# Patient Record
Sex: Male | Born: 1945
Health system: Southern US, Community
[De-identification: ages and names within clinical notes are randomized; demographics above are authoritative.]

## PROBLEM LIST (undated history)

## (undated) DIAGNOSIS — I10 Essential (primary) hypertension: Secondary | ICD-10-CM

## (undated) DIAGNOSIS — E782 Mixed hyperlipidemia: Secondary | ICD-10-CM

## (undated) DIAGNOSIS — M1990 Unspecified osteoarthritis, unspecified site: Secondary | ICD-10-CM

## (undated) DIAGNOSIS — G5603 Carpal tunnel syndrome, bilateral upper limbs: Secondary | ICD-10-CM

## (undated) DIAGNOSIS — J310 Chronic rhinitis: Secondary | ICD-10-CM

## (undated) DIAGNOSIS — Z9889 Other specified postprocedural states: Secondary | ICD-10-CM

## (undated) DIAGNOSIS — Z9989 Dependence on other enabling machines and devices: Secondary | ICD-10-CM

## (undated) DIAGNOSIS — J45909 Unspecified asthma, uncomplicated: Secondary | ICD-10-CM

## (undated) DIAGNOSIS — K5790 Diverticulosis of intestine, part unspecified, without perforation or abscess without bleeding: Secondary | ICD-10-CM

## (undated) DIAGNOSIS — L0390 Cellulitis, unspecified: Secondary | ICD-10-CM

## (undated) DIAGNOSIS — T783XXA Angioneurotic edema, initial encounter: Secondary | ICD-10-CM

## (undated) DIAGNOSIS — L409 Psoriasis, unspecified: Secondary | ICD-10-CM

## (undated) DIAGNOSIS — N183 Chronic kidney disease, stage 3 unspecified: Secondary | ICD-10-CM

## (undated) DIAGNOSIS — R972 Elevated prostate specific antigen [PSA]: Secondary | ICD-10-CM

## (undated) DIAGNOSIS — Z955 Presence of coronary angioplasty implant and graft: Secondary | ICD-10-CM

## (undated) DIAGNOSIS — C4490 Unspecified malignant neoplasm of skin, unspecified: Secondary | ICD-10-CM

## (undated) DIAGNOSIS — R3912 Poor urinary stream: Secondary | ICD-10-CM

## (undated) DIAGNOSIS — E785 Hyperlipidemia, unspecified: Secondary | ICD-10-CM

## (undated) DIAGNOSIS — E119 Type 2 diabetes mellitus without complications: Secondary | ICD-10-CM

## (undated) DIAGNOSIS — R600 Localized edema: Secondary | ICD-10-CM

## (undated) DIAGNOSIS — I878 Other specified disorders of veins: Secondary | ICD-10-CM

## (undated) DIAGNOSIS — G4733 Obstructive sleep apnea (adult) (pediatric): Secondary | ICD-10-CM

## (undated) DIAGNOSIS — L509 Urticaria, unspecified: Secondary | ICD-10-CM

## (undated) DIAGNOSIS — C4370 Malignant melanoma of unspecified lower limb, including hip: Secondary | ICD-10-CM

## (undated) DIAGNOSIS — C439 Malignant melanoma of skin, unspecified: Secondary | ICD-10-CM

## (undated) DIAGNOSIS — J3089 Other allergic rhinitis: Secondary | ICD-10-CM

## (undated) DIAGNOSIS — K5730 Diverticulosis of large intestine without perforation or abscess without bleeding: Secondary | ICD-10-CM

## (undated) DIAGNOSIS — E669 Obesity, unspecified: Secondary | ICD-10-CM

## (undated) DIAGNOSIS — I2510 Atherosclerotic heart disease of native coronary artery without angina pectoris: Secondary | ICD-10-CM

## (undated) DIAGNOSIS — G4730 Sleep apnea, unspecified: Secondary | ICD-10-CM

## (undated) DIAGNOSIS — N401 Enlarged prostate with lower urinary tract symptoms: Secondary | ICD-10-CM

## (undated) DIAGNOSIS — K52839 Microscopic colitis, unspecified: Secondary | ICD-10-CM

## (undated) DIAGNOSIS — K439 Ventral hernia without obstruction or gangrene: Secondary | ICD-10-CM

## (undated) DIAGNOSIS — E039 Hypothyroidism, unspecified: Secondary | ICD-10-CM

## (undated) DIAGNOSIS — R339 Retention of urine, unspecified: Secondary | ICD-10-CM

## (undated) DIAGNOSIS — J302 Other seasonal allergic rhinitis: Secondary | ICD-10-CM

## (undated) HISTORY — DX: Cellulitis, unspecified: L03.90

## (undated) HISTORY — DX: Sleep apnea, unspecified: G47.30

## (undated) HISTORY — DX: Atherosclerotic heart disease of native coronary artery without angina pectoris: I25.10

## (undated) HISTORY — DX: Other seasonal allergic rhinitis: J30.2

## (undated) HISTORY — PX: SINOSCOPY: SHX187

## (undated) HISTORY — PX: SHOULDER SURGERY: SHX246

## (undated) HISTORY — DX: Obstructive sleep apnea (adult) (pediatric): G47.33

## (undated) HISTORY — PX: NASAL SEPTUM SURGERY: SHX37

## (undated) HISTORY — PX: ADENOIDECTOMY: SUR15

## (undated) HISTORY — DX: Angioneurotic edema, initial encounter: T78.3XXA

## (undated) HISTORY — PX: TONSILLECTOMY AND ADENOIDECTOMY: SUR1326

## (undated) HISTORY — PX: KNEE SURGERY: SHX244

## (undated) HISTORY — DX: Hyperlipidemia, unspecified: E78.5

## (undated) HISTORY — DX: Other specified postprocedural states: Z98.890

## (undated) HISTORY — DX: Obesity, unspecified: E66.9

## (undated) HISTORY — PX: PROSTATE BIOPSY: SHX241

## (undated) HISTORY — PX: CORONARY ANGIOPLASTY: SHX604

## (undated) HISTORY — PX: PENILE PROSTHESIS IMPLANT: SHX240

## (undated) HISTORY — DX: Malignant melanoma of skin, unspecified: C43.9

## (undated) HISTORY — DX: Diverticulosis of intestine, part unspecified, without perforation or abscess without bleeding: K57.90

## (undated) HISTORY — DX: Essential (primary) hypertension: I10

## (undated) HISTORY — PX: APPENDECTOMY: SHX54

## (undated) HISTORY — PX: TYMPANOSTOMY TUBE PLACEMENT: SHX32

## (undated) HISTORY — DX: Urticaria, unspecified: L50.9

## (undated) HISTORY — DX: Unspecified asthma, uncomplicated: J45.909

## (undated) HISTORY — DX: Malignant melanoma of unspecified lower limb, including hip: C43.70

## (undated) HISTORY — DX: Psoriasis, unspecified: L40.9

## (undated) HISTORY — DX: Dependence on other enabling machines and devices: Z99.89

## (undated) HISTORY — DX: Microscopic colitis, unspecified: K52.839

## (undated) HISTORY — PX: TONSILLECTOMY: SUR1361

## (undated) HISTORY — PX: ABDOMINAL SURGERY: SHX537

## (undated) HISTORY — PX: KNEE ARTHROSCOPY: SUR90

---

## 1994-05-05 HISTORY — PX: PENILE PROSTHESIS IMPLANT: SHX240

## 1999-05-06 DIAGNOSIS — I252 Old myocardial infarction: Secondary | ICD-10-CM

## 1999-05-06 DIAGNOSIS — I2510 Atherosclerotic heart disease of native coronary artery without angina pectoris: Secondary | ICD-10-CM

## 1999-05-06 HISTORY — DX: Old myocardial infarction: I25.2

## 1999-05-06 HISTORY — DX: Atherosclerotic heart disease of native coronary artery without angina pectoris: I25.10

## 2000-03-05 ENCOUNTER — Ambulatory Visit (HOSPITAL_COMMUNITY): Admission: RE | Admit: 2000-03-05 | Discharge: 2000-03-06 | Payer: Self-pay | Admitting: Cardiovascular Disease

## 2000-03-05 HISTORY — PX: CORONARY ANGIOPLASTY WITH STENT PLACEMENT: SHX49

## 2000-09-08 ENCOUNTER — Ambulatory Visit (HOSPITAL_COMMUNITY): Admission: RE | Admit: 2000-09-08 | Discharge: 2000-09-08 | Payer: Self-pay | Admitting: Family Medicine

## 2000-09-08 ENCOUNTER — Encounter: Payer: Self-pay | Admitting: Family Medicine

## 2001-10-04 ENCOUNTER — Encounter: Payer: Self-pay | Admitting: Family Medicine

## 2001-10-04 ENCOUNTER — Ambulatory Visit (HOSPITAL_COMMUNITY): Admission: RE | Admit: 2001-10-04 | Discharge: 2001-10-04 | Payer: Self-pay | Admitting: Family Medicine

## 2001-10-16 ENCOUNTER — Emergency Department (HOSPITAL_COMMUNITY): Admission: EM | Admit: 2001-10-16 | Discharge: 2001-10-16 | Payer: Self-pay | Admitting: Emergency Medicine

## 2002-05-05 HISTORY — PX: SHOULDER SURGERY: SHX246

## 2002-05-08 ENCOUNTER — Emergency Department (HOSPITAL_COMMUNITY): Admission: EM | Admit: 2002-05-08 | Discharge: 2002-05-08 | Payer: Self-pay | Admitting: Emergency Medicine

## 2002-07-05 ENCOUNTER — Ambulatory Visit (HOSPITAL_COMMUNITY): Admission: RE | Admit: 2002-07-05 | Discharge: 2002-07-05 | Payer: Self-pay | Admitting: Family Medicine

## 2002-07-05 ENCOUNTER — Encounter: Payer: Self-pay | Admitting: Family Medicine

## 2002-08-12 ENCOUNTER — Ambulatory Visit (HOSPITAL_COMMUNITY): Admission: RE | Admit: 2002-08-12 | Discharge: 2002-08-12 | Payer: Self-pay | Admitting: Family Medicine

## 2002-08-12 ENCOUNTER — Encounter: Payer: Self-pay | Admitting: Family Medicine

## 2002-08-20 ENCOUNTER — Emergency Department (HOSPITAL_COMMUNITY): Admission: EM | Admit: 2002-08-20 | Discharge: 2002-08-20 | Payer: Self-pay | Admitting: Internal Medicine

## 2002-08-21 ENCOUNTER — Emergency Department (HOSPITAL_COMMUNITY): Admission: EM | Admit: 2002-08-21 | Discharge: 2002-08-21 | Payer: Self-pay | Admitting: Internal Medicine

## 2002-08-22 ENCOUNTER — Emergency Department (HOSPITAL_COMMUNITY): Admission: EM | Admit: 2002-08-22 | Discharge: 2002-08-22 | Payer: Self-pay | Admitting: Emergency Medicine

## 2002-09-02 ENCOUNTER — Encounter: Payer: Self-pay | Admitting: Family Medicine

## 2002-09-02 ENCOUNTER — Ambulatory Visit (HOSPITAL_COMMUNITY): Admission: RE | Admit: 2002-09-02 | Discharge: 2002-09-02 | Payer: Self-pay | Admitting: Family Medicine

## 2002-09-04 ENCOUNTER — Emergency Department (HOSPITAL_COMMUNITY): Admission: EM | Admit: 2002-09-04 | Discharge: 2002-09-04 | Payer: Self-pay | Admitting: Emergency Medicine

## 2002-09-06 ENCOUNTER — Inpatient Hospital Stay (HOSPITAL_COMMUNITY): Admission: RE | Admit: 2002-09-06 | Discharge: 2002-09-12 | Payer: Self-pay | Admitting: General Surgery

## 2002-09-13 ENCOUNTER — Ambulatory Visit (HOSPITAL_COMMUNITY): Admission: RE | Admit: 2002-09-13 | Discharge: 2002-09-13 | Payer: Self-pay | Admitting: Internal Medicine

## 2002-09-13 ENCOUNTER — Encounter (HOSPITAL_COMMUNITY): Admission: RE | Admit: 2002-09-13 | Discharge: 2002-10-13 | Payer: Self-pay | Admitting: General Surgery

## 2002-10-14 ENCOUNTER — Encounter (HOSPITAL_COMMUNITY): Admission: RE | Admit: 2002-10-14 | Discharge: 2002-11-13 | Payer: Self-pay | Admitting: General Surgery

## 2002-10-18 ENCOUNTER — Encounter: Payer: Self-pay | Admitting: General Surgery

## 2002-10-18 ENCOUNTER — Ambulatory Visit (HOSPITAL_COMMUNITY): Admission: RE | Admit: 2002-10-18 | Discharge: 2002-10-18 | Payer: Self-pay | Admitting: General Surgery

## 2002-11-24 ENCOUNTER — Encounter: Payer: Self-pay | Admitting: Family Medicine

## 2002-11-24 ENCOUNTER — Ambulatory Visit (HOSPITAL_COMMUNITY): Admission: RE | Admit: 2002-11-24 | Discharge: 2002-11-24 | Payer: Self-pay | Admitting: Family Medicine

## 2002-12-13 ENCOUNTER — Ambulatory Visit (HOSPITAL_COMMUNITY): Admission: RE | Admit: 2002-12-13 | Discharge: 2002-12-13 | Payer: Self-pay | Admitting: Orthopedic Surgery

## 2002-12-13 ENCOUNTER — Encounter: Payer: Self-pay | Admitting: Orthopedic Surgery

## 2003-02-14 ENCOUNTER — Ambulatory Visit (HOSPITAL_COMMUNITY): Admission: RE | Admit: 2003-02-14 | Discharge: 2003-02-14 | Payer: Self-pay | Admitting: Orthopedic Surgery

## 2003-02-20 ENCOUNTER — Encounter (HOSPITAL_COMMUNITY): Admission: RE | Admit: 2003-02-20 | Discharge: 2003-03-22 | Payer: Self-pay | Admitting: Orthopedic Surgery

## 2003-04-05 HISTORY — PX: CORONARY ANGIOPLASTY WITH STENT PLACEMENT: SHX49

## 2003-04-20 ENCOUNTER — Encounter: Payer: Self-pay | Admitting: Emergency Medicine

## 2003-04-20 ENCOUNTER — Inpatient Hospital Stay (HOSPITAL_COMMUNITY): Admission: AD | Admit: 2003-04-20 | Discharge: 2003-04-25 | Payer: Self-pay | Admitting: Cardiology

## 2003-07-03 ENCOUNTER — Ambulatory Visit (HOSPITAL_COMMUNITY): Admission: RE | Admit: 2003-07-03 | Discharge: 2003-07-03 | Payer: Self-pay | Admitting: Internal Medicine

## 2003-07-03 DIAGNOSIS — Z9889 Other specified postprocedural states: Secondary | ICD-10-CM

## 2003-07-03 HISTORY — DX: Other specified postprocedural states: Z98.890

## 2004-08-01 ENCOUNTER — Ambulatory Visit: Payer: Self-pay | Admitting: *Deleted

## 2004-08-15 ENCOUNTER — Ambulatory Visit: Payer: Self-pay | Admitting: *Deleted

## 2004-08-25 ENCOUNTER — Emergency Department (HOSPITAL_COMMUNITY): Admission: EM | Admit: 2004-08-25 | Discharge: 2004-08-25 | Payer: Self-pay | Admitting: Emergency Medicine

## 2004-08-29 ENCOUNTER — Ambulatory Visit: Payer: Self-pay | Admitting: Cardiology

## 2004-08-29 ENCOUNTER — Encounter (HOSPITAL_COMMUNITY): Admission: RE | Admit: 2004-08-29 | Discharge: 2004-08-30 | Payer: Self-pay | Admitting: *Deleted

## 2004-09-04 ENCOUNTER — Ambulatory Visit: Payer: Self-pay | Admitting: *Deleted

## 2004-09-13 ENCOUNTER — Observation Stay (HOSPITAL_COMMUNITY): Admission: RE | Admit: 2004-09-13 | Discharge: 2004-09-13 | Payer: Self-pay | Admitting: Specialist

## 2005-05-05 HISTORY — PX: CATARACT EXTRACTION W/ INTRAOCULAR LENS IMPLANT: SHX1309

## 2005-10-11 ENCOUNTER — Emergency Department (HOSPITAL_COMMUNITY): Admission: EM | Admit: 2005-10-11 | Discharge: 2005-10-11 | Payer: Self-pay | Admitting: Emergency Medicine

## 2006-06-10 ENCOUNTER — Ambulatory Visit (HOSPITAL_COMMUNITY): Admission: RE | Admit: 2006-06-10 | Discharge: 2006-06-10 | Payer: Self-pay | Admitting: Internal Medicine

## 2006-09-18 ENCOUNTER — Observation Stay (HOSPITAL_COMMUNITY): Admission: RE | Admit: 2006-09-18 | Discharge: 2006-09-19 | Payer: Self-pay | Admitting: Otolaryngology

## 2006-09-18 ENCOUNTER — Encounter (INDEPENDENT_AMBULATORY_CARE_PROVIDER_SITE_OTHER): Payer: Self-pay | Admitting: Otolaryngology

## 2006-09-18 HISTORY — PX: NASAL/SINUS ENDOSCOPY: SHX288

## 2006-09-20 ENCOUNTER — Emergency Department (HOSPITAL_COMMUNITY): Admission: EM | Admit: 2006-09-20 | Discharge: 2006-09-20 | Payer: Self-pay | Admitting: Emergency Medicine

## 2007-05-06 HISTORY — PX: LAPAROSCOPIC GASTRIC BANDING: SHX1100

## 2007-06-11 ENCOUNTER — Ambulatory Visit (HOSPITAL_COMMUNITY): Admission: RE | Admit: 2007-06-11 | Discharge: 2007-06-12 | Payer: Self-pay | Admitting: Cardiology

## 2007-06-11 HISTORY — PX: CORONARY ANGIOPLASTY WITH STENT PLACEMENT: SHX49

## 2008-08-09 ENCOUNTER — Ambulatory Visit (HOSPITAL_COMMUNITY): Admission: RE | Admit: 2008-08-09 | Discharge: 2008-08-09 | Payer: Self-pay | Admitting: Family Medicine

## 2008-08-13 ENCOUNTER — Emergency Department (HOSPITAL_COMMUNITY): Admission: EM | Admit: 2008-08-13 | Discharge: 2008-08-14 | Payer: Self-pay | Admitting: Emergency Medicine

## 2008-09-12 ENCOUNTER — Encounter (INDEPENDENT_AMBULATORY_CARE_PROVIDER_SITE_OTHER): Payer: Self-pay | Admitting: *Deleted

## 2009-08-01 ENCOUNTER — Ambulatory Visit (HOSPITAL_COMMUNITY): Admission: RE | Admit: 2009-08-01 | Discharge: 2009-08-01 | Payer: Self-pay | Admitting: Internal Medicine

## 2009-09-02 DIAGNOSIS — L0390 Cellulitis, unspecified: Secondary | ICD-10-CM

## 2009-09-02 HISTORY — DX: Cellulitis, unspecified: L03.90

## 2009-09-24 ENCOUNTER — Telehealth (INDEPENDENT_AMBULATORY_CARE_PROVIDER_SITE_OTHER): Payer: Self-pay

## 2009-10-02 ENCOUNTER — Encounter (INDEPENDENT_AMBULATORY_CARE_PROVIDER_SITE_OTHER): Payer: Self-pay | Admitting: Internal Medicine

## 2009-10-03 ENCOUNTER — Encounter (INDEPENDENT_AMBULATORY_CARE_PROVIDER_SITE_OTHER): Payer: Self-pay | Admitting: Internal Medicine

## 2009-10-03 ENCOUNTER — Ambulatory Visit: Payer: Self-pay | Admitting: Vascular Surgery

## 2010-02-02 DIAGNOSIS — Z85820 Personal history of malignant melanoma of skin: Secondary | ICD-10-CM

## 2010-02-02 DIAGNOSIS — C439 Malignant melanoma of skin, unspecified: Secondary | ICD-10-CM

## 2010-02-02 HISTORY — DX: Malignant melanoma of skin, unspecified: C43.9

## 2010-02-02 HISTORY — DX: Personal history of malignant melanoma of skin: Z85.820

## 2010-02-18 ENCOUNTER — Ambulatory Visit (HOSPITAL_COMMUNITY): Admission: RE | Admit: 2010-02-18 | Discharge: 2010-02-18 | Payer: Self-pay | Admitting: General Surgery

## 2010-02-18 HISTORY — PX: MELANOMA EXCISION: SHX5266

## 2010-04-10 ENCOUNTER — Ambulatory Visit: Payer: Self-pay | Admitting: Hematology & Oncology

## 2010-04-11 ENCOUNTER — Inpatient Hospital Stay (HOSPITAL_COMMUNITY): Admission: EM | Admit: 2010-04-11 | Discharge: 2009-10-05 | Payer: Self-pay | Admitting: Emergency Medicine

## 2010-05-15 ENCOUNTER — Ambulatory Visit: Payer: Self-pay | Admitting: Hematology & Oncology

## 2010-05-17 LAB — COMPREHENSIVE METABOLIC PANEL
ALT: 19 U/L (ref 0–53)
AST: 19 U/L (ref 0–37)
Albumin: 5 g/dL (ref 3.5–5.2)
Alkaline Phosphatase: 46 U/L (ref 39–117)
BUN: 18 mg/dL (ref 6–23)
CO2: 25 mEq/L (ref 19–32)
Calcium: 9.9 mg/dL (ref 8.4–10.5)
Chloride: 101 mEq/L (ref 96–112)
Creatinine, Ser: 1.24 mg/dL (ref 0.40–1.50)
Glucose, Bld: 145 mg/dL — ABNORMAL HIGH (ref 70–99)
Potassium: 3.9 mEq/L (ref 3.5–5.3)
Sodium: 142 mEq/L (ref 135–145)
Total Bilirubin: 0.5 mg/dL (ref 0.3–1.2)
Total Protein: 7.1 g/dL (ref 6.0–8.3)

## 2010-05-17 LAB — LACTATE DEHYDROGENASE: LDH: 160 U/L (ref 94–250)

## 2010-06-06 NOTE — Progress Notes (Signed)
Summary: refills/needs appt.  Phone Note Outgoing Call   Call placed by: Larita Fife Via LPN,  Sep 24, 2009 2:04 PM Summary of Call: We received refill request from [facility] for Niaspan 1000mg  (take 2 tablets po qhs). Pt. has not been seen here since 2006. LMOM for pt. to call office.  Follow-up for Phone Call        [facility]. Follow-up by: Larita Fife Via LPN,  Sep 25, 2009 8:54 AM    Additional Follow-up for Phone Call Additional follow up Details #2::    Pharmacist at [facility] Homero Fellers, advised that we cant refill pt's meds until he has an OV. Pharmacy will advise pt. when he comes to pick RX up. Follow-up by: Larita Fife Via LPN,  Sep 26, 2009 9:17 AM

## 2010-06-13 ENCOUNTER — Ambulatory Visit (HOSPITAL_COMMUNITY)
Admission: RE | Admit: 2010-06-13 | Discharge: 2010-06-13 | Disposition: A | Discharge status: Home / Self Care | Payer: BC Managed Care – PPO | Source: Ambulatory Visit | Attending: Cardiology | Admitting: Cardiology

## 2010-06-13 ENCOUNTER — Other Ambulatory Visit (HOSPITAL_COMMUNITY): Payer: Self-pay | Admitting: Cardiology

## 2010-06-13 DIAGNOSIS — M79609 Pain in unspecified limb: Secondary | ICD-10-CM | POA: Insufficient documentation

## 2010-06-13 DIAGNOSIS — L539 Erythematous condition, unspecified: Secondary | ICD-10-CM

## 2010-06-13 DIAGNOSIS — R609 Edema, unspecified: Secondary | ICD-10-CM

## 2010-06-13 DIAGNOSIS — M7989 Other specified soft tissue disorders: Secondary | ICD-10-CM | POA: Insufficient documentation

## 2010-06-13 DIAGNOSIS — R52 Pain, unspecified: Secondary | ICD-10-CM

## 2010-07-17 LAB — BASIC METABOLIC PANEL
BUN: 16 mg/dL (ref 6–23)
CO2: 29 mEq/L (ref 19–32)
Chloride: 104 mEq/L (ref 96–112)
GFR calc Af Amer: >60 mL/min (ref >60)
Potassium: 3.8 mEq/L (ref 3.5–5.1)
Sodium: 142 mEq/L (ref 135–145)

## 2010-07-17 LAB — GLUCOSE, CAPILLARY
Glucose-Capillary: 165 mg/dL — ABNORMAL HIGH (ref 70–99)
Glucose-Capillary: 169 mg/dL — ABNORMAL HIGH (ref 70–99)

## 2010-07-17 LAB — CBC
HCT: 37.8 % — ABNORMAL LOW (ref 39.0–52.0)
MCV: 94.5 fL (ref 78.0–100.0)
Platelets: 186 10*3/uL (ref 150–400)

## 2010-07-17 LAB — SURGICAL PCR SCREEN: MRSA, PCR: NEGATIVE

## 2010-07-22 ENCOUNTER — Emergency Department (HOSPITAL_COMMUNITY)
Admission: EM | Admit: 2010-07-22 | Discharge: 2010-07-23 | Disposition: A | Discharge status: Home / Self Care | Payer: BC Managed Care – PPO | Attending: Emergency Medicine | Admitting: Emergency Medicine

## 2010-07-22 ENCOUNTER — Emergency Department (HOSPITAL_COMMUNITY): Payer: BC Managed Care – PPO

## 2010-07-22 DIAGNOSIS — Z79899 Other long term (current) drug therapy: Secondary | ICD-10-CM | POA: Insufficient documentation

## 2010-07-22 DIAGNOSIS — E119 Type 2 diabetes mellitus without complications: Secondary | ICD-10-CM | POA: Insufficient documentation

## 2010-07-22 DIAGNOSIS — J45909 Unspecified asthma, uncomplicated: Secondary | ICD-10-CM | POA: Insufficient documentation

## 2010-07-22 DIAGNOSIS — I10 Essential (primary) hypertension: Secondary | ICD-10-CM | POA: Insufficient documentation

## 2010-07-22 DIAGNOSIS — I2510 Atherosclerotic heart disease of native coronary artery without angina pectoris: Secondary | ICD-10-CM | POA: Insufficient documentation

## 2010-07-22 DIAGNOSIS — R112 Nausea with vomiting, unspecified: Secondary | ICD-10-CM | POA: Insufficient documentation

## 2010-07-22 DIAGNOSIS — R109 Unspecified abdominal pain: Secondary | ICD-10-CM | POA: Insufficient documentation

## 2010-07-22 LAB — CBC
HCT: 32.1 % — ABNORMAL LOW (ref 39.0–52.0)
HCT: 37.2 % — ABNORMAL LOW (ref 39.0–52.0)
HCT: 37 % — ABNORMAL LOW (ref 39.0–52.0)
HCT: 39 % (ref 39.0–52.0)
HCT: 39.3 % (ref 39.0–52.0)
Hemoglobin: 11 g/dL — ABNORMAL LOW (ref 13.0–17.0)
Hemoglobin: 12.6 g/dL — ABNORMAL LOW (ref 13.0–17.0)
Hemoglobin: 13.6 g/dL (ref 13.0–17.0)
Hemoglobin: 14.8 g/dL (ref 13.0–17.0)
Hemoglobin: 15.7 g/dL (ref 13.0–17.0)
MCH: 33 pg (ref 26.0–34.0)
MCHC: 34.2 g/dL (ref 30.0–36.0)
MCHC: 34.4 g/dL (ref 30.0–36.0)
MCHC: 34.6 g/dL (ref 30.0–36.0)
MCV: 97.6 fL (ref 78.0–100.0)
MCV: 97.6 fL (ref 78.0–100.0)
MCV: 98.4 fL (ref 78.0–100.0)
MCV: 97.8 fL (ref 78.0–100.0)
MCV: 95.2 fL (ref 78.0–100.0)
Platelets: 178 10*3/uL (ref 150–400)
Platelets: 190 10*3/uL (ref 150–400)
Platelets: 143 10*3/uL — ABNORMAL LOW (ref 150–400)
Platelets: 157 10*3/uL (ref 150–400)
Platelets: 161 10*3/uL (ref 150–400)
RBC: 3.29 MIL/uL — ABNORMAL LOW (ref 4.22–5.81)
RBC: 3.79 MIL/uL — ABNORMAL LOW (ref 4.22–5.81)
RBC: 4.76 MIL/uL (ref 4.22–5.81)
RDW: 14.1 % (ref 11.5–15.5)
RDW: 13.3 % (ref 11.5–15.5)
RDW: 13.6 % (ref 11.5–15.5)
RDW: 14 % (ref 11.5–15.5)
WBC: 7.9 10*3/uL (ref 4.0–10.5)
WBC: 9.1 10*3/uL (ref 4.0–10.5)
WBC: 8.3 10*3/uL (ref 4.0–10.5)
WBC: 6.7 10*3/uL (ref 4.0–10.5)

## 2010-07-22 LAB — MRSA PCR SCREENING: MRSA by PCR: POSITIVE — AB

## 2010-07-22 LAB — GLUCOSE, CAPILLARY
Glucose-Capillary: 200 mg/dL — ABNORMAL HIGH (ref 70–99)
Glucose-Capillary: 209 mg/dL — ABNORMAL HIGH (ref 70–99)
Glucose-Capillary: 210 mg/dL — ABNORMAL HIGH (ref 70–99)
Glucose-Capillary: 217 mg/dL — ABNORMAL HIGH (ref 70–99)
Glucose-Capillary: 250 mg/dL — ABNORMAL HIGH (ref 70–99)
Glucose-Capillary: 251 mg/dL — ABNORMAL HIGH (ref 70–99)
Glucose-Capillary: 311 mg/dL — ABNORMAL HIGH (ref 70–99)
Glucose-Capillary: 342 mg/dL — ABNORMAL HIGH (ref 70–99)
Glucose-Capillary: 201 mg/dL — ABNORMAL HIGH (ref 70–99)
Glucose-Capillary: 206 mg/dL — ABNORMAL HIGH (ref 70–99)
Glucose-Capillary: 208 mg/dL — ABNORMAL HIGH (ref 70–99)
Glucose-Capillary: 244 mg/dL — ABNORMAL HIGH (ref 70–99)
Glucose-Capillary: 362 mg/dL — ABNORMAL HIGH (ref 70–99)

## 2010-07-22 LAB — COMPREHENSIVE METABOLIC PANEL
ALT: 27 U/L (ref 0–53)
AST: 22 U/L (ref 0–37)
AST: 27 U/L (ref 0–37)
AST: 22 U/L (ref 0–37)
Albumin: 2.6 g/dL — ABNORMAL LOW (ref 3.5–5.2)
Albumin: 4 g/dL (ref 3.5–5.2)
Alkaline Phosphatase: 50 U/L (ref 39–117)
Alkaline Phosphatase: 46 U/L (ref 39–117)
BUN: 25 mg/dL — ABNORMAL HIGH (ref 6–23)
CO2: 31 mEq/L (ref 19–32)
CO2: 23 mEq/L (ref 19–32)
Calcium: 8.8 mg/dL (ref 8.4–10.5)
Calcium: 9.5 mg/dL (ref 8.4–10.5)
Chloride: 100 mEq/L (ref 96–112)
Chloride: 101 mEq/L (ref 96–112)
Creatinine, Ser: 1.07 mg/dL (ref 0.4–1.5)
Creatinine, Ser: 1.12 mg/dL (ref 0.4–1.5)
Creatinine, Ser: 1.39 mg/dL (ref 0.4–1.5)
GFR calc Af Amer: >60 mL/min (ref >60)
GFR calc Af Amer: >60 mL/min (ref >60)
GFR calc Af Amer: >60 mL/min (ref >60)
GFR calc non Af Amer: >60 mL/min (ref >60)
GFR calc non Af Amer: 52 mL/min — ABNORMAL LOW (ref >60)
Glucose, Bld: 228 mg/dL — ABNORMAL HIGH (ref 70–99)
Potassium: 3.7 mEq/L (ref 3.5–5.1)
Potassium: 4 mEq/L (ref 3.5–5.1)
Potassium: 4.2 mEq/L (ref 3.5–5.1)
Sodium: 137 mEq/L (ref 135–145)
Total Bilirubin: 0.9 mg/dL (ref 0.3–1.2)
Total Bilirubin: 1 mg/dL (ref 0.3–1.2)
Total Protein: 6.7 g/dL (ref 6.0–8.3)

## 2010-07-22 LAB — URINALYSIS, ROUTINE W REFLEX MICROSCOPIC
Bilirubin Urine: NEGATIVE
Bilirubin Urine: NEGATIVE
Glucose, UA: 500 mg/dL — AB
Glucose, UA: 500 mg/dL — AB
Glucose, UA: >1000 mg/dL — AB
Hgb urine dipstick: NEGATIVE
Ketones, ur: NEGATIVE mg/dL
Nitrite: NEGATIVE
Protein, ur: NEGATIVE mg/dL
Protein, ur: NEGATIVE mg/dL
pH: 5 (ref 5.0–8.0)
pH: 5 (ref 5.0–8.0)
pH: 5.5 (ref 5.0–8.0)

## 2010-07-22 LAB — BASIC METABOLIC PANEL
BUN: 12 mg/dL (ref 6–23)
BUN: 12 mg/dL (ref 6–23)
CO2: 29 mEq/L (ref 19–32)
Calcium: 8.7 mg/dL (ref 8.4–10.5)
Chloride: 98 mEq/L (ref 96–112)
Chloride: 98 mEq/L (ref 96–112)
GFR calc Af Amer: >60 mL/min (ref >60)
GFR calc non Af Amer: 57 mL/min — ABNORMAL LOW (ref >60)
GFR calc non Af Amer: >60 mL/min (ref >60)
Glucose, Bld: 289 mg/dL — ABNORMAL HIGH (ref 70–99)
Glucose, Bld: 219 mg/dL — ABNORMAL HIGH (ref 70–99)
Potassium: 3.6 mEq/L (ref 3.5–5.1)
Potassium: 3.5 mEq/L (ref 3.5–5.1)
Potassium: 3.5 mEq/L (ref 3.5–5.1)
Sodium: 136 mEq/L (ref 135–145)
Sodium: 136 mEq/L (ref 135–145)

## 2010-07-22 LAB — DIFFERENTIAL
Basophils Absolute: 0 10*3/uL (ref 0.0–0.1)
Basophils Absolute: 0 10*3/uL (ref 0.0–0.1)
Basophils Absolute: 0 10*3/uL (ref 0.0–0.1)
Basophils Relative: 0 % (ref 0–1)
Basophils Relative: 0 % (ref 0–1)
Eosinophils Absolute: 0.1 10*3/uL (ref 0.0–0.7)
Eosinophils Absolute: 0 10*3/uL (ref 0.0–0.7)
Eosinophils Relative: 2 % (ref 0–5)
Eosinophils Relative: 0 % (ref 0–5)
Eosinophils Relative: 1 % (ref 0–5)
Lymphocytes Relative: 13 % (ref 12–46)
Lymphocytes Relative: 15 % (ref 12–46)
Lymphocytes Relative: 10 % — ABNORMAL LOW (ref 12–46)
Lymphocytes Relative: 11 % — ABNORMAL LOW (ref 12–46)
Lymphocytes Relative: 3 % — ABNORMAL LOW (ref 12–46)
Lymphs Abs: 1 10*3/uL (ref 0.7–4.0)
Lymphs Abs: 1 10*3/uL (ref 0.7–4.0)
Lymphs Abs: 0.5 10*3/uL — ABNORMAL LOW (ref 0.7–4.0)
Lymphs Abs: 0.9 10*3/uL (ref 0.7–4.0)
Lymphs Abs: 0.2 10*3/uL — ABNORMAL LOW (ref 0.7–4.0)
Monocytes Absolute: 0.8 10*3/uL (ref 0.1–1.0)
Monocytes Absolute: 0.5 10*3/uL (ref 0.1–1.0)
Monocytes Absolute: 0.6 10*3/uL (ref 0.1–1.0)
Monocytes Relative: 6 % (ref 3–12)
Monocytes Relative: 5 % (ref 3–12)
Neutro Abs: 6.6 10*3/uL (ref 1.7–7.7)
Neutro Abs: 6.1 10*3/uL (ref 1.7–7.7)
Neutrophils Relative %: 75 % (ref 43–77)
Neutrophils Relative %: 79 % — ABNORMAL HIGH (ref 43–77)
Neutrophils Relative %: 91 % — ABNORMAL HIGH (ref 43–77)

## 2010-07-22 LAB — POCT CARDIAC MARKERS
CKMB, poc: <1 ng/mL — ABNORMAL LOW (ref 1.0–8.0)
CKMB, poc: 1.5 ng/mL (ref 1.0–8.0)
CKMB, poc: <1 ng/mL — ABNORMAL LOW (ref 1.0–8.0)
Myoglobin, poc: 242 ng/mL (ref 12–200)
Troponin i, poc: <0.05 ng/mL (ref 0.00–0.09)

## 2010-07-22 LAB — EHRLICHIA ANTIBODY PANEL
E chaffeensis (HGE) Ab, IgG: <1:64 {titer}
E chaffeensis (HGE) Ab, IgM: <1:20 {titer}

## 2010-07-22 LAB — URINE MICROSCOPIC-ADD ON

## 2010-07-22 LAB — CULTURE, BLOOD (ROUTINE X 2): Culture: NO GROWTH

## 2010-07-22 LAB — CARDIAC PANEL(CRET KIN+CKTOT+MB+TROPI)
CK, MB: 0.9 ng/mL (ref 0.3–4.0)
CK, MB: 1 ng/mL (ref 0.3–4.0)
Relative Index: INVALID (ref 0.0–2.5)
Total CK: 74 U/L (ref 7–232)
Troponin I: <0.01 ng/mL (ref 0.00–0.06)
Troponin I: <0.01 ng/mL (ref 0.00–0.06)

## 2010-07-22 LAB — MAGNESIUM: Magnesium: 2 mg/dL (ref 1.5–2.5)

## 2010-07-22 LAB — URINE CULTURE
Colony Count: 6000
Colony Count: NO GROWTH
Culture: NO GROWTH

## 2010-07-22 LAB — B. BURGDORFI ANTIBODIES: B burgdorferi Ab IgG+IgM: 0.223 {ISR}

## 2010-07-22 LAB — TSH: TSH: 0.524 u[IU]/mL (ref 0.350–4.500)

## 2010-07-22 LAB — LACTIC ACID, PLASMA: Lactic Acid, Venous: 3.1 mmol/L — ABNORMAL HIGH (ref 0.5–2.2)

## 2010-07-23 ENCOUNTER — Emergency Department (HOSPITAL_COMMUNITY): Payer: BC Managed Care – PPO

## 2010-08-14 LAB — RAPID STREP SCREEN (MED CTR MEBANE ONLY): Streptococcus, Group A Screen (Direct): NEGATIVE

## 2010-09-06 ENCOUNTER — Encounter (HOSPITAL_BASED_OUTPATIENT_CLINIC_OR_DEPARTMENT_OTHER): Payer: BC Managed Care – PPO | Admitting: Hematology & Oncology

## 2010-09-06 DIAGNOSIS — C4370 Malignant melanoma of unspecified lower limb, including hip: Secondary | ICD-10-CM

## 2010-09-17 NOTE — Cardiovascular Report (Signed)
NAMEARMOND, CUTHRELL              ACCOUNT NO.:  [id number]   MEDICAL RECORD NO.:  [id number]          PATIENT TYPE:  OIB   LOCATION:  2899                         FACILITY:  [facility]   PHYSICIAN:  Cristy Hilts. Jacinto Halim, MD       DATE OF BIRTH:  07-Jan-1946   DATE OF PROCEDURE:  06/11/2007  DATE OF DISCHARGE:                            CARDIAC CATHETERIZATION   PROCEDURE PERFORMED:  1. PTCA and stenting of the distal dominant circumflex coronary artery      with a 4.0 x 18 mm driver.  2. PTCA and balloon angioplasty of in-stent restenotic 3.5 x 12 mm      Taxus stent that was placed on December of 2004 in the mid      circumflex coronary artery.   INDICATIONS:  Mr. Kevin [name] is a 61-year gentleman with known  coronary artery disease who has had an anterior wall myocardial  infarction in 2001 status post stenting with a 4.0 x 15 mm non DES stent  in the proximal LAD.  Because of increasing chest pain and angina  pectoris and abnormal stress test revealing increasing inferolateral  ischemia, he underwent diagnostic cardiac catheterization at the  [facility] earlier this morning and was found to have a  high-grade stenosis with a distal dominant circumflex coronary artery.  He was transferred to [facility] for elective PTCA and probable  stenting of the distal circumflex coronary artery.   ANGIOGRAPHIC DATA:  Please see the complete dictation from [facility] done by me earlier today.  Briefly, the right coronary  artery is a dominant vessel.  However, it is very small and gives very  small PDA and PLA branches.  There is a proximal to mid segment 80%  stenosis which is new compared to prior cardiac catheterization done  about 6-7 months ago.  However, this vessel is small.  Hence, medical  therapy is indicated.   The left main is short and bifurcates immediately.   Circumflex coronary artery - the circumflex is a large vessel which  gives origin to the  large obtuse marginal one.  There is a stent in the  mid segment of the mid body of the circumflex coronary artery which  shows an in-stent 50-60% stenosis.  Followed by this distally prior to  the bifurcation of PDA and PLA branch, there is a focal 95% stenosis.   LAD is a large vessel.  The previously placed stent in the proximal LAD  is widely patent with mild luminal irregularity constituting 10-20%  stenosis.  The stent gel diagonal is about 70-80% stenosed, unchanged  from prior cardiac catheterization.   INTERVENTION DATA:  1. Successful PTCA and stenting of the distal circumflex coronary      artery with implantation of a 4.0 x 18 mm driver (non drug-eluting)      stent.  The stenosis was reduced from 95% to 0%.  2. Successful PTCA and balloon angioplasty of the in-stent restenotic      mid circumflex coronary artery stent which was a 3.5 x 12-mm TAXUS.  This was dilated with a 4.0 x 18 mm stent driver stent balloon at      10 atmospheres of pressure.  There was a significant wait noted      with balloon inflation.  However, I was able to get adequate      expansion at 10 atmospheres of pressure.  Overall, the stenosis was      reduced from 50-60% to 0%.  TIMI III flow was noted.   RECOMMENDATIONS:  The patient will be continued on aspirin and Plavix  for at least a period of 4-6 weeks or until his surgery for a lap band  is to be done.  Then he can come off of his of Plavix.  Although he has  had a drug-eluting stent placed in December of 2004, and this is  probably well endotheliazed, and presently the patient has not been on  Plavix for a while.  I do not suspect he will have any significant  subacute stent thrombosis issues, and the drug is probably fully eluded  and is endothelialized.  Hence, will continue Plavix at least for a  period of 6 weeks.  If necessary, will continue the Plavix until his  surgery, and then we can wean him off of Plavix for a period of 2  weeks  prior to surgery.   TECHNICAL PROCEDURE:  Using sterile precautionand a 7-French right  femoral arterial access and 7-French FL-4.5 guide was advanced to the  ascending aorta after using Angiomax for anticoagulation.  Then a 190 cm  x 0.014 of an inch ATW marker guidewire was advanced into the circumflex  coronary artery.  A 4.0 x 90 mm Maverick balloon was utilized and  multiple balloon inflations were performed within the high-grade  stenosis at anywhere from 3 to 8 atmospheres pressure for 20-30 seconds  each.  There was still residual 50% stenosis following balloon  angioplasty; hence, I stented this with a 4.0 x 18 mm driver at 10  atmospheres of pressure for 50 seconds.  Post stent implantation  angiography revealed excellent results.  The same stent balloon was  utilized to perform balloon angioplasty at the in-stent restenotic  segment of the mid segment of the circumflex coronary artery.  Post  angioplasty angiography revealed excellent results.  The guidewire was  withdrawn guide, angiography repeated, and guide catheter disengaged and  pulled out of the body.  The patient tolerated the procedure.  No  immediate complications were noted.      Cristy Hilts. Jacinto Halim, MD  Electronically Signed     JRG/MEDQ  D:  06/11/2007  T:  06/14/2007  Job:  [id number]   cc:   Dani Gobble, MD  Madelin Rear. Sherwood Gambler, MD

## 2010-09-17 NOTE — Op Note (Signed)
[name], [name]                 ACCOUNT NO.:  [id number]   MEDICAL RECORD NO.:  [id number]          PATIENT TYPE:  OBV   LOCATION:  2550                         FACILITY:  [facility]   PHYSICIAN:  Kevin [name], M.D.DATE OF BIRTH:  03/18/1946   DATE OF PROCEDURE:  09/18/2006  DATE OF DISCHARGE:                               OPERATIVE REPORT   PREOPERATIVE DIAGNOSES:  1. Nasal septal deviation after prior nasal septoplasty.  2. Inferior turbinate reduction.  3. Chronic left sinusitis with chronic periorbital headache.  4. History of obstructive sleep apnea.   POSTOPERATIVE DIAGNOSES:  1. Nasal septal deviation after prior nasal septoplasty.  2. Inferior turbinate reduction.  3. Chronic left sinusitis with chronic periorbital headache.  4. History of obstructive sleep apnea.   INDICATIONS FOR PROCEDURE:  1. Nasal septal deviation after prior nasal septoplasty.  2. Inferior turbinate reduction.  3. Chronic left sinusitis with chronic periorbital headache.  4. History of obstructive sleep apnea.   SURGICAL PROCEDURES:  1. Left endoscopic sinus surgery with computer-assisted navigation      (Stealth guidance) consisting of left total ethmoidectomy, nasal      frontal recess exploration and maxillary antrostomy with removal of      diseased tissue.  2. Revision nasal septoplasty.  3. Bilateral inferior turbinate reduction.   SURGEON:  Kevin [name], M.D.   ANESTHESIA:  General endotracheal.   COMPLICATIONS:  None.   BLOOD LOSS:  Approximately 150 mL.   SPECIMENS:  Sent to path for gross microscopic evaluation.   The patient was transferred from the operating room to the recovery in  stable condition.   COMPLICATIONS:  None.   BRIEF HISTORY:  Kevin [name] is a 65 year old white male who was referred  for evaluation of chronic left periorbital headache and sinusitis.  The  patient had been treated with numerous prior antibiotics and CT scanning  showed an  opacified ethmoid air cell with obstruction of the nasal  frontal recess.  There was no significant improvement in symptoms or  findings after aggressive antibiotic and steroid therapy.  Repeat CT  scanning showed a similar finding with an opacified mid ethmoid air  cell, no evidence of bone erosion or other infection.  The patient had  minimal polypoid disease in the ethmoid sinus with partial obstruction  of the ostiomeatal complex and nasal frontal recess.  Given the  patient's history and examination, we discussed various treatment  options.  We monitored the patient's symptoms over a 2-3 month period of  time and he had no resolution, continued have severe chronic headaches  and given his history, I recommended that we undertake left limited  endoscopic sinus surgery consisting of total ethmoidectomy, maxillary  antrostomy and nasal frontal recess exploration with removal of the  opacified air cell.  The patient also had a deviated nasal septum with  septal spurring after prior nasal septoplasty.  The patient has a  history of obstructive sleep apnea, has difficulty wearing CPAP because  of nasal airway obstruction.  He has inferior turbinate hypertrophy and  given the above history and findings, I  recommended endoscopic sinus  surgery, revision nasal septoplasty and inferior turbinate reduction.  The risks, benefits and possible complications of these procedures were  discussed in detail with the patient who understood and concurred with  our plan which was scheduled for Sep 18, 2006.  Prior to surgery, a  Stealth formatted CT scan was obtained for computer-assisted navigation  and this was used throughout the surgical procedure.   PROCEDURE:  The patient brought to the operating room at [facility] OR on Sep 18, 2006, placed in supine position on the  operating table.  General endotracheal anesthesia was established  without difficulty.  When the patient was  adequately anesthetized, his  nose was injected with a total of 8 mL of 1% lidocaine 1:100,000  solution epinephrine which was injected in submucosal fashion along the  nasal septum, inferior turbinates and left lateral nasal wall and middle  turbinate.  The patient's nose was then packed with Afrin soaked  cottonoid pledgets which were left in place for approximately 10 minutes  to allow for vasoconstriction and hemostasis.  The patient was  positioned on the operating table and prepped and draped in a sterile  fashion.  The Stealth Navigation System headgear was applied and  anatomic surgical landmarks were identified and confirmed.   The procedure was begun with a revision nasal septoplasty.  A right  anterior hemitransfixion incision was created, a mucoperichondrial flap  was elevated from anterior to posterior on the patient's right-hand  side.  There was a significant amount of scarring from his previous  surgery.  The patient had a bony septal spur in the mid and superior  aspect of the nasal septum which was gently mobilized.  On the left-hand  side the mucoperiosteal flap was elevated and the deviated bone and  cartilage were exposed.  These were resected bringing the remainder of  the septum to the midline and preserving the overlying mucosa.  At the  conclusion of the procedure, the mucoperichondrial flaps were  reapproximated with a 4-0 gut suture on a Keith needle in horizontal  mattress fashion and bilateral Doyle nasal septal splints were placed  after the application of Bactroban ointment and sutured into position  with a 3-0 Ethilon suture.   Left endoscopic sinus surgery was then undertaken.  Using a 0 degree  telescope, the middle turbinate was gently medialized.  The uncinate  process reflected anteriorly and resected with a through cutting  backbiting forceps.  The entire uncinate was removed and the ethmoid bulla was explored.  The anterior ethmoid air cells were  resected using  a straight microdebrider using the Stealth navigation tool, the lateral  nasal wall was examined, residual uncinate was resected and the natural  ostium of maxillary sinus was enlarged in an anterior and inferior  direction creating a widely patent maxillary ostium.  There was polypoid  mucosa in this region which was resected using a curved microdebrider  and removal of the diseased maxillary sinus tissue, the remainder of the  sinus appeared patent and normal.  Attention was then turned to the mid  ethmoid area and the area of opacification was identified using the  Stealth navigation tool.  This was abutting the left medial orbit and  obstructing the nasal frontal recess.  Using straight microdebrider,  this was resected.  The sinus appeared to be filled with polypoid mucosa  and thick mucus, no evidence of active infection.  Dissection was then  carried out through the floor  of the ethmoid sinus resecting bony  septations and diseased mucosa and creating a widely patent ethmoid  cavity.  The roof of the ethmoid was then dissected from posterior to  anterior using a 45 degree telescope.  Bony septations were resected and  the nasal frontal recess was entered.  This was widely patent at the  conclusion of the procedure and there was no evidence of obstruction,  mass or polyp.  Several areas of bleeding were identified and these were  cauterized with monopolar suction cautery set at 15 watts.  The  patient's nose was then irrigated and suctioned.  There was no active  bleeding and surgical debris was resected.  At the conclusion of the  procedure, a Kennedy sinus pack was placed in the left common ethmoid  cavity and hydrated with sterile saline.   Attention was then turned inferior turbinates were reduction was  undertaken with bipolar cautery set at 12 watts.  Two submucosal passes  were made in each inferior turbinate inferior.  The turbinates had been  adequately  cauterized, they were  outfractured and anterior incision was  created in each turbinate and turbinate bone was resected preserving the  overlying mucosa and creating an adequate reduction.   The patient's nasal cavity, nasopharynx, oral cavity and oropharynx were  irrigated and suctioned.  Orogastric tube was passed.  The stomach  contents were aspirated.  The patient was then awakened from his  anesthetic, he was extubated and transferred from the operating room to  the recovery room in stable condition.  No complications.  Blood loss  approximately 150 mL.           ______________________________  Kevin [name], M.D.     DLS/MEDQ  D:  29/56/2130  T:  09/18/2006  Job:  [id number]

## 2010-09-17 NOTE — Discharge Summary (Signed)
Kevin [name], ROYCE              ACCOUNT NO.:  [id number]   MEDICAL RECORD NO.:  [id number]          PATIENT TYPE:  OIB   LOCATION:  6523                         FACILITY:  [facility]   PHYSICIAN:  Vonna Kotyk R. Jacinto Halim, MD       DATE OF BIRTH:  05-Nov-1945   DATE OF ADMISSION:  06/11/2007  DATE OF DISCHARGE:  06/12/2007                               DISCHARGE SUMMARY   DISCHARGE DIAGNOSES:  1. Coronary artery disease, with previous intervention status post      catheterization yesterday with angioplasty to the mid to distal      circumflex.  2. Diabetes mellitus uncontrolled.  3. Obstructive sleep apnea on CPAP machine.  4. Hyperlipidemia.  5. Morbid obesity.   This is a 65 year old gentleman with previous history of coronary artery  disease and post Taxus stent to the mid circumflex and also stenting of  the LAD who presented to our office with complaints of chest pain.  Dani Gobble, M.D. felt that the patient needs to undergo coronary  angiography especially given his abnormal stress test.  So the patient  was admitted to [facility] on June 11, 2007, and had  catheterization done by Cristy Hilts. Jacinto Halim, M.D.  Catheterization revealed a  lesion in the mid to distal circumflex.  The full dictation of this  procedure is unavailable to me at the present time of dictation.  From  the diagram, I can see that Dr. Jacinto Halim performed angioplasty to the  stented segment with reduction of the lesion from 95% to 0%.   The next morning, he assessed the patient who did not have any signs of  complications post catheterization.  His hemoglobin was 15.7, hematocrit  40.0. His BUN 18, creatinine 1.1.   The patient is being discharged home on the following medications.   DISCHARGE MEDICATIONS:  1. Aspirin 325 mg daily.  2. Plavix 75 mg daily.  3. Prilosec 20 mg b.i.d.  4. Actos 45 mg daily.  5. Toprol XL 100 mg daily.  6. Niaspan 1000 mg daily.  7. Lipitor 10 mg daily.  8. Metformin 1000 mg  daily except on Monday.   The patient is not allowed to drive for three days post catheterization  as well as not to engage in heavy physical activity or heavy lifting and  he will be seen in follow-up by Dr. Domingo Sep in our office in two weeks  and the office will contact the patient to schedule that appointment.      Raymon Mutton, P.A.      Cristy Hilts. Jacinto Halim, MD  Electronically Signed    MK/MEDQ  D:  06/12/2007  T:  06/13/2007  Job:  [id number]   cc:   Dani Gobble, MD

## 2010-09-20 NOTE — Group Therapy Note (Signed)
   NAMEULISES, WOLFINGER                             ACCOUNT NO.:  [id number]   MEDICAL RECORD NO.:  [id number]                    PATIENT TYPE:   LOCATION:                                       FACILITY:   PHYSICIAN:  Dirk Dress. Katrinka Blazing, M.D.                DATE OF BIRTH:   DATE OF PROCEDURE:  09/04/2002  DATE OF DISCHARGE:                                   PROGRESS NOTE   ER NOTE:  This is a 65 year old male who had an IM injection of steroid-type  agent about 2 weeks ago by Dr. Renard Matter. He states that he has had increased  pain throughout the past 2 weeks. His left deltoid has swollen and has  become reddened.  He has been seen in the emergency room multiple times.  Because of continued complaints of pain, an MRI of the left upper arm was  done and this showed a large abscess cavity of the deltoid muscle extending  down to the fascia but not involving the bone.  The patient was seen at the  request of the emergency room physician.   DESCRIPTION OF PROCEDURE:  The area was prepped and draped with Betadine.  Local infiltration with 0.5% Marcaine with 1% Xylocaine was used for local  infiltration.  A size 15 scalpel was used to make an incision into the deep  subcutaneous tissue.  A small amount of pus was removed, but we had to  dissect about 5 cm down in order to get into the abscess cavity.  Once the  abscess cavity was entered a large volume of pus which probably measured  greater than 200 cc came out of the cavity.  The cavity was drained and then  flushed with sterile saline.  Once the purulence became clear the cavity was  flushed with Betadine.   After that a 1/4-inch Penrose drain was placed in the depth of the cavity.  It was sutured with 3-0 Prolene.  The patient tolerated this fairly well.  A  compression dressing was placed and he was advised to return to the office  tomorrow after 10 o'clock.  He is going to be placed on Levaquin 500 mg once  a day.  He refused to take pain  medication.   IMPRESSION:  Major abscess left deltoid secondary to IM injection.   RECOMMENDATIONS:  As above.                                               Dirk Dress. Katrinka Blazing, M.D.    LCS/MEDQ  D:  09/04/2002  T:  09/05/2002  Job:  [id number]   cc:   Angus G. Renard Matter, M.D.  [address]  [place]  [place] [address]  Fax: [phone]

## 2010-09-20 NOTE — H&P (Signed)
Kevin [name], Kevin [name]                           ACCOUNT NO.:  [id number]   MEDICAL RECORD NO.:  [id number]                   PATIENT TYPE:  INP   LOCATION:  3737                                 FACILITY:  [facility]   PHYSICIAN:  Kevin [name], M.D.                DATE OF BIRTH:  08-Sep-1945   DATE OF ADMISSION:  04/20/2003  DATE OF DISCHARGE:                                HISTORY & PHYSICAL   CHIEF COMPLAINT:  Recurrent chest discomfort, arm pain, and dyspnea with  exertion.   HISTORY OF PRESENT ILLNESS:  Kevin [name] is a 65 year old single white male  who has known coronary artery disease.  He had a stent placed in November of  2001 with failed percutaneous coronary intervention of the ostial diagonal.   He has had very little substantive followup.  He has normal left ventricular  systolic function in the past.   He transferred from [facility] Emergency Room after presenting with  midsternal chest pain, sharp in nature, going to his back and left shoulder  on Tuesday.  This was partially or completely relieved with nitroglycerin.  He had intermittent symptoms yesterday and again reminiscent of his pre-  stent discomfort.   EKG is essentially normal except for nonspecific T wave ST changes in lead  III.  Enzymes thus far have been negative x 1.   ALLERGIES:  She has multiple allergies including IVP DYE, ALTACE, AVAPRO,  KEFLEX, PENICILLIN, SULFA, CORTISONE INJECTION which caused a rash,  TETRACYCLINE, CLEOCIN, MINOCIN, and CODEINE.   MEDICATIONS PRIOR TO ADMISSION:  1. Toprol, unknown dose, 1 daily.  2. Aspirin 325 mg a day.  3. HCTZ 25 mg a day.  4. Lipitor 20 mg a day.  5. Niaspan 500 two of these q.h.s.  6. Benadryl p.r.n.  7. Albuterol nebulizers p.r.n.  8. CPAP.   HISTORY OF PRESENT ILLNESS:  1. Sleep apnea.  2. History of appendectomy.  3. Penile implant in 1998.  4. Morbid obesity.  5. Left knee surgery as recently as October 2004 which was  uncomplicated.   SOCIAL HISTORY:  Lives alone in [place].  He quit smoking 13 years ago,  occasionally drinks alcohol.  He is a Printmaker.   FAMILY HISTORY:  His father died of MI at age 38.  A brother has had two  myocardial infarctions and is age 78 presently.   REVIEW OF SYSTEMS:  Otherwise unremarkable other than as in HPI.   PHYSICAL EXAMINATION:  VITAL SIGNS: Blood pressure 146/80, pulse 71 and  regular.  Temperature 97.4, respiratory rate 16, O2 saturation 97% on 3  liters.  GENERAL:  He is in no acute distress.  He is very gregarious, very vocal.  Alert and oriented x 3.  NEUROLOGIC:  Exam is intact.  NECK:  Good carotid upstrokes without bruits.  No obvious JVD.  The neck is  full, no thyromegaly.  CARDIAC:  Exam reveals an S4 with normal S1, S2.  LUNGS:  Clear.  ABDOMEN:  Protuberant, good bowel sounds.  EXTREMITIES:  Reveal 2/4 femoral pulses.  Soft right femoral bruit noted,  2/4 dorsalis pedis.  No edema.   LABORATORY DATA:  Chest x-ray from [facility] showed no acute  cardiopulmonary disease.   EKG shows normal sinus rhythm with no acute changes.   Hemoglobin 14.8, platelets 214.  Potassium 3.6, BUN 15, creatinine 1.1.  Sugar 194.  Cardiac enzymes negative.  LFTs negative.   IMPRESSION:  1. Recurrent angina.  He has known coronary artery disease status post     stenting of the left anterior descending  in November 2001 with failed     percutaneous coronary intervention of the ostial diagonal.  He has     residual 80% ostial diagonal, 70% circumflex lesion.  He has normal left     ventricular function.  2. Multiple allergies including a CORTISONE INJECTION.  We are pulling his     chart to see what he was premedicated for prior to his previous     intervention.  3. Hyperlipidemia.  4. Hypertension.  5. Question of new onset diabetes.  6. Family history of coronary artery disease.  7. Morbid obesity.  8. Sleep apnea.   PLAN:  1. Check serial enzymes.   2. Check hemoglobin A1C and fasting lipids.  3. Pull old chart and premedicate as previously with his percutaneous     coronary intervention.  4. Note right femoral bruit.  This should not preclude cannulation from the     right groin.  5. Check fasting blood sugar.  6. Continue heparin, aspirin, Toprol, and nitroglycerin paste.   Catheterization has been discussed with the patient, and he agrees to  proceed tomorrow.  Indications, risks, and potential benefits were reviewed.                                                Kevin [name], M.D.    TCW/MEDQ  D:  04/20/2003  T:  04/20/2003  Job:  [id number]   cc:   Kevin [name], M.D.  [address]  [place]  [place] [address]  Fax: [phone]   Kevin [name], M.D.   Kevin [name], M.D.  Fax: [phone]

## 2010-10-01 ENCOUNTER — Encounter: Payer: Self-pay | Admitting: Urgent Care

## 2010-10-01 ENCOUNTER — Ambulatory Visit (INDEPENDENT_AMBULATORY_CARE_PROVIDER_SITE_OTHER): Payer: BC Managed Care – PPO | Admitting: Urgent Care

## 2010-10-01 VITALS — BP 144/83 | HR 119 | Temp 97.5°F | Ht 69.0 in | Wt 311.0 lb

## 2010-10-01 DIAGNOSIS — R197 Diarrhea, unspecified: Secondary | ICD-10-CM

## 2010-10-01 DIAGNOSIS — A09 Infectious gastroenteritis and colitis, unspecified: Secondary | ICD-10-CM | POA: Insufficient documentation

## 2010-10-01 DIAGNOSIS — K922 Gastrointestinal hemorrhage, unspecified: Secondary | ICD-10-CM

## 2010-10-01 LAB — CBC WITH DIFFERENTIAL/PLATELET
Hemoglobin: 13.7 g/dL (ref 13.0–17.0)
Lymphs Abs: 2 10*3/uL (ref 0.7–4.0)
Monocytes Relative: 9 % (ref 3–12)
Neutro Abs: 3.3 10*3/uL (ref 1.7–7.7)
Neutrophils Relative %: 55 % (ref 43–77)
RBC: 4.24 MIL/uL (ref 4.22–5.81)

## 2010-10-01 LAB — URINALYSIS, ROUTINE W REFLEX MICROSCOPIC
Ketones, ur: NEGATIVE mg/dL
Leukocytes, UA: NEGATIVE
Nitrite: NEGATIVE
Specific Gravity, Urine: 1.02 (ref 1.005–1.030)
pH: 5.5 (ref 5.0–8.0)

## 2010-10-01 LAB — URINALYSIS, MICROSCOPIC ONLY

## 2010-10-01 LAB — COMPREHENSIVE METABOLIC PANEL
Albumin: 4.5 g/dL (ref 3.5–5.2)
Alkaline Phosphatase: 55 U/L (ref 39–117)
CO2: 32 mEq/L (ref 19–32)
Glucose, Bld: 203 mg/dL — ABNORMAL HIGH (ref 70–99)
Potassium: 4.1 mEq/L (ref 3.5–5.3)
Sodium: 141 mEq/L (ref 135–145)
Total Protein: 7.4 g/dL (ref 6.0–8.3)

## 2010-10-01 NOTE — Assessment & Plan Note (Signed)
Heme positive stool & possible melena with nausea.  Will need colonoscopy & EGD to r/o PUD.

## 2010-10-01 NOTE — Progress Notes (Signed)
Referring Provider: Cassell Smiles., MD Primary Care Physician:  Cassell Smiles., MD Primary Gastroenterologist:  Dr. Jena Gauss  Chief Complaint  Patient presents with  . Rectal Bleeding    HPI:  Kevin [name] is a 65 y.o. male here as a referral from Dr. Sherwood Gambler for positive FOBT.  Pt notes his problems all started 2 months ago when he had tachycardia & was dehydrated w/ nausea and was transferred to [facility] to check his lap band.  He describes swallowing studies by Dr Lily Peer who then removed fluid from lap band & nausea seemed to get a little bit better.  Diarrhea started 2 weeks after his hospitalization, and lasted for 5-6 weeks.  No diarrhea since stopped plavix 5 days ago.  Stools BID now, was 6-7 stools per day.  Noticed darker stools like melena.  Went to [place] & blood was found in his stool.  Denies heartburn & indigestion.  Denies dysphagia or odynophagia.  C/o anorexia secondary to diarrhea.  C/o mid-abd pain 1 week ago that lasted 2 hrs.  Still w/ nausea some mornings when he wakes up.  Drank 2 bottles of water & 2 cups of coffee today.  Has been working in hot [place] this AM.  Mildly tachycardic.  Past Medical History  Diagnosis Date  . Melanoma 10/11    left foot  . Cellulitis 5/11    left leg  . CAD (coronary artery disease)   . HTN (hypertension)   . Diabetes mellitus   . Obesity   . Seasonal allergies   . S/P colonoscopy 07/03/03     Dr [name]->hyperplastic rectal polyp  . Diverticulosis     Past Surgical History  Procedure Date  . Appendectomy   . Shoulder surgery     left  . Knee surgery     left  . Penile prosthesis implant   . Coronary angioplasty 2002/2005/2008    Current Outpatient Prescriptions  Medication Sig Dispense Refill  . aspirin 325 MG tablet Take 325 mg by mouth daily.        [name] atorvastatin (LIPITOR) 20 MG tablet Take 20 mg by mouth daily.        . furosemide (LASIX) 40 MG tablet Take 40 mg by mouth 2 (two) times daily.        . metFORMIN  (GLUCOPHAGE) 1000 MG tablet Take 1,000 mg by mouth 2 (two) times daily with a meal.        . metoprolol (TOPROL-XL) 100 MG 24 hr tablet Take 100 mg by mouth daily.        . niacin (NIASPAN) 500 MG CR tablet Take 500 mg by mouth at bedtime.        . sitaGLIPtan (JANUVIA) 100 MG tablet Take 100 mg by mouth daily.        . valsartan-hydrochlorothiazide (DIOVAN-HCT) 160-12.5 MG per tablet Take 1 tablet by mouth daily.        . clopidogrel (PLAVIX) 75 MG tablet Take 75 mg by mouth daily.         Allergies as of 10/01/2010 - Review Complete 10/01/2010  Allergen Reaction Noted  . Altace  10/01/2010  . Avapro (irbesartan)  10/01/2010  . Cleocin  10/01/2010  . Codeine  10/01/2010  . Cortizone-5 (hydrocortisone base)  10/01/2010  . Indocin  10/01/2010  . Keflex  10/01/2010  . Minocin  10/01/2010  . Morphine and related  10/01/2010  . Penicillins  10/01/2010  . Prednisone  10/01/2010  . Septra (bactrim)  10/01/2010  .  Sulfa drugs cross reactors  10/01/2010  . Tetracyclines & related  10/01/2010   Family History  Problem Relation Age of Onset  . Liver cancer Brother 46  . Celiac disease Other 68    brother's daughter  . Coronary artery disease Mother   . Diabetes Father     History   Social History  . Marital Status: Divorced    Spouse Name: N/A    Number of Children: 1  . Years of Education: N/A   Occupational History  . surveyor     [place]   Social History Main Topics  . Smoking status: Former Smoker -- 3.0 packs/day for 60 years    Types: Cigarettes    Quit date: 10/04/1990  . Smokeless tobacco: Not on file  . Alcohol Use: Yes     social drinking a couple times per day  . Drug Use: No  . Sexually Active: Not Currently   Review of Systems: Gen: Denies any fever, chills, sweats.  C/o anorexia, fatigue & weakness. CV: Denies chest pain, angina, palpitations, syncope, orthopnea, PND, peripheral edema, and claudication. Resp: Denies dyspnea at rest, dyspnea with  exercise, cough, sputum, wheezing, coughing up blood, and pleurisy. GI: Denies vomiting blood, jaundice, and fecal incontinence.   Denies dysphagia or odynophagia. GU : Denies urinary burning, blood in urine, urinary frequency, urinary hesitancy, nocturnal urination, and urinary incontinence. MS: Denies joint pain, limitation of movement, and swelling, stiffness, low back pain, extremity pain. Denies muscle weakness, cramps, atrophy.  Derm: Denies rash, itching, dry skin, hives, moles, warts, or unhealing ulcers.  Psych: Denies depression, anxiety, memory loss, suicidal ideation, hallucinations, paranoia, and confusion. Heme: Denies bruising, bleeding, and enlarged lymph nodes.  Physical Exam: BP 144/83  Pulse 119  Temp(Src) 97.5 F (36.4 C) (Temporal)  Ht 5\' 9"  (1.753 m)  Wt 311 lb (141.069 kg)  BMI 45.93 kg/m2 General:   Alert,  Well-developed, obese, pleasant and cooperative in NAD Head:  Normocephalic and atraumatic. Eyes:  Sclera clear, no icterus.   Conjunctiva pink. Ears:  Normal auditory acuity. Nose:  No deformity, discharge,  or lesions. Mouth:  No deformity or lesions, dentition normal. Neck:  Supple; no masses or thyromegaly. Lungs:  Clear throughout to auscultation.   No wheezes, crackles, or rhonchi. No acute distress. Heart:  Apical pulse 112, mild tachycardia.   no murmurs, clicks, rubs,  or gallops. Abdomen:  Soft, nontender and nondistended. No masses, hepatosplenomegaly or hernias noted. Normal bowel sounds, without guarding, and without rebound.   Rectal:  Deferred until time of colonoscopy.   Msk:  Symmetrical without gross deformities. Normal posture. Pulses:  Normal pulses noted.  Extremities:  Without clubbing.  Trace bilat ankle edema. Neurologic:  Alert and  oriented x4;  grossly normal neurologically. Skin:  Intact without significant lesions or rashes. Cervical Nodes:  No significant cervical adenopathy. Psych:  Alert and cooperative. Normal mood and  affect.

## 2010-10-01 NOTE — Patient Instructions (Addendum)
Go straight to lab, I will call you w/ results Go to ER if you feel heart racing, dizziness, chest pain, trouble breathing, severe bleeding or pain Drink plenty low carb gatorade & water NOW Take half of your metformin & januvia the day before your procedures, hold day of procedures & bring to hospital

## 2010-10-01 NOTE — Assessment & Plan Note (Addendum)
BRACK SHADDOCK is a 65 y.o. caucasian male w/ 6 week history of diarrhea, nausea & hemoccult positive stool.  Will need further evaluation to r/o acute colitis, c diff, colorectal ca, inflammatory bowel disease, or less likely upper GI source.  Colonoscopy & EGD w/ Dr Jena Gauss as soon as possible.  I have discussed risks & benefits which include, but are not limited to, bleeding, infection, perforation & drug reaction.  The patient agrees with this plan & written consent will be obtained.    Encouraged pt to go directly to ER for fluids given tachycardia and he declined.  STAT labs & stools ordered. Go straight to lab, I will call you w/ results Go to ER if you feel heart racing, dizziness, chest pain, trouble breathing, severe bleeding or pain Drink plenty low carb gatorade & water NOW & all night Take half of your metformin & januvia the day before your procedures, hold day of procedures & bring to hospital

## 2010-10-02 NOTE — Progress Notes (Signed)
Cc to PCP 

## 2010-10-02 NOTE — Progress Notes (Signed)
Labs from 09/24/10->hct 39.4, hgb 13.2, plts 218, wbc 5.6, rbc 4.10.

## 2010-10-03 ENCOUNTER — Telehealth: Payer: Self-pay | Admitting: Urgent Care

## 2010-10-03 NOTE — Progress Notes (Signed)
Faxed labs an ov to [facility]

## 2010-10-03 NOTE — Progress Notes (Signed)
Faxed labs and ov to [facility]

## 2010-10-03 NOTE — Telephone Encounter (Signed)
Left message for return call Need PR from [facility]? How's he feeling?

## 2010-10-03 NOTE — Telephone Encounter (Signed)
Pt called back- he stated he is feeling better. [place] told him to continue with the gatorade and water. He is back on his plavix. Informed pt should he start having any problems or dizziness he should go to ER. Pt agreed.

## 2010-10-03 NOTE — Telephone Encounter (Signed)
Pt returned call- left message, tried to call pt back and he left his work number and was out in the field and they will have him call us back.

## 2010-10-05 LAB — FECAL LACTOFERRIN, QUANT: Lactoferrin: POSITIVE

## 2010-10-07 LAB — GIARDIA ANTIGEN: Giardia Screen (EIA): NEGATIVE

## 2010-10-08 ENCOUNTER — Observation Stay (HOSPITAL_COMMUNITY)
Admission: EM | Admit: 2010-10-08 | Discharge: 2010-10-10 | Disposition: A | Discharge status: Home / Self Care | Payer: BC Managed Care – PPO | Source: Ambulatory Visit | Attending: Internal Medicine | Admitting: Internal Medicine

## 2010-10-08 ENCOUNTER — Encounter: Payer: Self-pay | Admitting: Internal Medicine

## 2010-10-08 ENCOUNTER — Telehealth: Payer: Self-pay | Admitting: Urgent Care

## 2010-10-08 ENCOUNTER — Emergency Department (HOSPITAL_COMMUNITY): Payer: BC Managed Care – PPO

## 2010-10-08 ENCOUNTER — Encounter (HOSPITAL_COMMUNITY): Payer: Self-pay | Admitting: Radiology

## 2010-10-08 DIAGNOSIS — R197 Diarrhea, unspecified: Secondary | ICD-10-CM | POA: Insufficient documentation

## 2010-10-08 DIAGNOSIS — E871 Hypo-osmolality and hyponatremia: Principal | ICD-10-CM | POA: Insufficient documentation

## 2010-10-08 DIAGNOSIS — D126 Benign neoplasm of colon, unspecified: Secondary | ICD-10-CM | POA: Insufficient documentation

## 2010-10-08 DIAGNOSIS — E119 Type 2 diabetes mellitus without complications: Secondary | ICD-10-CM | POA: Insufficient documentation

## 2010-10-08 DIAGNOSIS — I10 Essential (primary) hypertension: Secondary | ICD-10-CM | POA: Insufficient documentation

## 2010-10-08 DIAGNOSIS — E876 Hypokalemia: Secondary | ICD-10-CM | POA: Insufficient documentation

## 2010-10-08 DIAGNOSIS — K269 Duodenal ulcer, unspecified as acute or chronic, without hemorrhage or perforation: Secondary | ICD-10-CM | POA: Insufficient documentation

## 2010-10-08 LAB — CBC
MCHC: 34.9 g/dL (ref 30.0–36.0)
MCV: 90.5 fL (ref 78.0–100.0)
Platelets: 190 10*3/uL (ref 150–400)
RDW: 13.3 % (ref 11.5–15.5)
WBC: 5.9 10*3/uL (ref 4.0–10.5)

## 2010-10-08 LAB — COMPREHENSIVE METABOLIC PANEL
ALT: 26 U/L (ref 0–53)
Albumin: 4.3 g/dL (ref 3.5–5.2)
Alkaline Phosphatase: 48 U/L (ref 39–117)
Chloride: 90 mEq/L — ABNORMAL LOW (ref 96–112)
Glucose, Bld: 159 mg/dL — ABNORMAL HIGH (ref 70–99)
Potassium: 2.9 mEq/L — ABNORMAL LOW (ref 3.5–5.1)
Sodium: 128 mEq/L — ABNORMAL LOW (ref 135–145)
Total Bilirubin: 1 mg/dL (ref 0.3–1.2)
Total Protein: 7 g/dL (ref 6.0–8.3)

## 2010-10-08 LAB — DIFFERENTIAL
Basophils Absolute: 0 10*3/uL (ref 0.0–0.1)
Eosinophils Absolute: 0.1 10*3/uL (ref 0.0–0.7)
Eosinophils Relative: 1 % (ref 0–5)

## 2010-10-08 LAB — SAMPLE TO BLOOD BANK

## 2010-10-08 MED ORDER — ONDANSETRON HCL 4 MG PO TABS: 4.0000 mg | ORAL_TABLET | Freq: Three times a day (TID) | ORAL | Status: AC | PRN

## 2010-10-08 NOTE — Telephone Encounter (Signed)
LMOM for return call.  We can call in something for him for nausea if he needs it. Also, he needs to check his blood sugars & I agree w/ Julie's recommendations. If he returns call, I would like him to be done tomorrow if possible.

## 2010-10-08 NOTE — Telephone Encounter (Signed)
Message copied by Joselyn Arrow on Tue Oct 08, 2010  3:38 PM ------      Message from: Ervin Knack A      Created: Tue Oct 08, 2010  3:12 PM       Call something in to [facility] for nausea

## 2010-10-08 NOTE — Telephone Encounter (Signed)
Why was pt's procedures moved?  He needs ASAP. Thanks

## 2010-10-08 NOTE — Telephone Encounter (Signed)
LMOM.  We can call in

## 2010-10-08 NOTE — Telephone Encounter (Signed)
Pt called yesterday with several questions about prep, answered all questions he had. He called back this am stated he was having some nausea, he stated he took all his meds on an empty stomach and with just water and has only had water this morning and nothing else. Advised pt to eat some jello, or a popsicle and get some juice or gatorade or gingerale in him. He stated he would. Pt called back a few minutes ago and spoke with Crystal and stated he was too sick to do it and changed appt to next week.

## 2010-10-08 NOTE — Telephone Encounter (Signed)
Spoke w/ pt.  C/o nausea all morning.  Took BS 100 today.  Going to see Dr Sherwood Gambler now on the way out the door.  Pt advised to have Dr Sherwood Gambler call me if we need to discuss his procedures for tomorrow.  Please leave his slot open for tomorrow.

## 2010-10-09 ENCOUNTER — Encounter: Payer: BC Managed Care – PPO | Admitting: Internal Medicine

## 2010-10-09 ENCOUNTER — Ambulatory Visit (HOSPITAL_COMMUNITY)
Admission: RE | Admit: 2010-10-09 | Payer: BC Managed Care – PPO | Source: Ambulatory Visit | Admitting: Internal Medicine

## 2010-10-09 ENCOUNTER — Other Ambulatory Visit: Payer: Self-pay | Admitting: Internal Medicine

## 2010-10-09 DIAGNOSIS — D126 Benign neoplasm of colon, unspecified: Secondary | ICD-10-CM

## 2010-10-09 DIAGNOSIS — K269 Duodenal ulcer, unspecified as acute or chronic, without hemorrhage or perforation: Secondary | ICD-10-CM

## 2010-10-09 DIAGNOSIS — K5730 Diverticulosis of large intestine without perforation or abscess without bleeding: Secondary | ICD-10-CM

## 2010-10-09 DIAGNOSIS — R195 Other fecal abnormalities: Secondary | ICD-10-CM

## 2010-10-09 DIAGNOSIS — R197 Diarrhea, unspecified: Secondary | ICD-10-CM

## 2010-10-09 DIAGNOSIS — Z9889 Other specified postprocedural states: Secondary | ICD-10-CM

## 2010-10-09 DIAGNOSIS — K52839 Microscopic colitis, unspecified: Secondary | ICD-10-CM

## 2010-10-09 HISTORY — PX: COLONOSCOPY: SHX174

## 2010-10-09 HISTORY — PX: ESOPHAGOGASTRODUODENOSCOPY: SHX1529

## 2010-10-09 HISTORY — DX: Other specified postprocedural states: Z98.890

## 2010-10-09 HISTORY — DX: Microscopic colitis, unspecified: K52.839

## 2010-10-09 LAB — LIPID PANEL
Cholesterol: 110 mg/dL (ref 0–200)
Total CHOL/HDL Ratio: 3.7 RATIO
VLDL: 51 mg/dL — ABNORMAL HIGH (ref 0–40)

## 2010-10-09 LAB — BASIC METABOLIC PANEL
BUN: 13 mg/dL (ref 6–23)
GFR calc Af Amer: >60 mL/min (ref >60)
GFR calc non Af Amer: >60 mL/min (ref >60)
Potassium: 3.7 mEq/L (ref 3.5–5.1)
Sodium: 131 mEq/L — ABNORMAL LOW (ref 135–145)

## 2010-10-09 LAB — CBC
HCT: 38.4 % — ABNORMAL LOW (ref 39.0–52.0)
Hemoglobin: 13.4 g/dL (ref 13.0–17.0)
MCV: 92.1 fL (ref 78.0–100.0)
RBC: 4.17 MIL/uL — ABNORMAL LOW (ref 4.22–5.81)
WBC: 4.6 10*3/uL (ref 4.0–10.5)

## 2010-10-09 LAB — D-DIMER, QUANTITATIVE: D-Dimer, Quant: 0.22 ug/mL-FEU (ref 0.00–0.48)

## 2010-10-09 LAB — GLUCOSE, CAPILLARY
Glucose-Capillary: 156 mg/dL — ABNORMAL HIGH (ref 70–99)
Glucose-Capillary: 163 mg/dL — ABNORMAL HIGH (ref 70–99)

## 2010-10-09 LAB — MAGNESIUM: Magnesium: 1.7 mg/dL (ref 1.5–2.5)

## 2010-10-09 LAB — DIFFERENTIAL
Lymphocytes Relative: 33 % (ref 12–46)
Lymphs Abs: 1.5 10*3/uL (ref 0.7–4.0)
Monocytes Relative: 8 % (ref 3–12)
Neutrophils Relative %: 56 % (ref 43–77)

## 2010-10-09 LAB — CARDIAC PANEL(CRET KIN+CKTOT+MB+TROPI)
CK, MB: 3.7 ng/mL (ref 0.3–4.0)
Relative Index: 1.4 (ref 0.0–2.5)

## 2010-10-09 NOTE — H&P (Signed)
Kevin [name], Kevin [name]              ACCOUNT NO.:  [id number]  MEDICAL RECORD NO.:  [id number]  LOCATION:  A336                          FACILITY:  [facility]  PHYSICIAN:  Vania Rea, M.D. DATE OF BIRTH:  13-Nov-1945  DATE OF ADMISSION:  10/08/2010 DATE OF DISCHARGE:  LH                             HISTORY & PHYSICAL   PRIMARY CARE PHYSICIAN:  Madelin Rear. Sherwood Gambler, MD  GASTROENTEROLOGIST:  Jonathon Bellows, MD, Caleen Essex  CARDIOLOGIST:  Nanetta Batty, MD  CHIEF COMPLAINT:  Blood in stool, and not feeling well.  HISTORY OF PRESENT ILLNESS:  This is a 65 year old morbidly obese Caucasian gentleman with a history of persistent diarrhea for about 6 weeks, episodic blood in stool, who has been evaluated as an outpatient and is scheduled to have endoscopy tomorrow. The patient does report a colonoscopy 8 years ago which revealed benign polyps and diverticular disease and also reports a history of coronary artery disease status post bare-metal stent in 2001 and drug-eluting stent in 2005. He is also status post lap banding many years ago, causing some weight loss but had the fluid from the lap band removed at [facility] after the onset of his recent GI problems.  The patient reports that he was at home tonight and had sudden onset of not feeling well, shortness of breath, nausea, and palpitations, and came to the emergency room to be evaluated.  In the emergency room, no abnormalities were found.  The patient's heart was found to be normal and his descriptions of his feelings are extremely vague, unable to pin him down very clearly on anything.  He seems emotionally disconnected from his symptoms yet,  he insists that he is too sick to go back home and the Hospitalist Service was called to assist with management.   He denies any fever, cough, or cold.  He denies any frank chest pains.  He denies any dizziness or syncope.  PAST MEDICAL HISTORY:  Diabetes, hypertension, hyperlipidemia,  morbid obesity, coronary artery disease status post stenting as noted above, status post remote lap banding with removal of the fluid from the lap band, osteoarthritis status post knee surgery, history of obstructive sleep apnea, chronic venous stasis, status post excision of a malignant melanoma of left foot 1 year ago.  MEDICATIONS: 1. Lipitor 40 mg daily. 2. Niaspan extended release 1000 mg twice daily. 3. Diovan unknown dose. 4. Toprol-XL 25 mg daily. 5. Plavix 1 tablet daily. 6. Aspirin 81 mg daily. 7. Lasix 40 mg 3 times daily. 8. Potassium unknown dose. 9. Metformin 1000 mg twice daily.  ALLERGIES:  CLEOCIN, MINOCIN, PENICILLIN, SULFA, CODEINE, MORPHINE, ALTACE, AVAPRO, PREDNISONE, CORTISONE, INDOCIN, and SHELLFISH, as well as IV CONTRAST MEDIA, PEANUTS, BANANAS.  SOCIAL HISTORY:  Denies tobacco, alcohol, or illicit drug use.  He is still working at [employer].  FAMILY HISTORY:  Significant for COPD, coronary artery disease, osteoarthritis, diabetes.  REVIEW OF SYSTEMS:  Other than noted above, unremarkable.  PHYSICAL EXAMINATION:  GENERAL:  Morbidly obese Caucasian gentleman, reclining in the stretcher, in no acute distress. VITAL SIGNS:  Temperature is 97.7, his pulse is 70, respirations 22, blood pressure 124/72.  He is saturating at 98% on room air.  HEENT:  Pupils are round and equal.  Mucous membranes pink.  Anicteric. NECK:  He has a very thick neck, but no thyromegaly, lymphadenopathy felt.  No carotid bruits. CHEST:  Clear to auscultation bilaterally. CARDIOVASCULAR SYSTEM:  Regular rhythm.  He has 2/6 systolic murmur. ABDOMEN:  Obese and soft.  There are no masses. EXTREMITIES:  Without edema.  He has venous stasis changes of both legs, but no ulceration.  Dorsalis pedis pulses are 1+ bilaterally. CENTRAL NERVOUS SYSTEM:  Cranial nerves II-XII were grossly intact.  He has no focal lateralizing signs.  LABS:  His white count is normal at 5.9, hemoglobin 13,  platelets 190. He has a normal differential.  Sodium is low at 128, potassium low at 2.9, chloride 90, CO2 26, glucose 159, BUN 16, creatinine 1.0.  His calcium is 9.8.  Liver functions are completely normal.  His PT, PTT, and INR are completely normal.  Acute abdominal series shows no evidence of active pulmonary disease.  Gas-filled small and large bowel likely representing ileus, poor visualization of the gastric lap band, but appears to be properly oriented.  ASSESSMENT: 1. Hypokalemia and dehydration. 2.Anxiety. 3. Hypertension. 4. Coronary artery disease. 5. Chronic diarrhea. 6. Diabetes.  PLAN:  The overall impression is this gentleman is seeking admissions for reasons which are not quite clear. His history is vague and were somehow unable to pin down exactly what is his concern.  We will bring him in and on observation and continue prep for his colonoscopy in the morning.  We will check an EKG and check a set of cardiac enzymes, however, he gives no clear cardiac symptoms to suggest acute cardiac problem and seems to be more concerned about GI bleeding even though he has not had any GI bleeding today.  Other plans as per orders.     Vania Rea, M.D.     LC/MEDQ  D:  10/09/2010  T:  10/09/2010  Job:  [id number]  cc:   Madelin Rear. Sherwood Gambler, MD Fax: [phone]  Nanetta Batty, M.D. Fax: [phone]  R. Roetta Sessions, MD FACP [facility] [address] [place] [address]  Electronically Signed by Vania Rea M.D. on 10/09/2010 06:52:21 AM

## 2010-10-09 NOTE — Telephone Encounter (Signed)
Document opened in error

## 2010-10-10 LAB — BASIC METABOLIC PANEL
BUN: 12 mg/dL (ref 6–23)
Calcium: 10.1 mg/dL (ref 8.4–10.5)
Creatinine, Ser: 1.06 mg/dL (ref 0.4–1.5)
GFR calc non Af Amer: >60 mL/min (ref >60)
Glucose, Bld: 141 mg/dL — ABNORMAL HIGH (ref 70–99)

## 2010-10-10 LAB — DIFFERENTIAL
Basophils Relative: 1 % (ref 0–1)
Eosinophils Absolute: 0.1 10*3/uL (ref 0.0–0.7)
Monocytes Absolute: 0.5 10*3/uL (ref 0.1–1.0)
Monocytes Relative: 9 % (ref 3–12)
Neutrophils Relative %: 66 % (ref 43–77)

## 2010-10-10 LAB — CBC
MCH: 31.9 pg (ref 26.0–34.0)
MCHC: 33.5 g/dL (ref 30.0–36.0)
Platelets: 159 10*3/uL (ref 150–400)
RBC: 3.82 MIL/uL — ABNORMAL LOW (ref 4.22–5.81)

## 2010-10-10 LAB — GLUCOSE, CAPILLARY: Glucose-Capillary: 149 mg/dL — ABNORMAL HIGH (ref 70–99)

## 2010-10-11 NOTE — Discharge Summary (Signed)
Kevin [name], Kevin [name]              ACCOUNT NO.:  [id number]  MEDICAL RECORD NO.:  [id number]  LOCATION:  A336                          FACILITY:  [facility]  PHYSICIAN:  Isidor Holts, M.D.  DATE OF BIRTH:  1945/10/11  DATE OF ADMISSION:  10/08/2010 DATE OF DISCHARGE:  06/07/2012LH                              DISCHARGE SUMMARY   PRIMARY CARE PHYSICIAN:  Madelin Rear. Sherwood Gambler, MD  PRIMARY GASTROENTEROLOGIST:  Jonathon Bellows, MD Caleen Essex  PRIMARY CARDIOLOGIST:  Nanetta Batty, MD  DISCHARGE DIAGNOSES: 1. Chronic diarrhea. 2. Cecal polyps, confirmed by colonoscopy October 09, 2010,     extracted/ablated. 3. Extensive duodenal erosions, confirmed by upper gastrointestinal     endoscopy on October 09, 2010. Biopsied. 4. Hypertension. 5. Type 2 diabetes mellitus. 6. Dyslipidemia. 7. History of coronary artery disease, status post stents. 8. History of morbid obesity, status post remote lap banding. 9. Obstructive sleep apnea syndrome. 10.Chronic venous stasis. 11.Osteoarthritis. 12.Mild dehydration/hypokalemia.  DISCHARGE MEDICATIONS: 1. Protonix 40 mg p.o. daily. 2. Acai Berry supplement OTC 1 capsule p.o. daily. 3. Cetirizine 10 mg OTC 1 tablet p.o. p.r.n. daily for allergies. 4. Cinnamon OTC 1 capsule p.o. daily. 5. Coenzyme Q10 OTC 1 capsule p.o. daily. 6. Diovan HCT (160/12.5) 1 p.o. daily. 7. Diphenhydramine 25 mg p.o. p.r.n. daily for allergies. 8. Flaxseed oral OTC 1 capsule p.o. daily. 9. Furosemide 40 mg p.o. b.i.d. 10.Lipitor 20 mg p.o. nightly. 11.Metformin 1000 mg p.o. b.i.d. 12.Metoprolol tartrate 100 mg p.o. daily. 13.Multivitamins therapeutic 1 tablet p.o. daily. 14.Niaspan 1000 mg 2 capsules p.o. nightly. 15.Plavix 75 mg p.o. daily. 16.Potassium chloride 20 mEq p.o. t.i.d. 17.Resveratrol 500 mg p.o. daily. 18.Vitamin C 1000 mg p.o. daily.   Note: Aspirin 81 mg p.o. daily, has been discontinued until reviewed by     Dr. Jena Gauss.  PROCEDURES: 1. Abdominal/chest  x-ray on October 08, 2010.  This showed no evidence of     active pulmonary disease.  There was gas-filled small and large     bowel likely representing ileus.  Poor visualization of gastric Lap-     Band, but it appears to be probably oriented. 2. Upper and lower GI endoscopy performed on October 09, 2010 by Dr.     Roetta Sessions, gastroenterologist.  This showed normal esophagus.     There was a lap band present, otherwise normal stomach.  Duodenal     erosions extensive D2 without frank ulcer, with inflammatory     changes tapering of down and third portion of the duodenum.     Biopsies of second portion of duodenum with him for histologic     study.  Colonoscopy demonstrated normal rectum, long redundant     colon, pancolonic diverticula, and cecal polyps were revealed 6 mm     at the basilar septum which was removed with hot snare as well as a     diminutive polyp at the appendiceal orifice which was ablated with     tip of a hot snare cauter units.  The procedure was uncomplicated.  CONSULTATIONS:  Dr. Roetta Sessions, gastroenterologist.  ADMISSION HISTORY:  See H and P notes of October 08, 2010, dictated by Dr. Vania Rea.  However,  in brief, this is a 65 year old male, with known history of morbid obesity, status post gastric lap banding, type 2 diabetes mellitus, hypertension, dyslipidemia, history of coronary artery disease, status post multiple stents, i.e. in 2001 and 2005, chronic venous stasis, obstructive sleep apnea syndrome, and osteoarthritis as well as a 6-week history of diarrhea, currently undergoing GI workup under the auspices of Dr. Roetta Sessions, presenting with feeling nonspecifically unwell, as well as passage of blood per rectum in the stool.  He had been scheduled for an upper and lower endoscopy for October 10, 2010.  He was therefore admitted for further evaluation, investigation, and management.  CLINICAL COURSE: 1. Dehydration/hypokalemia.  The patient presented  as described above,     BUN was found to be 16, creatinine 1.0, potassium 2.9, and sodium     128.  All this, against background of diuretic therapy and diarrhea.     The patient was managed with intravenous infusion of normal saline     and as of October 10, 2010, sodium had normalized at 139.  Potassium     supplements were also utilized and as of October 10, 2010 potassium was     4.0, BUN was 12, creatinine 1.06.  2. Chronic diarrhea.  There was no exacerbation during the course of     this hospitalization.  He will continue to follow up as prior, with     Dr. Roetta Sessions, gastroenterologist.  3. Duodenal erosions.  The patient underwent upper GI endoscopy on     October 09, 2010. For findings, refer to procedure listed above.     However, this showed extensive duodenal erosions in the second     portion.  The patient has been commenced on proton pump inhibitor     accordingly and biopsy also taken for histologic examination.  4. Colonic polyp.  The patient had 2 cecal polyps removed on     colonoscopy, which otherwise revealed pandiverticulosis and     redundancy.  5. Hypertension.  The patient remained normotensive throughout the     course of hospitalization, on preadmission antihypertensive     medications.  6. Type 2 diabetes mellitus.  This was readily controlled on     preadmission oral hypoglycemic medications, appropriate diet, and     sliding-scale insulin coverage.  7. History of coronary artery disease.  There were no problems     referable to this.  The patient is on Plavix, as well as aspirin.     Following my discussion with Dr. Jonette Eva on October 09, 2010, who     was covering for Dr. Roetta Sessions, she has recommended that we     discontinue aspirin, continue the Plavix until subsequently     reviewed by Dr. Roetta Sessions on an outpatient basis, at which point     a decision will be undertaken, as to when to reinstate aspirin. 8. Obstructive sleep apnea syndrome.   This did not prove problematic.  DISPOSITION:  The patient was on October 10, 2010, asymptomatic.  There were no new issues.  He was considered clinically stable for discharge and discharged accordingly.  ACTIVITY:  As tolerated.  DIET:  Heart-healthy/carbohydrate modified.  FOLLOWUP INSTRUCTIONS:  The patient will follow up with Dr. Roetta Sessions, per prior scheduled appointment.  He will also follow up with his primary cardiologist Dr. Nanetta Batty, per prior scheduled appointment and with his primary MD Dr. Elfredia Nevins, per prior scheduled appointment.  All this has been communicated to  the patient and he is verbalized understanding.     Isidor Holts, M.D.     CO/MEDQ  D:  10/10/2010  T:  10/11/2010  Job:  [id number]  cc:   Madelin Rear. Sherwood Gambler, MD Fax: [phone]  R. Roetta Sessions, MD FACP [facility] [address] [place] [address]  Nanetta Batty, M.D. Fax: [phone]  Electronically Signed by Isidor Holts M.D. on 10/11/2010 02:53:07 PM

## 2010-10-16 ENCOUNTER — Encounter: Payer: BC Managed Care – PPO | Admitting: Internal Medicine

## 2010-10-24 ENCOUNTER — Ambulatory Visit (INDEPENDENT_AMBULATORY_CARE_PROVIDER_SITE_OTHER): Payer: BC Managed Care – PPO | Admitting: Urgent Care

## 2010-10-24 ENCOUNTER — Encounter: Payer: Self-pay | Admitting: Urgent Care

## 2010-10-24 DIAGNOSIS — R197 Diarrhea, unspecified: Secondary | ICD-10-CM

## 2010-10-24 DIAGNOSIS — K922 Gastrointestinal hemorrhage, unspecified: Secondary | ICD-10-CM

## 2010-10-24 DIAGNOSIS — K5289 Other specified noninfective gastroenteritis and colitis: Secondary | ICD-10-CM

## 2010-10-24 DIAGNOSIS — K52839 Microscopic colitis, unspecified: Secondary | ICD-10-CM | POA: Insufficient documentation

## 2010-10-24 NOTE — Progress Notes (Signed)
Cc to Dr. Fusco 

## 2010-10-24 NOTE — Assessment & Plan Note (Signed)
Resolved

## 2010-10-24 NOTE — Progress Notes (Signed)
Referring Provider: Cassell Smiles., MD Primary Care Physician:  Cassell Smiles., MD Primary Gastroenterologist:  Dr. Jena Gauss  Chief Complaint  Patient presents with  . Follow-up    doing better    HPI:  Kevin [name] is a 65 y.o. male here for follow up for microscopic colitis just dx on colonoscopy.  On entocort 9mg  daily x 1 wk & doing well.  BM once daily.  Rare intermittent cramps.  Diabetes under better control w/ Januvia, but wants to discuss this med w/ Dr Sherwood Gambler.  Checking blood sugars around 200 @ home.  Is working w/ Dr Sherwood Gambler to get tighter control.    Past Medical History  Diagnosis Date  . Melanoma 10/11    left foot  . Cellulitis 5/11    left leg  . CAD (coronary artery disease)   . HTN (hypertension)   . Diabetes mellitus   . Obesity   . Seasonal allergies   . S/P colonoscopy 07/03/03     Dr [name]->hyperplastic rectal polyp  . Diverticulosis   . Sleep apnea   . Microscopic colitis 10/09/10    Colonoscopy dr Jena Gauss  . Diverticulosis   . S/P endoscopy 10/09/10    lap band intact, duodenal erosions    Past Surgical History  Procedure Date  . Appendectomy   . Shoulder surgery     left  . Knee surgery     left  . Penile prosthesis implant   . Coronary angioplasty 2002/2005/2008    Current Outpatient Prescriptions  Medication Sig Dispense Refill  . atorvastatin (LIPITOR) 20 MG tablet Take 20 mg by mouth daily.        . clopidogrel (PLAVIX) 75 MG tablet Take 75 mg by mouth daily.        . metFORMIN (GLUCOPHAGE) 1000 MG tablet Take 1,000 mg by mouth 2 (two) times daily with a meal.        . metoprolol (TOPROL-XL) 100 MG 24 hr tablet Take 100 mg by mouth daily.        . niacin (NIASPAN) 500 MG CR tablet Take 500 mg by mouth at bedtime.        . ondansetron (ZOFRAN) 4 MG tablet Take 1 tablet (4 mg total) by mouth every 8 (eight) hours as needed for nausea.  30 tablet  0  . valsartan-hydrochlorothiazide (DIOVAN-HCT) 160-12.5 MG per tablet Take 1 tablet by mouth  daily.        [name] DISCONTD: aspirin 325 MG tablet Take 325 mg by mouth daily.        [name] DISCONTD: furosemide (LASIX) 40 MG tablet Take 40 mg by mouth 2 (two) times daily.        [name] DISCONTD: sitaGLIPtan (JANUVIA) 100 MG tablet Take 100 mg by mouth daily.          Allergies as of 10/24/2010 - Review Complete 10/24/2010  Allergen Reaction Noted  . Altace  10/01/2010  . Avapro (irbesartan)  10/01/2010  . Cleocin  10/01/2010  . Codeine  10/01/2010  . Cortizone-5 (hydrocortisone base)  10/01/2010  . Indocin  10/01/2010  . Keflex  10/01/2010  . Minocin  10/01/2010  . Morphine and related  10/01/2010  . Penicillins  10/01/2010  . Prednisone  10/01/2010  . Septra (bactrim)  10/01/2010  . Sulfa drugs cross reactors  10/01/2010  . Tetracyclines & related  10/01/2010    Review of Systems: Gen: Denies any fever, chills, sweats, anorexia, fatigue, weakness, malaise, weight loss, and sleep disorder CV:  Denies chest pain, angina, palpitations, syncope, orthopnea, PND, peripheral edema, and claudication. Resp: Denies dyspnea at rest, dyspnea with exercise, cough, sputum, wheezing, coughing up blood, and pleurisy. GI: Denies vomiting blood, jaundice, and fecal incontinence.   Denies dysphagia or odynophagia. Derm: Denies rash, itching, dry skin, hives, moles, warts, or unhealing ulcers.  Psych: Denies depression, anxiety, memory loss, suicidal ideation, hallucinations, paranoia, and confusion. Heme: Denies bruising, bleeding, and enlarged lymph nodes.  Physical Exam: BP 136/77  Pulse 77  Temp(Src) 96.9 F (36.1 C) (Temporal)  Ht 5' 9.5" (1.765 m)  Wt 311 lb (141.069 kg)  BMI 45.27 kg/m2 General:   Alert,  Well-developed, well-nourished, pleasant and cooperative in NAD Head:  Normocephalic and atraumatic. Eyes:  Sclera clear, no icterus.   Conjunctiva pink. Mouth:  No deformity or lesions, dentition normal. Neck:  Supple; no masses or thyromegaly. Heart:  Regular rate and rhythm; no murmurs,  clicks, rubs,  or gallops. Abdomen:  Obese +easily reucible umbilical hernia, non-tender.  Palpable lap bad port in right upper abd.  Soft, nontender and nondistended. No masses, hepatosplenomegaly. Normal bowel sounds, without guarding, and without rebound.   Msk:  Symmetrical without gross deformities. Normal posture. Pulses:  Normal pulses noted. Extremities:  Without clubbing or edema. Neurologic:  Alert and  oriented x4;  grossly normal neurologically. Skin:  Intact without significant lesions or rashes. Cervical Nodes:  No significant cervical adenopathy. Psych:  Alert and cooperative. Normal mood and affect.

## 2010-10-24 NOTE — Assessment & Plan Note (Signed)
Resolved w/ treatment for microscopic colitis.

## 2010-10-24 NOTE — Patient Instructions (Addendum)
Continue entocort 9mg  daily Call in 1 month w/ a progress report & we may decrease to 6mg  daily if you are doing well Office visit 2 months w/ Dr Jena Gauss Follow up w. Dr Sherwood Gambler regarding your diabetes [facility] handout microscopic colitis to read

## 2010-10-24 NOTE — Assessment & Plan Note (Addendum)
In remission on entocort 9mg  daily.  Will continue.   Continue entocort 9mg  daily Call in 1 month w/ a progress report & we may decrease to 6mg  daily if you are doing well Office visit 2 months w/ Dr Jena Gauss Follow up w. Dr Sherwood Gambler regarding your diabetes [facility] handout microscopic colitis to read

## 2010-11-18 NOTE — Op Note (Addendum)
NAMEMAYA, SCHOLER              ACCOUNT NO.:  [id number]  MEDICAL RECORD NO.:  [id number]  LOCATION:  DAY                           FACILITY:  [facility]  PHYSICIAN:  R. Roetta Sessions, MD FACP FACGDATE OF BIRTH:  Jan 17, 1946  DATE OF PROCEDURE:  10/09/2010 DATE OF DISCHARGE:                              OPERATIVE REPORT   PROCEDURE:  Esophagogastroduodenoscopy with small bowel biopsy followed by colonoscopy with hot snare polypectomy, polyp ablation, biopsy.  INDICATIONS FOR PROCEDURE:  A 65 year old gentleman with protracted nonbloody diarrhea, but Hemoccult positive stools.  Set up for an EGD and colonoscopy today.  He came to the emergency room last night because of difficulties with the prep.  He was admitted.  He is really doing well today.  Hemoglobin remains in 13 range.  He has not had any melena or hematochezia.  No nausea or vomiting.  EGD and colonoscopy are now being done per plan.  Risks, benefits, limitations, alternatives, and imponderables have been discussed, questions answered.  Please see the documentation medical record.  PROCEDURE NOTE:  O2 saturation, blood pressure, pulse, and respirations were monitored throughout the entirety of the procedure.  CONSCIOUS SEDATION:  Versed 8 mg IV and Demerol 125 mg IV in divided doses.  Cetacaine spray for topical pharyngeal anesthesia.  INSTRUMENT:  Pentax video chip system.  FINDINGS:  EGD examination of the tubular esophagus revealed no mucosal abnormalities.  EG junction easily traversed.  Stomach:  Gastric cavity was emptied and insufflated well with air.  Thorough examination of the gastric mucosa including retroflexed proximal stomach esophagogastric junction demonstrated circumferential submucosal impression in the cardia consistent with presence of previously placed lap band, otherwise the gastric mucosa appeared normal.  Pylorus was patent, easily traversed.  Examination of the second portion demonstrated  extensive D2 erosions without frank ulcer being seen.  These inflammatory changes appeared to taper off down in the third portion of duodenum. Therapeutic/diagnostic maneuvers performed.  Biopsies of second portion and duodenum were taken for histologic study.  The patient tolerated the procedure well and was prepared for colonoscopy.  Digital rectal exam revealed no abnormalities.  Endoscopic findings, prep was suboptimal but doable.  Colon:  Colonic mucosa was surveyed from the rectosigmoid junction through the left transverse right colon to the appendiceal orifice, ileocecal valve/cecum.  These structures were well seen and photographed for the record.  From this level, scope was slowly and cautiously withdrawn.  All previously mentioned mucosal surfaces were again seen.  The patient had a long redundant colon requiring external abdominal pressure to reach the cecum.  The patient had scattered pancolonic diverticula.  The patient had a 6-mm polyp in the base of cecum which hot snare removed with diminutive polyp at the appendiceal orifice, which was ablated with the tip of hot snare cautery unit.  The patient had scattered pancolonic diverticula.  The remainder of the colonic mucosa appeared normal.  Biopsies of the ascending sigmoid segments were taken to rule out microscopic colitis.  Scope was pulled down in the rectum where thorough examination of the rectal mucosa including retroflex view anal verge demonstrated no abnormalities.  The patient tolerated this procedure well.  Cecal withdrawal time 14 minutes.  IMPRESSION: 1. Normal esophagogastroduodenoscopy.  Normal esophagus. 2. Lap band present, otherwise normal stomach, duodenal erosions as     described above status post biopsy.  Colonoscopy findings, normal     rectum, long redundant colon, pancolonic diverticula.  Cecal polyps     treated as described above.  Segmental biopsies were taken. 3. I doubt acute gastrointestinal  bleed, the patient is not acutely     ill from GI standpoint at this time.  RECOMMENDATIONS: 1. Advance her carbohydrate modified diet. 2. Followup on pending studies. 3. We will continuing GI workup was an outpatient. 4. He is okay to be discharged anytime from GI standpoint.     Jonathon Bellows, MD FACP [facility]     RMR/MEDQ  D:  10/09/2010  T:  10/09/2010  Job:  [id number]  cc:   Madelin Rear. Sherwood Gambler, MD Fax: [phone]  [facility]  Electronically Signed by Lorrin Goodell M.D. on 11/18/2010 08:31:44 AM

## 2011-01-24 LAB — CBC
HCT: 40
Hemoglobin: 13.7
RBC: 4.23
WBC: 10.6 — ABNORMAL HIGH

## 2011-01-24 LAB — BASIC METABOLIC PANEL
Calcium: 9.3
GFR calc Af Amer: >60
GFR calc non Af Amer: >60
Potassium: 3.5
Sodium: 136

## 2011-01-24 LAB — HEMOGLOBIN A1C: Mean Plasma Glucose: 243

## 2011-04-08 ENCOUNTER — Telehealth: Payer: Self-pay | Admitting: *Deleted

## 2011-04-08 NOTE — Telephone Encounter (Signed)
Pt moved 12-5 to 1-28 °

## 2011-04-09 ENCOUNTER — Ambulatory Visit: Payer: BC Managed Care – PPO | Admitting: Family

## 2011-06-02 ENCOUNTER — Encounter: Payer: Self-pay | Admitting: Hematology & Oncology

## 2011-06-02 ENCOUNTER — Ambulatory Visit (HOSPITAL_BASED_OUTPATIENT_CLINIC_OR_DEPARTMENT_OTHER): Payer: BC Managed Care – PPO | Admitting: Hematology & Oncology

## 2011-06-02 VITALS — BP 144/79 | HR 78 | Temp 98.2°F | Ht 69.5 in | Wt 312.8 lb

## 2011-06-02 DIAGNOSIS — E119 Type 2 diabetes mellitus without complications: Secondary | ICD-10-CM

## 2011-06-02 DIAGNOSIS — C4030 Malignant neoplasm of short bones of unspecified lower limb: Secondary | ICD-10-CM

## 2011-06-02 DIAGNOSIS — I10 Essential (primary) hypertension: Secondary | ICD-10-CM

## 2011-06-02 DIAGNOSIS — C439 Malignant melanoma of skin, unspecified: Secondary | ICD-10-CM

## 2011-06-02 DIAGNOSIS — E785 Hyperlipidemia, unspecified: Secondary | ICD-10-CM

## 2011-06-02 DIAGNOSIS — C4370 Malignant melanoma of unspecified lower limb, including hip: Secondary | ICD-10-CM | POA: Insufficient documentation

## 2011-06-02 HISTORY — DX: Malignant melanoma of unspecified lower limb, including hip: C43.70

## 2011-06-02 NOTE — Progress Notes (Signed)
This office note has been dictated.

## 2011-06-03 ENCOUNTER — Telehealth: Payer: Self-pay | Admitting: Hematology & Oncology

## 2011-06-03 NOTE — Telephone Encounter (Signed)
Mailed June 2013 schedule °

## 2011-06-03 NOTE — Progress Notes (Signed)
CC:   [name]. [name], M.D. Catalina Pizza, M.D.  DIAGNOSIS:  Stage I (T1a N0 M0) melanoma of the left foot.  CURRENT THERAPY:  Observation.  INTERIM HISTORY:  Mr. Caraway comes in for his 6-month office visit.  He is doing well.  He has multiple other medical problems.  He has diabetes.  He has hypertension and hyperlipidemia.  He did have LabBand surgery with some weight loss but it seems like he is having some issues with this now.  He was diagnosed back in October 2011.  He had a stage IA melanoma.  He did not require a sentinel node biopsy.  The melanoma itself was 0.5 mm in depth.  It was only Clark level II invasion.  He is feeling okay.  He is really having no complaints since we last saw him back in May.  PHYSICAL EXAMINATION:  This is a somewhat obese white gentleman in no obvious distress.  Vital signs:  Temperature of 98.2.  Pulse 78, respiratory rate 18, blood pressure 144/79.  Weight is 312.  Head and neck:  Normocephalic, atraumatic skull.  There are no ocular or oral lesions.  There are no palpable cervical or supraclavicular lymph nodes. Lungs:  Clear bilaterally.  Cardiac:  Regular rate and rhythm with normal S1, S2.  There are no murmurs, rubs or bruits.  Abdomen:  Soft with good bowel sounds.  No palpable abdominal mass.  There is no palpable hepatosplenomegaly.  He does have probable implantable port from his LapBand surgery.  There is no inguinal adenopathy bilaterally. Back:  No tenderness over the spine, ribs, or hips.  Extremities:  Some trace nonpitting edema of his left lower leg.  He said he recently had some cellulitis in that left lower leg.  He has a well-healed surgical wide local excision scar on the dorsum of the left foot.  Neurologic: No focal neurological deficits.  LABORATORY DATA:  Not done this visit.  IMPRESSION:  Mr. Zou is a really nice 66 year old gentleman with a stage IA (T1a N0 M0) melanoma of the left foot.  He is now a little  less than a year and a half from the diagnosis.  I would suspect that his recurrence is going to be less than 10%.  He has other health issues that, in my mind, will dictate his prognosis much more so than melanoma will.  Will go ahead and plan to get him back in another 5-6 months.  I do not see a need to do any lab work on him or x-ray on him in the interim.    ______________________________ Josph Macho, M.D. PRE/MEDQ  D:  06/02/2011  T:  06/03/2011  Job:  [id number]

## 2011-06-16 ENCOUNTER — Other Ambulatory Visit (HOSPITAL_COMMUNITY): Payer: Self-pay | Admitting: Family Medicine

## 2011-06-16 ENCOUNTER — Ambulatory Visit (HOSPITAL_COMMUNITY)
Admission: RE | Admit: 2011-06-16 | Discharge: 2011-06-16 | Disposition: A | Discharge status: Home / Self Care | Payer: BC Managed Care – PPO | Source: Ambulatory Visit | Attending: Family Medicine | Admitting: Family Medicine

## 2011-06-16 DIAGNOSIS — R079 Chest pain, unspecified: Secondary | ICD-10-CM

## 2011-06-16 DIAGNOSIS — I2510 Atherosclerotic heart disease of native coronary artery without angina pectoris: Secondary | ICD-10-CM

## 2011-06-24 HISTORY — PX: NM MYOCAR PERF WALL MOTION: HXRAD629

## 2011-10-22 ENCOUNTER — Other Ambulatory Visit (HOSPITAL_BASED_OUTPATIENT_CLINIC_OR_DEPARTMENT_OTHER): Payer: BC Managed Care – PPO | Admitting: Lab

## 2011-10-22 ENCOUNTER — Telehealth: Payer: Self-pay | Admitting: Hematology & Oncology

## 2011-10-22 ENCOUNTER — Ambulatory Visit (HOSPITAL_BASED_OUTPATIENT_CLINIC_OR_DEPARTMENT_OTHER): Payer: BC Managed Care – PPO | Admitting: Hematology & Oncology

## 2011-10-22 VITALS — BP 137/77 | HR 89 | Temp 97.7°F | Ht 70.0 in | Wt 312.0 lb

## 2011-10-22 DIAGNOSIS — C4370 Malignant melanoma of unspecified lower limb, including hip: Secondary | ICD-10-CM

## 2011-10-22 DIAGNOSIS — C439 Malignant melanoma of skin, unspecified: Secondary | ICD-10-CM

## 2011-10-22 LAB — CBC WITH DIFFERENTIAL (CANCER CENTER ONLY)
BASO%: 0.5 % (ref 0.0–2.0)
Eosinophils Absolute: 0.1 10*3/uL (ref 0.0–0.5)
HCT: 42.4 % (ref 38.7–49.9)
LYMPH#: 1.7 10*3/uL (ref 0.9–3.3)
LYMPH%: 30.5 % (ref 14.0–48.0)
MCV: 96 fL (ref 82–98)
MONO#: 0.6 10*3/uL (ref 0.1–0.9)
NEUT%: 56.9 % (ref 40.0–80.0)
RBC: 4.41 10*6/uL (ref 4.20–5.70)
WBC: 5.6 10*3/uL (ref 4.0–10.0)

## 2011-10-22 LAB — LACTATE DEHYDROGENASE: LDH: 190 U/L (ref 94–250)

## 2011-10-22 LAB — COMPREHENSIVE METABOLIC PANEL
ALT: 25 U/L (ref 0–53)
CO2: 29 mEq/L (ref 19–32)
Calcium: 9.6 mg/dL (ref 8.4–10.5)
Chloride: 102 mEq/L (ref 96–112)
Glucose, Bld: 192 mg/dL — ABNORMAL HIGH (ref 70–99)
Sodium: 142 mEq/L (ref 135–145)
Total Bilirubin: 0.4 mg/dL (ref 0.3–1.2)
Total Protein: 6.9 g/dL (ref 6.0–8.3)

## 2011-10-22 NOTE — Telephone Encounter (Signed)
Mailed December schedule °

## 2011-10-22 NOTE — Progress Notes (Signed)
This office note has been dictated.

## 2011-10-23 NOTE — Progress Notes (Signed)
CC:   Catalina Pizza, M.D. [name] [name], M.D.  DIAGNOSIS:  Stage I (T1a N0 M0) melanoma of the left foot (dorsum of the foot).  CURRENT THERAPY:  Observation.  INTERIM HISTORY:  Kevin [name] comes in for followup.  He is doing well. He has had no problems since we last saw him.  He has not lost much weight.  He did have lap band surgery.  He still working.  He is going to retire next March.  He has had no problems with leg swelling.  He has had no leg pain.  He has not noticed any unusual nodules under the skin.  He has had no fevers, sweats or chills.  He has had no cough.  He has had no headache.  PHYSICAL EXAMINATION:  This is a well-developed, well-nourished white gentleman in no obvious distress.  Vital signs:  97.9, pulse 89, respiratory rate 24, blood pressure 137/77.  Weight is 309.  Head and neck:  Normocephalic, atraumatic skull.  There are no ocular or oral lesions.  There are no palpable cervical or supraclavicular lymph nodes. Lungs:  Clear bilaterally.  Cardiac:  Regular rate and rhythm with a normal S1 and S2.  There are no murmurs, rubs or bruits.  Abdomen:  Soft with good bowel sounds.  There is no palpable abdominal mass.  There is no fluid wave.  There is no palpable hepatosplenomegaly.  Back:  No tenderness over the spine, ribs, or hips.  Extremities:  Wide local excision scar on the dorsum of his left foot.  He has minimal swelling of the left foot.  He has good pulses in his distal extremities.  Skin: No rashes, ecchymoses or petechia.  Neurological:  Shows no focal neurological deficits.  LABORATORY STUDIES:  White cell count of 5.6, hemoglobin 15.1, hematocrit 42.4, platelet count is 198.  IMPRESSION:  Kevin [name] is an 66 year old gentleman with a stage I melanoma of the left foot.  He underwent surgery back in October 2011. He did not require a sentinel node biopsy.  I do not see any evidence of recurrent disease.  I do not see that we need to do any  scans on him.  We will go ahead and plan to get him back to see [place] in 6 more months for followup.   ______________________________ Josph Macho, M.D. PRE/MEDQ  D:  10/22/2011  T:  10/23/2011  Job:  [id number]

## 2012-02-18 ENCOUNTER — Other Ambulatory Visit (HOSPITAL_COMMUNITY): Payer: Self-pay | Admitting: Internal Medicine

## 2012-02-18 DIAGNOSIS — R51 Headache: Secondary | ICD-10-CM

## 2012-02-20 ENCOUNTER — Ambulatory Visit (HOSPITAL_COMMUNITY): Admission: RE | Admit: 2012-02-20 | Payer: BC Managed Care – PPO | Source: Ambulatory Visit

## 2012-04-22 ENCOUNTER — Other Ambulatory Visit (HOSPITAL_BASED_OUTPATIENT_CLINIC_OR_DEPARTMENT_OTHER): Payer: BC Managed Care – PPO | Admitting: Lab

## 2012-04-22 ENCOUNTER — Ambulatory Visit (HOSPITAL_BASED_OUTPATIENT_CLINIC_OR_DEPARTMENT_OTHER): Payer: BC Managed Care – PPO | Admitting: Hematology & Oncology

## 2012-04-22 VITALS — BP 128/75 | HR 96 | Temp 97.0°F | Resp 20 | Ht 70.0 in | Wt 307.0 lb

## 2012-04-22 DIAGNOSIS — C4370 Malignant melanoma of unspecified lower limb, including hip: Secondary | ICD-10-CM

## 2012-04-22 LAB — COMPREHENSIVE METABOLIC PANEL
ALT: 24 U/L (ref 0–53)
AST: 21 U/L (ref 0–37)
Albumin: 4.6 g/dL (ref 3.5–5.2)
CO2: 27 mEq/L (ref 19–32)
Calcium: 9.8 mg/dL (ref 8.4–10.5)
Chloride: 101 mEq/L (ref 96–112)
Creatinine, Ser: 1.2 mg/dL (ref 0.50–1.35)
Potassium: 4.2 mEq/L (ref 3.5–5.3)
Sodium: 141 mEq/L (ref 135–145)
Total Protein: 7.1 g/dL (ref 6.0–8.3)

## 2012-04-22 LAB — CBC WITH DIFFERENTIAL (CANCER CENTER ONLY)
BASO%: 0.3 % (ref 0.0–2.0)
EOS%: 2.4 % (ref 0.0–7.0)
HCT: 43.7 % (ref 38.7–49.9)
LYMPH#: 1.6 10*3/uL (ref 0.9–3.3)
MCHC: 34.6 g/dL (ref 32.0–35.9)
MONO#: 0.6 10*3/uL (ref 0.1–0.9)
NEUT#: 3.6 10*3/uL (ref 1.5–6.5)
Platelets: 171 10*3/uL (ref 145–400)
RDW: 13.4 % (ref 11.1–15.7)
WBC: 5.9 10*3/uL (ref 4.0–10.0)

## 2012-04-22 LAB — LACTATE DEHYDROGENASE: LDH: 192 U/L (ref 94–250)

## 2012-04-22 NOTE — Progress Notes (Signed)
This office note has been dictated.

## 2012-04-23 NOTE — Progress Notes (Signed)
CC:   Kevin [name]. Kevin Gambler, MD [name] [name], M.D. [name], M.D.  DIAGNOSIS:  Stage IA (T1a N0 M0) melanoma of the left foot.  CURRENT THERAPY:  Observation.  INTERIM HISTORY:  Kevin [name] comes in for followup.  We see him every 6 months.  Since then, he has been doing well.  He is going to retire in March.  He is looking forward to this.  He still has the port under the anterior abdominal wall, which he says helps with his eating.  He has had no nodules in the left leg.  He has not noted any swelling in the left leg.  There has been no fever.  He has had no bleeding.  He has had no headache.  PHYSICAL EXAMINATION:  General:  This is a somewhat obese white gentleman in no obvious distress.  Vital signs:  97.9, pulse 96, respiratory rate 20, blood pressure 128/75.  Weight is 307.  Head and neck:  Normocephalic, atraumatic skull.  There are no ocular or oral lesions.  There are no palpable cervical or supraclavicular lymph nodes. Lungs:  Clear bilaterally.  Cardiac:  Regular rate and rhythm with a normal S1 and S2.  There are no murmurs, rubs, or bruits.  Abdomen: Soft with good bowel sounds.  There is no palpable abdominal mass. There is no fluid wave.  He does have the Port-A-Cath under the anterior abdominal wall just to the right of the midline in the upper abdomen. There is no palpable hepatosplenomegaly.  Extremities:  No clubbing, cyanosis, or edema.  Neurological:  No focal neurological deficits.  LABORATORY STUDIES:  White cell count 5.9, hemoglobin 15.1, hematocrit 43.7, platelet count 171.  IMPRESSION:  Kevin [name] is a 66 year old gentleman with stage IA melanoma of the dorsum of the left foot.  This was resected.  There is no indication for any type of adjuvant therapy.  He underwent surgery back in I think October 2011.  We will continue to get him back every 6 months.  I do not see any need for any scans or other lab work in between  visits.  ADDENDUM:  Kevin [name] is going to have knee surgery next year.  I do not see any problems from my point of view with him having his knee surgery.    ______________________________ Kevin [name], M.D. PRE/MEDQ  D:  04/22/2012  T:  04/23/2012  Job:  [id number]

## 2012-09-13 ENCOUNTER — Ambulatory Visit (HOSPITAL_COMMUNITY)
Admission: RE | Admit: 2012-09-13 | Discharge: 2012-09-13 | Disposition: A | Discharge status: Home / Self Care | Payer: Medicare Other | Source: Ambulatory Visit | Attending: Family Medicine | Admitting: Family Medicine

## 2012-09-13 ENCOUNTER — Other Ambulatory Visit (HOSPITAL_COMMUNITY): Payer: Self-pay | Admitting: Family Medicine

## 2012-09-13 DIAGNOSIS — I10 Essential (primary) hypertension: Secondary | ICD-10-CM | POA: Insufficient documentation

## 2012-09-13 DIAGNOSIS — E785 Hyperlipidemia, unspecified: Secondary | ICD-10-CM

## 2012-09-13 DIAGNOSIS — Z0000 Encounter for general adult medical examination without abnormal findings: Secondary | ICD-10-CM

## 2012-09-23 ENCOUNTER — Other Ambulatory Visit: Payer: Self-pay | Admitting: *Deleted

## 2012-09-23 MED ORDER — NIACIN ER (ANTIHYPERLIPIDEMIC) 500 MG PO TBCR: 1000.0000 mg | EXTENDED_RELEASE_TABLET | Freq: Every day | ORAL | Status: DC

## 2012-10-21 ENCOUNTER — Telehealth: Payer: Self-pay | Admitting: Hematology & Oncology

## 2012-10-21 ENCOUNTER — Ambulatory Visit: Payer: BC Managed Care – PPO | Admitting: Hematology & Oncology

## 2012-10-21 ENCOUNTER — Other Ambulatory Visit: Payer: BC Managed Care – PPO | Admitting: Lab

## 2012-10-21 NOTE — Telephone Encounter (Signed)
Patient called and cx 10/21/12 and resch for 11/19/12

## 2012-11-19 ENCOUNTER — Other Ambulatory Visit (HOSPITAL_BASED_OUTPATIENT_CLINIC_OR_DEPARTMENT_OTHER): Payer: Medicare Other | Admitting: Lab

## 2012-11-19 ENCOUNTER — Ambulatory Visit (HOSPITAL_BASED_OUTPATIENT_CLINIC_OR_DEPARTMENT_OTHER): Payer: Medicare Other | Admitting: Hematology & Oncology

## 2012-11-19 VITALS — BP 118/53 | HR 79 | Temp 98.1°F | Resp 18 | Ht 70.0 in | Wt 291.0 lb

## 2012-11-19 DIAGNOSIS — C4370 Malignant melanoma of unspecified lower limb, including hip: Secondary | ICD-10-CM

## 2012-11-19 DIAGNOSIS — C4372 Malignant melanoma of left lower limb, including hip: Secondary | ICD-10-CM

## 2012-11-19 LAB — CBC WITH DIFFERENTIAL (CANCER CENTER ONLY)
BASO#: 0 10*3/uL (ref 0.0–0.2)
BASO%: 0.7 % (ref 0.0–2.0)
EOS%: 2.7 % (ref 0.0–7.0)
HGB: 15.1 g/dL (ref 13.0–17.1)
MCH: 34.2 pg — ABNORMAL HIGH (ref 28.0–33.4)
MCHC: 34.6 g/dL (ref 32.0–35.9)
MONO%: 9.7 % (ref 0.0–13.0)
NEUT#: 3.3 10*3/uL (ref 1.5–6.5)
RDW: 12.9 % (ref 11.1–15.7)

## 2012-11-19 LAB — COMPREHENSIVE METABOLIC PANEL
ALT: 17 U/L (ref 0–53)
AST: 14 U/L (ref 0–37)
Alkaline Phosphatase: 35 U/L — ABNORMAL LOW (ref 39–117)
BUN: 19 mg/dL (ref 6–23)
Creatinine, Ser: 1.41 mg/dL — ABNORMAL HIGH (ref 0.50–1.35)
Potassium: 4.3 mEq/L (ref 3.5–5.3)

## 2012-11-19 NOTE — Progress Notes (Signed)
This office note has been dictated.

## 2012-11-20 NOTE — Progress Notes (Signed)
CC:   Kevin [name]. Kevin Gambler, MD Kevin [name] [name], M.D.  DIAGNOSIS:  Stage IA (T1a N0 M0) melanoma of the left foot.  CURRENT THERAPY:  Observation.  INTERIM HISTORY:  [name] comes in for followup.  He has been doing quite well.  He has lost 16 pounds since we last saw him.  He is trying to exercise a little bit more.  He has had no problems with left foot or leg swelling.  He has had no problems going to the bathroom.  He has had no cough or shortness breath.  He is retired now.  He is enjoying his retirement.  He has not noted any bleeding or bruising.  There has been no headache.  PHYSICAL EXAM:  General:  This is an obese white gentleman in no obvious distress.  Vital Signs:  Temperature of 98.1, pulse 79, respiratory rate 18, blood pressure 118/53.  Weight is 291.  Head and Neck:  Show a normocephalic, atraumatic skull.  There are no ocular or oral lesions. There are no palpable cervical, supraclavicular lymph nodes.  Lungs: Clear bilaterally.  Cardiac:  Regular rate and rhythm with a normal S1 and S2.  There are no murmurs, rubs, or bruits.  Abdomen:  Obese.  His abdomen is soft.  He has good bowel sounds.  There is no palpable abdominal mass.  There is no palpable hepatosplenomegaly.  Extremities: Show a well-healed wide local excision on the left foot.  This is on the dorsum of the left foot.  He has some mild nonpitting edema of the left leg.  He has good range motion of his joints.  Skin:  Shows no rashes, ecchymosis, or petechia.  LABORATORY STUDIES:  White cell count is 5.9.  Hemoglobin 15.1, hematocrit 43.7, platelet count 166.  IMPRESSION:  [name] is a 67 year old gentleman with a resected stage I melanoma of the left foot.  He underwent resection back in October 2011.  I really think that we can go yearly now.  I told [name] that if he has any problems in between visits, we can always get him in and do appropriate tests on  him.   ______________________________ Josph Macho, M.D. PRE/MEDQ  D:  11/19/2012  T:  11/20/2012  Job:  [id number]

## 2012-12-29 ENCOUNTER — Other Ambulatory Visit: Payer: Self-pay | Admitting: *Deleted

## 2012-12-29 MED ORDER — CLOPIDOGREL BISULFATE 75 MG PO TABS: 75.0000 mg | ORAL_TABLET | Freq: Every day | ORAL | Status: DC

## 2012-12-29 NOTE — Telephone Encounter (Signed)
Rx was sent to pharmacy electronically. 

## 2013-03-30 ENCOUNTER — Other Ambulatory Visit (HOSPITAL_COMMUNITY): Payer: Self-pay | Admitting: Family Medicine

## 2013-03-30 DIAGNOSIS — N183 Chronic kidney disease, stage 3 unspecified: Secondary | ICD-10-CM

## 2013-04-04 ENCOUNTER — Ambulatory Visit (HOSPITAL_COMMUNITY)
Admission: RE | Admit: 2013-04-04 | Discharge: 2013-04-04 | Disposition: A | Discharge status: Home / Self Care | Payer: Medicare Other | Source: Ambulatory Visit | Attending: Family Medicine | Admitting: Family Medicine

## 2013-04-04 DIAGNOSIS — I129 Hypertensive chronic kidney disease with stage 1 through stage 4 chronic kidney disease, or unspecified chronic kidney disease: Secondary | ICD-10-CM | POA: Insufficient documentation

## 2013-04-04 DIAGNOSIS — N183 Chronic kidney disease, stage 3 unspecified: Secondary | ICD-10-CM | POA: Insufficient documentation

## 2013-04-04 DIAGNOSIS — E119 Type 2 diabetes mellitus without complications: Secondary | ICD-10-CM | POA: Insufficient documentation

## 2013-04-07 ENCOUNTER — Encounter: Payer: Self-pay | Admitting: Cardiovascular Disease

## 2013-04-07 ENCOUNTER — Ambulatory Visit (INDEPENDENT_AMBULATORY_CARE_PROVIDER_SITE_OTHER): Payer: Medicare Other | Admitting: Cardiovascular Disease

## 2013-04-07 VITALS — BP 120/76 | HR 77 | Ht 69.5 in | Wt 290.9 lb

## 2013-04-07 DIAGNOSIS — I10 Essential (primary) hypertension: Secondary | ICD-10-CM | POA: Insufficient documentation

## 2013-04-07 DIAGNOSIS — E782 Mixed hyperlipidemia: Secondary | ICD-10-CM | POA: Insufficient documentation

## 2013-04-07 DIAGNOSIS — I2510 Atherosclerotic heart disease of native coronary artery without angina pectoris: Secondary | ICD-10-CM

## 2013-04-07 DIAGNOSIS — E785 Hyperlipidemia, unspecified: Secondary | ICD-10-CM

## 2013-04-07 DIAGNOSIS — E119 Type 2 diabetes mellitus without complications: Secondary | ICD-10-CM | POA: Insufficient documentation

## 2013-04-07 NOTE — Progress Notes (Signed)
04/07/2013 Kevin [name]   10/27/1945  [id number]  Primary Physician Cassell Smiles., MD Primary Cardiologist: Runell Gess MD [name]   HPI:  The patient is a very pleasant 67 year old severely overweight divorced Caucasian male, father of 1 child, who I last saw in the office 6 months ago. 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 He has a history of CAD, status post remote LAD and circumflex stenting in 2001 and 2004 respectively by Drs. Charlies Constable and Geralynn Rile. He stopped smoking and drinking at that time as well. His other problems include hypertension, hyperlipidemia, and non-insulin-requiring diabetes. He also has obstructive sleep apnea, on CPAP. He has had lap-banding in the past with ultimate reaccumulation of his weight. He has had endovenous ablation by Dr. Lynnea Ferrier of his left greater saphenous vein, which really did not afford significant clinical improvement. He had a functional study performed December 22, 2008, which was nonischemic, and another one June 24, 2011, because of chest pain, which was low risk. He has been asymptomatic. His last lab work performed in December revealed a total cholesterol of 188, LDL of 86, and HDL of 47. Since I saw him 6 months ago he has been asymptomatic    Current Outpatient Prescriptions  Medication Sig Dispense Refill  . Ascorbic Acid (VITAMIN C) 500 MG CAPS Take by mouth every morning.      . clopidogrel (PLAVIX) 75 MG tablet Take 1 tablet (75 mg total) by mouth daily.  30 tablet  9  . FENOFIBRATE PO Take by mouth daily.      [name] linagliptin (TRADJENTA) 5 MG TABS tablet Take 5 mg by mouth daily.      . metFORMIN (GLUCOPHAGE) 1000 MG tablet Take 500 mg by mouth 2 (two) times daily with a meal.       . metoprolol (TOPROL-XL) 100 MG 24 hr tablet Take 100 mg by mouth daily.        . pravastatin (PRAVACHOL) 40 MG tablet Take 40 mg by mouth daily.      . valsartan-hydrochlorothiazide  (DIOVAN-HCT) 160-12.5 MG per tablet Take 1 tablet by mouth daily.         No current facility-administered medications for this visit.    Allergies  Allergen Reactions  . Avapro [Irbesartan]   . Cephalexin   . Clindamycin Hcl   . Codeine   . Cortizone-5 [Hydrocortisone Base]   . Indomethacin   . Minocycline Hcl   . Morphine And Related   . Penicillins   . Prednisone   . Ramipril   . Septra [Bactrim]   . Sulfa Drugs Cross Reactors   . Tetracyclines & Related     History   Social History  . Marital Status: Divorced    Spouse Name: N/A    Number of Children: 1  . Years of Education: N/A   Occupational History  . surveyor        Social History Main Topics  . Smoking status: Former Smoker -- 3.00 packs/day for 60 years    Types: Cigarettes    Quit date: 10/04/1990  . Smokeless tobacco: Never Used  . Alcohol Use: Yes     Comment: 3-4 times/year  . Drug Use: No  . Sexual Activity: Not Currently   Other Topics Concern  . Not on file   Social History Narrative  . No narrative on file     Review of Systems: General: negative for chills,  fever, night sweats or weight changes.  Cardiovascular: negative for chest pain, dyspnea on exertion, edema, orthopnea, palpitations, paroxysmal nocturnal dyspnea or shortness of breath Dermatological: negative for rash Respiratory: negative for cough or wheezing Urologic: negative for hematuria Abdominal: negative for nausea, vomiting, diarrhea, bright red blood per rectum, melena, or hematemesis Neurologic: negative for visual changes, syncope, or dizziness All other systems reviewed and are otherwise negative except as noted above.    Blood pressure 120/76, pulse 77, height 5' 9.5" (1.765 m), weight 290 lb 14.4 oz (131.951 kg).  General appearance: alert and no distress Neck: no adenopathy, no carotid bruit, no JVD, supple, symmetrical, trachea midline and thyroid not enlarged, symmetric, no  tenderness/mass/nodules Lungs: clear to auscultation bilaterally Heart: regular rate and rhythm, S1, S2 normal, no murmur, click, rub or gallop Extremities: extremities normal, atraumatic, no cyanosis or edema  EKG normal sinus rhythm at 77 without ST or T wave changes  ASSESSMENT AND PLAN:   Coronary artery disease Status post LAD and circumflex stenting by Dr. Juanda Chance and Dr. Joycelyn Rua in 2001 in 2004 respectively. His last catheterization was performed by Dr. Jacinto Halim 06/11/07 revealing patent stents in the LAD and circumflex which is a dominant vessel. He denies chest pain or shortness of breath.  Essential hypertension Well-controlled on current medications  Hyperlipidemia On statin therapy followed by his PCP      Runell Gess MD [facility] 04/07/2013 12:39 PM

## 2013-04-07 NOTE — Patient Instructions (Signed)
Your physician recommends that you schedule a follow-up appointment in: 12 with Dr Allyson Sabal  Your physician recommends that you schedule a follow-up appointment in: 6 months with PA

## 2013-04-07 NOTE — Assessment & Plan Note (Signed)
Status post LAD and circumflex stenting by Dr. Juanda Chance and Dr. Joycelyn Rua in 2001 in 2004 respectively. His last catheterization was performed by Dr. Jacinto Halim 06/11/07 revealing patent stents in the LAD and circumflex which is a dominant vessel. He denies chest pain or shortness of breath.

## 2013-04-07 NOTE — Assessment & Plan Note (Signed)
Well-controlled on current medications 

## 2013-04-07 NOTE — Assessment & Plan Note (Signed)
On statin therapy followed by his PCP 

## 2013-04-08 ENCOUNTER — Encounter: Payer: Self-pay | Admitting: Cardiovascular Disease

## 2013-05-18 ENCOUNTER — Non-Acute Institutional Stay (SKILLED_NURSING_FACILITY): Payer: Medicare Other | Admitting: Family

## 2013-05-18 DIAGNOSIS — I10 Essential (primary) hypertension: Secondary | ICD-10-CM

## 2013-05-18 DIAGNOSIS — K219 Gastro-esophageal reflux disease without esophagitis: Secondary | ICD-10-CM

## 2013-05-18 DIAGNOSIS — E785 Hyperlipidemia, unspecified: Secondary | ICD-10-CM

## 2013-05-18 DIAGNOSIS — M19019 Primary osteoarthritis, unspecified shoulder: Secondary | ICD-10-CM

## 2013-07-03 DIAGNOSIS — I5032 Chronic diastolic (congestive) heart failure: Secondary | ICD-10-CM

## 2013-07-03 HISTORY — DX: Chronic diastolic (congestive) heart failure: I50.32

## 2013-07-14 ENCOUNTER — Ambulatory Visit (INDEPENDENT_AMBULATORY_CARE_PROVIDER_SITE_OTHER): Payer: Medicare Other | Admitting: Cardiovascular Disease

## 2013-07-14 ENCOUNTER — Encounter: Payer: Self-pay | Admitting: Cardiology

## 2013-07-14 ENCOUNTER — Other Ambulatory Visit: Payer: Self-pay | Admitting: *Deleted

## 2013-07-14 VITALS — BP 120/68 | HR 81 | Ht 69.0 in | Wt 284.2 lb

## 2013-07-14 DIAGNOSIS — R5381 Other malaise: Secondary | ICD-10-CM

## 2013-07-14 DIAGNOSIS — Z01810 Encounter for preprocedural cardiovascular examination: Secondary | ICD-10-CM

## 2013-07-14 DIAGNOSIS — I10 Essential (primary) hypertension: Secondary | ICD-10-CM

## 2013-07-14 DIAGNOSIS — E785 Hyperlipidemia, unspecified: Secondary | ICD-10-CM

## 2013-07-14 DIAGNOSIS — Z01818 Encounter for other preprocedural examination: Secondary | ICD-10-CM

## 2013-07-14 DIAGNOSIS — Z79899 Other long term (current) drug therapy: Secondary | ICD-10-CM

## 2013-07-14 DIAGNOSIS — D689 Coagulation defect, unspecified: Secondary | ICD-10-CM

## 2013-07-14 DIAGNOSIS — E119 Type 2 diabetes mellitus without complications: Secondary | ICD-10-CM

## 2013-07-14 DIAGNOSIS — I2510 Atherosclerotic heart disease of native coronary artery without angina pectoris: Secondary | ICD-10-CM

## 2013-07-14 DIAGNOSIS — R5383 Other fatigue: Secondary | ICD-10-CM

## 2013-07-14 DIAGNOSIS — D750 Familial erythrocytosis: Secondary | ICD-10-CM

## 2013-07-14 DIAGNOSIS — I200 Unstable angina: Secondary | ICD-10-CM | POA: Insufficient documentation

## 2013-07-14 DIAGNOSIS — R609 Edema, unspecified: Secondary | ICD-10-CM

## 2013-07-14 DIAGNOSIS — R600 Localized edema: Secondary | ICD-10-CM

## 2013-07-14 LAB — CBC WITH DIFFERENTIAL/PLATELET
BASOS ABS: 0 10*3/uL (ref 0.0–0.1)
Basophils Relative: 1 % (ref 0–1)
EOS PCT: 2 % (ref 0–5)
Eosinophils Absolute: 0.1 10*3/uL (ref 0.0–0.7)
HCT: 43.5 % (ref 39.0–52.0)
Hemoglobin: 15.3 g/dL (ref 13.0–17.0)
Lymphocytes Relative: 26 % (ref 12–46)
Lymphs Abs: 1.2 10*3/uL (ref 0.7–4.0)
MCH: 32.3 pg (ref 26.0–34.0)
MCHC: 35.2 g/dL (ref 30.0–36.0)
MCV: 92 fL (ref 78.0–100.0)
Monocytes Absolute: 0.4 10*3/uL (ref 0.1–1.0)
Monocytes Relative: 9 % (ref 3–12)
NEUTROS ABS: 3 10*3/uL (ref 1.7–7.7)
Neutrophils Relative %: 62 % (ref 43–77)
PLATELETS: 210 10*3/uL (ref 150–400)
RBC: 4.73 MIL/uL (ref 4.22–5.81)
RDW: 13.3 % (ref 11.5–15.5)
WBC: 4.8 10*3/uL (ref 4.0–10.5)

## 2013-07-14 LAB — COMPLETE METABOLIC PANEL WITH GFR
ALK PHOS: 34 U/L — AB (ref 39–117)
ALT: 20 U/L (ref 0–53)
AST: 18 U/L (ref 0–37)
Albumin: 4.6 g/dL (ref 3.5–5.2)
BUN: 23 mg/dL (ref 6–23)
CALCIUM: 9.9 mg/dL (ref 8.4–10.5)
CO2: 27 mEq/L (ref 19–32)
CREATININE: 1.31 mg/dL (ref 0.50–1.35)
Chloride: 103 mEq/L (ref 96–112)
GFR, Est African American: 65 mL/min
GFR, Est Non African American: 56 mL/min — ABNORMAL LOW
Glucose, Bld: 113 mg/dL — ABNORMAL HIGH (ref 70–99)
Potassium: 4.8 mEq/L (ref 3.5–5.3)
Sodium: 143 mEq/L (ref 135–145)
Total Bilirubin: 0.5 mg/dL (ref 0.2–1.2)
Total Protein: 7 g/dL (ref 6.0–8.3)

## 2013-07-14 LAB — URINALYSIS, MICROSCOPIC ONLY
BACTERIA UA: NONE SEEN
CASTS: NONE SEEN
CRYSTALS: NONE SEEN
SQUAMOUS EPITHELIAL / LPF: NONE SEEN

## 2013-07-14 LAB — APTT: aPTT: 27 seconds (ref 24–37)

## 2013-07-14 LAB — PROTIME-INR
INR: 0.92 (ref <1.50)
PROTHROMBIN TIME: 12.3 s (ref 11.6–15.2)

## 2013-07-14 MED ORDER — NITROGLYCERIN 0.4 MG SL SUBL: 0.4000 mg | SUBLINGUAL_TABLET | SUBLINGUAL | Status: DC | PRN

## 2013-07-14 MED ORDER — ISOSORBIDE MONONITRATE ER 30 MG PO TB24: 15.0000 mg | ORAL_TABLET | Freq: Every day | ORAL | Status: DC

## 2013-07-14 NOTE — Progress Notes (Signed)
07/14/2013   PCP: Glo Herring., MD   Chief Complaint  Patient presents with  . Shortness of Breath    dyspnea past two weeks, back pain, swelling in legs and feet    Primary Cardiologist: Dr. 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 He has a history of CAD, status post remote LAD and circumflex stenting in 2001 and 2004 respectively by Drs. [name] and [name]. He stopped smoking and drinking at that time as well. His other problems include hypertension, hyperlipidemia, and non-insulin-requiring diabetes. He also has obstructive sleep apnea, on CPAP. He has had lap-banding in the past with ultimate reaccumulation of his weight. He has had endovenous ablation by Dr. Elisabeth Cara of his left greater saphenous vein, which really did not afford significant clinical improvement. He had a functional study performed December 22, 2008, which was nonischemic, and another one June 24, 2011, because of chest pain, which was low risk. His last lab work performed in Nov. 2014 revealed a total cholesterol of 188, LDL of 73, and HDL of 35.   He presents as a walk in today due to pain last pm.  Two weeks ago at rest had episode on mid back pain and SOB that did not last long.  Yesterday carrying a basket of clothes up stairs he developed severe SOB and mid back pain between scapula that was like his pain prior to stents.  He had no associated symptoms of nausea or diaphoresis.  He cooled off and rested X 15 min, but did not have NTG to take. He though he was going to die.  He did not call 911 because it did resolve.  If it had returned he would have called 911.  He still not his usual but no more pain or SOB.  Also with lower ext edema- he had stopped lasix due to renal abnormality.    Allergies  Allergen Reactions  . Avapro [Irbesartan]   . Cephalexin   .  Clindamycin Hcl   . Codeine   . Cortizone-5 [Hydrocortisone Base]   . Indomethacin   . Minocycline Hcl   . Morphine And Related   . Penicillins   . Prednisone   . Ramipril   . Septra [Bactrim]   . Sulfa Drugs Cross Reactors   . Tetracyclines & Related     Current Outpatient Prescriptions  Medication Sig Dispense Refill  . Ascorbic Acid (VITAMIN C) 500 MG CAPS Take by mouth every morning.      [name] aspirin 81 MG tablet Take 81 mg by mouth daily.      . Canagliflozin (INVOKANA) 100 MG TABS Take 100 mg by mouth daily.      . clopidogrel (PLAVIX) 75 MG tablet Take 1 tablet (75 mg total) by mouth daily.  30 tablet  9  . fluticasone (VERAMYST) 27.5 MCG/SPRAY nasal spray Place 2 sprays into the nose daily.      [name] linagliptin (TRADJENTA) 5 MG TABS tablet Take 5 mg by mouth daily.      . metoprolol (TOPROL-XL) 100 MG 24 hr tablet Take 100 mg by mouth daily.        . niacin (NIASPAN) 1000 MG CR tablet Take 1,000 mg by mouth at bedtime.      . pravastatin (PRAVACHOL) 40 MG tablet Take 40 mg by mouth daily.      [name]  valsartan-hydrochlorothiazide (DIOVAN-HCT) 160-12.5 MG per tablet Take 1 tablet by mouth daily.        . isosorbide mononitrate (IMDUR) 30 MG 24 hr tablet Take 0.5 tablets (15 mg total) by mouth daily.  16 tablet  1  . nitroGLYCERIN (NITROSTAT) 0.4 MG SL tablet Place 1 tablet (0.4 mg total) under the tongue every 5 (five) minutes as needed for chest pain.  30 tablet  4   No current facility-administered medications for this visit.    Past Medical History  Diagnosis Date  . Melanoma 10/11    left foot  . Cellulitis 5/11    left leg  . CAD (coronary artery disease)   . HTN (hypertension)   . Diabetes mellitus   . Obesity   . Seasonal allergies   . S/P colonoscopy 07/03/03     Dr [name]->hyperplastic rectal polyp  . Diverticulosis   . Sleep apnea   . Microscopic colitis 10/09/10    Colonoscopy dr [name]  . Diverticulosis   . S/P endoscopy 10/09/10    lap band intact, duodenal  erosions  . Melanoma of foot 06/02/2011    Past Surgical History  Procedure Laterality Date  . Appendectomy    . Shoulder surgery      left  . Knee surgery      left  . Penile prosthesis implant    . Coronary angioplasty  2002/2005/2008    [id number] colds or fevers, no weight changes Skin:no rashes or ulcers HEENT:no blurred vision, no congestion CV:see HPI PUL:see HPI GI:no diarrhea constipation or melena, no indigestion GU:no hematuria, no dysuria MS:no joint pain, no claudication Neuro:no syncope, no lightheadedness Endo:+ diabetes, no thyroid disease  PHYSICAL EXAM BP 120/68  Pulse 81  Ht 5\' 9"  (1.753 m)  Wt 284 lb 3.2 oz (128.912 kg)  BMI 41.95 kg/m2 General:Pleasant affect, NAD, mildly anxious Skin:Warm and dry, brisk capillary refill HEENT:normocephalic, sclera clear, mucus membranes moist Neck:supple, no JVD, no bruits  Heart:S1S2 RRR without murmur, gallup, rub or click, no lymphadenopathy Lungs:clear without rales, rhonchi, or wheezes [id number], soft, non tender, + BS, do not palpate liver spleen or masses Ext:1-2+ lower ext edema, 2+ pedal pulses, 2+ radial pulses Neuro:alert and oriented, MAE, follows commands, + facial symmetry  EKG:  SR no acute changes from previous.  ASSESSMENT AND PLAN Accelerating angina Two episodes of angina, 1st two weeks ago at rest.  Yesterday another episode carrying basket of clothes up the stairs and developed pain between scapula and severe SOB, took 15 min to resolve he thought he was going to die.  He would have come to hospital if it had not resolved or if it had returned.  Has not felt well but no further pain or SOB since.  Plan for cardiac cath tomorrow. He did not have any NTG.  I refilled his NTG and placed on Imdur to prevent angina prior to the cath. This pain is just like his pain prior to stents.  Last cath 2009 with patent stents, last nuc was 2013 with no ischemia.  Dr. Sallyanne Kuster saw and evaluated the pt as  well.  Coronary artery disease, hx LAD and LCX stenting in 2001 & 2004, last cath 2009 with patent stents. See above note  Diabetes Followed by PCP  Essential hypertension stable  Hyperlipidemia Treated, Nov 2014 tchol 156, HDL 35; TG 241; LDL 73  Edema, lower extremity, bil Pt stopped lasix several months ago because his kidney function was abnormal, I see a cr. Of 1.46,  I will not restart lasix until after cath.    I have seen and examined the patient along with Cecilie Kicks, NP.  I have reviewed the chart, notes and new data.  I agree with PA/NP's note.  Symptoms identical to previous presentations with acute coronary sd. Will benefit from early angio and PCi as needed. This procedure has been fully reviewed with the patient and written informed consent has been obtained.   Sanda Klein, MD, [place] [phone] ,

## 2013-07-14 NOTE — Assessment & Plan Note (Signed)
See above note

## 2013-07-14 NOTE — Assessment & Plan Note (Signed)
Two episodes of angina, 1st two weeks ago at rest.  Yesterday another episode carrying basket of clothes up the stairs and developed pain between scapula and severe SOB, took 15 min to resolve he thought he was going to die.  He would have come to hospital if it had not resolved or if it had returned.  Has not felt well but no further pain or SOB since.  Plan for cardiac cath tomorrow. He did not have any NTG.  I refilled his NTG and placed on Imdur to prevent angina prior to the cath. This pain is just like his pain prior to stents.  Last cath 2009 with patent stents, last nuc was 2013 with no ischemia.  Dr. Sallyanne Kuster saw and evaluated the pt as well.

## 2013-07-14 NOTE — Assessment & Plan Note (Signed)
Treated, Nov 2014 tchol 156, HDL 35; TG 241; LDL 73

## 2013-07-14 NOTE — Assessment & Plan Note (Signed)
Followed by PCP

## 2013-07-14 NOTE — Patient Instructions (Signed)
We will schedule a cardiac cath for the AM.  If your pain returns and does not respond to NTG then go to ER.  Heart Healthy diabetic diet.  Take 1 NTG, under your tongue, while sitting.  If no relief of pain may repeat NTG, one tab every 5 minutes up to 3 tablets total over 15 minutes.  If no relief CALL 911.  If you have dizziness/lightheadness  while taking NTG, stop taking and call 911.        I added Imdur to your meds to prevent angina.  No heavy exertion until cardiac cath done.

## 2013-07-14 NOTE — Assessment & Plan Note (Signed)
stable °

## 2013-07-14 NOTE — Assessment & Plan Note (Signed)
Pt stopped lasix several months ago because his kidney function was abnormal, I see a cr. Of 1.46, I will not restart lasix until after cath.

## 2013-07-15 ENCOUNTER — Ambulatory Visit (HOSPITAL_COMMUNITY)
Admission: RE | Admit: 2013-07-15 | Discharge: 2013-07-15 | Disposition: A | Discharge status: Home / Self Care | Payer: Medicare Other | Source: Ambulatory Visit | Attending: Interventional Cardiology | Admitting: Interventional Cardiology

## 2013-07-15 ENCOUNTER — Encounter (HOSPITAL_COMMUNITY)
Admission: RE | Disposition: A | Discharge status: Home / Self Care | Payer: Self-pay | Source: Ambulatory Visit | Attending: Interventional Cardiology

## 2013-07-15 DIAGNOSIS — I5032 Chronic diastolic (congestive) heart failure: Secondary | ICD-10-CM | POA: Insufficient documentation

## 2013-07-15 DIAGNOSIS — E119 Type 2 diabetes mellitus without complications: Secondary | ICD-10-CM

## 2013-07-15 DIAGNOSIS — I2510 Atherosclerotic heart disease of native coronary artery without angina pectoris: Secondary | ICD-10-CM

## 2013-07-15 DIAGNOSIS — Z01810 Encounter for preprocedural cardiovascular examination: Secondary | ICD-10-CM

## 2013-07-15 DIAGNOSIS — I2582 Chronic total occlusion of coronary artery: Secondary | ICD-10-CM | POA: Insufficient documentation

## 2013-07-15 DIAGNOSIS — I200 Unstable angina: Secondary | ICD-10-CM

## 2013-07-15 DIAGNOSIS — Z9861 Coronary angioplasty status: Secondary | ICD-10-CM | POA: Insufficient documentation

## 2013-07-15 HISTORY — PX: LEFT HEART CATHETERIZATION WITH CORONARY ANGIOGRAM: SHX5451

## 2013-07-15 LAB — GLUCOSE, CAPILLARY
Glucose-Capillary: 118 mg/dL — ABNORMAL HIGH (ref 70–99)
Glucose-Capillary: 119 mg/dL — ABNORMAL HIGH (ref 70–99)

## 2013-07-15 SURGERY — LEFT HEART CATHETERIZATION WITH CORONARY ANGIOGRAM
Anesthesia: LOCAL

## 2013-07-15 MED ORDER — METHYLPREDNISOLONE SODIUM SUCC 125 MG IJ SOLR
80.0000 mg | Freq: Once | INTRAMUSCULAR | Status: AC
  Administered 2013-07-15: 80 mg via INTRAVENOUS

## 2013-07-15 MED ORDER — SODIUM CHLORIDE 0.9 % IJ SOLN: 3.0000 mL | INTRAMUSCULAR | Status: DC | PRN

## 2013-07-15 MED ORDER — HEPARIN (PORCINE) IN NACL 2-0.9 UNIT/ML-% IJ SOLN
INTRAMUSCULAR | Status: AC
  Filled 2013-07-15: qty 1000

## 2013-07-15 MED ORDER — FAMOTIDINE IN NACL 20-0.9 MG/50ML-% IV SOLN
20.0000 mg | Freq: Once | INTRAVENOUS | Status: AC
  Administered 2013-07-15: 20 mg via INTRAVENOUS
  Filled 2013-07-15: qty 50

## 2013-07-15 MED ORDER — HEPARIN SODIUM (PORCINE) 1000 UNIT/ML IJ SOLN
INTRAMUSCULAR | Status: AC
  Filled 2013-07-15: qty 1

## 2013-07-15 MED ORDER — SODIUM CHLORIDE 0.9 % IV SOLN
INTRAVENOUS | Status: DC
  Administered 2013-07-15: 06:00:00 via INTRAVENOUS

## 2013-07-15 MED ORDER — FENTANYL CITRATE 0.05 MG/ML IJ SOLN
INTRAMUSCULAR | Status: AC
  Filled 2013-07-15: qty 2

## 2013-07-15 MED ORDER — NITROGLYCERIN 0.2 MG/ML ON CALL CATH LAB
INTRAVENOUS | Status: AC
  Filled 2013-07-15: qty 1

## 2013-07-15 MED ORDER — MIDAZOLAM HCL 2 MG/2ML IJ SOLN
INTRAMUSCULAR | Status: AC
  Filled 2013-07-15: qty 2

## 2013-07-15 MED ORDER — VERAPAMIL HCL 2.5 MG/ML IV SOLN
INTRAVENOUS | Status: AC
  Filled 2013-07-15: qty 2

## 2013-07-15 MED ORDER — SODIUM CHLORIDE 0.9 % IV SOLN: INTRAVENOUS | Status: DC

## 2013-07-15 MED ORDER — LIDOCAINE HCL (PF) 1 % IJ SOLN
INTRAMUSCULAR | Status: AC
  Filled 2013-07-15: qty 30

## 2013-07-15 MED ORDER — ASPIRIN 81 MG PO CHEW
81.0000 mg | CHEWABLE_TABLET | ORAL | Status: AC
  Administered 2013-07-15: 81 mg via ORAL
  Filled 2013-07-15: qty 1

## 2013-07-15 MED ORDER — METHYLPREDNISOLONE SODIUM SUCC 125 MG IJ SOLR
INTRAMUSCULAR | Status: AC
  Administered 2013-07-15: 80 mg via INTRAVENOUS
  Filled 2013-07-15: qty 2

## 2013-07-15 NOTE — CV Procedure (Signed)
     Left Heart Catheterization with Coronary Angiography  Report  Kevin [name]  68 y.o.  male 1945-11-12  Procedure Date: 07/15/2013  Referring Physician: Dr. Gerarda Fraction Primary Cardiologist:: Quay Burow  INDICATIONS: Exertional dyspnea with prior history of coronary stenting of LAD and circumflex  PROCEDURE: 1. Left heart catheterization; 2. Coronary angiography; 3. Left ventriculography  CONSENT:  The risks, benefits, and details of the procedure were explained in detail to the patient. Risks including death, stroke, heart attack, kidney injury, allergy, limb ischemia, bleeding and radiation injury were discussed.  The patient verbalized understanding and wanted to proceed.  Informed written consent was obtained.  PROCEDURE TECHNIQUE:  After Xylocaine anesthesia a 5 French slender sheath was placed in the right radial artery with a single anterior needle wall stick.  Coronary angiography was done using 5 [place] JR 4 and JL 3.5 diagnostic catheters.  Left ventriculography was done using the JR 4 catheter and hand injection.   MEDICATIONS: 2 mg of IV Versed; fentanyl 50 mcg  CONTRAST:  Total of 90 cc.  COMPLICATIONS:  none   HEMODYNAMICS:  Aortic pressure 112/63 mmHg; LV pressure 111/10 mmHg; LVEDP 20  mmHg  ANGIOGRAPHIC DATA:   The left main coronary artery is widely patent.  The left anterior descending artery is widely patent including the proximal stent. There is persistent greater than 90% stenosis in the diagonal that is jailed by the previously placed stent..  The left circumflex artery is widely patent including the proximal and distal stents. The first obtuse marginal contains diffuse proximal to mid disease in the 50-70% range.no high-grade focal obstructions noted in the circumflex territory.  The right coronary artery is nondominant. Since the last study the vessel is now totally occluded at the site of previous segmental disease. Right to right collaterals are  noted.[name]   LEFT VENTRICULOGRAM:  Left ventricular angiogram was done in the 30 RAO projection and revealed normal systolic function with an LVEF greater than 55%. Elevated end-diastolic pressure.   IMPRESSIONS:  1. Total occlusion of the nondominant right coronary, a new finding compared with the most recent coronary angiogram from 2011.  2. Widely patent LAD and circumflex stents. Stable 90+ percent stenosis of the jailed diagonal arising from the stented LAD territory. The first obtuse marginal contains diffuse 50-70% proximal to mid disease.   3. Diastolic heart failure with LVEDP of 20 mm mercury. Perhaps the patient's symptoms of exertional dyspnea are related to poorly controlled chronic diastolic heart failure.   RECOMMENDATION:  Continue current medical regimen. Consider advancing therapy for diastolic heart failure. Consider pulmonary evaluation.[name]

## 2013-07-15 NOTE — H&P (Signed)
Patient's history and physical was reviewed. The patient was interviewed and examined. The procedure and risks of stroke, death, myocardial infarction, kidney injury, limb ischemia, bleeding, among others were discussed in detail except above the patient. The previous 3 angiograms were reviewed prior to starting the case. All questions being answered the patient has consented to proceed with catheterization.

## 2013-07-15 NOTE — H&P (Signed)
Cath Lab Visit (complete for each Cath Lab visit)  Clinical Evaluation Leading to the Procedure:   ACS: no  Non-ACS:    Anginal Classification: CCS II  Anti-ischemic medical therapy: Minimal Therapy (1 class of medications)  Non-Invasive Test Results: No non-invasive testing performed  Prior CABG: No previous CABG

## 2013-07-15 NOTE — Discharge Instructions (Signed)

## 2013-07-26 ENCOUNTER — Encounter: Payer: Self-pay | Admitting: Family

## 2013-07-26 NOTE — Progress Notes (Signed)
Patient ID: Kevin [name], male   DOB: 1946-01-01, 68 y.o.   MRN: [id number]  Date: 05/18/13 Facility: [facility]  Code Status:  Full Code  Chief Complaint  Patient presents with  . Medical Managment of Chronic Issues    Routine Visit    HPI: Pt is being followed for the medical management of chronic illnesses. Pt and health care team denies issues/concerns at present.       Allergies  Allergen Reactions  . Altace [Ramipril] Shortness Of Breath  . Avapro [Irbesartan]   . Cephalexin   . Clindamycin Hcl   . Codeine   . Fenofibrate Other (See Comments)    "My skin started peeling off"  . Glycotrol [Aminobrain] Other (See Comments)    unknown  . Indomethacin   . Minocycline Hcl   . Morphine And Related   . Other Other (See Comments)    Contrast dye  . Penicillins   . Prednisone   . Septra [Bactrim]   . Shellfish Allergy Other (See Comments)    Unknown   . Sulfa Drugs Cross Reactors   . Tetracyclines & Related      Medication List       This list is accurate as of: 05/18/13 11:59 PM.  Always use your most recent med list.               clopidogrel 75 MG tablet  Commonly known as:  PLAVIX  Take 1 tablet (75 mg total) by mouth daily.     FENOFIBRATE PO  Take by mouth daily.     linagliptin 5 MG Tabs tablet  Commonly known as:  TRADJENTA  Take 5 mg by mouth daily.     metFORMIN 1000 MG tablet  Commonly known as:  GLUCOPHAGE  Take 500 mg by mouth 2 (two) times daily with a meal.     metoprolol succinate 100 MG 24 hr tablet  Commonly known as:  TOPROL-XL  Take 100 mg by mouth daily.     PRAVACHOL 40 MG tablet  Generic drug:  pravastatin  Take 40 mg by mouth daily.     valsartan-hydrochlorothiazide 160-12.5 MG per tablet  Commonly known as:  DIOVAN-HCT  Take 1 tablet by mouth daily.     Vitamin C 500 MG Caps  Take by mouth every morning.         DATA REVIEWED  Laboratory Studies: Reviewed     Past Medical History  Diagnosis Date    . Melanoma 10/11    left foot  . Cellulitis 5/11    left leg  . CAD (coronary artery disease)   . HTN (hypertension)   . Diabetes mellitus   . Obesity   . Seasonal allergies   . S/P colonoscopy 07/03/03     Dr [name]->hyperplastic rectal polyp  . Diverticulosis   . Sleep apnea   . Microscopic colitis 10/09/10    Colonoscopy dr [name]  . Diverticulosis   . S/P endoscopy 10/09/10    lap band intact, duodenal erosions  . Melanoma of foot 06/02/2011   Past Surgical History  Procedure Laterality Date  . Appendectomy    . Shoulder surgery      left  . Knee surgery      left  . Penile prosthesis implant    . Coronary angioplasty  2002/2005/2008     Review of Systems  Constitutional: Negative.   HENT: Negative.   Eyes: Negative.   Respiratory: Negative.   Cardiovascular: Negative.  Gastrointestinal: Positive for abdominal pain.  Musculoskeletal: Positive for joint pain.  Skin: Negative.   Neurological: Negative.   Psychiatric/Behavioral: Negative.      Physical Exam Filed Vitals:   05/18/13 1517  BP: 124/76  Pulse: 68  Temp: 97.6 F (36.4 C)  Resp: 18   Physical Exam  Constitutional: He is oriented to person, place, and time.  HENT:  Head: Normocephalic.  Cardiovascular: Normal rate and regular rhythm.   Pulmonary/Chest: Effort normal and breath sounds normal.  Abdominal: Soft. Bowel sounds are normal.  Musculoskeletal:  Decreased ROM to LUE/tenderness noted  Neurological: He is alert and oriented to person, place, and time.  Psychiatric: He has a normal mood and affect. His behavior is normal. Judgment and thought content normal.    ASSESSMENT/PLAN  Obtain CBC, CMP, Vitamin D, Vitamin B12, Folate on next draw KUB abdomen- constipation/ abdominal pain Xray L upper extremity-tenderness/ pain upon palpation Dulcolax supp x 1 after KUB Senna tablet po qd for unspecified constipation HTN-will continue current treatment plan-well controlled GERD-well  controlled Hyperlipedemia-will continue current tx plan Arthritic Pain-will continue Tylenol and eval with Xray   Follow up:prn

## 2013-07-29 ENCOUNTER — Ambulatory Visit: Payer: Medicare Other | Admitting: Cardiovascular Disease

## 2013-08-17 ENCOUNTER — Telehealth: Payer: Self-pay | Admitting: Cardiovascular Disease

## 2013-08-23 ENCOUNTER — Ambulatory Visit: Payer: Medicare Other | Admitting: Cardiovascular Disease

## 2013-08-25 NOTE — Telephone Encounter (Signed)
Closed encounter °

## 2013-08-29 ENCOUNTER — Other Ambulatory Visit (HOSPITAL_COMMUNITY): Payer: Self-pay | Admitting: Family Medicine

## 2013-08-29 ENCOUNTER — Ambulatory Visit (HOSPITAL_COMMUNITY)
Admission: RE | Admit: 2013-08-29 | Discharge: 2013-08-29 | Disposition: A | Discharge status: Home / Self Care | Payer: Medicare Other | Source: Ambulatory Visit | Attending: Family Medicine | Admitting: Family Medicine

## 2013-08-29 DIAGNOSIS — Z0000 Encounter for general adult medical examination without abnormal findings: Secondary | ICD-10-CM

## 2013-08-29 DIAGNOSIS — I2510 Atherosclerotic heart disease of native coronary artery without angina pectoris: Secondary | ICD-10-CM

## 2013-08-30 ENCOUNTER — Other Ambulatory Visit (HOSPITAL_COMMUNITY): Payer: Self-pay | Admitting: Family Medicine

## 2013-08-30 DIAGNOSIS — Z0000 Encounter for general adult medical examination without abnormal findings: Secondary | ICD-10-CM

## 2013-09-01 ENCOUNTER — Ambulatory Visit (HOSPITAL_COMMUNITY)
Admission: RE | Admit: 2013-09-01 | Discharge: 2013-09-01 | Disposition: A | Discharge status: Home / Self Care | Payer: Medicare Other | Source: Ambulatory Visit | Attending: Family Medicine | Admitting: Family Medicine

## 2013-09-01 DIAGNOSIS — Z0000 Encounter for general adult medical examination without abnormal findings: Secondary | ICD-10-CM

## 2013-09-01 DIAGNOSIS — Z0389 Encounter for observation for other suspected diseases and conditions ruled out: Secondary | ICD-10-CM | POA: Insufficient documentation

## 2013-09-02 ENCOUNTER — Encounter: Payer: Self-pay | Admitting: Cardiovascular Disease

## 2013-09-02 ENCOUNTER — Ambulatory Visit (INDEPENDENT_AMBULATORY_CARE_PROVIDER_SITE_OTHER): Payer: Medicare Other | Admitting: Cardiovascular Disease

## 2013-09-02 VITALS — BP 130/72 | HR 64 | Ht 69.0 in | Wt 286.2 lb

## 2013-09-02 DIAGNOSIS — R600 Localized edema: Secondary | ICD-10-CM

## 2013-09-02 DIAGNOSIS — R609 Edema, unspecified: Secondary | ICD-10-CM

## 2013-09-02 DIAGNOSIS — E785 Hyperlipidemia, unspecified: Secondary | ICD-10-CM

## 2013-09-02 DIAGNOSIS — I10 Essential (primary) hypertension: Secondary | ICD-10-CM

## 2013-09-02 DIAGNOSIS — I2510 Atherosclerotic heart disease of native coronary artery without angina pectoris: Secondary | ICD-10-CM

## 2013-09-02 NOTE — Progress Notes (Signed)
09/02/2013 Kevin [name]   [birth year]  [id number]  Primary Physician Glo Herring., MD Primary Cardiologist: Lorretta Harp MD [name]   HPI:  The patient is a very pleasant 68 year old severely overweight divorced Caucasian male, father of 1 child, who I last saw in the office 6 months ago. 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 He has a history of CAD, status post remote LAD and circumflex stenting in 2001 and 2004 respectively by Drs. [name] and [name]. He stopped smoking and drinking at that time as well. His other problems include hypertension, hyperlipidemia, and non-insulin-requiring diabetes. He also has obstructive sleep apnea, on CPAP. He has had lap-banding in the past with ultimate reaccumulation of his weight. He has had endovenous ablation by Dr. Elisabeth Cara of his left greater saphenous vein, which really did not afford significant clinical improvement. He had a functional study performed December 22, 2008, which was nonischemic, and another one June 24, 2011, because of chest pain, which was low risk. He has been asymptomatic.Dr. [name] recently checked his lipid profile last week as an outpatient. He was admitted to [facility] last month which has been shortness of breath. He underwent cardiac catheterization by Dr. Jeralene Peters Smith's revealing patent LAD and circumflex stents and a left dominant system with an occluded nondominant RCA. Medical therapy was recommended. He experimented with stopping his newly added diabetes medicines one of which apparently was contributing to his symptoms. He feels clinically improved after stopping this medication.    Current Outpatient Prescriptions  Medication Sig Dispense Refill  . Ascorbic Acid (VITAMIN C) 1000 MG tablet Take 1,000 mg by mouth daily.      [name] aspirin 81 MG tablet Take 81 mg by mouth daily.      . B Complex Vitamins (VITAMIN B COMPLEX PO) Take 1 tablet by  mouth daily.      . Canagliflozin (INVOKANA) 100 MG TABS Take 100 mg by mouth daily.      . clopidogrel (PLAVIX) 75 MG tablet Take 1 tablet (75 mg total) by mouth daily.  30 tablet  9  . fluticasone (FLONASE) 50 MCG/ACT nasal spray Place 2 sprays into both nostrils daily.      . metoprolol (TOPROL-XL) 100 MG 24 hr tablet Take 100 mg by mouth daily.        . niacin (NIASPAN) 1000 MG CR tablet Take 1,000 mg by mouth at bedtime.      . nitroGLYCERIN (NITROSTAT) 0.4 MG SL tablet Place 1 tablet (0.4 mg total) under the tongue every 5 (five) minutes as needed for chest pain.  30 tablet  4  . pravastatin (PRAVACHOL) 40 MG tablet Take 40 mg by mouth daily.      . valsartan-hydrochlorothiazide (DIOVAN-HCT) 160-12.5 MG per tablet Take 1 tablet by mouth daily.         No current facility-administered medications for this visit.    Allergies  Allergen Reactions  . Altace [Ramipril] Shortness Of Breath  . Avapro [Irbesartan]   . Cephalexin   . Clindamycin Hcl   . Codeine   . Fenofibrate Other (See Comments)    "My skin started peeling off"  . Glycotrol [Aminobrain] Other (See Comments)    unknown  . Indomethacin   . Minocycline Hcl   . Morphine And Related   . Other Other (See Comments)    Contrast dye  . Penicillins   . Prednisone   . Septra [  Bactrim]   . Shellfish Allergy Other (See Comments)    Unknown   . Sulfa Drugs Cross Reactors   . Tetracyclines & Related   . Tradjenta [Linagliptin]     Shortness of breath    History   Social History  . Marital Status: Divorced    Spouse Name: N/A    Number of Children: 1  . Years of Education: N/A   Occupational History  . surveyor     [employer]   Social History Main Topics  . Smoking status: Former Smoker -- 3.00 packs/day for 60 years    Types: Cigarettes    Quit date: 10/04/1990  . Smokeless tobacco: Never Used  . Alcohol Use: Yes     Comment: 3-4 times/year  . Drug Use: No  . Sexual Activity: Not Currently   Other Topics  Concern  . Not on file   Social History Narrative  . No narrative on file     Review of Systems: General: negative for chills, fever, night sweats or weight changes.  Cardiovascular: negative for chest pain, dyspnea on exertion, edema, orthopnea, palpitations, paroxysmal nocturnal dyspnea or shortness of breath Dermatological: negative for rash Respiratory: negative for cough or wheezing Urologic: negative for hematuria Abdominal: negative for nausea, vomiting, diarrhea, bright red blood per rectum, melena, or hematemesis Neurologic: negative for visual changes, syncope, or dizziness All other systems reviewed and are otherwise negative except as noted above.    Blood pressure 130/72, pulse 64, height 5\' 9"  (1.753 m), weight 286 lb 3.2 oz (129.819 kg).  General appearance: alert and no distress Neck: no adenopathy, no carotid bruit, no JVD, supple, symmetrical, trachea midline and thyroid not enlarged, symmetric, no tenderness/mass/nodules Lungs: clear to auscultation bilaterally Heart: regular rate and rhythm, S1, S2 normal, no murmur, click, rub or gallop Extremities: extremities normal, atraumatic, no cyanosis or edema  EKG not performed today  ASSESSMENT AND PLAN:   Coronary artery disease, hx LAD and LCX stenting in 2001 & 2004, last cath 2009 with patent stents. History of remote stenting of his LAD and circumflex coronary arteries in 2001 at 2004 respectively by Drs. [name] and [name]. He was studied in 2009 by Dr. Clayton Lefort revealing patent stents. The heel left dominant system with some moderate disease in a nondominant RCA. He was recently admitted with chest pain and shortness of breath and underwent cardiac catheterization performed radially by Dr. Pernell Dupre revealing patent stents with an occluded nondominant right coronary artery. After stopping his diabetes medication that was newly started his symptoms have resolved.  Essential hypertension Controlled on  her medications  Hyperlipidemia On statin therapy followed by his PCP  Edema, lower extremity, bil Chronic probably multifactorial related to obesity, diastolic dysfunction, and venous insufficiency. He does have venous stasis changes.      Lorretta Harp MD FACP,FACC,FAHA, [facility] 09/02/2013 8:39 AM

## 2013-09-02 NOTE — Assessment & Plan Note (Signed)
Chronic probably multifactorial related to obesity, diastolic dysfunction, and venous insufficiency. He does have venous stasis changes.

## 2013-09-02 NOTE — Patient Instructions (Signed)
We request that you follow-up in: 6 months with Kevin [name] and in 1 year with Dr Berry  You will receive a reminder letter in the mail two months in advance. If you don't receive a letter, please call our office to schedule the follow-up appointment.   

## 2013-09-02 NOTE — Assessment & Plan Note (Signed)
History of remote stenting of his LAD and circumflex coronary arteries in 2001 at 2004 respectively by Drs. [name] and [name]. He was studied in 2009 by Dr. Clayton Lefort revealing patent stents. The heel left dominant system with some moderate disease in a nondominant RCA. He was recently admitted with chest pain and shortness of breath and underwent cardiac catheterization performed radially by Dr. Pernell Dupre revealing patent stents with an occluded nondominant right coronary artery. After stopping his diabetes medication that was newly started his symptoms have resolved.

## 2013-09-02 NOTE — Assessment & Plan Note (Signed)
Controlled on her medications 

## 2013-09-02 NOTE — Assessment & Plan Note (Signed)
On statin therapy followed by his PCP 

## 2013-09-04 ENCOUNTER — Encounter (HOSPITAL_COMMUNITY): Payer: Self-pay | Admitting: Emergency Medicine

## 2013-09-04 ENCOUNTER — Emergency Department (HOSPITAL_COMMUNITY)
Admission: EM | Admit: 2013-09-04 | Discharge: 2013-09-04 | Disposition: A | Discharge status: Home / Self Care | Payer: Medicare Other | Attending: Emergency Medicine | Admitting: Emergency Medicine

## 2013-09-04 DIAGNOSIS — Z7982 Long term (current) use of aspirin: Secondary | ICD-10-CM | POA: Insufficient documentation

## 2013-09-04 DIAGNOSIS — R110 Nausea: Secondary | ICD-10-CM | POA: Insufficient documentation

## 2013-09-04 DIAGNOSIS — Z9889 Other specified postprocedural states: Secondary | ICD-10-CM | POA: Insufficient documentation

## 2013-09-04 DIAGNOSIS — Z85820 Personal history of malignant melanoma of skin: Secondary | ICD-10-CM | POA: Insufficient documentation

## 2013-09-04 DIAGNOSIS — E785 Hyperlipidemia, unspecified: Secondary | ICD-10-CM | POA: Insufficient documentation

## 2013-09-04 DIAGNOSIS — Z7902 Long term (current) use of antithrombotics/antiplatelets: Secondary | ICD-10-CM | POA: Insufficient documentation

## 2013-09-04 DIAGNOSIS — Z87891 Personal history of nicotine dependence: Secondary | ICD-10-CM | POA: Insufficient documentation

## 2013-09-04 DIAGNOSIS — M7918 Myalgia, other site: Secondary | ICD-10-CM

## 2013-09-04 DIAGNOSIS — Z880 Allergy status to penicillin: Secondary | ICD-10-CM | POA: Insufficient documentation

## 2013-09-04 DIAGNOSIS — Z794 Long term (current) use of insulin: Secondary | ICD-10-CM | POA: Insufficient documentation

## 2013-09-04 DIAGNOSIS — IMO0002 Reserved for concepts with insufficient information to code with codable children: Secondary | ICD-10-CM | POA: Insufficient documentation

## 2013-09-04 DIAGNOSIS — Z8719 Personal history of other diseases of the digestive system: Secondary | ICD-10-CM | POA: Insufficient documentation

## 2013-09-04 DIAGNOSIS — M25519 Pain in unspecified shoulder: Secondary | ICD-10-CM | POA: Insufficient documentation

## 2013-09-04 DIAGNOSIS — I2510 Atherosclerotic heart disease of native coronary artery without angina pectoris: Secondary | ICD-10-CM | POA: Insufficient documentation

## 2013-09-04 DIAGNOSIS — E119 Type 2 diabetes mellitus without complications: Secondary | ICD-10-CM | POA: Insufficient documentation

## 2013-09-04 DIAGNOSIS — Z9861 Coronary angioplasty status: Secondary | ICD-10-CM | POA: Insufficient documentation

## 2013-09-04 DIAGNOSIS — I10 Essential (primary) hypertension: Secondary | ICD-10-CM | POA: Insufficient documentation

## 2013-09-04 DIAGNOSIS — E669 Obesity, unspecified: Secondary | ICD-10-CM | POA: Insufficient documentation

## 2013-09-04 NOTE — ED Notes (Signed)
Pt had new PNA vaccine in R arm on Thurs. Presents today with pain and soreness in Rt shoulder.

## 2013-09-04 NOTE — Discharge Instructions (Signed)
The ultrasound that Dr. Stevie Kern did tonight show no abscess. The muscle may be very sore as a result of the recent vaccine. Apply warm wet compresses to the area and take tylenol as needed for pain. Follow up with your doctor or return here as needed for worsening symptoms.

## 2013-09-04 NOTE — ED Provider Notes (Signed)
CSN: [id number]     Arrival date & time 09/04/13  2019 History   First MD Initiated Contact with Patient 09/04/13 2124     Chief Complaint  Patient presents with  . Shoulder Pain     (Consider location/radiation/quality/duration/timing/severity/associated sxs/prior Treatment) Patient is a 68 y.o. male presenting with shoulder pain. The history is provided by the patient.  Shoulder Pain This is a new problem. The current episode started yesterday. Associated symptoms include nausea. Pertinent negatives include no abdominal pain, anorexia, chest pain, chills, coughing, fever, headaches, joint swelling, myalgias, rash or vomiting. Exacerbated by: movement of arm. He has tried nothing for the symptoms.   BENJAMINE STROUT is a 68 y.o. male who presents to the ED with right shoulder pain that stated yesterday. He states that the got the pneumonia vaccine 3 days ago. Was fine until yesterday when the pain started. He is a diabetic and has had an abscess before in the opposite shoulder and this feels similar. He has taken nothing for pain. PMH of Melanoma left foot, cellulitis left foot, CAD, HTN and diabetes. He denies fever or chills, mild nausea but no vomiting.   Past Medical History  Diagnosis Date  . Melanoma 10/11    left foot  . Cellulitis 5/11    left leg  . CAD (coronary artery disease)   . HTN (hypertension)   . Diabetes mellitus   . Obesity   . Seasonal allergies   . S/P colonoscopy 07/03/03     Dr [name]->hyperplastic rectal polyp  . Diverticulosis   . Sleep apnea   . Microscopic colitis 10/09/10    Colonoscopy dr [name]  . Diverticulosis   . S/P endoscopy 10/09/10    lap band intact, duodenal erosions  . Melanoma of foot 06/02/2011  . Hyperlipidemia    Past Surgical History  Procedure Laterality Date  . Appendectomy    . Shoulder surgery      left  . Knee surgery      left  . Penile prosthesis implant    . Coronary angioplasty  2002/2005/2008   Family History  Problem  Relation Age of Onset  . Liver cancer Brother 60  . Celiac disease Other 10    brother's daughter  . Coronary artery disease Mother   . Diabetes Father    History  Substance Use Topics  . Smoking status: Former Smoker -- 3.00 packs/day for 60 years    Types: Cigarettes    Quit date: 10/04/1990  . Smokeless tobacco: Never Used  . Alcohol Use: Yes     Comment: 3-4 times/year    Review of Systems  Constitutional: Negative for fever and chills.  HENT: Negative.   Eyes: Negative for visual disturbance.  Respiratory: Negative for cough.   Cardiovascular: Negative for chest pain.  Gastrointestinal: Positive for nausea. Negative for vomiting, abdominal pain and anorexia.  Musculoskeletal: Negative for back pain, joint swelling and myalgias.       Right shoulder pain.  Skin: Negative for rash.  Neurological: Negative for syncope and headaches.      Allergies  Altace; Avapro; Cephalexin; Clindamycin hcl; Codeine; Fenofibrate; Glycotrol; Indomethacin; Minocycline hcl; Morphine and related; Other; Penicillins; Prednisone; Septra; Shellfish allergy; Sulfa drugs cross reactors; Tetracyclines & related; and Tradjenta  Home Medications   Prior to Admission medications   Medication Sig Start Date End Date Taking? Authorizing Provider  Ascorbic Acid (VITAMIN C) 1000 MG tablet Take 1,000 mg by mouth daily.    Historical Provider, MD  aspirin 81 MG tablet Take 81 mg by mouth daily.    Historical Provider, MD  B Complex Vitamins (VITAMIN B COMPLEX PO) Take 1 tablet by mouth daily.    Historical Provider, MD  Canagliflozin (INVOKANA) 100 MG TABS Take 100 mg by mouth daily.    Historical Provider, MD  clopidogrel (PLAVIX) 75 MG tablet Take 1 tablet (75 mg total) by mouth daily. 12/29/12   Lorretta Harp, MD  fluticasone (FLONASE) 50 MCG/ACT nasal spray Place 2 sprays into both nostrils daily.    Historical Provider, MD  metoprolol (TOPROL-XL) 100 MG 24 hr tablet Take 100 mg by mouth daily.       Historical Provider, MD  niacin (NIASPAN) 1000 MG CR tablet Take 1,000 mg by mouth at bedtime.    Historical Provider, MD  nitroGLYCERIN (NITROSTAT) 0.4 MG SL tablet Place 1 tablet (0.4 mg total) under the tongue every 5 (five) minutes as needed for chest pain. 07/14/13   Cecilie Kicks, NP  pravastatin (PRAVACHOL) 40 MG tablet Take 40 mg by mouth daily.    Historical Provider, MD  valsartan-hydrochlorothiazide (DIOVAN-HCT) 160-12.5 MG per tablet Take 1 tablet by mouth daily.      Historical Provider, MD   BP 132/75  Pulse 79  Temp(Src) 97.9 F (36.6 C) (Oral)  Resp 20  Ht 5\' 9"  (1.753 m)  Wt 279 lb (126.554 kg)  BMI 41.18 kg/m2  SpO2 98% Physical Exam  Nursing note and vitals reviewed. Constitutional: He is oriented to person, place, and time. He appears well-developed and well-nourished.  HENT:  Head: Atraumatic.  Eyes: EOM are normal.  Neck: Neck supple.  Cardiovascular: Normal rate.   Pulmonary/Chest: Effort normal.  Abdominal: Soft. There is no tenderness.  Musculoskeletal: Normal range of motion.       Right shoulder: He exhibits tenderness. He exhibits normal range of motion, no swelling, no deformity, no laceration, no spasm, normal pulse and normal strength.  There is tenderness to the posterior aspect of the right deltoid at the site of the injection. There is no increased warmth, no erythema, no mass palpated. Radial pulses equal bilateral, grips equal.   Neurological: He is alert and oriented to person, place, and time. No cranial nerve deficit.  Skin: Skin is warm and dry.  Psychiatric: He has a normal mood and affect. His behavior is normal.    ED Course: Dr. Stevie Kern in to examine the patient and ultrasound the area of pain.   Procedures  MDM  68 y.o. male with pain of the right deltoid s/p immunization 3 days ago. Stable for discharge without signs of infection or deltoid disruption. No neurovascular deficits. Discussed with the patient clinical and ultrasound results  and all questioned fully answered. He will return for any problems.     [place], [place] 09/05/13 [phone]

## 2013-09-04 NOTE — ED Provider Notes (Signed)
Medical screening examination/treatment/procedure(s) were conducted as a shared visit with non-physician practitioner(s) and myself.  I personally evaluated the patient during the encounter.  Right upper arm deltoid region soft without swelling erythema or induration; it is mildly tender; limited bedside ultrasound no obvious subcutaneous or muscular fluid collection noted to suggest hematoma or abscess   Babette Relic, MD 09/07/13 2214

## 2013-09-04 NOTE — ED Notes (Signed)
Patient reports received pneumonia vaccine on Thursday. Complaining of pain to right shoulder. States, "I've had an abscess before and this feels the same." No obvious abscess noted.

## 2013-10-11 ENCOUNTER — Other Ambulatory Visit (HOSPITAL_COMMUNITY): Payer: Self-pay | Admitting: Family Medicine

## 2013-10-11 DIAGNOSIS — I10 Essential (primary) hypertension: Secondary | ICD-10-CM

## 2013-10-11 DIAGNOSIS — E1165 Type 2 diabetes mellitus with hyperglycemia: Principal | ICD-10-CM

## 2013-10-11 DIAGNOSIS — IMO0001 Reserved for inherently not codable concepts without codable children: Secondary | ICD-10-CM

## 2013-10-11 DIAGNOSIS — E785 Hyperlipidemia, unspecified: Secondary | ICD-10-CM

## 2013-10-14 ENCOUNTER — Ambulatory Visit (HOSPITAL_COMMUNITY)
Admission: RE | Admit: 2013-10-14 | Discharge: 2013-10-14 | Disposition: A | Discharge status: Home / Self Care | Payer: Medicare Other | Source: Ambulatory Visit | Attending: Family Medicine | Admitting: Family Medicine

## 2013-10-14 DIAGNOSIS — Z9861 Coronary angioplasty status: Secondary | ICD-10-CM | POA: Insufficient documentation

## 2013-10-14 DIAGNOSIS — E119 Type 2 diabetes mellitus without complications: Secondary | ICD-10-CM | POA: Insufficient documentation

## 2013-10-14 DIAGNOSIS — E1165 Type 2 diabetes mellitus with hyperglycemia: Secondary | ICD-10-CM

## 2013-10-14 DIAGNOSIS — I10 Essential (primary) hypertension: Secondary | ICD-10-CM | POA: Insufficient documentation

## 2013-10-14 DIAGNOSIS — IMO0001 Reserved for inherently not codable concepts without codable children: Secondary | ICD-10-CM

## 2013-10-14 DIAGNOSIS — I651 Occlusion and stenosis of basilar artery: Secondary | ICD-10-CM | POA: Insufficient documentation

## 2013-10-14 DIAGNOSIS — Z87891 Personal history of nicotine dependence: Secondary | ICD-10-CM | POA: Insufficient documentation

## 2013-10-14 DIAGNOSIS — I2510 Atherosclerotic heart disease of native coronary artery without angina pectoris: Secondary | ICD-10-CM | POA: Insufficient documentation

## 2013-10-14 DIAGNOSIS — E785 Hyperlipidemia, unspecified: Secondary | ICD-10-CM

## 2013-11-01 ENCOUNTER — Other Ambulatory Visit: Payer: Self-pay

## 2013-11-01 MED ORDER — CLOPIDOGREL BISULFATE 75 MG PO TABS: 75.0000 mg | ORAL_TABLET | Freq: Every day | ORAL | Status: DC

## 2013-11-01 NOTE — Telephone Encounter (Signed)
Rx was sent to pharmacy electronically. 

## 2013-11-15 ENCOUNTER — Telehealth: Payer: Self-pay | Admitting: Cardiovascular Disease

## 2013-11-15 NOTE — Telephone Encounter (Signed)
Forms for [employer] return from [place].  Given to [facility] for Dr. [name] to sign. CR

## 2013-11-18 ENCOUNTER — Ambulatory Visit (HOSPITAL_BASED_OUTPATIENT_CLINIC_OR_DEPARTMENT_OTHER): Payer: Medicare Other | Admitting: Hematology & Oncology

## 2013-11-18 ENCOUNTER — Encounter: Payer: Self-pay | Admitting: Hematology & Oncology

## 2013-11-18 ENCOUNTER — Other Ambulatory Visit (HOSPITAL_BASED_OUTPATIENT_CLINIC_OR_DEPARTMENT_OTHER): Payer: Medicare Other | Admitting: Lab

## 2013-11-18 VITALS — BP 133/63 | HR 65 | Temp 97.6°F | Resp 18 | Ht 69.0 in | Wt 285.0 lb

## 2013-11-18 DIAGNOSIS — C4372 Malignant melanoma of left lower limb, including hip: Secondary | ICD-10-CM

## 2013-11-18 DIAGNOSIS — Z85820 Personal history of malignant melanoma of skin: Secondary | ICD-10-CM

## 2013-11-18 LAB — CBC WITH DIFFERENTIAL (CANCER CENTER ONLY)
BASO#: 0 10*3/uL (ref 0.0–0.2)
BASO%: 0.9 % (ref 0.0–2.0)
EOS ABS: 0.1 10*3/uL (ref 0.0–0.5)
EOS%: 2.3 % (ref 0.0–7.0)
HCT: 42.9 % (ref 38.7–49.9)
HGB: 15 g/dL (ref 13.0–17.1)
LYMPH#: 1.2 10*3/uL (ref 0.9–3.3)
LYMPH%: 27.9 % (ref 14.0–48.0)
MCH: 33.3 pg (ref 28.0–33.4)
MCHC: 35 g/dL (ref 32.0–35.9)
MCV: 95 fL (ref 82–98)
MONO#: 0.5 10*3/uL (ref 0.1–0.9)
MONO%: 10.4 % (ref 0.0–13.0)
NEUT#: 2.6 10*3/uL (ref 1.5–6.5)
NEUT%: 58.5 % (ref 40.0–80.0)
PLATELETS: 164 10*3/uL (ref 145–400)
RBC: 4.51 10*6/uL (ref 4.20–5.70)
RDW: 13.4 % (ref 11.1–15.7)
WBC: 4.4 10*3/uL (ref 4.0–10.0)

## 2013-11-18 LAB — COMPREHENSIVE METABOLIC PANEL
ALT: 16 U/L (ref 0–53)
AST: 13 U/L (ref 0–37)
Albumin: 4.1 g/dL (ref 3.5–5.2)
Alkaline Phosphatase: 33 U/L — ABNORMAL LOW (ref 39–117)
BILIRUBIN TOTAL: 0.6 mg/dL (ref 0.2–1.2)
BUN: 19 mg/dL (ref 6–23)
CO2: 29 meq/L (ref 19–32)
CREATININE: 1.41 mg/dL — AB (ref 0.50–1.35)
Calcium: 9.8 mg/dL (ref 8.4–10.5)
Chloride: 102 mEq/L (ref 96–112)
GLUCOSE: 136 mg/dL — AB (ref 70–99)
Potassium: 4.1 mEq/L (ref 3.5–5.3)
SODIUM: 141 meq/L (ref 135–145)
Total Protein: 6.5 g/dL (ref 6.0–8.3)

## 2013-11-18 LAB — LACTATE DEHYDROGENASE: LDH: 150 U/L (ref 94–250)

## 2013-11-19 NOTE — Progress Notes (Signed)
Hematology and Oncology Follow Up Visit  Kevin [name] [id number] 02-Sep-1945 68 y.o. 11/19/2013   Principle Diagnosis:  Stage IA (T1a N0 M0) melanoma of the left foot.  Current Therapy:    Observation     Interim History:  Kevin [name] is back for followup. We see him once a year. He's been doing okay. We last saw him, he has undergone some radiological studies. He had a renal ultrasound done in December. This was unremarkable. He had an ultrasound of his abdomen in April of this year. This did not show any abdominal aortic aneurysm. He then had carotid Dopplers. These are done in June of this year. He had minimal plaque. No hemodynamically significant stenosis was noted.  He's retired. He's been having. He and his family will be going to [place] in a couple weeks. Prevacid no cough with swelling of his left leg. He's had no abdominal pain. He's had no change in bowel or bladder habits.  Medications: Current outpatient prescriptions:Ascorbic Acid (VITAMIN C) 1000 MG tablet, Take 1,000 mg by mouth daily., Disp: , Rfl: ;  aspirin 81 MG tablet, Take 81 mg by mouth daily., Disp: , Rfl: ;  B Complex Vitamins (VITAMIN B COMPLEX PO), Take 1 tablet by mouth daily., Disp: , Rfl: ;  clopidogrel (PLAVIX) 75 MG tablet, Take 1 tablet (75 mg total) by mouth daily., Disp: 30 tablet, Rfl: 10 fluticasone (FLONASE) 50 MCG/ACT nasal spray, Place 2 sprays into both nostrils daily., Disp: , Rfl: ;  linagliptin (TRADJENTA) 5 MG TABS tablet, Take 5 mg by mouth daily., Disp: , Rfl: ;  metoprolol (TOPROL-XL) 100 MG 24 hr tablet, Take 100 mg by mouth daily.  , Disp: , Rfl: ;  niacin (NIASPAN) 1000 MG CR tablet, Take 1,000 mg by mouth at bedtime., Disp: , Rfl:  nitroGLYCERIN (NITROSTAT) 0.4 MG SL tablet, Place 1 tablet (0.4 mg total) under the tongue every 5 (five) minutes as needed for chest pain., Disp: 30 tablet, Rfl: 4;  pravastatin (PRAVACHOL) 40 MG tablet, Take 40 mg by mouth daily., Disp: , Rfl: ;  sitaGLIPtin  (JANUVIA) 100 MG tablet, Take 100 mg by mouth daily., Disp: , Rfl: ;  valsartan-hydrochlorothiazide (DIOVAN-HCT) 160-12.5 MG per tablet, Take 1 tablet by mouth daily.  , Disp: , Rfl:   Allergies:  Allergies  Allergen Reactions  . Altace [Ramipril] Shortness Of Breath  . Avapro [Irbesartan]   . Cephalexin   . Clindamycin Hcl   . Codeine   . Fenofibrate Other (See Comments)    "My skin started peeling off"  . Glycotrol [Aminobrain] Other (See Comments)    unknown  . Indomethacin   . Minocycline Hcl   . Morphine And Related   . Other Other (See Comments)    Contrast dye  . Penicillins   . Prednisone   . Septra [Bactrim]   . Shellfish Allergy Other (See Comments)    Unknown   . Sulfa Drugs Cross Reactors   . Tetracyclines & Related   . Tradjenta [Linagliptin]     Shortness of breath    Past Medical History, Surgical history, Social history, and Family History were reviewed and updated.  Review of Systems: As above  Physical Exam:  height is 5\' 9"  (1.753 m) and weight is 285 lb (129.275 kg). His oral temperature is 97.6 F (36.4 C). His blood pressure is 133/63 and his pulse is 65. His respiration is 18.   Somewhat obese white. Head and neck exam shows no ocular  or oral lesions. There are no palpable cervical or supraclavicular lymph nodes. Lungs are clear bilaterally. Cardiac exam regular rate rhythm with no murmurs rubs or bruits. Abdomen is obese but soft. Has good bowel sounds. There is no fluid wave. There is no palpable liver or spleen. He has no adenopathy in the inguinal region. Extremities shows well-healed wide local excision scar on his left foot. Some slight swelling is noted about the lower left leg and ankle. He has good range of motion of his joints. Skin exam shows no suspicious lesions. Neurological exam is nonfocal.  Lab Results  Component Value Date   WBC 4.4 11/18/2013   HGB 15.0 11/18/2013   HCT 42.9 11/18/2013   MCV 95 11/18/2013   PLT 164 11/18/2013      Chemistry      Component Value Date/Time   NA 141 11/18/2013 0940   K 4.1 11/18/2013 0940   CL 102 11/18/2013 0940   CO2 29 11/18/2013 0940   BUN 19 11/18/2013 0940   CREATININE 1.41* 11/18/2013 0940   CREATININE 1.31 07/14/2013 1128      Component Value Date/Time   CALCIUM 9.8 11/18/2013 0940   ALKPHOS 33* 11/18/2013 0940   AST 13 11/18/2013 0940   ALT 16 11/18/2013 0940   BILITOT 0.6 11/18/2013 0940         Impression and Plan: Kevin [name] is a 68 year old gentleman with a stage Ia melanoma of the left foot. He underwent resection back in October of 2011. He's doing well. I don't see any evidence of recurrence.  There is no need for any x-ray studies.  We will get him back in one year. Volanda Napoleon, MD 7/18/20157:53 AM

## 2013-11-21 ENCOUNTER — Telehealth: Payer: Self-pay | Admitting: Hematology & Oncology

## 2013-11-21 NOTE — Telephone Encounter (Signed)
Mailed 11-2014 schedule

## 2013-11-21 NOTE — Telephone Encounter (Signed)
lmom for patient to call back.  I am unsure of who has placed this patient on disability.

## 2013-11-25 NOTE — Telephone Encounter (Signed)
Lmom, letter mailed asking patient to contact me.

## 2013-11-28 ENCOUNTER — Telehealth: Payer: Self-pay | Admitting: Cardiovascular Disease

## 2013-11-28 NOTE — Telephone Encounter (Signed)
Returning your call from last week. This call was from [place].

## 2013-11-29 NOTE — Telephone Encounter (Signed)
Per Mariann Laster this note should be handled by triage.

## 2013-11-29 NOTE — Telephone Encounter (Signed)
This note should go to the triage pool

## 2013-11-29 NOTE — Telephone Encounter (Signed)
Spoke with pt, the forms are for a different insurance company for when he was out having his cath 4-13 and 4-14. Explained will look for the forms but maybe next week when Curt Bears gets back before forms are completed. Patient voiced understanding

## 2013-12-02 ENCOUNTER — Ambulatory Visit (HOSPITAL_COMMUNITY)
Admission: RE | Admit: 2013-12-02 | Discharge: 2013-12-02 | Disposition: A | Discharge status: Home / Self Care | Payer: Medicare Other | Source: Ambulatory Visit | Attending: Family Medicine | Admitting: Family Medicine

## 2013-12-02 ENCOUNTER — Other Ambulatory Visit (HOSPITAL_COMMUNITY): Payer: Self-pay | Admitting: Family Medicine

## 2013-12-02 DIAGNOSIS — M79609 Pain in unspecified limb: Secondary | ICD-10-CM | POA: Insufficient documentation

## 2013-12-02 DIAGNOSIS — M79662 Pain in left lower leg: Secondary | ICD-10-CM

## 2013-12-02 DIAGNOSIS — M7989 Other specified soft tissue disorders: Secondary | ICD-10-CM

## 2013-12-02 NOTE — Telephone Encounter (Signed)
Spoke with pt, insurance form completed and original mailed to the patient

## 2013-12-05 ENCOUNTER — Other Ambulatory Visit: Payer: Self-pay | Admitting: *Deleted

## 2013-12-05 MED ORDER — NIACIN ER (ANTIHYPERLIPIDEMIC) 1000 MG PO TBCR: 1000.0000 mg | EXTENDED_RELEASE_TABLET | Freq: Every day | ORAL | Status: DC

## 2013-12-05 NOTE — Telephone Encounter (Signed)
Patient spoke with Fredia Beets and forms were filled out.  Completed forms were mailed to patient by Hilda Blades and I gave a copy to [place] in [place]

## 2013-12-12 ENCOUNTER — Telehealth: Payer: Self-pay | Admitting: Cardiovascular Disease

## 2013-12-12 NOTE — Telephone Encounter (Signed)
Closed encounter °

## 2013-12-19 ENCOUNTER — Ambulatory Visit (HOSPITAL_BASED_OUTPATIENT_CLINIC_OR_DEPARTMENT_OTHER): Payer: Medicare Other | Attending: Family Medicine

## 2013-12-19 NOTE — Sleep Study (Unsigned)
12/19/2013 (2000): Patient arrived at [facility] with concerns about having a NPSG study during the night. Patient was concerned because he has been on cpap for some years now and felt it would be in his best interest to speak with his doctor before going through with the study without wearing his cpap device. Tech explained to patient that even with a split night study there is still a possibility of not meeting criteria. Therefore, patient requested to reschedule after he has had an opportunity to speak with his physician.  Carolin Coy, RPSGT,RRT, RST

## 2014-01-16 ENCOUNTER — Other Ambulatory Visit (HOSPITAL_COMMUNITY): Payer: Self-pay | Admitting: Family Medicine

## 2014-01-16 ENCOUNTER — Ambulatory Visit (HOSPITAL_COMMUNITY)
Admission: RE | Admit: 2014-01-16 | Discharge: 2014-01-16 | Disposition: A | Discharge status: Home / Self Care | Payer: Medicare Other | Source: Ambulatory Visit | Attending: Family Medicine | Admitting: Family Medicine

## 2014-01-16 DIAGNOSIS — R079 Chest pain, unspecified: Secondary | ICD-10-CM

## 2014-01-18 ENCOUNTER — Encounter: Payer: Self-pay | Admitting: *Deleted

## 2014-01-20 ENCOUNTER — Encounter: Payer: Self-pay | Admitting: Cardiology

## 2014-01-20 ENCOUNTER — Ambulatory Visit (INDEPENDENT_AMBULATORY_CARE_PROVIDER_SITE_OTHER): Payer: Medicare Other | Admitting: Cardiology

## 2014-01-20 VITALS — BP 120/60 | HR 74 | Ht 69.0 in | Wt 294.2 lb

## 2014-01-20 DIAGNOSIS — I10 Essential (primary) hypertension: Secondary | ICD-10-CM

## 2014-01-20 DIAGNOSIS — I2510 Atherosclerotic heart disease of native coronary artery without angina pectoris: Secondary | ICD-10-CM

## 2014-01-20 DIAGNOSIS — Z9989 Dependence on other enabling machines and devices: Secondary | ICD-10-CM

## 2014-01-20 DIAGNOSIS — E785 Hyperlipidemia, unspecified: Secondary | ICD-10-CM

## 2014-01-20 DIAGNOSIS — G4733 Obstructive sleep apnea (adult) (pediatric): Secondary | ICD-10-CM

## 2014-01-20 HISTORY — DX: Obstructive sleep apnea (adult) (pediatric): G47.33

## 2014-01-20 NOTE — Patient Instructions (Signed)
Continue Same Medications    Your physician wants you to follow-up in: 6 months with Dr.Berry. You will receive a reminder letter in the mail two months in advance. If you don't receive a letter, please call our office to schedule the follow-up appointment.

## 2014-01-20 NOTE — Assessment & Plan Note (Signed)
Lipid Panel     Component Value Date/Time   CHOL 110 10/09/2010 0512   TRIG 255* 10/09/2010 0512   HDL 30* 10/09/2010 0512   CHOLHDL 3.7 10/09/2010 0512   VLDL 51* 10/09/2010 0512   LDLCALC  Value: 29        Total Cholesterol/HDL:CHD Risk Coronary Heart Disease Risk Table                     Men   Women  1/2 Average Risk   3.4   3.3  Average Risk       5.0   4.4  2 X Average Risk   9.6   7.1  3 X Average Risk  23.4   11.0        Use the calculated Patient Ratio above and the CHD Risk Table to determine the patient's CHD Risk.        ATP III CLASSIFICATION (LDL):  <100     mg/dL   Optimal  100-129  mg/dL   Near or Above                    Optimal  130-159  mg/dL   Borderline  160-189  mg/dL   High  >190     mg/dL   Very High 10/09/2010 0512    On Pravachol.

## 2014-01-20 NOTE — Assessment & Plan Note (Signed)
controlled 

## 2014-01-20 NOTE — Assessment & Plan Note (Signed)
Continues to use.

## 2014-01-20 NOTE — Assessment & Plan Note (Signed)
March 2015 was last cath for SOB,  cardiac catheterization by Dr. Pernell Dupre revealed patent LAD and circumflex stents and a left dominant system with an occluded nondominant RCA. Medical therapy was recommended.  No pain today and normal EKG

## 2014-01-20 NOTE — Progress Notes (Signed)
01/20/2014   PCP: Glo Herring., MD   Chief Complaint  Patient presents with  . Follow-up    pt denies chest pain and sob. pt states that he does have swelling in his feet.    Primary Cardiologist:Dr. Adora Fridge   HPI:  68 year old severely overweight divorced Caucasian male, father of 1 child, who I last saw in the office 6 months ago. 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 He has a history of CAD, status post remote LAD and circumflex stenting in 2001 and 2004 respectively by Drs. [name] and [name]. He stopped smoking and drinking at that time as well. His other problems include hypertension, hyperlipidemia, and non-insulin-requiring diabetes. He also has obstructive sleep apnea, on CPAP. He has had lap-banding in the past with ultimate reaccumulation of his weight. He has had endovenous ablation by Dr. Elisabeth Cara of his left greater saphenous vein, which really did not afford significant clinical improvement. He had a functional study performed December 22, 2008, which was nonischemic, and another one June 24, 2011, because of chest pain, which was low risk. He has been asymptomatic.Dr. [name] recently checked his lipid profile in June.  He was admitted to [facility] in March 2015 for shortness of breath. He underwent cardiac catheterization by Dr. Pernell Dupre revealing patent LAD and circumflex stents and a left dominant system with an occluded nondominant RCA. Medical therapy was recommended. He experimented with stopping his newly added diabetes medicines one of which apparently was contributing to his symptoms. He feels clinically improved after stopping this medication.  No complaints today no chest pain, no SOB.  His melanoma  has been resolved for 5 years, followed by Dr. Marin Olp.  He is exercising some, difficult because he has bad knees. He is watching his diet and his diabetes is well controlled when he watches his  diet.   Allergies  Allergen Reactions  . Altace [Ramipril] Shortness Of Breath  . Avapro [Irbesartan]   . Cephalexin   . Clindamycin Hcl   . Codeine   . Fenofibrate Other (See Comments)    "My skin started peeling off"  . Glycotrol [Aminobrain] Other (See Comments)    unknown  . Indomethacin   . Minocycline Hcl   . Morphine And Related   . Other Other (See Comments)    Contrast dye  . Penicillins   . Prednisone   . Septra [Bactrim]   . Shellfish Allergy Other (See Comments)    Unknown   . Sulfa Drugs Cross Reactors   . Tetracyclines & Related   . Tradjenta [Linagliptin]     Shortness of breath    Current Outpatient Prescriptions  Medication Sig Dispense Refill  . Ascorbic Acid (VITAMIN C) 1000 MG tablet Take 1,000 mg by mouth daily.      [name] aspirin 81 MG tablet Take 81 mg by mouth daily.      . B Complex Vitamins (VITAMIN B COMPLEX PO) Take 1 tablet by mouth daily.      . clopidogrel (PLAVIX) 75 MG tablet Take 1 tablet (75 mg total) by mouth daily.  30 tablet  10  . fluticasone (FLONASE) 50 MCG/ACT nasal spray Place 2 sprays into both nostrils daily.      [name] linagliptin (TRADJENTA) 5 MG TABS tablet Take 5 mg by mouth daily.      . metoprolol (TOPROL-XL) 100 MG 24 hr tablet Take 100 mg  by mouth daily.        . niacin (NIASPAN) 1000 MG CR tablet Take 1 tablet (1,000 mg total) by mouth at bedtime.  30 tablet  12  . nitroGLYCERIN (NITROSTAT) 0.4 MG SL tablet Place 1 tablet (0.4 mg total) under the tongue every 5 (five) minutes as needed for chest pain.  30 tablet  4  . pravastatin (PRAVACHOL) 40 MG tablet Take 40 mg by mouth daily.      . sitaGLIPtin (JANUVIA) 100 MG tablet Take 100 mg by mouth daily.      . valsartan-hydrochlorothiazide (DIOVAN-HCT) 160-12.5 MG per tablet Take 1 tablet by mouth daily.         No current facility-administered medications for this visit.    Past Medical History  Diagnosis Date  . Melanoma 10/11    left foot  . Cellulitis 5/11    left  leg  . CAD (coronary artery disease)   . HTN (hypertension)   . Diabetes mellitus   . Obesity   . Seasonal allergies   . S/P colonoscopy 07/03/03     Dr [name]->hyperplastic rectal polyp  . Diverticulosis   . Sleep apnea   . Microscopic colitis 10/09/10    Colonoscopy dr [name]  . Diverticulosis   . S/P endoscopy 10/09/10    lap band intact, duodenal erosions  . Melanoma of foot 06/02/2011  . Hyperlipidemia   . OSA on CPAP 01/20/2014    Past Surgical History  Procedure Laterality Date  . Appendectomy    . Shoulder surgery      left  . Knee surgery      left  . Penile prosthesis implant    . Coronary angioplasty  2002/2005/2008  . Coronary angioplasty with stent placement  06/11/2007    PTCA & stenting distal CX, PTCA & balloon angioplasty in-stent restenotic mid CX coronary artery stent  . Nm myocar perf wall motion  06/24/2011    small mild fixed anteroseptal & anteroapical defects.    RCV:ELFYBOF:BP colds or fevers, increase of weight Skin:no rashes or ulcers HEENT:no blurred vision, no congestion CV:see HPI PUL:see HPI GI:no diarrhea constipation or melena, no indigestion GU:no hematuria, no dysuria MS:+ knee pain, no claudication Neuro:no syncope, no lightheadedness Endo:+ diabetes- controlled, no thyroid disease Wears his CPap every night, could not do without.   Wt Readings from Last 3 Encounters:  01/20/14 294 lb 3.2 oz (133.448 kg)  11/18/13 285 lb (129.275 kg)  09/04/13 279 lb (126.554 kg)    PHYSICAL EXAM BP 120/60  Pulse 74  Ht 5\' 9"  (1.753 m)  Wt 294 lb 3.2 oz (133.448 kg)  BMI 43.43 kg/m2 General:Pleasant affect, NAD Skin:Warm and dry, brisk capillary refill HEENT:normocephalic, sclera clear, mucus membranes moist Neck:supple, no JVD, no bruits  Heart:S1S2 RRR without murmur, gallup, rub or click Lungs:clear without rales, rhonchi, or wheezes ZWC:HENI, non tender, + BS, do not palpate liver spleen or masses Ext:no lower ext edema, 2+ pedal pulses, 2+  radial pulses Neuro:alert and oriented, MAE, follows commands, + facial symmetry  EKG:SR  HR 71, normal EKG, no changes from previous  ASSESSMENT AND PLAN Coronary artery disease, hx LAD and LCX stenting in 2001 & 2004, last cath 2009 with patent stents. March 2015 was last cath for SOB,  cardiac catheterization by Dr. Pernell Dupre revealed patent LAD and circumflex stents and a left dominant system with an occluded nondominant RCA. Medical therapy was recommended.  No pain today and normal EKG  Essential hypertension controlled  Hyperlipidemia Lipid Panel     Component Value Date/Time   CHOL 110 10/09/2010 0512   TRIG 255* 10/09/2010 0512   HDL 30* 10/09/2010 0512   CHOLHDL 3.7 10/09/2010 0512   VLDL 51* 10/09/2010 0512   LDLCALC  Value: 29        Total Cholesterol/HDL:CHD Risk Coronary Heart Disease Risk Table                     Men   Women  1/2 Average Risk   3.4   3.3  Average Risk       5.0   4.4  2 X Average Risk   9.6   7.1  3 X Average Risk  23.4   11.0        Use the calculated Patient Ratio above and the CHD Risk Table to determine the patient's CHD Risk.        ATP III CLASSIFICATION (LDL):  <100     mg/dL   Optimal  100-129  mg/dL   Near or Above                    Optimal  130-159  mg/dL   Borderline  160-189  mg/dL   High  >190     mg/dL   Very High 10/09/2010 0512    On Pravachol.  OSA on CPAP Continues to use    Follow up with Dr. Adora Fridge in 6 months, call if problems prior to that time.

## 2014-04-13 ENCOUNTER — Encounter (HOSPITAL_COMMUNITY): Payer: Self-pay | Admitting: Interventional Cardiology

## 2014-06-12 ENCOUNTER — Telehealth: Payer: Self-pay | Admitting: *Deleted

## 2014-06-12 NOTE — Telephone Encounter (Signed)
Pt presented walk-in w/ preoperative clearance paperwork from [facility] for pending Right TKA.  Recommendation is Plavix d/c'ed 1 week prior to surgery (w/ Lovenox bridge if needed)  Pt noted he would like to discuss at upcoming appt.  Paperwork in Dr. Kennon Holter inbox.

## 2014-07-21 ENCOUNTER — Encounter: Payer: Self-pay | Admitting: Cardiovascular Disease

## 2014-07-21 ENCOUNTER — Ambulatory Visit (INDEPENDENT_AMBULATORY_CARE_PROVIDER_SITE_OTHER): Payer: Medicare Other | Admitting: Cardiovascular Disease

## 2014-07-21 VITALS — BP 128/60 | HR 87 | Ht 69.5 in | Wt 282.6 lb

## 2014-07-21 DIAGNOSIS — I2510 Atherosclerotic heart disease of native coronary artery without angina pectoris: Secondary | ICD-10-CM

## 2014-07-21 DIAGNOSIS — E785 Hyperlipidemia, unspecified: Secondary | ICD-10-CM

## 2014-07-21 DIAGNOSIS — G4733 Obstructive sleep apnea (adult) (pediatric): Secondary | ICD-10-CM

## 2014-07-21 DIAGNOSIS — Z9989 Dependence on other enabling machines and devices: Principal | ICD-10-CM

## 2014-07-21 DIAGNOSIS — I2583 Coronary atherosclerosis due to lipid rich plaque: Secondary | ICD-10-CM

## 2014-07-21 DIAGNOSIS — I10 Essential (primary) hypertension: Secondary | ICD-10-CM

## 2014-07-21 NOTE — Assessment & Plan Note (Signed)
History of hyperlipidemia on pravastatin 40 mg a day followed by his PCP 

## 2014-07-21 NOTE — Assessment & Plan Note (Signed)
History of coronary artery disease status post remote stenting of his LAD and circumflex complex coronary arteries in 2001 at 2004 respectively by Drs. [name] and [name]. He had a heart Physician performing Dr. Jeralene Peters Smith's March of last year revealing patent LAD and circumflex stents with an occluded nondominant RCA. Medical therapy was recommended. He has had no recurrent symptoms. He is scheduled to have a total knee replacement by Dr. Hillery Aldo which he'll be cleared for at low risk given his recent heart cath and a systematic status. He will be able to stop his Plavix prior to the procedure and restart when clinically acceptable with his surgeon.

## 2014-07-21 NOTE — Patient Instructions (Signed)
Your physician wants you to follow-up in: 1 year with Dr [name]. You will receive a reminder letter in the mail two months in advance. If you don't receive a letter, please call our office to schedule the follow-up appointment.  We have referred you to Dr Claiborne Billings for an appointment in the sleep clinic.    Dr [name] has cleared you for your knee surgery and I have faxed the form to [facility].

## 2014-07-21 NOTE — Assessment & Plan Note (Signed)
He is currently wearing C Pap with appropriate settings but says that he wakes up on occasion and was wondering whether he has "central sleep apnea". I'm going to refer him to Dr. Ellouise Newer for further evaluation

## 2014-07-21 NOTE — Assessment & Plan Note (Signed)
History of hypertension blood pressure measured today at 128/60. He is on metoprolol , valsartan and hydrochlorothiazide. Continue current meds at current dosing

## 2014-07-21 NOTE — Progress Notes (Signed)
07/21/2014 Kevin [name]   11/08/1945  [id number]  Primary Physician Glo Herring., MD Primary Cardiologist: Lorretta Harp MD Kevin [name]   HPI:   The patient is a very pleasant 69 year old severely overweight divorced Caucasian male, father of 1 child, who I last saw in the office 10 months ago. 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 He has a history of CAD, status post remote LAD and circumflex stenting in 2001 and 2004 respectively by Drs. [name] and [name]. He stopped smoking and drinking at that time as well. His other problems include hypertension, hyperlipidemia, and non-insulin-requiring diabetes. He also has obstructive sleep apnea, on CPAP. He has had lap-banding in the past with ultimate reaccumulation of his weight. He has had endovenous ablation by Dr. Elisabeth Cara of his left greater saphenous vein, which really did not afford significant clinical improvement. He had a functional study performed December 22, 2008, which was nonischemic, and another one June 24, 2011, because of chest pain, which was low risk. He has been asymptomatic.Dr. [name] recently checked his lipid profile last week as an outpatient. He was admitted to [facility] last month which has been shortness of breath. He underwent cardiac catheterization by Dr. Jeralene Peters Smith's revealing patent LAD and circumflex stents and a left dominant system with an occluded nondominant RCA. Medical therapy was recommended. He experimented with stopping his newly added diabetes medicines one of which apparently was contributing to his symptoms. He feels clinically improved after stopping this medication. Since seeing him back in May last year he has remained clinically asymptomatic. Apparently he is scheduled to have elective total knee replacement by Dr. Theda Sers for which we will clear him at low risk given his recent heart catheterization.   Current  Outpatient Prescriptions  Medication Sig Dispense Refill  . Ascorbic Acid (VITAMIN C) 1000 MG tablet Take 1,000 mg by mouth daily.    [name] aspirin 81 MG tablet Take 81 mg by mouth daily.    . B Complex Vitamins (VITAMIN B COMPLEX PO) Take 1 tablet by mouth daily.    . clopidogrel (PLAVIX) 75 MG tablet Take 1 tablet (75 mg total) by mouth daily. 30 tablet 10  . fluticasone (FLONASE) 50 MCG/ACT nasal spray Place 2 sprays into both nostrils daily.    . furosemide (LASIX) 40 MG tablet Take 40 mg by mouth daily.    . metFORMIN (GLUCOPHAGE) 1000 MG tablet Take 500 mg by mouth 2 (two) times daily with a meal.    . metoprolol (TOPROL-XL) 100 MG 24 hr tablet Take 100 mg by mouth daily.      . niacin (NIASPAN) 1000 MG CR tablet Take 1 tablet (1,000 mg total) by mouth at bedtime. 30 tablet 12  . nitroGLYCERIN (NITROSTAT) 0.4 MG SL tablet Place 1 tablet (0.4 mg total) under the tongue every 5 (five) minutes as needed for chest pain. 30 tablet 4  . pravastatin (PRAVACHOL) 40 MG tablet Take 40 mg by mouth daily.    . valsartan-hydrochlorothiazide (DIOVAN-HCT) 160-12.5 MG per tablet Take 1 tablet by mouth daily.       No current facility-administered medications for this visit.    Allergies  Allergen Reactions  . Altace [Ramipril] Shortness Of Breath  . Avapro [Irbesartan]   . Cephalexin   . Clindamycin Hcl   . Codeine   . Fenofibrate Other (See Comments)    "My skin started peeling off"  .  Glycotrol [Aminobrain] Other (See Comments)    unknown  . Indomethacin   . Minocycline Hcl   . Morphine And Related   . Other Other (See Comments)    Contrast dye  . Penicillins   . Prednisone   . Septra [Bactrim]   . Shellfish Allergy Other (See Comments)    Unknown   . Sulfa Drugs Cross Reactors   . Tetracyclines & Related   . Tradjenta [Linagliptin]     Shortness of breath    History   Social History  . Marital Status: Divorced    Spouse Name: N/A  . Number of Children: 1  . Years of  Education: N/A   Occupational History  . surveyor     Kevin [name]   Social History Main Topics  . Smoking status: Former Smoker -- 3.00 packs/day for 30 years    Types: Cigarettes    Start date: 11/18/1964    Quit date: 10/04/1994  . Smokeless tobacco: Never Used     Comment: quit smoking 19 years ago  . Alcohol Use: Yes     Comment: 3-4 times/year  . Drug Use: No  . Sexual Activity: Not Currently   Other Topics Concern  . Not on file   Social History Narrative     Review of Systems: General: negative for chills, fever, night sweats or weight changes.  Cardiovascular: negative for chest pain, dyspnea on exertion, edema, orthopnea, palpitations, paroxysmal nocturnal dyspnea or shortness of breath Dermatological: negative for rash Respiratory: negative for cough or wheezing Urologic: negative for hematuria Abdominal: negative for nausea, vomiting, diarrhea, bright red blood per rectum, melena, or hematemesis Neurologic: negative for visual changes, syncope, or dizziness All other systems reviewed and are otherwise negative except as noted above.    Blood pressure 128/60, pulse 87, height 5' 9.5" (1.765 m), weight 282 lb 9.6 oz (128.187 kg).  General appearance: alert and no distress Neck: no adenopathy, no carotid bruit, no JVD, supple, symmetrical, trachea midline and thyroid not enlarged, symmetric, no tenderness/mass/nodules Lungs: clear to auscultation bilaterally Heart: regular rate and rhythm, S1, S2 normal, no murmur, click, rub or gallop Extremities: extremities normal, atraumatic, no cyanosis or edema  EKG normal sinus rhythm 87 without ST or T-wave changes. I personally reviewed this EKG  ASSESSMENT AND PLAN:   OSA on CPAP He is currently wearing C Pap with appropriate settings but says that he wakes up on occasion and was wondering whether he has "central sleep apnea". I'm going to refer him to Dr. Ellouise Newer for further evaluation   Hyperlipidemia History of  hyperlipidemia on pravastatin 40 mg a day followed by his PCP   Essential hypertension History of hypertension blood pressure measured today at 128/60. He is on metoprolol , valsartan and hydrochlorothiazide. Continue current meds at current dosing   Coronary artery disease, hx LAD and LCX stenting in 2001 & 2004, last cath 2009 with patent stents. History of coronary artery disease status post remote stenting of his LAD and circumflex complex coronary arteries in 2001 at 2004 respectively by Drs. [name] and [name]. He had a heart Physician performing Dr. Jeralene Peters Smith's March of last year revealing patent LAD and circumflex stents with an occluded nondominant RCA. Medical therapy was recommended. He has had no recurrent symptoms. He is scheduled to have a total knee replacement by Dr. Hillery Aldo which he'll be cleared for at low risk given his recent heart cath and a systematic status. He will be able to  stop his Plavix prior to the procedure and restart when clinically acceptable with his surgeon.       Lorretta Harp MD FACP,FACC,FAHA, [facility] 07/21/2014 9:49 AM

## 2014-07-29 ENCOUNTER — Other Ambulatory Visit: Payer: Self-pay | Admitting: Cardiology

## 2014-07-31 NOTE — Telephone Encounter (Signed)
Rx(s) sent to pharmacy electronically.  

## 2014-08-17 ENCOUNTER — Ambulatory Visit: Payer: Self-pay | Admitting: Cardiovascular Disease

## 2014-09-27 ENCOUNTER — Other Ambulatory Visit: Payer: Self-pay | Admitting: Cardiovascular Disease

## 2014-09-27 NOTE — Telephone Encounter (Signed)
Rx has been sent to the pharmacy electronically. ° °

## 2014-11-17 ENCOUNTER — Other Ambulatory Visit (HOSPITAL_BASED_OUTPATIENT_CLINIC_OR_DEPARTMENT_OTHER): Payer: Medicare Other

## 2014-11-17 ENCOUNTER — Ambulatory Visit (HOSPITAL_BASED_OUTPATIENT_CLINIC_OR_DEPARTMENT_OTHER): Payer: Self-pay | Admitting: Hematology & Oncology

## 2014-11-17 ENCOUNTER — Encounter: Payer: Self-pay | Admitting: Hematology & Oncology

## 2014-11-17 VITALS — BP 145/74 | HR 77 | Temp 98.2°F | Resp 18 | Wt 270.0 lb

## 2014-11-17 DIAGNOSIS — C4372 Malignant melanoma of left lower limb, including hip: Secondary | ICD-10-CM

## 2014-11-17 LAB — CBC WITH DIFFERENTIAL (CANCER CENTER ONLY)
BASO#: 0 10*3/uL (ref 0.0–0.2)
BASO%: 0.6 % (ref 0.0–2.0)
EOS ABS: 0.2 10*3/uL (ref 0.0–0.5)
EOS%: 3.6 % (ref 0.0–7.0)
HEMATOCRIT: 39.5 % (ref 38.7–49.9)
HGB: 14 g/dL (ref 13.0–17.1)
LYMPH#: 1.6 10*3/uL (ref 0.9–3.3)
LYMPH%: 31.3 % (ref 14.0–48.0)
MCH: 34.1 pg — ABNORMAL HIGH (ref 28.0–33.4)
MCHC: 35.4 g/dL (ref 32.0–35.9)
MCV: 96 fL (ref 82–98)
MONO#: 0.5 10*3/uL (ref 0.1–0.9)
MONO%: 10 % (ref 0.0–13.0)
NEUT#: 2.7 10*3/uL (ref 1.5–6.5)
NEUT%: 54.5 % (ref 40.0–80.0)
Platelets: 180 10*3/uL (ref 145–400)
RBC: 4.1 10*6/uL — ABNORMAL LOW (ref 4.20–5.70)
RDW: 12.5 % (ref 11.1–15.7)
WBC: 5 10*3/uL (ref 4.0–10.0)

## 2014-11-17 LAB — COMPREHENSIVE METABOLIC PANEL
ALT: 15 U/L (ref 0–53)
AST: 16 U/L (ref 0–37)
Albumin: 4.4 g/dL (ref 3.5–5.2)
Alkaline Phosphatase: 31 U/L — ABNORMAL LOW (ref 39–117)
BUN: 39 mg/dL — ABNORMAL HIGH (ref 6–23)
CO2: 29 mEq/L (ref 19–32)
CREATININE: 1.8 mg/dL — AB (ref 0.50–1.35)
Calcium: 10.2 mg/dL (ref 8.4–10.5)
Chloride: 105 mEq/L (ref 96–112)
GLUCOSE: 149 mg/dL — AB (ref 70–99)
POTASSIUM: 4.4 meq/L (ref 3.5–5.3)
SODIUM: 143 meq/L (ref 135–145)
Total Bilirubin: 0.6 mg/dL (ref 0.2–1.2)
Total Protein: 6.7 g/dL (ref 6.0–8.3)

## 2014-11-17 LAB — LACTATE DEHYDROGENASE: LDH: 151 U/L (ref 94–250)

## 2014-11-17 NOTE — Progress Notes (Signed)
Hematology and Oncology Follow Up Visit  Kevin [name] [id number] 1946-01-31 69 y.o. 11/17/2014   Principle Diagnosis:  Stage IA (T1a N0 M0) melanoma of the left foot.  Current Therapy:    Observation     Interim History:  Kevin [name] is back for followup. We see him once a year. He's been doing okay. He was up in [place] for the birth of a niece's daughter. He enjoyed this. Otherwise, he has no other plans for progressive summer.  His left foot and leg is swollen on occasion.  He's had no pain.  He does have a Port-A-Cath in his abdomen. This is used for weight control. Apparently, it is not been accessed for a while.  He's had no problems with bowels or bladder.  He's had no cough. He's had no infections. He's had no fever. He's had no rashes.  Overall, his performance status is ECOG 0.   Medications:  Current outpatient prescriptions:  .  Ascorbic Acid (VITAMIN C) 1000 MG tablet, Take 1,000 mg by mouth daily., Disp: , Rfl:  .  aspirin 81 MG tablet, Take 81 mg by mouth daily., Disp: , Rfl:  .  B Complex Vitamins (VITAMIN B COMPLEX PO), Take 1 tablet by mouth daily., Disp: , Rfl:  .  clopidogrel (PLAVIX) 75 MG tablet, TAKE ONE TABLET BY MOUTH ONCE DAILY., Disp: 30 tablet, Rfl: 10 .  fluticasone (FLONASE) 50 MCG/ACT nasal spray, Place 2 sprays into both nostrils daily., Disp: , Rfl:  .  furosemide (LASIX) 40 MG tablet, Take 40 mg by mouth daily., Disp: , Rfl:  .  metFORMIN (GLUCOPHAGE) 1000 MG tablet, Take 500 mg by mouth 2 (two) times daily with a meal., Disp: , Rfl:  .  metoprolol (TOPROL-XL) 100 MG 24 hr tablet, Take 100 mg by mouth daily.  , Disp: , Rfl:  .  niacin (NIASPAN) 1000 MG CR tablet, Take 1 tablet (1,000 mg total) by mouth at bedtime., Disp: 30 tablet, Rfl: 12 .  NITROSTAT 0.4 MG SL tablet, DISSOLVE 1 TABLET UNDER TONGUE EVERY 5 MINUTES UP TO 15 MIN FOR CHESTPAIN. IF NO RELIEF CALL 911., Disp: 25 tablet, Rfl: 3 .  pravastatin (PRAVACHOL) 40 MG tablet, Take 40  mg by mouth daily., Disp: , Rfl:  .  valsartan-hydrochlorothiazide (DIOVAN-HCT) 160-12.5 MG per tablet, Take 1 tablet by mouth daily.  , Disp: , Rfl:   Allergies:  Allergies  Allergen Reactions  . Altace [Ramipril] Shortness Of Breath  . Avapro [Irbesartan]   . Cephalexin   . Clindamycin Hcl   . Codeine   . Fenofibrate Other (See Comments)    "My skin started peeling off"  . Glycotrol [Aminobrain] Other (See Comments)    unknown  . Indomethacin   . Minocycline Hcl   . Morphine And Related   . Other Other (See Comments)    Contrast dye  . Penicillins   . Prednisone   . Septra [Bactrim]   . Shellfish Allergy Other (See Comments)    Unknown   . Sulfa Drugs Cross Reactors   . Tetracyclines & Related   . Tradjenta [Linagliptin]     Shortness of breath    Past Medical History, Surgical history, Social history, and Family History were reviewed and updated.  Review of Systems: As above  Physical Exam:  weight is 270 lb (122.471 kg). His oral temperature is 98.2 F (36.8 C). His blood pressure is 145/74 and his pulse is 77. His respiration is 18.  Somewhat obese white. Head and neck exam shows no ocular or oral lesions. There are no palpable cervical or supraclavicular lymph nodes. Lungs are clear bilaterally. Cardiac exam regular rate rhythm with no murmurs rubs or bruits. Abdomen is obese but soft. Has good bowel sounds. There is no fluid wave. There is no palpable liver or spleen. He has no adenopathy in the inguinal region. Extremities shows well-healed wide local excision scar on his left foot. Some slight swelling is noted about the lower left leg and ankle. He has good range of motion of his joints. Skin exam shows no suspicious lesions. Neurological exam is nonfocal.  Lab Results  Component Value Date   WBC 5.0 11/17/2014   HGB 14.0 11/17/2014   HCT 39.5 11/17/2014   MCV 96 11/17/2014   PLT 180 11/17/2014     Chemistry      Component Value Date/Time   NA 141  11/18/2013 0940   K 4.1 11/18/2013 0940   CL 102 11/18/2013 0940   CO2 29 11/18/2013 0940   BUN 19 11/18/2013 0940   CREATININE 1.41* 11/18/2013 0940   CREATININE 1.31 07/14/2013 1128      Component Value Date/Time   CALCIUM 9.8 11/18/2013 0940   ALKPHOS 33* 11/18/2013 0940   AST 13 11/18/2013 0940   ALT 16 11/18/2013 0940   BILITOT 0.6 11/18/2013 0940         Impression and Plan: Kevin [name] is a 69 year old gentleman with a stage Ia melanoma of the left foot. He underwent resection back in October of 2011. He's doing well. I don't see any evidence of recurrence.  I don't see going to do any scans on him.  We will plan to get him back in 1 more year.  Volanda Napoleon, MD 7/15/201610:37 AM

## 2014-11-20 ENCOUNTER — Telehealth: Payer: Self-pay | Admitting: *Deleted

## 2014-11-20 NOTE — Telephone Encounter (Addendum)
Reviewed results and instructions with patient. He is in understanding and will make an effort to stay hydrated.   ----- Message from Volanda Napoleon, MD sent at 11/17/2014  5:24 PM EDT ----- Call - your kidney function is a little lower!! Please make sure you drink enough!!!! Do NOT get de-hydrated or your kidneys could fail you!!  Please be careful!!  Kevin [name]

## 2014-12-18 ENCOUNTER — Other Ambulatory Visit: Payer: Self-pay | Admitting: Cardiovascular Disease

## 2014-12-18 NOTE — Telephone Encounter (Signed)
Rx(s) sent to pharmacy electronically.  

## 2015-01-10 ENCOUNTER — Telehealth: Payer: Self-pay | Admitting: Cardiovascular Disease

## 2015-01-10 NOTE — Telephone Encounter (Signed)
Closed encounter °

## 2015-02-13 ENCOUNTER — Inpatient Hospital Stay (HOSPITAL_COMMUNITY)
Admission: EM | Admit: 2015-02-13 | Discharge: 2015-02-15 | DRG: 247 | Disposition: A | Discharge status: Home / Self Care | Payer: Medicare Other | Attending: Cardiovascular Disease | Admitting: Cardiovascular Disease

## 2015-02-13 ENCOUNTER — Emergency Department (HOSPITAL_COMMUNITY): Payer: Medicare Other

## 2015-02-13 ENCOUNTER — Encounter (HOSPITAL_COMMUNITY): Payer: Self-pay | Admitting: *Deleted

## 2015-02-13 DIAGNOSIS — E1122 Type 2 diabetes mellitus with diabetic chronic kidney disease: Secondary | ICD-10-CM | POA: Diagnosis present

## 2015-02-13 DIAGNOSIS — I10 Essential (primary) hypertension: Secondary | ICD-10-CM | POA: Diagnosis present

## 2015-02-13 DIAGNOSIS — E669 Obesity, unspecified: Secondary | ICD-10-CM | POA: Diagnosis present

## 2015-02-13 DIAGNOSIS — N183 Chronic kidney disease, stage 3 (moderate): Secondary | ICD-10-CM | POA: Diagnosis present

## 2015-02-13 DIAGNOSIS — I2510 Atherosclerotic heart disease of native coronary artery without angina pectoris: Secondary | ICD-10-CM | POA: Diagnosis present

## 2015-02-13 DIAGNOSIS — Z885 Allergy status to narcotic agent status: Secondary | ICD-10-CM | POA: Diagnosis not present

## 2015-02-13 DIAGNOSIS — Z6838 Body mass index (BMI) 38.0-38.9, adult: Secondary | ICD-10-CM

## 2015-02-13 DIAGNOSIS — Z881 Allergy status to other antibiotic agents status: Secondary | ICD-10-CM

## 2015-02-13 DIAGNOSIS — I129 Hypertensive chronic kidney disease with stage 1 through stage 4 chronic kidney disease, or unspecified chronic kidney disease: Secondary | ICD-10-CM | POA: Diagnosis present

## 2015-02-13 DIAGNOSIS — R079 Chest pain, unspecified: Secondary | ICD-10-CM | POA: Diagnosis present

## 2015-02-13 DIAGNOSIS — Z8249 Family history of ischemic heart disease and other diseases of the circulatory system: Secondary | ICD-10-CM

## 2015-02-13 DIAGNOSIS — Z87891 Personal history of nicotine dependence: Secondary | ICD-10-CM

## 2015-02-13 DIAGNOSIS — G4733 Obstructive sleep apnea (adult) (pediatric): Secondary | ICD-10-CM | POA: Diagnosis present

## 2015-02-13 DIAGNOSIS — Z7982 Long term (current) use of aspirin: Secondary | ICD-10-CM

## 2015-02-13 DIAGNOSIS — Z79899 Other long term (current) drug therapy: Secondary | ICD-10-CM

## 2015-02-13 DIAGNOSIS — Z9109 Other allergy status, other than to drugs and biological substances: Secondary | ICD-10-CM | POA: Diagnosis not present

## 2015-02-13 DIAGNOSIS — E782 Mixed hyperlipidemia: Secondary | ICD-10-CM | POA: Diagnosis present

## 2015-02-13 DIAGNOSIS — Z7902 Long term (current) use of antithrombotics/antiplatelets: Secondary | ICD-10-CM | POA: Diagnosis not present

## 2015-02-13 DIAGNOSIS — Z91041 Radiographic dye allergy status: Secondary | ICD-10-CM

## 2015-02-13 DIAGNOSIS — E785 Hyperlipidemia, unspecified: Secondary | ICD-10-CM | POA: Diagnosis present

## 2015-02-13 DIAGNOSIS — Z880 Allergy status to penicillin: Secondary | ICD-10-CM

## 2015-02-13 DIAGNOSIS — Z833 Family history of diabetes mellitus: Secondary | ICD-10-CM

## 2015-02-13 DIAGNOSIS — Z7984 Long term (current) use of oral hypoglycemic drugs: Secondary | ICD-10-CM | POA: Diagnosis not present

## 2015-02-13 DIAGNOSIS — I209 Angina pectoris, unspecified: Secondary | ICD-10-CM | POA: Diagnosis not present

## 2015-02-13 DIAGNOSIS — T82855A Stenosis of coronary artery stent, initial encounter: Secondary | ICD-10-CM | POA: Diagnosis present

## 2015-02-13 DIAGNOSIS — I252 Old myocardial infarction: Secondary | ICD-10-CM

## 2015-02-13 DIAGNOSIS — I249 Acute ischemic heart disease, unspecified: Secondary | ICD-10-CM | POA: Diagnosis present

## 2015-02-13 DIAGNOSIS — I214 Non-ST elevation (NSTEMI) myocardial infarction: Secondary | ICD-10-CM | POA: Diagnosis not present

## 2015-02-13 HISTORY — DX: Old myocardial infarction: I25.2

## 2015-02-13 LAB — CBC WITH DIFFERENTIAL/PLATELET
BASOS ABS: 0.1 10*3/uL (ref 0.0–0.1)
BASOS PCT: 1 %
Eosinophils Absolute: 0.2 10*3/uL (ref 0.0–0.7)
Eosinophils Relative: 3 %
HEMATOCRIT: 39 % (ref 39.0–52.0)
HEMOGLOBIN: 13.4 g/dL (ref 13.0–17.0)
LYMPHS PCT: 39 %
Lymphs Abs: 2.1 10*3/uL (ref 0.7–4.0)
MCH: 33.1 pg (ref 26.0–34.0)
MCHC: 34.4 g/dL (ref 30.0–36.0)
MCV: 96.3 fL (ref 78.0–100.0)
MONO ABS: 0.5 10*3/uL (ref 0.1–1.0)
Monocytes Relative: 10 %
NEUTROS ABS: 2.5 10*3/uL (ref 1.7–7.7)
NEUTROS PCT: 47 %
Platelets: 192 10*3/uL (ref 150–400)
RBC: 4.05 MIL/uL — AB (ref 4.22–5.81)
RDW: 13 % (ref 11.5–15.5)
WBC: 5.3 10*3/uL (ref 4.0–10.5)

## 2015-02-13 LAB — COMPREHENSIVE METABOLIC PANEL
ALBUMIN: 4.5 g/dL (ref 3.5–5.0)
ALK PHOS: 31 U/L — AB (ref 38–126)
ALT: 19 U/L (ref 17–63)
ANION GAP: 7 (ref 5–15)
AST: 18 U/L (ref 15–41)
BUN: 34 mg/dL — AB (ref 6–20)
CALCIUM: 9.5 mg/dL (ref 8.9–10.3)
CO2: 27 mmol/L (ref 22–32)
Chloride: 105 mmol/L (ref 101–111)
Creatinine, Ser: 1.37 mg/dL — ABNORMAL HIGH (ref 0.61–1.24)
GFR calc Af Amer: 60 mL/min — ABNORMAL LOW (ref >60)
GFR calc non Af Amer: 51 mL/min — ABNORMAL LOW (ref >60)
GLUCOSE: 107 mg/dL — AB (ref 65–99)
POTASSIUM: 3.8 mmol/L (ref 3.5–5.1)
SODIUM: 139 mmol/L (ref 135–145)
Total Bilirubin: 0.5 mg/dL (ref 0.3–1.2)
Total Protein: 7 g/dL (ref 6.5–8.1)

## 2015-02-13 LAB — PROTIME-INR
INR: 1.03 (ref 0.00–1.49)
PROTHROMBIN TIME: 13.7 s (ref 11.6–15.2)

## 2015-02-13 LAB — TROPONIN I: Troponin I: 0.08 ng/mL — ABNORMAL HIGH (ref <0.031)

## 2015-02-13 LAB — I-STAT TROPONIN, ED: Troponin i, poc: 0.08 ng/mL (ref 0.00–0.08)

## 2015-02-13 LAB — LIPASE, BLOOD: Lipase: 44 U/L (ref 22–51)

## 2015-02-13 NOTE — ED Notes (Signed)
Pt reporting CP and SOB worsening this afternoon.  Reporting pain in left shoulder, jaw and into back.  Denies nausea however.  Pt does have history stent placement x3.

## 2015-02-13 NOTE — ED Provider Notes (Signed)
CSN: [id number]     Arrival date & time 02/13/15  1903 History   First MD Initiated Contact with Patient 02/13/15 1907     Chief Complaint  Patient presents with  . Chest Pain     (Consider location/radiation/quality/duration/timing/severity/associated sxs/prior Treatment) HPI  Patient is a very pleasant 69 year old male presenting with history of melanoma presenting with chest pain. Patient's extensive cardiac history. Patient's most recent catheterization was in March 2015 which showed a occlusion of the RCA, patent LAD and circumflex stents, 50% proximal to mid disease of the first obtuse with a jailed diagonal with 90% stenosis.   Over the last couple weeks he's had intermittent chest pain with exertion. Today he was walking at [facility] double check chest pain radiating to his jaw left shoulder and shortness of breath. When he stopped for rest, he improved.     Past Medical History  Diagnosis Date  . Melanoma (Standard) 10/11    left foot  . Cellulitis 5/11    left leg  . CAD (coronary artery disease)   . HTN (hypertension)   . Diabetes mellitus   . Obesity   . Seasonal allergies   . S/P colonoscopy 07/03/03     Dr [name]->hyperplastic rectal polyp  . Diverticulosis   . Sleep apnea   . Microscopic colitis 10/09/10    Colonoscopy dr [name]  . Diverticulosis   . S/P endoscopy 10/09/10    lap band intact, duodenal erosions  . Melanoma of foot ([place]) 06/02/2011  . Hyperlipidemia   . OSA on CPAP 01/20/2014   Past Surgical History  Procedure Laterality Date  . Appendectomy    . Shoulder surgery      left  . Knee surgery      left  . Penile prosthesis implant    . Coronary angioplasty  2002/2005/2008  . Coronary angioplasty with stent placement  06/11/2007    PTCA & stenting distal CX, PTCA & balloon angioplasty in-stent restenotic mid CX coronary artery stent  . Nm myocar perf wall motion  06/24/2011    small mild fixed anteroseptal & anteroapical defects.  . Left heart  catheterization with coronary angiogram N/A 07/15/2013    Procedure: LEFT HEART CATHETERIZATION WITH CORONARY ANGIOGRAM;  Surgeon: Sinclair Grooms, MD;  Location: [facility] CATH LAB;  Service: Cardiovascular;  Laterality: N/A;   Family History  Problem Relation Age of Onset  . Liver cancer Brother 80  . Celiac disease Other 78    brother's daughter  . Coronary artery disease Mother   . Diabetes Father    Social History  Substance Use Topics  . Smoking status: Former Smoker -- 3.00 packs/day for 30 years    Types: Cigarettes    Start date: 11/18/1964    Quit date: 10/04/1994  . Smokeless tobacco: Never Used     Comment: quit smoking 19 years ago  . Alcohol Use: Yes     Comment: 3-4 times/year    Review of Systems  Constitutional: Negative for fever and activity change.  HENT: Positive for hearing loss. Negative for congestion.   Eyes: Negative for discharge and redness.  Respiratory: Positive for shortness of breath. Negative for cough.   Cardiovascular: Positive for chest pain.  Gastrointestinal: Negative for abdominal pain.  Genitourinary: Negative for dysuria and urgency.  Musculoskeletal: Negative for arthralgias.  Allergic/Immunologic: Negative for immunocompromised state.  Neurological: Positive for weakness. Negative for speech difficulty.  Psychiatric/Behavioral: Negative for behavioral problems and agitation.  All other systems reviewed and are  negative.     Allergies  Altace; Avapro; Cephalexin; Clindamycin hcl; Codeine; Fenofibrate; Glycotrol; Indomethacin; Minocycline hcl; Morphine and related; Other; Penicillins; Prednisone; Septra; Shellfish allergy; Sulfa drugs cross reactors; Tetracyclines & related; and Tradjenta  Home Medications   Prior to Admission medications   Medication Sig Start Date End Date Taking? Authorizing Provider  Ascorbic Acid (VITAMIN C) 1000 MG tablet Take 1,000 mg by mouth daily.    Historical Provider, MD  aspirin 81 MG tablet Take 81 mg  by mouth daily.    Historical Provider, MD  B Complex Vitamins (VITAMIN B COMPLEX PO) Take 1 tablet by mouth daily.    Historical Provider, MD  clopidogrel (PLAVIX) 75 MG tablet TAKE ONE TABLET BY MOUTH ONCE DAILY. 09/27/14   Lorretta Harp, MD  fluticasone (FLONASE) 50 MCG/ACT nasal spray Place 2 sprays into both nostrils daily.    Historical Provider, MD  furosemide (LASIX) 40 MG tablet Take 40 mg by mouth daily.    Historical Provider, MD  metFORMIN (GLUCOPHAGE) 1000 MG tablet Take 500 mg by mouth 2 (two) times daily with a meal.    Historical Provider, MD  metoprolol (TOPROL-XL) 100 MG 24 hr tablet Take 100 mg by mouth daily.      Historical Provider, MD  niacin (NIASPAN) 1000 MG CR tablet TAKE 1 TABLET BY MOUTH AT BEDTIME. 12/18/14   Lorretta Harp, MD  NITROSTAT 0.4 MG SL tablet DISSOLVE 1 TABLET UNDER TONGUE EVERY 5 MINUTES UP TO 15 MIN FOR CHESTPAIN. IF NO RELIEF CALL 911. 07/31/14   Lorretta Harp, MD  pravastatin (PRAVACHOL) 40 MG tablet Take 40 mg by mouth daily.    Historical Provider, MD  valsartan-hydrochlorothiazide (DIOVAN-HCT) 160-12.5 MG per tablet Take 1 tablet by mouth daily.      Historical Provider, MD   BP 110/73 mmHg  Pulse 71  Temp(Src) 97.9 F (36.6 C) (Oral)  Resp 16  Ht 5\' 9"  (1.753 m)  Wt 260 lb (117.935 kg)  BMI 38.38 kg/m2  SpO2 99% Physical Exam  Constitutional: He is oriented to person, place, and time. He appears well-nourished.  Obese  HENT:  Head: Normocephalic.  Mouth/Throat: Oropharynx is clear and moist.  Eyes: Conjunctivae are normal.  Neck: No tracheal deviation present.  Cardiovascular: Normal rate.  Exam reveals no friction rub.   No murmur heard. Pulmonary/Chest: Effort normal. No stridor. No respiratory distress.  Abdominal: Soft. There is no tenderness. There is no guarding.  Musculoskeletal: Normal range of motion. He exhibits no edema.  Neurological: He is oriented to person, place, and time. No cranial nerve deficit.  Skin: Skin  is warm and dry. No rash noted. He is not diaphoretic.  Psychiatric: He has a normal mood and affect. His behavior is normal.  Nursing note and vitals reviewed.   ED Course  Procedures (including critical care time) Labs Review Labs Reviewed  CBC WITH DIFFERENTIAL/PLATELET - Abnormal; Notable for the following:    RBC 4.05 (*)    All other components within normal limits  TROPONIN I  BASIC METABOLIC PANEL  COMPREHENSIVE METABOLIC PANEL  CBC WITH DIFFERENTIAL/PLATELET  LIPASE, BLOOD  PROTIME-INR  I-STAT TROPOININ, ED    Imaging Review No results found. I have personally reviewed and evaluated these images and lab results as part of my medical decision-making.   EKG Interpretation   Date/Time:  Tuesday February 13 2015 19:12:36 EDT Ventricular Rate:  70 PR Interval:  187 QRS Duration: 89 QT Interval:  387 QTC Calculation: 418 R  Axis:   27 Text Interpretation:  Sinus rhythm Borderline T wave abnormalities no  acute ischemia No significant change since last tracing Confirmed by  Gerald Leitz ([id number]) on 02/13/2015 7:25:18 PM      MDM   Final diagnoses:  None  Patient is a very pleasant 69 year old gentleman with extensive cardiac history. Patient's had 3 stents placed. Most recently in March 2015. Patient states this feels similar to the last time she had that stented. He's been having exertional chest pain for the last 3 weeks. Patient's cardiologist is Dr. [name] from [place].  Patient's most recent catheterization was in March 2015 which showed a occlusion of the RCA, patent LAD and circumflex stents, 50% proximal to mid disease of the first obtuse with a jailed diagonal with 90% stenosis.  Concerned that this is exertional chest pain.  9:22 PM Discussed with hospitalist at [facility] bleed is more appropriate to consult cardiologist [facility]. Awaiting consult.  Will transfer to cards at [facility], MD 02/14/15 1557

## 2015-02-14 ENCOUNTER — Encounter (HOSPITAL_COMMUNITY)
Admission: EM | Disposition: A | Discharge status: Home / Self Care | Payer: Self-pay | Source: Home / Self Care | Attending: Cardiovascular Disease

## 2015-02-14 DIAGNOSIS — I2510 Atherosclerotic heart disease of native coronary artery without angina pectoris: Secondary | ICD-10-CM

## 2015-02-14 DIAGNOSIS — I10 Essential (primary) hypertension: Secondary | ICD-10-CM

## 2015-02-14 DIAGNOSIS — I214 Non-ST elevation (NSTEMI) myocardial infarction: Secondary | ICD-10-CM | POA: Insufficient documentation

## 2015-02-14 DIAGNOSIS — I209 Angina pectoris, unspecified: Secondary | ICD-10-CM

## 2015-02-14 DIAGNOSIS — E785 Hyperlipidemia, unspecified: Secondary | ICD-10-CM

## 2015-02-14 DIAGNOSIS — I249 Acute ischemic heart disease, unspecified: Secondary | ICD-10-CM | POA: Diagnosis present

## 2015-02-14 HISTORY — PX: CARDIAC CATHETERIZATION: SHX172

## 2015-02-14 LAB — LIPID PANEL
CHOL/HDL RATIO: 4.7 ratio
CHOLESTEROL: 165 mg/dL (ref 0–200)
HDL: 35 mg/dL — ABNORMAL LOW (ref >40)
LDL CALC: 90 mg/dL (ref 0–99)
Triglycerides: 200 mg/dL — ABNORMAL HIGH (ref <150)
VLDL: 40 mg/dL (ref 0–40)

## 2015-02-14 LAB — GLUCOSE, CAPILLARY
GLUCOSE-CAPILLARY: 127 mg/dL — AB (ref 65–99)
GLUCOSE-CAPILLARY: 161 mg/dL — AB (ref 65–99)
Glucose-Capillary: 136 mg/dL — ABNORMAL HIGH (ref 65–99)
Glucose-Capillary: 197 mg/dL — ABNORMAL HIGH (ref 65–99)

## 2015-02-14 LAB — TROPONIN I
TROPONIN I: 0.21 ng/mL — AB (ref <0.031)
Troponin I: 0.18 ng/mL — ABNORMAL HIGH (ref <0.031)
Troponin I: 0.2 ng/mL — ABNORMAL HIGH (ref <0.031)

## 2015-02-14 LAB — MRSA PCR SCREENING: MRSA by PCR: NEGATIVE

## 2015-02-14 LAB — CREATININE, SERUM
Creatinine, Ser: 1.44 mg/dL — ABNORMAL HIGH (ref 0.61–1.24)
GFR calc non Af Amer: 48 mL/min — ABNORMAL LOW (ref >60)
GFR, EST AFRICAN AMERICAN: 56 mL/min — AB (ref >60)

## 2015-02-14 LAB — CBC
HEMATOCRIT: 41.1 % (ref 39.0–52.0)
HEMATOCRIT: 39.8 % (ref 39.0–52.0)
HEMOGLOBIN: 13.4 g/dL (ref 13.0–17.0)
Hemoglobin: 14 g/dL (ref 13.0–17.0)
MCH: 32.6 pg (ref 26.0–34.0)
MCH: 32.1 pg (ref 26.0–34.0)
MCHC: 34.1 g/dL (ref 30.0–36.0)
MCHC: 33.7 g/dL (ref 30.0–36.0)
MCV: 95.6 fL (ref 78.0–100.0)
MCV: 95.4 fL (ref 78.0–100.0)
PLATELETS: 188 10*3/uL (ref 150–400)
Platelets: 174 10*3/uL (ref 150–400)
RBC: 4.3 MIL/uL (ref 4.22–5.81)
RBC: 4.17 MIL/uL — AB (ref 4.22–5.81)
RDW: 13.2 % (ref 11.5–15.5)
RDW: 12.9 % (ref 11.5–15.5)
WBC: 5.7 10*3/uL (ref 4.0–10.5)
WBC: 5.4 10*3/uL (ref 4.0–10.5)

## 2015-02-14 LAB — BASIC METABOLIC PANEL
Anion gap: 9 (ref 5–15)
BUN: 23 mg/dL — AB (ref 6–20)
CHLORIDE: 103 mmol/L (ref 101–111)
CO2: 28 mmol/L (ref 22–32)
CREATININE: 1.35 mg/dL — AB (ref 0.61–1.24)
Calcium: 9.8 mg/dL (ref 8.9–10.3)
GFR calc Af Amer: >60 mL/min (ref >60)
GFR calc non Af Amer: 52 mL/min — ABNORMAL LOW (ref >60)
GLUCOSE: 140 mg/dL — AB (ref 65–99)
Potassium: 4.7 mmol/L (ref 3.5–5.1)
Sodium: 140 mmol/L (ref 135–145)

## 2015-02-14 LAB — HEPARIN LEVEL (UNFRACTIONATED): HEPARIN UNFRACTIONATED: 0.25 [IU]/mL — AB (ref 0.30–0.70)

## 2015-02-14 LAB — POCT ACTIVATED CLOTTING TIME
ACTIVATED CLOTTING TIME: 276 s
ACTIVATED CLOTTING TIME: 300 s

## 2015-02-14 LAB — TSH: TSH: 2.951 u[IU]/mL (ref 0.350–4.500)

## 2015-02-14 SURGERY — LEFT HEART CATH AND CORONARY ANGIOGRAPHY

## 2015-02-14 MED ORDER — SODIUM CHLORIDE 0.9 % IJ SOLN: 3.0000 mL | INTRAMUSCULAR | Status: DC | PRN

## 2015-02-14 MED ORDER — LIDOCAINE HCL (PF) 1 % IJ SOLN
INTRAMUSCULAR | Status: AC
  Filled 2015-02-14: qty 30

## 2015-02-14 MED ORDER — NITROGLYCERIN 1 MG/10 ML FOR IR/CATH LAB
INTRA_ARTERIAL | Status: DC | PRN
  Administered 2015-02-14: 15:00:00

## 2015-02-14 MED ORDER — ASPIRIN 81 MG PO CHEW: 81.0000 mg | CHEWABLE_TABLET | Freq: Every day | ORAL | Status: DC

## 2015-02-14 MED ORDER — HEPARIN SODIUM (PORCINE) 5000 UNIT/ML IJ SOLN
5000.0000 [IU] | Freq: Three times a day (TID) | INTRAMUSCULAR | Status: DC
  Administered 2015-02-15: 07:00:00 5000 [IU] via SUBCUTANEOUS

## 2015-02-14 MED ORDER — ACETAMINOPHEN 325 MG PO TABS: 650.0000 mg | ORAL_TABLET | ORAL | Status: DC | PRN

## 2015-02-14 MED ORDER — FLUTICASONE PROPIONATE 50 MCG/ACT NA SUSP
2.0000 | Freq: Every day | NASAL | Status: DC
  Filled 2015-02-14: qty 16

## 2015-02-14 MED ORDER — HEPARIN (PORCINE) IN NACL 2-0.9 UNIT/ML-% IJ SOLN
INTRAMUSCULAR | Status: AC
  Filled 2015-02-14: qty 500

## 2015-02-14 MED ORDER — SODIUM CHLORIDE 0.9 % IV SOLN: 250.0000 mL | INTRAVENOUS | Status: DC | PRN

## 2015-02-14 MED ORDER — CLOPIDOGREL BISULFATE 75 MG PO TABS: 75.0000 mg | ORAL_TABLET | Freq: Every day | ORAL | Status: DC

## 2015-02-14 MED ORDER — FENTANYL CITRATE (PF) 100 MCG/2ML IJ SOLN
INTRAMUSCULAR | Status: DC | PRN
  Administered 2015-02-14 (×3): 25 ug via INTRAVENOUS

## 2015-02-14 MED ORDER — MIDAZOLAM HCL 2 MG/2ML IJ SOLN
INTRAMUSCULAR | Status: AC
  Filled 2015-02-14: qty 4

## 2015-02-14 MED ORDER — METOPROLOL SUCCINATE ER 100 MG PO TB24: 100.0000 mg | ORAL_TABLET | Freq: Every day | ORAL | Status: DC

## 2015-02-14 MED ORDER — CLOPIDOGREL BISULFATE 75 MG PO TABS
75.0000 mg | ORAL_TABLET | Freq: Every day | ORAL | Status: DC
  Administered 2015-02-15: 75 mg via ORAL
  Filled 2015-02-14: qty 1

## 2015-02-14 MED ORDER — SODIUM CHLORIDE 0.9 % WEIGHT BASED INFUSION: 1.0000 mL/kg/h | INTRAVENOUS | Status: DC

## 2015-02-14 MED ORDER — NIACIN ER (ANTIHYPERLIPIDEMIC) 500 MG PO TBCR
1000.0000 mg | EXTENDED_RELEASE_TABLET | Freq: Every day | ORAL | Status: DC
  Administered 2015-02-14: 22:00:00 1000 mg via ORAL
  Filled 2015-02-14 (×4): qty 2

## 2015-02-14 MED ORDER — NITROGLYCERIN 0.4 MG SL SUBL: 0.4000 mg | SUBLINGUAL_TABLET | SUBLINGUAL | Status: DC | PRN

## 2015-02-14 MED ORDER — SODIUM CHLORIDE 0.9 % IJ SOLN
3.0000 mL | Freq: Two times a day (BID) | INTRAMUSCULAR | Status: DC
  Administered 2015-02-14: 3 mL via INTRAVENOUS

## 2015-02-14 MED ORDER — SODIUM CHLORIDE 0.9 % IJ SOLN
3.0000 mL | Freq: Two times a day (BID) | INTRAMUSCULAR | Status: DC
  Administered 2015-02-14: 22:00:00 3 mL via INTRAVENOUS

## 2015-02-14 MED ORDER — HYDROCHLOROTHIAZIDE 12.5 MG PO CAPS
12.5000 mg | ORAL_CAPSULE | Freq: Every day | ORAL | Status: DC
  Administered 2015-02-15: 12.5 mg via ORAL
  Filled 2015-02-14: qty 1

## 2015-02-14 MED ORDER — MIDAZOLAM HCL 2 MG/2ML IJ SOLN
INTRAMUSCULAR | Status: DC | PRN
  Administered 2015-02-14 (×2): 1 mg via INTRAVENOUS
  Administered 2015-02-14: 2 mg via INTRAVENOUS

## 2015-02-14 MED ORDER — SODIUM CHLORIDE 0.9 % IV SOLN
INTRAVENOUS | Status: AC
  Administered 2015-02-14: 16:00:00 via INTRAVENOUS

## 2015-02-14 MED ORDER — METHYLPREDNISOLONE SODIUM SUCC 125 MG IJ SOLR
125.0000 mg | Freq: Once | INTRAMUSCULAR | Status: AC
  Administered 2015-02-14: 125 mg via INTRAVENOUS
  Filled 2015-02-14: qty 2

## 2015-02-14 MED ORDER — HEPARIN (PORCINE) IN NACL 100-0.45 UNIT/ML-% IJ SOLN
1600.0000 [IU]/h | INTRAMUSCULAR | Status: DC
  Administered 2015-02-14: 1400 [IU]/h via INTRAVENOUS
  Filled 2015-02-14: qty 250

## 2015-02-14 MED ORDER — DIPHENHYDRAMINE HCL 50 MG/ML IJ SOLN
25.0000 mg | Freq: Once | INTRAMUSCULAR | Status: AC
  Administered 2015-02-14: 25 mg via INTRAVENOUS
  Filled 2015-02-14: qty 1

## 2015-02-14 MED ORDER — METOPROLOL SUCCINATE ER 50 MG PO TB24
100.0000 mg | ORAL_TABLET | Freq: Every day | ORAL | Status: DC
  Administered 2015-02-15: 08:00:00 100 mg via ORAL
  Filled 2015-02-14 (×2): qty 2

## 2015-02-14 MED ORDER — FENTANYL CITRATE (PF) 100 MCG/2ML IJ SOLN
INTRAMUSCULAR | Status: AC
  Filled 2015-02-14: qty 4

## 2015-02-14 MED ORDER — CLOPIDOGREL BISULFATE 300 MG PO TABS
ORAL_TABLET | ORAL | Status: DC | PRN
  Administered 2015-02-14: 300 mg via ORAL

## 2015-02-14 MED ORDER — NON FORMULARY: 160.0000 mg | Freq: Every day | Status: DC

## 2015-02-14 MED ORDER — ASPIRIN EC 81 MG PO TBEC
81.0000 mg | DELAYED_RELEASE_TABLET | Freq: Every day | ORAL | Status: DC
  Administered 2015-02-15: 08:00:00 81 mg via ORAL
  Filled 2015-02-14: qty 1

## 2015-02-14 MED ORDER — SODIUM CHLORIDE 0.9 % WEIGHT BASED INFUSION
3.0000 mL/kg/h | INTRAVENOUS | Status: DC
  Administered 2015-02-14: 3 mL/kg/h via INTRAVENOUS

## 2015-02-14 MED ORDER — NITROGLYCERIN 1 MG/10 ML FOR IR/CATH LAB
INTRA_ARTERIAL | Status: AC
  Filled 2015-02-14: qty 10

## 2015-02-14 MED ORDER — HEPARIN SODIUM (PORCINE) 1000 UNIT/ML IJ SOLN
INTRAMUSCULAR | Status: AC
  Filled 2015-02-14: qty 1

## 2015-02-14 MED ORDER — HEPARIN SODIUM (PORCINE) 1000 UNIT/ML IJ SOLN
INTRAMUSCULAR | Status: DC | PRN
  Administered 2015-02-14: 5000 [IU] via INTRAVENOUS
  Administered 2015-02-14: 6000 [IU] via INTRAVENOUS
  Administered 2015-02-14: 2000 [IU] via INTRAVENOUS

## 2015-02-14 MED ORDER — ASPIRIN 81 MG PO CHEW
81.0000 mg | CHEWABLE_TABLET | ORAL | Status: AC
  Administered 2015-02-14: 81 mg via ORAL
  Filled 2015-02-14: qty 1

## 2015-02-14 MED ORDER — ONDANSETRON HCL 4 MG/2ML IJ SOLN: 4.0000 mg | Freq: Four times a day (QID) | INTRAMUSCULAR | Status: DC | PRN

## 2015-02-14 MED ORDER — ASPIRIN 81 MG PO TABS: 81.0000 mg | ORAL_TABLET | Freq: Every day | ORAL | Status: DC

## 2015-02-14 MED ORDER — HEPARIN BOLUS VIA INFUSION
4000.0000 [IU] | Freq: Once | INTRAVENOUS | Status: AC
  Administered 2015-02-14: 4000 [IU] via INTRAVENOUS
  Filled 2015-02-14: qty 4000

## 2015-02-14 MED ORDER — PRAVASTATIN SODIUM 40 MG PO TABS: 40.0000 mg | ORAL_TABLET | Freq: Every day | ORAL | Status: DC

## 2015-02-14 MED ORDER — FAMOTIDINE IN NACL 20-0.9 MG/50ML-% IV SOLN
20.0000 mg | Freq: Once | INTRAVENOUS | Status: AC
  Administered 2015-02-14: 20 mg via INTRAVENOUS
  Filled 2015-02-14: qty 50

## 2015-02-14 MED ORDER — PRAVASTATIN SODIUM 40 MG PO TABS
40.0000 mg | ORAL_TABLET | Freq: Every day | ORAL | Status: DC
  Administered 2015-02-14: 20:00:00 40 mg via ORAL
  Filled 2015-02-14: qty 1

## 2015-02-14 MED ORDER — VERAPAMIL HCL 2.5 MG/ML IV SOLN
INTRAVENOUS | Status: AC
  Filled 2015-02-14: qty 2

## 2015-02-14 MED ORDER — VERAPAMIL HCL 2.5 MG/ML IV SOLN
INTRAVENOUS | Status: DC | PRN
  Administered 2015-02-14: 14:00:00 via INTRA_ARTERIAL

## 2015-02-14 MED ORDER — VALSARTAN 160 MG PO TABS
160.0000 mg | ORAL_TABLET | Freq: Every day | ORAL | Status: DC
  Filled 2015-02-14: qty 1

## 2015-02-14 MED ORDER — CLOPIDOGREL BISULFATE 300 MG PO TABS
ORAL_TABLET | ORAL | Status: AC
  Filled 2015-02-14: qty 1

## 2015-02-14 MED ORDER — SODIUM CHLORIDE 0.9 % IV SOLN
Freq: Once | INTRAVENOUS | Status: AC
  Administered 2015-02-14: 11:00:00 via INTRAVENOUS

## 2015-02-14 SURGICAL SUPPLY — 21 items
BALLN EMERGE MR 2.5X15 (BALLOONS) ×2
BALLN ~~LOC~~ EMERGE MR 3.75X12 (BALLOONS) ×2
BALLOON EMERGE MR 2.5X15 (BALLOONS) IMPLANT
BALLOON ~~LOC~~ EMERGE MR 3.75X12 (BALLOONS) IMPLANT
CATH INFINITI 5 FR JL3.5 (CATHETERS) ×2 IMPLANT
CATH INFINITI 5FR ANG PIGTAIL (CATHETERS) ×2 IMPLANT
CATH INFINITI JR4 5F (CATHETERS) ×2 IMPLANT
DEVICE RAD COMP TR BAND LRG (VASCULAR PRODUCTS) ×2 IMPLANT
GLIDESHEATH SLEND SS 6F .021 (SHEATH) ×2 IMPLANT
GUIDE CATH RUNWAY 6FR CLS3 (CATHETERS) ×1 IMPLANT
KIT ENCORE 26 ADVANTAGE (KITS) ×1 IMPLANT
KIT HEART LEFT (KITS) ×2 IMPLANT
PACK CARDIAC CATHETERIZATION (CUSTOM PROCEDURE TRAY) ×2 IMPLANT
STENT SYNERGY DES 3.5X28 (Permanent Stent) ×1 IMPLANT
SYR MEDRAD MARK V 150ML (SYRINGE) ×2 IMPLANT
TRANSDUCER W/STOPCOCK (MISCELLANEOUS) ×2 IMPLANT
TUBING CIL FLEX 10 FLL-RA (TUBING) ×2 IMPLANT
VALVE GUARDIAN II ~~LOC~~ HEMO (MISCELLANEOUS) ×1 IMPLANT
WIRE ASAHI PROWATER 180CM (WIRE) ×1 IMPLANT
WIRE HI TORQ BMW 190CM (WIRE) ×1 IMPLANT
WIRE SAFE-T 1.5MM-J .035X260CM (WIRE) ×2 IMPLANT

## 2015-02-14 NOTE — H&P (Signed)
HPI: [name] is a 69 year old obese man with CAD s/p PCI in past, HTN, HLD and DM who presented to [facility] ED with CC of CP.  He reports that over the last 2-3 weeks he has had a few episodes of CP that have been very brief in duration.  However, today he had an episode while walking that lasted a few minutes and resolved with rest.  This episode was more severe in intensity and radiated to jaw.  However, he denies any other associated symptoms.  He reports being in [place] and driving back home where he met a friend for coffee.  However, after putting some thought into the event, he decided to go to [facility] ED for further evaluation which revealed a mildly elevated troponin of 0.08 prompting transfer to [facility].    Of note, his most recent cardiac catheterization by Dr. Pernell [name] revealed patent LAD and circumflex stents and a left dominant system with an occluded nondominant RCA.    Review of Systems:     Cardiac Review of Systems: {Y] = yes [ ] = no  Chest Pain [    ]  Resting SOB [   ] Exertional SOB  [  ]  Orthopnea [  ]   Pedal Edema [   ]    Palpitations [  ] Syncope  [  ]   Presyncope [   ]  General Review of Systems: [Y] = yes [  ]=no Constitional: recent weight change [  ]; anorexia [  ]; fatigue [  ]; nausea [  ]; night sweats [  ]; fever [  ]; or chills [  ];                                                                      Dental: poor dentition[  ];   Eye : blurred vision [  ]; diplopia [   ]; vision changes [  ];  Amaurosis fugax[  ]; Resp: cough [  ];  wheezing[  ];  hemoptysis[  ]; shortness of breath[  ]; paroxysmal nocturnal dyspnea[  ]; dyspnea on exertion[  ]; or orthopnea[  ];  GI:  gallstones[  ], vomiting[  ];  dysphagia[  ]; melena[  ];  hematochezia [  ]; heartburn[  ];   GU: kidney stones [  ]; hematuria[  ];   dysuria [  ];  nocturia[  ];               Skin: rash [  ], swelling[  ];, hair loss[  ];  peripheral edema[  ];  or itching[  ]; Musculosketetal: myalgias[  ];  joint  swelling[  ];  joint erythema[  ];  joint pain[  ];  back pain[  ];  Heme/Lymph: bruising[  ];  bleeding[  ];  anemia[  ];  Neuro: TIA[  ];  headaches[  ];  stroke[  ];  vertigo[  ];  seizures[  ];   paresthesias[  ];  difficulty walking[  ];  Psych:depression[  ]; anxiety[  ];  Endocrine: diabetes[  ];  thyroid dysfunction[  ];  Other:  Past Medical History  Diagnosis Date  . Melanoma ([place]) 10/11  left foot  . Cellulitis 5/11    left leg  . CAD (coronary artery disease)   . HTN (hypertension)   . Diabetes mellitus   . Obesity   . Seasonal allergies   . S/P colonoscopy 07/03/03     Dr Kevin [name]->hyperplastic rectal polyp  . Diverticulosis   . Sleep apnea   . Microscopic colitis 10/09/10    Colonoscopy dr Kevin [name]  . Diverticulosis   . S/P endoscopy 10/09/10    lap band intact, duodenal erosions  . Melanoma of foot ([facility]) 06/02/2011  . Hyperlipidemia   . OSA on CPAP 01/20/2014    No current facility-administered medications on file prior to encounter.   Current Outpatient Prescriptions on File Prior to Encounter  Medication Sig Dispense Refill  . Ascorbic Acid (VITAMIN C) 1000 MG tablet Take 1,000 mg by mouth daily.    . aspirin 81 MG tablet Take 81 mg by mouth daily.    . B Complex Vitamins (VITAMIN B COMPLEX PO) Take 1 tablet by mouth daily.    . clopidogrel (PLAVIX) 75 MG tablet TAKE ONE TABLET BY MOUTH ONCE DAILY. 30 tablet 10  . fluticasone (FLONASE) 50 MCG/ACT nasal spray Place 2 sprays into both nostrils daily.    . furosemide (LASIX) 40 MG tablet Take 40 mg by mouth daily.    . metFORMIN (GLUCOPHAGE) 1000 MG tablet Take 500 mg by mouth daily.     . metoprolol (TOPROL-XL) 100 MG 24 hr tablet Take 100 mg by mouth daily.      . niacin (NIASPAN) 1000 MG CR tablet TAKE 1 TABLET BY MOUTH AT BEDTIME. 30 tablet 7  . NITROSTAT 0.4 MG SL tablet DISSOLVE 1 TABLET UNDER TONGUE EVERY 5 MINUTES UP TO 15 MIN FOR CHESTPAIN. IF NO RELIEF CALL 911. 25 tablet 3  . pravastatin (PRAVACHOL) 40 MG  tablet Take 40 mg by mouth daily.    . valsartan-hydrochlorothiazide (DIOVAN-HCT) 160-12.5 MG per tablet Take 1 tablet by mouth daily.          Allergies  Allergen Reactions  . Altace [Ramipril] Shortness Of Breath  . Avapro [Irbesartan] Shortness Of Breath  . Cephalexin Hives and Swelling  . Clindamycin Hcl Hives and Swelling  . Codeine Nausea And Vomiting and Other (See Comments)    AFFECTS HEART  . Fenofibrate Other (See Comments)    "My skin started peeling off"  . Glycotrol [Aminobrain] Other (See Comments)    unknown  . Indomethacin Other (See Comments)    UNKNOWN REACTION  . Minocycline Hcl Hives and Swelling  . Morphine And Related Nausea And Vomiting    PROJECTILE VOMITING  . Other Other (See Comments)    Contrast dye  . Penicillins     Social History   Social History  . Marital Status: Divorced    Spouse Name: N/A  . Number of Children: 1  . Years of Education: N/A   Occupational History  . surveyor     [employer]   Social History Main Topics  . Smoking status: Former Smoker -- 3.00 packs/day for 30 years    Types: Cigarettes    Start date: 11/18/1964    Quit date: 10/04/1994  . Smokeless tobacco: Never Used     Comment: quit smoking 19 years ago  . Alcohol Use: Yes     Comment: 3-4 times/year  . Drug Use: No  . Sexual Activity: Not Currently   Other Topics Concern  . Not on file   Social History Narrative      Family History  Problem Relation Age of Onset  . Liver cancer Brother 64  . Celiac disease Other 36    brother's daughter  . Coronary artery disease Mother   . Diabetes Father     PHYSICAL EXAM: Filed Vitals:   02/14/15 0230  BP: 138/86  Pulse: 64  Temp: 98.6 F (37 C)  Resp: 18   General:  Obese, well appearing. No respiratory difficulty HEENT: normal Neck: supple. no JVD. Carotids 2+ bilat; no bruits. No lymphadenopathy or thryomegaly appreciated. Cor: PMI nondisplaced. Regular rate & rhythm. No rubs, gallops or  murmurs. Lungs: clear Abdomen: obese, soft, nontender, nondistended. No hepatosplenomegaly. No bruits or masses. Good bowel sounds. Extremities: no cyanosis, clubbing, rash, +1 edema Neuro: alert & oriented x 3, cranial nerves grossly intact. moves all 4 extremities w/o difficulty. Affect pleasant.  ECG:  SR with nonspecific T wave changes that are new when compared to previous.  Results for orders placed or performed during the hospital encounter of 02/13/15 (from the past 24 hour(s))  CBC with Differential     Status: Abnormal   Collection Time: 02/13/15  7:34 PM  Result Value Ref Range   WBC 5.3 4.0 - 10.5 K/uL   RBC 4.05 (L) 4.22 - 5.81 MIL/uL   Hemoglobin 13.4 13.0 - 17.0 g/dL   HCT 39.0 39.0 - 52.0 %   MCV 96.3 78.0 - 100.0 fL   MCH 33.1 26.0 - 34.0 pg   MCHC 34.4 30.0 - 36.0 g/dL   RDW 13.0 11.5 - 15.5 %   Platelets 192 150 - 400 K/uL   Neutrophils Relative % 47 %   Neutro Abs 2.5 1.7 - 7.7 K/uL   Lymphocytes Relative 39 %   Lymphs Abs 2.1 0.7 - 4.0 K/uL   Monocytes Relative 10 %   Monocytes Absolute 0.5 0.1 - 1.0 K/uL   Eosinophils Relative 3 %   Eosinophils Absolute 0.2 0.0 - 0.7 K/uL   Basophils Relative 1 %   Basophils Absolute 0.1 0.0 - 0.1 K/uL  Troponin I     Status: Abnormal   Collection Time: 02/13/15  7:34 PM  Result Value Ref Range   Troponin I 0.08 (H) <0.031 ng/mL  Comprehensive metabolic panel     Status: Abnormal   Collection Time: 02/13/15  7:34 PM  Result Value Ref Range   Sodium 139 135 - 145 mmol/L   Potassium 3.8 3.5 - 5.1 mmol/L   Chloride 105 101 - 111 mmol/L   CO2 27 22 - 32 mmol/L   Glucose, Bld 107 (H) 65 - 99 mg/dL   BUN 34 (H) 6 - 20 mg/dL   Creatinine, Ser 1.37 (H) 0.61 - 1.24 mg/dL   Calcium 9.5 8.9 - 10.3 mg/dL   Total Protein 7.0 6.5 - 8.1 g/dL   Albumin 4.5 3.5 - 5.0 g/dL   AST 18 15 - 41 U/L   ALT 19 17 - 63 U/L   Alkaline Phosphatase 31 (L) 38 - 126 U/L   Total Bilirubin 0.5 0.3 - 1.2 mg/dL   GFR calc non Af Amer 51 (L) >60  mL/min   GFR calc Af Amer 60 (L) >60 mL/min   Anion gap 7 5 - 15  Lipase, blood     Status: None   Collection Time: 02/13/15  7:34 PM  Result Value Ref Range   Lipase 44 22 - 51 U/L  Protime-INR     Status: None   Collection Time: 02/13/15  7:34 PM    Result Value Ref Range   Prothrombin Time 13.7 11.6 - 15.2 seconds   INR 1.03 0.00 - 1.49  I-stat troponin, ED     Status: None   Collection Time: 02/13/15  7:53 PM  Result Value Ref Range   Troponin i, poc 0.08 0.00 - 0.08 ng/mL   Comment 3           Dg Chest Portable 1 View  02/13/2015  CLINICAL DATA:  69 year old male with chest pain and shortness of breath today. Pain in the left shoulder, jaw and back. Initial encounter. EXAM: PORTABLE CHEST 1 VIEW COMPARISON:  08/29/2013 and earlier. FINDINGS: Portable AP upright view at 1933 hours. Mediastinal contours are stable since 2015 allowing for portable technique. Stable lung volumes. Allowing for portable technique, the lungs are clear. No pneumothorax or pleural effusion. Visualized tracheal air column is within normal limits. Increased bowel gas in the left upper quadrant. IMPRESSION: No acute cardiopulmonary abnormality. Electronically Signed   By: Genevie Ann M.D.   On: 02/13/2015 19:44     ASSESSMENT: 69 yo man with HTN, HLD, DM and known CAD s/p PCI to LAD and Cx as well as most recent cath showing patent stents with occluded, non-dominant RCA who presents with CP and mildly elevated troponin concerning for ACS.  PLAN/DISCUSSION: Admit to tele ASA, heparin Cycle troponins Risk stratify with lipid, A1c, check TSH Continue statin and BB NPO  Await further troponin for decision on invasive vs non-invasive ischemic evaluation

## 2015-02-14 NOTE — Care Management Note (Signed)
Case Management Note  Patient Details  Name: YASUO PHIMMASONE MRN: [id number] Date of Birth: 02/15/1946  Subjective/Objective:    Pt admitted for chest pain. Plan is for cardiac cath 02-14-15. Initiated on IV heparin gtt.             Action/Plan: Pt independent prior to d/c. CM will continue to monitor for any disposition needs.    Expected Discharge Date:                  Expected Discharge Plan:  Home/Self Care  In-House Referral:  NA  Discharge planning Services  CM Consult  Post Acute Care Choice:    Choice offered to:     DME Arranged:    DME Agency:     HH Arranged:    HH Agency:     Status of Service:  In process, will continue to follow  Medicare Important Message Given:    Date Medicare IM Given:    Medicare IM give by:    Date Additional Medicare IM Given:    Additional Medicare Important Message give by:     If discussed at [place] of Stay Meetings, dates discussed:    Additional Comments:  Bethena Roys, RN 02/14/2015, 11:37 AM

## 2015-02-14 NOTE — Interval H&P Note (Signed)
Cath Lab Visit (complete for each Cath Lab visit)  Clinical Evaluation Leading to the Procedure:   ACS: Yes.    Non-ACS:    Anginal Classification: CCS IV  Anti-ischemic medical therapy: Maximal Therapy (2 or more classes of medications)  Non-Invasive Test Results: No non-invasive testing performed  Prior CABG: No previous CABG      History and Physical Interval Note:  02/14/2015 2:10 PM  Kevin [name]  has presented today for surgery, with the diagnosis of non stemi  The various methods of treatment have been discussed with the patient and family. After consideration of risks, benefits and other options for treatment, the patient has consented to  Procedure(s): Left Heart Cath and Coronary Angiography (N/A) as a surgical intervention .  The patient's history has been reviewed, patient examined, no change in status, stable for surgery.  I have reviewed the patient's chart and labs.  Questions were answered to the patient's satisfaction.     VARANASI,JAYADEEP S.

## 2015-02-14 NOTE — H&P (View-Only) (Signed)
Patient Name: KIJUAN GALLICCHIO Date of Encounter: 02/14/2015  Primary Cardiologist: Dr. [name]   Principal Problem:   Acute coronary syndrome [facility]) Active Problems:   Essential hypertension   Hyperlipidemia   Chest pain    SUBJECTIVE  States he was walking in [place] when he had chest pain, only lasted a few min and improved after resting. 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 Still had intermittent chest discomfort last night only last few min at a time, describe as pain originating from the back radiating up to the chest and L neck. Denies any SOB.   CURRENT MEDS . [START ON 02/15/2015] aspirin EC  81 mg Oral Daily  . hydrochlorothiazide  12.5 mg Oral Daily  . metoprolol succinate  100 mg Oral Daily  . pravastatin  40 mg Oral q1800  . valsartan  160 mg Oral Daily    OBJECTIVE  Filed Vitals:   02/13/15 2330 02/14/15 0000 02/14/15 0230 02/14/15 0515  BP: 104/52 107/64 138/86 112/61  Pulse: 64 62 64 86  Temp:   98.6 F (37 C) 98 F (36.7 C)  TempSrc:   Oral Oral  Resp: 12 16 18 18   Height:   5' 9.5" (1.765 m)   Weight:   272 lb 6.4 oz (123.56 kg) 272 lb 6.4 oz (123.56 kg)  SpO2: 98% 99% 99% 99%   No intake or output data in the 24 hours ending 02/14/15 0735 Filed Weights   02/13/15 1916 02/14/15 0230 02/14/15 0515  Weight: 260 lb (117.935 kg) 272 lb 6.4 oz (123.56 kg) 272 lb 6.4 oz (123.56 kg)    PHYSICAL EXAM  General: Pleasant, NAD. Neuro: Alert and oriented X 3. Moves all extremities spontaneously. Psych: Normal affect. HEENT:  Normal  Neck: Supple without bruits or JVD. Lungs:  Resp regular and unlabored, CTA. Heart: RRR no s3, s4, or murmurs. Abdomen: Soft, non-tender, non-distended, BS + x 4.  Extremities: No clubbing, cyanosis or edema. DP/PT/Radials 2+ and equal bilaterally.  Accessory Clinical Findings  CBC  Recent Labs  02/13/15 1934  WBC 5.3  NEUTROABS 2.5  HGB  13.4  HCT 39.0  MCV 96.3  PLT 762   Basic Metabolic Panel  Recent Labs  02/13/15 1934  NA 139  K 3.8  CL 105  CO2 27  GLUCOSE 107*  BUN 34*  CREATININE 1.37*  CALCIUM 9.5   Liver Function Tests  Recent Labs  02/13/15 1934  AST 18  ALT 19  ALKPHOS 31*  BILITOT 0.5  PROT 7.0  ALBUMIN 4.5    Recent Labs  02/13/15 1934  LIPASE 44   Cardiac Enzymes  Recent Labs  02/13/15 1934 02/14/15 0317  TROPONINI 0.08* 0.21*   Fasting Lipid Panel  Recent Labs  02/14/15 0317  CHOL 165  HDL 35*  LDLCALC 90  TRIG 200*  CHOLHDL 4.7   Thyroid Function Tests  Recent Labs  02/14/15 0317  TSH 2.951    TELE NSR with HR 50-60s    ECG  NSR with TWI in V1-V3, noted V3 TWI is new when compare to 2015  Echocardiogram 10/03/2014  Left ventricle: The cavity size was normal. Systolic function was normal. The estimated ejection fraction was in the range of 60% to 65%. Wall motion was normal; there were no regional wall motion abnormalities. Left ventricular diastolic function parameters were normal. Impressions:  - Increased flow velocities across all valves suggest high  cardiac  output (consider sepsis, anemia, thyrotoxicosis, etc.).    Radiology/Studies  Dg Chest Portable 1 View  02/13/2015  CLINICAL DATA:  69 year old male with chest pain and shortness of breath today. Pain in the left shoulder, jaw and back. Initial encounter. EXAM: PORTABLE CHEST 1 VIEW COMPARISON:  08/29/2013 and earlier. FINDINGS: Portable AP upright view at 1933 hours. Mediastinal contours are stable since 2015 allowing for portable technique. Stable lung volumes. Allowing for portable technique, the lungs are clear. No pneumothorax or pleural effusion. Visualized tracheal air column is within normal limits. Increased bowel gas in the left upper quadrant. IMPRESSION: No acute cardiopulmonary abnormality. Electronically Signed   By: Genevie Ann M.D.   On: 02/13/2015 19:44     ASSESSMENT AND PLAN  Mr Clausing is a 69 year old obese man with CAD s/p PCI in past, HTN, HLD and DM who presented to [facility] ED with CC of CP while walking resolved with rest. He was transferred to [facility] for further evaluation.  1. NSTEMI, symptom concerning for angina  - trop 0.08 --> 0.21. Will discuss with Dr. [name], likely cath today.  - Risk and benefit of procedure explained to the patient who display clear understanding and agree to proceed. Discussed with patient possible procedural risk include bleeding, vascular injury, renal injury, arrythmia, MI, stroke and loss of limb or life.  2. CAD s/p multiple PCI  - s/p remote LAD and LCx in 2001 and 2004 by Dr. Olevia Perches and Dr. Albertine Patricia  - cath 07/15/2013 total occlusion of nondominant RCA (new when compare to cath 2011), widely patent LAD and LCx, stable 90+ percent stenosis of the jailed Diag arising from stented LAD, diffuse 50-70% O1, elevated LVEDP 53mmHg. Continue medical therapy.  3. HTN 4. HLD 5. DM 6. OSA on CPAP 7. CKD stage III. Cr 1.3 yesterday  Signed, Almyra Deforest PA-C Pager: [phone]

## 2015-02-14 NOTE — Progress Notes (Signed)
Patient Name: Kevin [name] Date of Encounter: 02/14/2015  Primary Cardiologist: Dr. [name]   Principal Problem:   Acute coronary syndrome [facility]) Active Problems:   Essential hypertension   Hyperlipidemia   Chest pain    SUBJECTIVE  States he was walking in [place] when he had chest pain, only lasted a few min and improved after resting. 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 Still had intermittent chest discomfort last night only last few min at a time, describe as pain originating from the back radiating up to the chest and L neck. Denies any SOB.   CURRENT MEDS . [START ON 02/15/2015] aspirin EC  81 mg Oral Daily  . hydrochlorothiazide  12.5 mg Oral Daily  . metoprolol succinate  100 mg Oral Daily  . pravastatin  40 mg Oral q1800  . valsartan  160 mg Oral Daily    OBJECTIVE  Filed Vitals:   02/13/15 2330 02/14/15 0000 02/14/15 0230 02/14/15 0515  BP: 104/52 107/64 138/86 112/61  Pulse: 64 62 64 86  Temp:   98.6 F (37 C) 98 F (36.7 C)  TempSrc:   Oral Oral  Resp: 12 16 18 18   Height:   5' 9.5" (1.765 m)   Weight:   272 lb 6.4 oz (123.56 kg) 272 lb 6.4 oz (123.56 kg)  SpO2: 98% 99% 99% 99%   No intake or output data in the 24 hours ending 02/14/15 0735 Filed Weights   02/13/15 1916 02/14/15 0230 02/14/15 0515  Weight: 260 lb (117.935 kg) 272 lb 6.4 oz (123.56 kg) 272 lb 6.4 oz (123.56 kg)    PHYSICAL EXAM  General: Pleasant, NAD. Neuro: Alert and oriented X 3. Moves all extremities spontaneously. Psych: Normal affect. HEENT:  Normal  Neck: Supple without bruits or JVD. Lungs:  Resp regular and unlabored, CTA. Heart: RRR no s3, s4, or murmurs. Abdomen: Soft, non-tender, non-distended, BS + x 4.  Extremities: No clubbing, cyanosis or edema. DP/PT/Radials 2+ and equal bilaterally.  Accessory Clinical Findings  CBC  Recent Labs  02/13/15 1934  WBC 5.3  NEUTROABS 2.5  HGB  13.4  HCT 39.0  MCV 96.3  PLT 016   Basic Metabolic Panel  Recent Labs  02/13/15 1934  NA 139  K 3.8  CL 105  CO2 27  GLUCOSE 107*  BUN 34*  CREATININE 1.37*  CALCIUM 9.5   Liver Function Tests  Recent Labs  02/13/15 1934  AST 18  ALT 19  ALKPHOS 31*  BILITOT 0.5  PROT 7.0  ALBUMIN 4.5    Recent Labs  02/13/15 1934  LIPASE 44   Cardiac Enzymes  Recent Labs  02/13/15 1934 02/14/15 0317  TROPONINI 0.08* 0.21*   Fasting Lipid Panel  Recent Labs  02/14/15 0317  CHOL 165  HDL 35*  LDLCALC 90  TRIG 200*  CHOLHDL 4.7   Thyroid Function Tests  Recent Labs  02/14/15 0317  TSH 2.951    TELE NSR with HR 50-60s    ECG  NSR with TWI in V1-V3, noted V3 TWI is new when compare to 2015  Echocardiogram 10/03/2014  Left ventricle: The cavity size was normal. Systolic function was normal. The estimated ejection fraction was in the range of 60% to 65%. Wall motion was normal; there were no regional wall motion abnormalities. Left ventricular diastolic function parameters were normal. Impressions:  - Increased flow velocities across all valves suggest high  cardiac  output (consider sepsis, anemia, thyrotoxicosis, etc.).    Radiology/Studies  Dg Chest Portable 1 View  02/13/2015  CLINICAL DATA:  69 year old male with chest pain and shortness of breath today. Pain in the left shoulder, jaw and back. Initial encounter. EXAM: PORTABLE CHEST 1 VIEW COMPARISON:  08/29/2013 and earlier. FINDINGS: Portable AP upright view at 1933 hours. Mediastinal contours are stable since 2015 allowing for portable technique. Stable lung volumes. Allowing for portable technique, the lungs are clear. No pneumothorax or pleural effusion. Visualized tracheal air column is within normal limits. Increased bowel gas in the left upper quadrant. IMPRESSION: No acute cardiopulmonary abnormality. Electronically Signed   By: Genevie Ann M.D.   On: 02/13/2015 19:44     ASSESSMENT AND PLAN  [name] is a 69 year old obese man with CAD s/p PCI in past, HTN, HLD and DM who presented to [facility] ED with CC of CP while walking resolved with rest. He was transferred to [facility] for further evaluation.  1. NSTEMI, symptom concerning for angina  - trop 0.08 --> 0.21. Will discuss with Dr. [name], likely cath today.  - Risk and benefit of procedure explained to the patient who display clear understanding and agree to proceed. Discussed with patient possible procedural risk include bleeding, vascular injury, renal injury, arrythmia, MI, stroke and loss of limb or life.  2. CAD s/p multiple PCI  - s/p remote LAD and LCx in 2001 and 2004 by Dr. Olevia Perches and Dr. Albertine Patricia  - cath 07/15/2013 total occlusion of nondominant RCA (new when compare to cath 2011), widely patent LAD and LCx, stable 90+ percent stenosis of the jailed Diag arising from stented LAD, diffuse 50-70% O1, elevated LVEDP 82mmHg. Continue medical therapy.  3. HTN 4. HLD 5. DM 6. OSA on CPAP 7. CKD stage III. Cr 1.3 yesterday  Signed, Almyra Deforest PA-C Pager: [phone]

## 2015-02-14 NOTE — Progress Notes (Signed)
ANTICOAGULATION CONSULT NOTE - Initial Consult  Pharmacy Consult for heparin Indication: chest pain/ACS  Allergies  Allergen Reactions  . Altace [Ramipril] Shortness Of Breath  . Avapro [Irbesartan] Shortness Of Breath  . Cephalexin Hives and Swelling  . Clindamycin Hcl Hives and Swelling  . Codeine Nausea And Vomiting and Other (See Comments)    AFFECTS HEART  . Fenofibrate Other (See Comments)    "My skin started peeling off"  . Glycotrol [Aminobrain] Other (See Comments)    unknown  . Indomethacin Other (See Comments)    UNKNOWN REACTION  . Minocycline Hcl Hives and Swelling  . Morphine And Related Nausea And Vomiting    PROJECTILE VOMITING  . Other Other (See Comments)    Contrast dye  . Penicillins     Has patient had a PCN reaction causing immediate rash, facial/tongue/throat swelling, SOB or lightheadedness with hypotension:  Has patient had a PCN reaction causing severe rash involving mucus membranes or skin necrosis:  Has patient had a PCN reaction that required hospitalization  Has patient had a PCN reaction occurring within the last 10 years:  If all of the above answers are "NO", then may proceed with Cephalosporin use.   . Prednisone Swelling and Other (See Comments)    SWELLING OF THE LIPS-NOT SURE OF THIS ALLERGY  . Septra [Bactrim] Hives and Swelling  . Shellfish Allergy Other (See Comments)    Unknown   . Sulfa Drugs Cross Reactors Hives and Swelling  . Tetracyclines & Related   . Tradjenta [Linagliptin]     Shortness of breath    Patient Measurements: Height: 5' 9.5" (176.5 cm) Weight: 272 lb 6.4 oz (123.56 kg) IBW/kg (Calculated) : 71.85  Heparin dosing weight: 100kg  Vital Signs: Temp: 98.6 F (37 C) (10/12 0230) Temp Source: Oral (10/12 0230) BP: 138/86 mmHg (10/12 0230) Pulse Rate: 64 (10/12 0230)  Labs:  Recent Labs  02/13/15 1934  HGB 13.4  HCT 39.0  PLT 192  LABPROT 13.7  INR 1.03  CREATININE 1.37*  TROPONINI 0.08*     Estimated Creatinine Clearance: 67.6 mL/min (by C-G formula based on Cr of 1.37).   Medical History: Past Medical History  Diagnosis Date  . Melanoma ([place]) 10/11    left foot  . Cellulitis 5/11    left leg  . CAD (coronary artery disease)   . HTN (hypertension)   . Diabetes mellitus   . Obesity   . Seasonal allergies   . S/P colonoscopy 07/03/03     Dr [name]->hyperplastic rectal polyp  . Diverticulosis   . Sleep apnea   . Microscopic colitis 10/09/10    Colonoscopy dr [name]  . Diverticulosis   . S/P endoscopy 10/09/10    lap band intact, duodenal erosions  . Melanoma of foot ([place]) 06/02/2011  . Hyperlipidemia   . OSA on CPAP 01/20/2014    Medications:  Prescriptions prior to admission  Medication Sig Dispense Refill Last Dose  . Ascorbic Acid (VITAMIN C) 1000 MG tablet Take 1,000 mg by mouth daily.   02/13/2015 at Unknown time  . aspirin 81 MG tablet Take 81 mg by mouth daily.   02/13/2015 at Unknown time  . B Complex Vitamins (VITAMIN B COMPLEX PO) Take 1 tablet by mouth daily.   02/13/2015 at Unknown time  . clopidogrel (PLAVIX) 75 MG tablet TAKE ONE TABLET BY MOUTH ONCE DAILY. 30 tablet 10 02/13/2015 at Unknown time  . Coenzyme Q10 (CO Q 10) 10 MG CAPS Take 1 capsule by  mouth daily.   02/13/2015 at Unknown time  . fluticasone (FLONASE) 50 MCG/ACT nasal spray Place 2 sprays into both nostrils daily.   02/13/2015 at Unknown time  . furosemide (LASIX) 40 MG tablet Take 40 mg by mouth daily.   02/13/2015 at Unknown time  . metFORMIN (GLUCOPHAGE) 1000 MG tablet Take 500 mg by mouth daily.    02/13/2015 at Unknown time  . metoprolol (TOPROL-XL) 100 MG 24 hr tablet Take 100 mg by mouth daily.     02/13/2015 at 700A  . niacin (NIASPAN) 1000 MG CR tablet TAKE 1 TABLET BY MOUTH AT BEDTIME. 30 tablet 7 02/12/2015 at Unknown time  . NITROSTAT 0.4 MG SL tablet DISSOLVE 1 TABLET UNDER TONGUE EVERY 5 MINUTES UP TO 15 MIN FOR CHESTPAIN. IF NO RELIEF CALL 911. 25 tablet 3 02/13/2015 at  Unknown time  . pravastatin (PRAVACHOL) 40 MG tablet Take 40 mg by mouth daily.   02/12/2015 at Unknown time  . RESVERATROL PO Take 1 capsule by mouth daily.   02/13/2015 at Unknown time  . valsartan-hydrochlorothiazide (DIOVAN-HCT) 160-12.5 MG per tablet Take 1 tablet by mouth daily.     02/13/2015 at Unknown time   Scheduled:  . [START ON 02/15/2015] aspirin EC  81 mg Oral Daily  . hydrochlorothiazide  12.5 mg Oral Daily  . metoprolol succinate  100 mg Oral Daily  . pravastatin  40 mg Oral q1800  . valsartan  160 mg Oral Daily    Assessment: 69yo c/o SOB and CP radiating to left shoulder, jaw, and back, initial troponin elevated, to begin heparin.  Goal of Therapy:  Heparin level 0.3-0.7 units/ml Monitor platelets by anticoagulation protocol: Yes   Plan:  Will give heparin 4000 units IV bolus x1 followed by gtt at 1400 units/hr and monitor heparin levels and CBC.  Wynona Neat, PharmD, BCPS  02/14/2015,3:11 AM

## 2015-02-14 NOTE — Progress Notes (Signed)
UR Completed Ashla Murph Graves-Bigelow, RN,BSN [phone]  

## 2015-02-14 NOTE — Progress Notes (Signed)
ANTICOAGULATION CONSULT NOTE - Follow-up Consult  Pharmacy Consult for heparin Indication: chest pain/ACS  Patient Measurements: Height: 5' 9.5" (176.5 cm) Weight: 267 lb 10.2 oz (121.4 kg) IBW/kg (Calculated) : 71.85  Heparin dosing weight: 100kg  Vital Signs: Temp: 98 F (36.7 C) (10/12 1055) Temp Source: Oral (10/12 1055) BP: 120/67 mmHg (10/12 1055) Pulse Rate: 68 (10/12 1055)  Labs:  Recent Labs  02/13/15 1934 02/14/15 0317 02/14/15 0915  HGB 13.4  --  14.0  HCT 39.0  --  41.1  PLT 192  --  188  LABPROT 13.7  --   --   INR 1.03  --   --   HEPARINUNFRC  --   --  0.25*  CREATININE 1.37*  --  1.35*  TROPONINI 0.08* 0.21* 0.18*    Estimated Creatinine Clearance: 67.9 mL/min (by C-G formula based on Cr of 1.35).  Assessment: 69 yo M on heparin for NSTEMI. Heparin level 0.25 (subtherapeutic) on 1400 units/hr. Plan for cath today. CBC stable. No issues with line or bleeding reported per RN.  Goal of Therapy:  Heparin level 0.3-0.7 units/ml Monitor platelets by anticoagulation protocol: Yes   Plan:  Increase heparin gtt to 1600 units/hr F/u post cath  Sherlon Handing, PharmD, BCPS Clinical pharmacist, pager [phone] 02/14/2015,1:33 PM

## 2015-02-15 ENCOUNTER — Encounter (HOSPITAL_COMMUNITY): Payer: Self-pay | Admitting: Interventional Cardiology

## 2015-02-15 ENCOUNTER — Other Ambulatory Visit: Payer: Self-pay

## 2015-02-15 ENCOUNTER — Telehealth: Payer: Self-pay | Admitting: Cardiovascular Disease

## 2015-02-15 LAB — GLUCOSE, CAPILLARY: GLUCOSE-CAPILLARY: 133 mg/dL — AB (ref 65–99)

## 2015-02-15 LAB — BASIC METABOLIC PANEL
ANION GAP: 11 (ref 5–15)
BUN: 25 mg/dL — ABNORMAL HIGH (ref 6–20)
CALCIUM: 9.4 mg/dL (ref 8.9–10.3)
CO2: 25 mmol/L (ref 22–32)
Chloride: 104 mmol/L (ref 101–111)
Creatinine, Ser: 1.26 mg/dL — ABNORMAL HIGH (ref 0.61–1.24)
GFR calc Af Amer: >60 mL/min (ref >60)
GFR, EST NON AFRICAN AMERICAN: 57 mL/min — AB (ref >60)
GLUCOSE: 162 mg/dL — AB (ref 65–99)
Potassium: 4.1 mmol/L (ref 3.5–5.1)
SODIUM: 140 mmol/L (ref 135–145)

## 2015-02-15 LAB — CBC
HCT: 37.2 % — ABNORMAL LOW (ref 39.0–52.0)
HEMOGLOBIN: 12.6 g/dL — AB (ref 13.0–17.0)
MCH: 32.2 pg (ref 26.0–34.0)
MCHC: 33.9 g/dL (ref 30.0–36.0)
MCV: 95.1 fL (ref 78.0–100.0)
Platelets: 166 10*3/uL (ref 150–400)
RBC: 3.91 MIL/uL — ABNORMAL LOW (ref 4.22–5.81)
RDW: 12.9 % (ref 11.5–15.5)
WBC: 5.7 10*3/uL (ref 4.0–10.5)

## 2015-02-15 LAB — HEMOGLOBIN A1C
HEMOGLOBIN A1C: 6.4 % — AB (ref 4.8–5.6)
MEAN PLASMA GLUCOSE: 137 mg/dL

## 2015-02-15 LAB — TROPONIN I
TROPONIN I: 0.11 ng/mL — AB (ref <0.031)
TROPONIN I: 0.08 ng/mL — AB (ref <0.031)
Troponin I: 0.12 ng/mL — ABNORMAL HIGH (ref <0.031)

## 2015-02-15 NOTE — Progress Notes (Signed)
   The patient has had no recurrence of angina since LAD DES yesterday  The right radial cath site is unremarkable.  Kidney function is improved with creatinine of 1.26  Plan ambulate and discharge this morning. Continue aspirin and Plavix.  1-2 week follow-up with primary cardiologist.

## 2015-02-15 NOTE — Discharge Instructions (Signed)
No driving for 24 hours. No lifting over 5 lbs for 1 week. No sexual activity for 1 week. Keep procedure site clean & dry. If you notice increased pain, swelling, bleeding or pus, call/return!  You may shower, but no soaking baths/hot tubs/pools for 1 week.  ° ° °

## 2015-02-15 NOTE — Discharge Summary (Signed)
Discharge Summary   Patient ID: Kevin [name],  MRN: [id number], DOB/AGE: 1945-06-22 69 y.o.  Admit date: 02/13/2015 Discharge date: 02/15/2015  Primary Care Provider: Sharilyn Sites CABOT Primary Cardiologist: Dr. [name]  Discharge Diagnoses Principal Problem:   Acute coronary syndrome [facility]) Active Problems:   Essential hypertension   Hyperlipidemia   Chest pain   NSTEMI (non-ST elevated myocardial infarction) ([place])   Allergies Allergies  Allergen Reactions  . Altace [Ramipril] Shortness Of Breath  . Avapro [Irbesartan] Shortness Of Breath  . Cephalexin Hives and Swelling  . Clindamycin Hcl Hives and Swelling  . Codeine Nausea And Vomiting and Other (See Comments)    AFFECTS HEART  . Fenofibrate Other (See Comments)    "My skin started peeling off"  . Glycotrol [Aminobrain] Other (See Comments)    unknown  . Indomethacin Other (See Comments)    UNKNOWN REACTION  . Minocycline Hcl Hives and Swelling  . Morphine And Related Nausea And Vomiting    PROJECTILE VOMITING  . Other Other (See Comments)    Contrast dye  . Penicillins     Has patient had a PCN reaction causing immediate rash, facial/tongue/throat swelling, SOB or lightheadedness with hypotension:  Has patient had a PCN reaction causing severe rash involving mucus membranes or skin necrosis:  Has patient had a PCN reaction that required hospitalization  Has patient had a PCN reaction occurring within the last 10 years:  If all of the above answers are "NO", then may proceed with Cephalosporin use.   . Prednisone Swelling and Other (See Comments)    SWELLING OF THE LIPS-NOT SURE OF THIS ALLERGY  . Septra [Bactrim] Hives and Swelling  . Shellfish Allergy Other (See Comments)    Unknown   . Sulfa Drugs Cross Reactors Hives and Swelling  . Tetracyclines & Related   . Tradjenta [Linagliptin]     Shortness of breath    Procedures  Cardiac catheterization 02/14/2015 Conclusion     Non-dominant Prox RCA  lesion, 100% stenosed. Unchanged from prior.  Ost 2nd Diag lesion, jailed by the prior stent, 80% stenosed. It increased somewhat in severity but TIMI-3 flow was maintained. It was easily wired even after the stent was placed.  Mid LAD-1 lesion, 99% stenosed, focal in-stent restenosis. There is a 95% de novo lesion just past the distal edge of the stent. The entire area was treated with a 3.5 x 28 Synergy drug-eluting stent, postdilated to 3.8 mm. Post intervention, there is a 0% residual stenosis.  Continue dual antiplatelet therapy for at least a year without interruption. The patient did rule in for a non-STEMI. He had already been taking clopidogrel so I did not change this. It would not be unreasonable to look at a more potent antiplatelet inhibitor such as Brilinta if he can handle this from a bleeding risk standpoint.  Continue aggressive secondary prevention.      Hospital Course  The patient is a 69 year old male with past medical history of CAD s/p PCI in past, HTN, HLD and DM who presented to [facility] ED with CC of CP. Past medical history include remote LAD and LCx in 2001 and 2004 by Dr. Olevia Perches and Dr. Albertine Patricia. He had cath on /13/2015 total occlusion of nondominant RCA (new when compare to cath 2011), widely patent LAD and LCx, stable 90+ percent stenosis of the jailed Diag arising from stented LAD, diffuse 50-70% O1, elevated LVEDP 53mHg. Medical therapy was recommended.   For the past 2-3 weeks, he has been experiencing  a few episodes of chest pain as very brief in duration. On the day of arrival, he was walking in [place] when he started having chest discomfort, it eventually went away with rest. The degree of chest pain was more in intensity and radiated to the jaw. He later met with a friend to have coffee. After put some further thought to the event, he decided to seek medical attention. On arrival, his troponin was mildly elevated at 0.08. He was transferred to [facility] for  further evaluation. After arriving at [facility], his troponin trended up to 0.21. We have discussed various options with the patient, he agreed to undergo diagnostic cardiac catheterization. Cardiac catheterization was performed on 02/14/2015 which showed 100% nondominant proximal RCA occlusion unchanged from prior cath, 80% ostial D2 lesion gel by prior stent with TIMI-3 flow, 99% mid LAD focal in-stent restenosis and a 95% and no wall lesion just past the distal edge of the stent, the area was treated with 3.5 x 28 mm Synergy DES postdilated to 3.8 mm. He was seen on the following morning, at which time the troponin is trending down. His renal function is stable after cath. He is deemed stable for discharge from cardiology perspective. I have arranged close outpatient follow-up in one week. Patient has been instructed on the importance of compliance with aspirin and Plavix.   Discharge Vitals Blood pressure 148/66, pulse 86, temperature 97.7 F (36.5 C), temperature source Oral, resp. rate 15, height 5' 9.5" (1.765 m), weight 265 lb (120.203 kg), SpO2 100 %.  Filed Weights   02/14/15 0515 02/14/15 1027 02/15/15 0342  Weight: 272 lb 6.4 oz (123.56 kg) 267 lb 10.2 oz (121.4 kg) 265 lb (120.203 kg)    Labs  CBC  Recent Labs  02/13/15 1934  02/14/15 1716 02/15/15 0234  WBC 5.3  < > 5.4 5.7  NEUTROABS 2.5  --   --   --   HGB 13.4  < > 13.4 12.6*  HCT 39.0  < > 39.8 37.2*  MCV 96.3  < > 95.4 95.1  PLT 192  < > 174 166  < > = values in this interval not displayed. Basic Metabolic Panel  Recent Labs  02/14/15 0915 02/14/15 1716 02/15/15 0234  NA 140  --  140  K 4.7  --  4.1  CL 103  --  104  CO2 28  --  25  GLUCOSE 140*  --  162*  BUN 23*  --  25*  CREATININE 1.35* 1.44* 1.26*  CALCIUM 9.8  --  9.4   Liver Function Tests  Recent Labs  02/13/15 1934  AST 18  ALT 19  ALKPHOS 31*  BILITOT 0.5  PROT 7.0  ALBUMIN 4.5    Recent Labs  02/13/15 1934  LIPASE 44     Cardiac Enzymes  Recent Labs  02/15/15 0020 02/15/15 0234 02/15/15 0850  TROPONINI 0.12* 0.11* 0.08*   Hemoglobin A1C  Recent Labs  02/14/15 0317  HGBA1C 6.4*   Fasting Lipid Panel  Recent Labs  02/14/15 0317  CHOL 165  HDL 35*  LDLCALC 90  TRIG 200*  CHOLHDL 4.7   Thyroid Function Tests  Recent Labs  02/14/15 0317  TSH 2.951    Disposition  Pt is being discharged home today in good condition.  Follow-up Plans & Appointments      Follow-up Information    Follow up with HAGER, BRYAN, PA-C On 02/22/2015.   Specialties:  Physician Assistant, Radiology,  Interventional Cardiology   Why:  8:30am   Contact information:   [place] [id number] [phone]       Discharge Medications    Medication List    TAKE these medications        aspirin 81 MG tablet  Take 81 mg by mouth daily.     clopidogrel 75 MG tablet  Commonly known as:  PLAVIX  TAKE ONE TABLET BY MOUTH ONCE DAILY.     Co Q 10 10 MG Caps  Take 1 capsule by mouth daily.     fluticasone 50 MCG/ACT nasal spray  Commonly known as:  FLONASE  Place 2 sprays into both nostrils daily.     furosemide 40 MG tablet  Commonly known as:  LASIX  Take 40 mg by mouth daily.     metFORMIN 1000 MG tablet  Commonly known as:  GLUCOPHAGE  Take 500 mg by mouth daily.     metoprolol succinate 100 MG 24 hr tablet  Commonly known as:  TOPROL-XL  Take 100 mg by mouth daily.     niacin 1000 MG CR tablet  Commonly known as:  NIASPAN  TAKE 1 TABLET BY MOUTH AT BEDTIME.     NITROSTAT 0.4 MG SL tablet  Generic drug:  nitroGLYCERIN  DISSOLVE 1 TABLET UNDER TONGUE EVERY 5 MINUTES UP TO 15 MIN FOR CHESTPAIN. IF NO RELIEF CALL 911.     PRAVACHOL 40 MG tablet  Generic drug:  pravastatin  Take 40 mg by mouth daily.     RESVERATROL PO  Take 1 capsule by mouth daily.     valsartan-hydrochlorothiazide 160-12.5 MG tablet  Commonly known as:  DIOVAN-HCT  Take 1 tablet by  mouth daily.     VITAMIN B COMPLEX PO  Take 1 tablet by mouth daily.     vitamin C 1000 MG tablet  Take 1,000 mg by mouth daily.        Duration of Discharge Encounter   Greater than 30 minutes including physician time.  Hilbert Corrigan PA-C Pager: [phone] 02/15/2015, 10:02 AM

## 2015-02-15 NOTE — Progress Notes (Signed)
TR BAND REMOVAL  LOCATION:    right radial  DEFLATED PER PROTOCOL:    Yes.    TIME BAND OFF / DRESSING APPLIED:    2100   SITE UPON ARRIVAL:    Level 0  SITE AFTER BAND REMOVAL:    Level 0  REVERSE ALLEN'S TEST:     positive  CIRCULATION SENSATION AND MOVEMENT:    Within Normal Limits   Yes.    COMMENTS:    

## 2015-02-15 NOTE — Telephone Encounter (Signed)
TCM   Per Isaac Laud   With Tenny Craw 10/20 @ 8:30a

## 2015-02-15 NOTE — Progress Notes (Signed)
CARDIAC REHAB PHASE I   PRE:  Rate/Rhythm: 72 SR  BP:  Sitting: 134/46        SaO2: 100 RA  MODE:  Ambulation: 500 ft   POST:  Rate/Rhythm: 100 SR  BP:  Sitting: 148/66         SaO2: 100 RA  Pt ambulated 500 ft on RA, independent, steady gait, tolerated well.  Pt c/o of mild DOE, denies cp, dizziness, declined rest stop. Completed MI/stent education. Reviewed risk factors, MI book, anti-platelet therapy, stent card, activity restrictions, ntg, exercise, heart healthy diet, carb counting, and phase 2 cardiac rehab. Pt verbalized understanding. Pt agrees to phase 2 cardiac rehab referral, will send to [facility]. Pt receptive to education, states he has worked very hard to make lifestyle changes and has lost a significant amount of weight, but he does eat out often. Pt states he hopes to increase his activity level and will continue to work on diet modification. Pt to recliner after walk, call bell within reach.    Lenna Sciara, RN, BSN 02/15/2015 9:40 AM

## 2015-02-16 NOTE — Telephone Encounter (Signed)
Patient contacted regarding discharge from [facility] on 02/15/15.  Patient understands to follow up with provider Tarri Fuller, PA on 02/22/15 at 0830 at [facility]. Patient understands discharge instructions? Yes Patient understands medications and regiment? Yes Patient understands to bring all medications to this visit? Yes  Patient is requesting records from hospital visit. Informed him he will need to contact [facility] medical records department for this. He states he has called and left a VM. Advised he try to reach them again.

## 2015-02-22 ENCOUNTER — Ambulatory Visit (INDEPENDENT_AMBULATORY_CARE_PROVIDER_SITE_OTHER): Payer: Medicare Other | Admitting: Physician Assistant

## 2015-02-22 ENCOUNTER — Telehealth: Payer: Self-pay | Admitting: Physician Assistant

## 2015-02-22 ENCOUNTER — Encounter: Payer: Self-pay | Admitting: Physician Assistant

## 2015-02-22 DIAGNOSIS — E785 Hyperlipidemia, unspecified: Secondary | ICD-10-CM | POA: Insufficient documentation

## 2015-02-22 DIAGNOSIS — I10 Essential (primary) hypertension: Secondary | ICD-10-CM | POA: Diagnosis not present

## 2015-02-22 DIAGNOSIS — Z9989 Dependence on other enabling machines and devices: Secondary | ICD-10-CM

## 2015-02-22 DIAGNOSIS — I2583 Coronary atherosclerosis due to lipid rich plaque: Secondary | ICD-10-CM

## 2015-02-22 DIAGNOSIS — G4733 Obstructive sleep apnea (adult) (pediatric): Secondary | ICD-10-CM

## 2015-02-22 DIAGNOSIS — I2510 Atherosclerotic heart disease of native coronary artery without angina pectoris: Secondary | ICD-10-CM | POA: Diagnosis not present

## 2015-02-22 NOTE — Progress Notes (Signed)
Patient ID: Kevin [name], male   DOB: 1945/12/02, 69 y.o.   MRN: [id number]    Date:  02/22/2015   ID:  Kevin [name], DOB 1945-05-29, MRN [id number]  PCP:  Purvis Kilts, MD  Primary Cardiologist:  [name]  Chief complaint: Posthospital follow-up   History of Present Illness: Kevin [name] is a 69 y.o. male   The patient is a 69 year old male with past medical history of CAD s/p PCI in past, HTN, HLD and DM who presented to [facility] on Feb 13, 2015 with CC of CP. Past medical history include remote LAD and LCx in 2001 and 2004 by Dr. Olevia Perches and Dr. Albertine Patricia. He had cath on 07/15/2013 revealing total occlusion of nondominant RCA (new when compare to cath 2011), widely patent LAD and LCx, stable 90+ percent stenosis of the jailed Diag arising from stented LAD, diffuse 50-70% O1, elevated LVEDP 67mmHg. Medical therapy was recommended.   He was transferred to [facility] for further evaluation. Cardiac catheterization was performed on 02/14/2015 which showed 100% nondominant proximal RCA occlusion unchanged from prior cath, 80% ostial D2 lesion gel by prior stent with TIMI-3 flow, 99% mid LAD focal in-stent restenosis and a 95% de novo lesion just past the distal edge of the stent, the area was treated with 3.5 x 28 mm Synergy DES postdilated to 3.8 mm. His renal function is stable after cath.   Patient presents for posthospital follow-up.  He has no complaints. He denies shortness of breath or chest pain. He is taking his aspirin and Plavix. He uses a CPAP nightly. He does have a trace of lower extremity edema.  He is on the "Whole 30" diet and has lost 80 pounds already and is interested in losing another 40. The patient currently denies nausea, vomiting, fever, orthopnea, dizziness, PND, cough, congestion, abdominal pain, hematochezia, melena, claudication.  Wt Readings from Last 3 Encounters:  02/22/15 218 lb (98.884 kg)  02/15/15 265 lb (120.203 kg)  11/17/14 270 lb (122.471 kg)      Past Medical History  Diagnosis Date  . Melanoma ([place]) 10/11    left foot  . Cellulitis 5/11    left leg  . CAD (coronary artery disease)   . HTN (hypertension)   . Diabetes mellitus   . Obesity   . Seasonal allergies   . S/P colonoscopy 07/03/03     Dr [name]->hyperplastic rectal polyp  . Diverticulosis   . Sleep apnea   . Microscopic colitis 10/09/10    Colonoscopy dr [name]  . Diverticulosis   . S/P endoscopy 10/09/10    lap band intact, duodenal erosions  . Melanoma of foot ([place]) 06/02/2011  . Hyperlipidemia   . OSA on CPAP 01/20/2014    Current Outpatient Prescriptions  Medication Sig Dispense Refill  . Ascorbic Acid (VITAMIN C) 1000 MG tablet Take 1,000 mg by mouth daily.    [name] aspirin 81 MG tablet Take 81 mg by mouth daily.    . B Complex Vitamins (VITAMIN B COMPLEX PO) Take 1 tablet by mouth daily.    . clopidogrel (PLAVIX) 75 MG tablet TAKE ONE TABLET BY MOUTH ONCE DAILY. 30 tablet 10  . Coenzyme Q10 (CO Q 10) 10 MG CAPS Take 1 capsule by mouth daily.    . fluticasone (FLONASE) 50 MCG/ACT nasal spray Place 2 sprays into both nostrils daily.    . furosemide (LASIX) 40 MG tablet Take 40 mg by mouth daily.    . metFORMIN (GLUCOPHAGE)  1000 MG tablet Take 500 mg by mouth daily.     . metoprolol (TOPROL-XL) 100 MG 24 hr tablet Take 100 mg by mouth daily.      . niacin (NIASPAN) 1000 MG CR tablet TAKE 1 TABLET BY MOUTH AT BEDTIME. 30 tablet 7  . pravastatin (PRAVACHOL) 40 MG tablet Take 40 mg by mouth daily.    [name] RESVERATROL PO Take 1 capsule by mouth daily.    . sitaGLIPtin (JANUVIA) 50 MG tablet Take by mouth daily.    . valsartan-hydrochlorothiazide (DIOVAN-HCT) 160-12.5 MG per tablet Take 1 tablet by mouth daily.      [name] NITROSTAT 0.4 MG SL tablet DISSOLVE 1 TABLET UNDER TONGUE EVERY 5 MINUTES UP TO 15 MIN FOR CHESTPAIN. IF NO RELIEF CALL 911. (Patient not taking: Reported on 02/22/2015) 25 tablet 3   No current facility-administered medications for this visit.     Allergies:    Allergies  Allergen Reactions  . Altace [Ramipril] Shortness Of Breath  . Avapro [Irbesartan] Shortness Of Breath  . Cephalexin Hives and Swelling  . Clindamycin Hcl Hives and Swelling  . Codeine Nausea And Vomiting and Other (See Comments)    AFFECTS HEART  . Fenofibrate Other (See Comments)    "My skin started peeling off"  . Glycotrol [Aminobrain] Other (See Comments)    unknown  . Indomethacin Other (See Comments)    UNKNOWN REACTION  . Minocycline Hcl Hives and Swelling  . Morphine And Related Nausea And Vomiting    PROJECTILE VOMITING  . Other Other (See Comments)    Contrast dye  . Penicillins     Has patient had a PCN reaction causing immediate rash, facial/tongue/throat swelling, SOB or lightheadedness with hypotension:  Has patient had a PCN reaction causing severe rash involving mucus membranes or skin necrosis:  Has patient had a PCN reaction that required hospitalization  Has patient had a PCN reaction occurring within the last 10 years:  If all of the above answers are "NO", then may proceed with Cephalosporin use.   . Prednisone Swelling and Other (See Comments)    SWELLING OF THE LIPS-NOT SURE OF THIS ALLERGY  . Septra [Bactrim] Hives and Swelling  . Shellfish Allergy Other (See Comments)    Unknown   . Sulfa Drugs Cross Reactors Hives and Swelling  . Tetracyclines & Related   . Tradjenta [Linagliptin]     Shortness of breath    Social History:  The patient  reports that he quit smoking about 20 years ago. His smoking use included Cigarettes. He started smoking about 50 years ago. He has a 90 pack-year smoking history. He has never used smokeless tobacco. He reports that he drinks alcohol. He reports that he does not use illicit drugs.   Family history:   Family History  Problem Relation Age of Onset  . Liver cancer Brother 43  . Celiac disease Other 24    brother's daughter  . Coronary artery disease Mother   . Diabetes Father      ROS:  Please see the history of present illness.  All other systems reviewed and negative.   PHYSICAL EXAM: VS:  BP 100/60 mmHg  Pulse 66  Resp 16  Ht 5\' 9"  (1.753 m)  Wt 218 lb (98.884 kg)  BMI 32.18 kg/m2 Obese, well developed, in no acute distress HEENT: Pupils are equal round react to light accommodation extraocular movements are intact.  Neck: no JVDNo cervical lymphadenopathy. Cardiac: Regular rate and rhythm without murmurs rubs  or gallops. Lungs:  clear to auscultation bilaterally, no wheezing, rhonchi or rales Ext: no lower extremity edema.  2+ radial and dorsalis pedis pulses. Skin: warm and dry. Right radial cath site is some ecchymosis which is in the process of going away. Good distal pulse Neuro:  Grossly normal     Lipid Panel     Component Value Date/Time   CHOL 165 02/14/2015 0317   TRIG 200* 02/14/2015 0317   HDL 35* 02/14/2015 0317   CHOLHDL 4.7 02/14/2015 0317   VLDL 40 02/14/2015 0317   LDLCALC 90 02/14/2015 0317      ASSESSMENT AND PLAN:  Problem List Items Addressed This Visit    OSA on CPAP (Chronic)   Morbid obesity (HCC) - Primary   Relevant Medications   sitaGLIPtin (JANUVIA) 50 MG tablet   Essential hypertension   Coronary artery disease, hx LAD and LCX stenting in 2001 & 2004, last cath 2009 with patent stents.     Obstructive sleep apnea: Uses CPAP nightly  Morbid obesity: Patient is on the Whole 30 diet and has already lost 80 pounds. He wishes to lose another 21.  Essential hypertension: Blood pressure well-controlled. No changes to medications.  Coronary artery disease: Mid LAD-1 lesion, 99% stenosed, focal in-stent restenosis. There is a 95% de novo lesion just past the distal edge of the stent. The entire area was treated with a 3.5 x 28 Synergy drug-eluting stent.  Continue aspirin and Plavix. Is also on metoprolol 100 mg daily and pravastatin.  Patient is interested in phase II cardiac rehabilitation. He'll be contacted in  approximately 2 weeks.  Dyslipidemia: Continue statin, continue weight loss and exercise.  Follow-up in March as scheduled

## 2015-02-22 NOTE — Patient Instructions (Addendum)
Medication Instructions:  Your physician recommends that you continue on your current medications as directed. Please refer to the Current Medication list given to you today.  Labwork: NONE  Testing/Procedures: NONE  Follow-Up: Your physician wants you to follow-up in: 6 months with Dr. [name]. You will receive a reminder letter in the mail two months in advance. If you don't receive a letter, please call our office to schedule the follow-up appointment.

## 2015-02-22 NOTE — Telephone Encounter (Signed)
Patient called to give the office his dose of Januvia. Patient's correct dose is Januvia 100 mg by mouth daily. Changes made to patient's medication list.

## 2015-02-22 NOTE — Telephone Encounter (Signed)
New message    Patient was seen today has some questions for the nurse

## 2015-03-14 ENCOUNTER — Telehealth: Payer: Self-pay | Admitting: Physician Assistant

## 2015-03-14 NOTE — Telephone Encounter (Signed)
Received Engineer, [employer] Form from [facility] office for [place], [place] to review, complete and sign.  Forms were processed by [employer] and now needs Bryan's completion.  Gaspar Bidding not in office until 03/19/15.  Forms to be given to him on 03/19/15 to review and sign. lp

## 2015-03-20 ENCOUNTER — Telehealth: Payer: Self-pay | Admitting: Physician Assistant

## 2015-03-20 NOTE — Telephone Encounter (Signed)
Mr Buracker called back and asked that we mail his signed [employer] Form to him as he lives out of town.  Verified his address.  Mail has been picked up today so will be in 03/21/15 outgoing mail. lp

## 2015-03-20 NOTE — Telephone Encounter (Signed)
Received Signed [employer] Form back from [place], [place]. Notified patient forms were ready [facility]) also spoke with daughter and advised forms ready. lp

## 2015-07-25 ENCOUNTER — Other Ambulatory Visit: Payer: Self-pay | Admitting: Cardiovascular Disease

## 2015-07-25 NOTE — Telephone Encounter (Signed)
Rx request sent to pharmacy.  

## 2015-09-24 ENCOUNTER — Other Ambulatory Visit: Payer: Self-pay | Admitting: Cardiovascular Disease

## 2015-09-25 NOTE — Telephone Encounter (Signed)
Rx(s) sent to pharmacy electronically.  

## 2015-10-03 ENCOUNTER — Encounter: Payer: Self-pay | Admitting: Cardiovascular Disease

## 2015-10-03 ENCOUNTER — Ambulatory Visit (INDEPENDENT_AMBULATORY_CARE_PROVIDER_SITE_OTHER): Payer: Medicare Other | Admitting: Cardiovascular Disease

## 2015-10-03 VITALS — BP 122/81 | HR 78 | Ht 69.5 in | Wt 279.6 lb

## 2015-10-03 DIAGNOSIS — I2510 Atherosclerotic heart disease of native coronary artery without angina pectoris: Secondary | ICD-10-CM | POA: Diagnosis not present

## 2015-10-03 DIAGNOSIS — E785 Hyperlipidemia, unspecified: Secondary | ICD-10-CM | POA: Diagnosis not present

## 2015-10-03 DIAGNOSIS — Z79899 Other long term (current) drug therapy: Secondary | ICD-10-CM

## 2015-10-03 DIAGNOSIS — I2583 Coronary atherosclerosis due to lipid rich plaque: Secondary | ICD-10-CM

## 2015-10-03 DIAGNOSIS — I10 Essential (primary) hypertension: Secondary | ICD-10-CM | POA: Diagnosis not present

## 2015-10-03 MED ORDER — PRAVASTATIN SODIUM 80 MG PO TABS: 80.0000 mg | ORAL_TABLET | Freq: Every evening | ORAL | Status: DC

## 2015-10-03 NOTE — Patient Instructions (Signed)
Medication Instructions:  Your physician has recommended you make the following change in your medication:  1- INCREASE your pravastatin to 80 mg (1 tablet) by mouth every evening. (You may take 2 (40 mg) tablets daily if you still have many of that prescription left until you get the new one.)   Labwork: Your physician recommends that you return for lab work in: 2 MONTHS (AROUND July 31ST INTO THE FIRST PART OF AUGUST. - YOU WILL NEED TO BE FASTING FOR YOU LABS.  The lab can be found on the FIRST FLOOR of out building in [address]   Testing/Procedures: NONE  Follow-Up: Your physician wants you to follow-up in: [place]. You will receive a reminder letter in the mail two months in advance. If you don't receive a letter, please call our office to schedule the follow-up appointment.   Any Other Special Instructions Will Be Listed Below (If Applicable).     If you need a refill on your cardiac medications before your next appointment, please call your pharmacy.

## 2015-10-03 NOTE — Assessment & Plan Note (Signed)
history of hyperlipidemia on pravastatin 40 mg a day with lipid profile performed 02/14/15 revealed a total cholesterol 165, LDL of 98 HDL 35. I am going to increase his pravastatin 80 mg a day and will recheck a lipid and liver profile in 2 months.

## 2015-10-03 NOTE — Progress Notes (Signed)
10/03/2015 [name]   1946-03-10  [id number]  Primary Physician Purvis Kilts, MD Primary Cardiologist: Lorretta Harp MD [name]   HPI:  The patient is a very pleasant 70 year old severely overweight divorced Caucasian male, father of 1 child, who I last saw in the office 07/21/14. 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 He has a history of CAD, status post remote LAD and circumflex stenting in 2001 and 2004 respectively by Drs. [name] and [name]. He stopped smoking and drinking at that time as well. His other problems include hypertension, hyperlipidemia, and non-insulin-requiring diabetes. He also has obstructive sleep apnea, on CPAP. He has had lap-banding in the past with ultimate reaccumulation of his weight. He has had endovenous ablation by Dr. Elisabeth Cara of his left greater saphenous vein, which really did not afford significant clinical improvement. He had a functional study performed December 22, 2008, which was nonischemic, and another one June 24, 2011, because of chest pain, which was low risk. He has been asymptomatic.Dr. [name] recently checked his lipid profile last week as an outpatient. He was admitted to [facility] last month which has been shortness of breath. He underwent cardiac catheterization by Dr. Jeralene Peters Smith's revealing patent LAD and circumflex stents and a left dominant system with an occluded nondominant RCA. Medical therapy was recommended. He experimented with stopping his newly added diabetes medicines one of which apparently was contributing to his symptoms. He feels clinically improved after stopping this medication. Since I saw him in March 2016 he was admitted with unstable angina on 02/14/15 and underwent cardiac catheterization by Dr. [name] His LAD stent had diffuse in-stent restenosis with a 95% distal edge stenosis which was restented using a 3.5 x 28 mm long synergy  drug-eluting stent. He did have an ostial diagonal branch stenosis which was "jailedand a 75% first obtuse marginal branch stenosis as well as an occluded nondominant RCA.  Current Outpatient Prescriptions  Medication Sig Dispense Refill  . Ascorbic Acid (VITAMIN C) 1000 MG tablet Take 1,000 mg by mouth daily.    [name] aspirin 81 MG tablet Take 81 mg by mouth daily.    . B Complex Vitamins (VITAMIN B COMPLEX PO) Take 1 tablet by mouth daily.    . clopidogrel (PLAVIX) 75 MG tablet TAKE ONE TABLET BY MOUTH ONCE DAILY. 30 tablet 4  . Coenzyme Q10 (CO Q 10) 10 MG CAPS Take 1 capsule by mouth daily.    . fluticasone (FLONASE) 50 MCG/ACT nasal spray Place 2 sprays into both nostrils daily.    . furosemide (LASIX) 40 MG tablet Take 40 mg by mouth daily.    . metoprolol (TOPROL-XL) 100 MG 24 hr tablet Take 100 mg by mouth daily.      . niacin (NIASPAN) 1000 MG CR tablet TAKE 1 TABLET BY MOUTH AT BEDTIME. 30 tablet 7  . NITROSTAT 0.4 MG SL tablet DISSOLVE 1 TABLET UNDER TONGUE EVERY 5 MINUTES UP TO 15 MIN FOR CHESTPAIN. IF NO RELIEF CALL 911. 25 tablet 3  . RESVERATROL PO Take 1 capsule by mouth daily.    . sitaGLIPtin (JANUVIA) 100 MG tablet Take 100 mg by mouth daily.    . valsartan-hydrochlorothiazide (DIOVAN-HCT) 160-12.5 MG per tablet Take 1 tablet by mouth daily.      . pravastatin (PRAVACHOL) 80 MG tablet Take 1 tablet (80 mg total) by mouth every evening. 90 tablet 3   No  current facility-administered medications for this visit.    Allergies  Allergen Reactions  . Altace [Ramipril] Shortness Of Breath  . Avapro [Irbesartan] Shortness Of Breath  . Cephalexin Hives and Swelling  . Clindamycin Hcl Hives and Swelling  . Codeine Nausea And Vomiting and Other (See Comments)    AFFECTS HEART  . Fenofibrate Other (See Comments)    "My skin started peeling off"  . Glycotrol [Aminobrain] Other (See Comments)    unknown  . Indomethacin Other (See Comments)    UNKNOWN REACTION  . Minocycline Hcl  Hives and Swelling  . Morphine And Related Nausea And Vomiting    PROJECTILE VOMITING  . Other Other (See Comments)    Contrast dye  . Penicillins     Has patient had a PCN reaction causing immediate rash, facial/tongue/throat swelling, SOB or lightheadedness with hypotension: No Has patient had a PCN reaction causing severe rash involving mucus membranes or skin necrosis: No Has patient had a PCN reaction that required hospitalization No Has patient had a PCN reaction occurring within the last 10 years: No If all of the above answers are "NO", then may proceed with Cephalosporin use.   . Prednisone Swelling and Other (See Comments)    SWELLING OF THE LIPS-NOT SURE OF THIS ALLERGY  . Septra [Bactrim] Hives and Swelling  . Shellfish Allergy Other (See Comments)    Unknown   . Sulfa Drugs Cross Reactors Hives and Swelling  . Tetracyclines & Related   . Tradjenta [Linagliptin]     Shortness of breath    Social History   Social History  . Marital Status: Divorced    Spouse Name: N/A  . Number of Children: 1  . Years of Education: N/A   Occupational History  . surveyor     [place]   Social History Main Topics  . Smoking status: Former Smoker -- 3.00 packs/day for 30 years    Types: Cigarettes    Start date: 11/18/1964    Quit date: 10/04/1994  . Smokeless tobacco: Never Used     Comment: quit smoking 19 years ago  . Alcohol Use: Yes     Comment: 3-4 times/year  . Drug Use: No  . Sexual Activity: Not Currently   Other Topics Concern  . Not on file   Social History Narrative     Review of Systems: General: negative for chills, fever, night sweats or weight changes.  Cardiovascular: negative for chest pain, dyspnea on exertion, edema, orthopnea, palpitations, paroxysmal nocturnal dyspnea or shortness of breath Dermatological: negative for rash Respiratory: negative for cough or wheezing Urologic: negative for hematuria Abdominal: negative for nausea, vomiting,  diarrhea, bright red blood per rectum, melena, or hematemesis Neurologic: negative for visual changes, syncope, or dizziness All other systems reviewed and are otherwise negative except as noted above.    Blood pressure 122/81, pulse 78, height 5' 9.5" (1.765 m), weight 279 lb 9.6 oz (126.826 kg).  General appearance: alert and no distress Neck: no adenopathy, no carotid bruit, no JVD, supple, symmetrical, trachea midline and thyroid not enlarged, symmetric, no tenderness/mass/nodules Lungs: clear to auscultation bilaterally Heart: regular rate and rhythm, S1, S2 normal, no murmur, click, rub or gallop Extremities: extremities normal, atraumatic, no cyanosis or edema  EKG normal sinus rhythm at 78 without ST or T-wave changes. I personally reviewed this EKG  ASSESSMENT AND PLAN:   Coronary artery disease, hx LAD and LCX stenting in 2001 & 2004, last cath 2009 with patent stents. History of  CAD status post remote LAD and circumflex stenting in 2001 in 2004 respectively by Drs. Rockne Menghini and [name].he had a cardiac catheterization performed by Dr. Pernell Dupre March 2015 revealing widely patent stents with occluded nondominant RCA. He was recently admitted with unstable angina/non-STEMI on 02/13/15 and underwent cardiac catheterization by Dr. [name] . His LAD stent had 95% stenosis at the distal edge of the previously placed stent with in-stent restenosis as well. This wasstented with a 3.5 x 28 mm long synergy drug-eluting stent.which was restented. He did have a 75% obtuse marginal branch stenosis and an occluded nondominant RCA  Essential hypertension History of hypertension blood pressure measures 122/81 He is on Diovan, hydrochlorothiazide and metoprolol. Continue current meds at current dosing  Hyperlipidemia history of hyperlipidemia on pravastatin 40 mg a day with lipid profile performed 02/14/15 revealed a total cholesterol 165, LDL of 98 HDL 35. I am going to increase his  pravastatin 80 mg a day and will recheck a lipid and liver profile in 2 months.      Lorretta Harp MD FACP,FACC,FAHA, [place] 10/03/2015 2:48 PM

## 2015-10-03 NOTE — Assessment & Plan Note (Signed)
History of CAD status post remote LAD and circumflex stenting in 2001 in 2004 respectively by Drs. Rockne Menghini and [name].he had a cardiac catheterization performed by Dr. Pernell Dupre March 2015 revealing widely patent stents with occluded nondominant RCA. He was recently admitted with unstable angina/non-STEMI on 02/13/15 and underwent cardiac catheterization by Dr. [name] . His LAD stent had 95% stenosis at the distal edge of the previously placed stent with in-stent restenosis as well. This wasstented with a 3.5 x 28 mm long synergy drug-eluting stent.which was restented. He did have a 75% obtuse marginal branch stenosis and an occluded nondominant RCA

## 2015-10-03 NOTE — Assessment & Plan Note (Signed)
History of hypertension blood pressure measures 122/81 He is on Diovan, hydrochlorothiazide and metoprolol. Continue current meds at current dosing

## 2015-10-06 ENCOUNTER — Other Ambulatory Visit: Payer: Self-pay | Admitting: Cardiovascular Disease

## 2015-10-08 NOTE — Telephone Encounter (Signed)
Rx(s) sent to pharmacy electronically.  

## 2015-11-14 ENCOUNTER — Other Ambulatory Visit (HOSPITAL_BASED_OUTPATIENT_CLINIC_OR_DEPARTMENT_OTHER): Payer: Medicare Other

## 2015-11-14 ENCOUNTER — Ambulatory Visit (HOSPITAL_BASED_OUTPATIENT_CLINIC_OR_DEPARTMENT_OTHER): Payer: Medicare Other | Admitting: Hematology & Oncology

## 2015-11-14 ENCOUNTER — Encounter: Payer: Self-pay | Admitting: Hematology & Oncology

## 2015-11-14 VITALS — BP 133/69 | HR 75 | Temp 97.6°F | Resp 16 | Ht 69.5 in | Wt 276.0 lb

## 2015-11-14 DIAGNOSIS — C4372 Malignant melanoma of left lower limb, including hip: Secondary | ICD-10-CM | POA: Diagnosis not present

## 2015-11-14 LAB — COMPREHENSIVE METABOLIC PANEL
ALBUMIN: 4.4 g/dL (ref 3.5–5.0)
ALT: 22 U/L (ref 0–55)
AST: 22 U/L (ref 5–34)
Alkaline Phosphatase: 32 U/L — ABNORMAL LOW (ref 40–150)
Anion Gap: 12 mEq/L — ABNORMAL HIGH (ref 3–11)
BILIRUBIN TOTAL: 0.5 mg/dL (ref 0.20–1.20)
BUN: 31.7 mg/dL — ABNORMAL HIGH (ref 7.0–26.0)
CHLORIDE: 102 meq/L (ref 98–109)
CO2: 28 mEq/L (ref 22–29)
Calcium: 10.8 mg/dL — ABNORMAL HIGH (ref 8.4–10.4)
Creatinine: 1.6 mg/dL — ABNORMAL HIGH (ref 0.7–1.3)
EGFR: 43 mL/min/{1.73_m2} — AB (ref >90)
GLUCOSE: 165 mg/dL — AB (ref 70–140)
POTASSIUM: 3.6 meq/L (ref 3.5–5.1)
SODIUM: 142 meq/L (ref 136–145)
Total Protein: 7.5 g/dL (ref 6.4–8.3)

## 2015-11-14 LAB — CBC WITH DIFFERENTIAL (CANCER CENTER ONLY)
BASO#: 0 10*3/uL (ref 0.0–0.2)
BASO%: 0.4 % (ref 0.0–2.0)
EOS%: 2.5 % (ref 0.0–7.0)
Eosinophils Absolute: 0.1 10*3/uL (ref 0.0–0.5)
HCT: 39.7 % (ref 38.7–49.9)
HGB: 14 g/dL (ref 13.0–17.1)
LYMPH#: 1.3 10*3/uL (ref 0.9–3.3)
LYMPH%: 26.2 % (ref 14.0–48.0)
MCH: 33.7 pg — ABNORMAL HIGH (ref 28.0–33.4)
MCHC: 35.3 g/dL (ref 32.0–35.9)
MCV: 96 fL (ref 82–98)
MONO#: 0.5 10*3/uL (ref 0.1–0.9)
MONO%: 10.5 % (ref 0.0–13.0)
NEUT#: 2.9 10*3/uL (ref 1.5–6.5)
NEUT%: 60.4 % (ref 40.0–80.0)
PLATELETS: 174 10*3/uL (ref 145–400)
RBC: 4.15 10*6/uL — ABNORMAL LOW (ref 4.20–5.70)
RDW: 12.9 % (ref 11.1–15.7)
WBC: 4.8 10*3/uL (ref 4.0–10.0)

## 2015-11-14 LAB — LACTATE DEHYDROGENASE: LDH: 212 U/L (ref 125–245)

## 2015-11-14 NOTE — Progress Notes (Signed)
Hematology and Oncology Follow Up Visit  INES STILSON [id number] 1946-02-05 70 y.o. 11/14/2015   Principle Diagnosis:  Stage IA (T1a N0 M0) melanoma of the left foot.  Current Therapy:    Observation     Interim History:  Mr.  Kevin [name] is back for followup. We see him once a year. He's been doing okay. He has had no problems as last saw him. He is trying to lose some weight.  He has had no issues with swelling in his left foot. He's had no pain. Per his had no change in bowel or bladder habits. He's had no fever. He's had no cough. He's not had any swollen nodes.   Overall, his performance status is ECOG 0.   Medications:   Past Medical History, Surgical history, Social history, and Family History were reviewed and updated.  Review of Systems: As above  Physical Exam:  height is 5' 9.5" (1.765 m) and weight is 276 lb (125.193 kg). His oral temperature is 97.6 F (36.4 C). His blood pressure is 133/69 and his pulse is 75. His respiration is 16.   Somewhat obese white. Head and neck exam shows no ocular or oral lesions. There are no palpable cervical or supraclavicular lymph nodes. Lungs are clear bilaterally. Cardiac exam regular rate rhythm with no murmurs rubs or bruits. Abdomen is obese but soft. Has good bowel sounds. There is no fluid wave. There is no palpable liver or spleen. He has no adenopathy in the inguinal region. Extremities shows well-healed wide local excision scar on his left foot. Some slight swelling is noted about the lower left leg and ankle. He has good range of motion of his joints. Skin exam shows no suspicious lesions. Neurological exam is nonfocal.  Lab Results  Component Value Date   WBC 4.8 11/14/2015   HGB 14.0 11/14/2015   HCT 39.7 11/14/2015   MCV 96 11/14/2015   PLT 174 11/14/2015     Chemistry      Component Value Date/Time   NA 140 02/15/2015 0234   K 4.1 02/15/2015 0234   CL 104 02/15/2015 0234   CO2 25 02/15/2015 0234   BUN 25*  02/15/2015 0234   CREATININE 1.26* 02/15/2015 0234   CREATININE 1.31 07/14/2013 1128      Component Value Date/Time   CALCIUM 9.4 02/15/2015 0234   ALKPHOS 31* 02/13/2015 1934   AST 18 02/13/2015 1934   ALT 19 02/13/2015 1934   BILITOT 0.5 02/13/2015 1934         Impression and Plan: Mr. Fryrear is a 70 year old gentleman with a stage Ia melanoma of the left foot. He underwent resection back in October of 2011. He's doing well. I don't see any evidence of recurrence.  And now has been 5 years. I really don't think that he will recur. As such, I think we can let him go for the practice now. We really are not adding much to his medical care.  Volanda Napoleon, MD 7/12/201711:17 AM

## 2015-11-17 ENCOUNTER — Other Ambulatory Visit: Payer: Self-pay | Admitting: Cardiovascular Disease

## 2015-11-19 NOTE — Telephone Encounter (Signed)
Rx(s) sent to pharmacy electronically.  

## 2015-12-07 ENCOUNTER — Encounter: Payer: Self-pay | Admitting: *Deleted

## 2015-12-07 LAB — HEPATIC FUNCTION PANEL
ALBUMIN: 4.3 g/dL (ref 3.6–5.1)
ALK PHOS: 29 U/L — AB (ref 40–115)
ALT: 16 U/L (ref 9–46)
AST: 19 U/L (ref 10–35)
BILIRUBIN DIRECT: 0.1 mg/dL (ref <=0.2)
BILIRUBIN INDIRECT: 0.6 mg/dL (ref 0.2–1.2)
BILIRUBIN TOTAL: 0.7 mg/dL (ref 0.2–1.2)
Total Protein: 6.8 g/dL (ref 6.1–8.1)

## 2015-12-07 LAB — LIPID PANEL
CHOL/HDL RATIO: 3.1 ratio (ref <=5.0)
Cholesterol: 121 mg/dL — ABNORMAL LOW (ref 125–200)
HDL: 39 mg/dL — AB (ref >=40)
LDL Cholesterol: 60 mg/dL (ref <130)
Triglycerides: 112 mg/dL (ref <150)
VLDL: 22 mg/dL (ref <30)

## 2016-02-21 ENCOUNTER — Other Ambulatory Visit: Payer: Self-pay | Admitting: Cardiovascular Disease

## 2016-02-21 NOTE — Telephone Encounter (Signed)
REFILL 

## 2016-05-06 ENCOUNTER — Encounter (HOSPITAL_COMMUNITY): Payer: Self-pay | Admitting: Emergency Medicine

## 2016-05-06 ENCOUNTER — Emergency Department (HOSPITAL_COMMUNITY)
Admission: EM | Admit: 2016-05-06 | Discharge: 2016-05-06 | Disposition: A | Discharge status: Home / Self Care | Payer: Medicare Other | Attending: Emergency Medicine | Admitting: Emergency Medicine

## 2016-05-06 DIAGNOSIS — N3000 Acute cystitis without hematuria: Secondary | ICD-10-CM | POA: Insufficient documentation

## 2016-05-06 DIAGNOSIS — Z87891 Personal history of nicotine dependence: Secondary | ICD-10-CM | POA: Insufficient documentation

## 2016-05-06 DIAGNOSIS — Z7982 Long term (current) use of aspirin: Secondary | ICD-10-CM | POA: Diagnosis not present

## 2016-05-06 DIAGNOSIS — R110 Nausea: Secondary | ICD-10-CM | POA: Diagnosis present

## 2016-05-06 DIAGNOSIS — E119 Type 2 diabetes mellitus without complications: Secondary | ICD-10-CM | POA: Diagnosis not present

## 2016-05-06 DIAGNOSIS — I10 Essential (primary) hypertension: Secondary | ICD-10-CM | POA: Diagnosis not present

## 2016-05-06 DIAGNOSIS — I2510 Atherosclerotic heart disease of native coronary artery without angina pectoris: Secondary | ICD-10-CM | POA: Insufficient documentation

## 2016-05-06 DIAGNOSIS — Z79899 Other long term (current) drug therapy: Secondary | ICD-10-CM | POA: Diagnosis not present

## 2016-05-06 LAB — CBC
HEMATOCRIT: 37 % — AB (ref 39.0–52.0)
HEMOGLOBIN: 12.3 g/dL — AB (ref 13.0–17.0)
MCH: 32.5 pg (ref 26.0–34.0)
MCHC: 33.2 g/dL (ref 30.0–36.0)
MCV: 97.6 fL (ref 78.0–100.0)
Platelets: 169 10*3/uL (ref 150–400)
RBC: 3.79 MIL/uL — ABNORMAL LOW (ref 4.22–5.81)
RDW: 13.2 % (ref 11.5–15.5)
WBC: 13 10*3/uL — ABNORMAL HIGH (ref 4.0–10.5)

## 2016-05-06 LAB — URINALYSIS, ROUTINE W REFLEX MICROSCOPIC
GLUCOSE, UA: NEGATIVE mg/dL
Hgb urine dipstick: NEGATIVE
NITRITE: NEGATIVE
PH: 5.5 (ref 5.0–8.0)
Protein, ur: 30 mg/dL — AB
SPECIFIC GRAVITY, URINE: 1.025 (ref 1.005–1.030)

## 2016-05-06 LAB — COMPREHENSIVE METABOLIC PANEL
ALBUMIN: 3.7 g/dL (ref 3.5–5.0)
ALT: 18 U/L (ref 17–63)
AST: 19 U/L (ref 15–41)
Alkaline Phosphatase: 60 U/L (ref 38–126)
Anion gap: 10 (ref 5–15)
BUN: 30 mg/dL — AB (ref 6–20)
CHLORIDE: 98 mmol/L — AB (ref 101–111)
CO2: 26 mmol/L (ref 22–32)
Calcium: 8.7 mg/dL — ABNORMAL LOW (ref 8.9–10.3)
Creatinine, Ser: 2.03 mg/dL — ABNORMAL HIGH (ref 0.61–1.24)
GFR calc Af Amer: 37 mL/min — ABNORMAL LOW (ref >60)
GFR calc non Af Amer: 32 mL/min — ABNORMAL LOW (ref >60)
GLUCOSE: 282 mg/dL — AB (ref 65–99)
POTASSIUM: 3.7 mmol/L (ref 3.5–5.1)
SODIUM: 134 mmol/L — AB (ref 135–145)
Total Bilirubin: 1.3 mg/dL — ABNORMAL HIGH (ref 0.3–1.2)
Total Protein: 7.3 g/dL (ref 6.5–8.1)

## 2016-05-06 LAB — URINALYSIS, MICROSCOPIC (REFLEX): Squamous Epithelial / LPF: NONE SEEN

## 2016-05-06 LAB — LIPASE, BLOOD: LIPASE: 26 U/L (ref 11–51)

## 2016-05-06 MED ORDER — ONDANSETRON 4 MG PO TBDP: ORAL_TABLET | ORAL | 0 refills | Status: DC

## 2016-05-06 MED ORDER — CIPROFLOXACIN IN D5W 400 MG/200ML IV SOLN
400.0000 mg | Freq: Once | INTRAVENOUS | Status: AC
  Administered 2016-05-06: 400 mg via INTRAVENOUS
  Filled 2016-05-06: qty 200

## 2016-05-06 MED ORDER — CIPROFLOXACIN HCL 500 MG PO TABS
500.0000 mg | ORAL_TABLET | Freq: Two times a day (BID) | ORAL | 0 refills | Status: DC
Start: 2016-05-06 — End: 2018-11-15

## 2016-05-06 MED ORDER — SODIUM CHLORIDE 0.9 % IV BOLUS (SEPSIS)
1000.0000 mL | Freq: Once | INTRAVENOUS | Status: AC
  Administered 2016-05-06: 1000 mL via INTRAVENOUS

## 2016-05-06 NOTE — ED Notes (Signed)
Pt declined IV access at this time, awaiting evaluation by physician.

## 2016-05-06 NOTE — Discharge Instructions (Signed)
Drink plenty of fluids. Follow-up with their family doctor and to 3 days for recheck

## 2016-05-06 NOTE — ED Triage Notes (Signed)
Patient states not able to eat or drink anything since Sunday. States generalized malaise with history of renal insufficiency.

## 2016-05-06 NOTE — ED Provider Notes (Signed)
[facility] DEPT Provider Note   CSN: [id number] Arrival date & time: 05/06/16  1057  By signing my name below, I, Kevin [name], attest that this documentation has been prepared under the direction and in the presence of Milton Ferguson, MD . Electronically Signed: Jeanell [name], Scribe. 05/06/2016. 1:50 PM.  History   Chief Complaint Chief Complaint  Patient presents with  . Nausea   The history is provided by the patient. No language interpreter was used.  Illness  This is a new problem. The problem occurs constantly. The problem has not changed since onset.Pertinent negatives include no chest pain, no abdominal pain and no headaches. Nothing aggravates the symptoms. Nothing relieves the symptoms. He has tried nothing for the symptoms.   HPI Comments: Kevin [name] is a 71 y.o. male who presents to the Emergency Department complaining of constant moderate nausea that started 2 days ago. He states he has not been able to eat or drink anything. He reports associated generalized weakness and chills. Reports no modifying factors. He admits to having several antibiotic allergies. Per nurse's note, pt has a hx of renal insuffiencey. He denies any sick contacts, vomiting, diarrhea, cough, or other complaints.     PCP: Purvis Kilts, MD  Past Medical History:  Diagnosis Date  . CAD (coronary artery disease)   . Cellulitis 5/11   left leg  . Diabetes mellitus   . Diverticulosis   . Diverticulosis   . HTN (hypertension)   . Hyperlipidemia   . Melanoma ([place]) 10/11   left foot  . Melanoma of foot ([place]) 06/02/2011  . Microscopic colitis 10/09/10   Colonoscopy dr [name]  . Obesity   . OSA on CPAP 01/20/2014  . S/P colonoscopy 07/03/03    Dr [name]->hyperplastic rectal polyp  . S/P endoscopy 10/09/10   lap band intact, duodenal erosions  . Seasonal allergies   . Sleep apnea     Patient Active Problem List   Diagnosis Date Noted  . Dyslipidemia 02/22/2015  . Acute coronary syndrome  ([place]) 02/14/2015  . NSTEMI (non-ST elevated myocardial infarction) ([place])   . Chest pain 02/13/2015  . OSA on CPAP 01/20/2014  . Accelerating angina ([place]) 07/14/2013  . Edema, lower extremity, bil 07/14/2013  . Coronary artery disease, hx LAD and LCX stenting in 2001 & 2004, last cath 2009 with patent stents. 04/07/2013  . Essential hypertension 04/07/2013  . Hyperlipidemia 04/07/2013  . Diabetes ([place]) 04/07/2013  . Morbid obesity ([place]) 04/07/2013  . Melanoma of foot ([place]) 06/02/2011  . Microscopic colitis 10/24/2010  . GI bleed 10/01/2010  . Diarrhea 10/01/2010    Past Surgical History:  Procedure Laterality Date  . APPENDECTOMY    . CARDIAC CATHETERIZATION N/A 02/14/2015   Procedure: Left Heart Cath and Coronary Angiography;  Surgeon: Jettie Booze, MD;  Location: [facility];  Service: Cardiovascular;  Laterality: N/A;  . CARDIAC CATHETERIZATION N/A 02/14/2015   Procedure: Coronary Stent Intervention;  Surgeon: Jettie Booze, MD;  Location: [facility];  Service: Cardiovascular;  Laterality: N/A;  . CORONARY ANGIOPLASTY  2002/2005/2008  . CORONARY ANGIOPLASTY WITH STENT PLACEMENT  06/11/2007   PTCA & stenting distal CX, PTCA & balloon angioplasty in-stent restenotic mid CX coronary artery stent  . KNEE SURGERY     left  . LEFT HEART CATHETERIZATION WITH CORONARY ANGIOGRAM N/A 07/15/2013   Procedure: LEFT HEART CATHETERIZATION WITH CORONARY ANGIOGRAM;  Surgeon: Sinclair Grooms, MD;  Location: [facility] CATH LAB;  Service: Cardiovascular;  Laterality: N/A;  . NM MYOCAR PERF WALL MOTION  06/24/2011   small mild fixed anteroseptal & anteroapical defects.  [name] PENILE PROSTHESIS IMPLANT    . SHOULDER SURGERY     left       Home Medications    Prior to Admission medications   Medication Sig Start Date End Date Taking? Authorizing Provider  Ascorbic Acid (VITAMIN C) 1000 MG tablet Take 1,000 mg by mouth daily.    Historical Provider, MD  aspirin 81 MG tablet  Take 81 mg by mouth daily.    Historical Provider, MD  B Complex Vitamins (VITAMIN B COMPLEX PO) Take 1 tablet by mouth daily.    Historical Provider, MD  clopidogrel (PLAVIX) 75 MG tablet TAKE ONE TABLET BY MOUTH ONCE DAILY. 02/21/16   Lorretta Harp, MD  Coenzyme Q10 (CO Q 10) 10 MG CAPS Take 1 capsule by mouth daily.    Historical Provider, MD  fluticasone (FLONASE) 50 MCG/ACT nasal spray Place 2 sprays into both nostrils daily.    Historical Provider, MD  furosemide (LASIX) 40 MG tablet Take 40 mg by mouth daily.    Historical Provider, MD  metoprolol (TOPROL-XL) 100 MG 24 hr tablet Take 100 mg by mouth daily.      Historical Provider, MD  niacin (NIASPAN) 1000 MG CR tablet TAKE 1 TABLET BY MOUTH AT BEDTIME. 10/08/15   Lorretta Harp, MD  nitroGLYCERIN (NITROSTAT) 0.4 MG SL tablet Place 1 tablet (0.4 mg total) under the tongue every 5 (five) minutes as needed for chest pain. MAX 3 doses. 11/19/15   Lorretta Harp, MD  pravastatin (PRAVACHOL) 80 MG tablet Take 1 tablet (80 mg total) by mouth every evening. 10/03/15   Lorretta Harp, MD  RESVERATROL PO Take 1 capsule by mouth daily.    Historical Provider, MD  sitaGLIPtin (JANUVIA) 100 MG tablet Take 100 mg by mouth daily.    Historical Provider, MD  valsartan-hydrochlorothiazide (DIOVAN-HCT) 160-12.5 MG per tablet Take 1 tablet by mouth daily.      Historical Provider, MD    Family History Family History  Problem Relation Age of Onset  . Coronary artery disease Mother   . Diabetes Father   . Liver cancer Brother 73  . Celiac disease Other 89    brother's daughter    Social History Social History  Substance Use Topics  . Smoking status: Former Smoker    Packs/day: 3.00    Years: 30.00    Types: Cigarettes    Start date: 11/18/1964    Quit date: 10/04/1994  . Smokeless tobacco: Never Used     Comment: quit smoking 19 years ago  . Alcohol use 0.0 oz/week     Comment: 3-4 times/year     Allergies   Altace [ramipril]; Avapro  [irbesartan]; Cephalexin; Clindamycin hcl; Codeine; Fenofibrate; Glycotrol [aminobrain]; Indomethacin; Minocycline hcl; Morphine and related; Other; Penicillins; Prednisone; Septra [bactrim]; Shellfish allergy; Sulfa drugs cross reactors; Tetracyclines & related; and Tradjenta [linagliptin]   Review of Systems Review of Systems  Constitutional: Positive for chills. Negative for appetite change and fatigue.  HENT: Negative for congestion, ear discharge and sinus pressure.   Eyes: Negative for discharge.  Respiratory: Negative for cough.   Cardiovascular: Negative for chest pain.  Gastrointestinal: Positive for nausea. Negative for abdominal pain, diarrhea and vomiting.  Genitourinary: Negative for frequency and hematuria.  Musculoskeletal: Negative for back pain.  Skin: Negative for rash.  Neurological: Positive for weakness (Generalized). Negative for seizures and headaches.  Psychiatric/Behavioral: Negative for hallucinations.     Physical Exam Updated Vital Signs BP 122/67 (BP Location: Left Arm)   Pulse 106   Temp 97.8 F (36.6 C) (Oral)   Resp 20   Ht 5\' 9"  (1.753 m)   Wt 275 lb (124.7 kg)   SpO2 99%   BMI 40.61 kg/m   Physical Exam  Constitutional: He is oriented to person, place, and time. He appears well-developed.  HENT:  Head: Normocephalic.  Mouth/Throat: Mucous membranes are dry.  Eyes: Conjunctivae and EOM are normal. No scleral icterus.  Neck: Neck supple. No thyromegaly present.  Cardiovascular: Normal rate and regular rhythm.  Exam reveals no gallop and no friction rub.   No murmur heard. Pulmonary/Chest: No stridor. He has no wheezes. He has no rales. He exhibits no tenderness.  Abdominal: He exhibits no distension. There is no tenderness. There is no rebound.  Musculoskeletal: Normal range of motion. He exhibits no edema.  Lymphadenopathy:    He has no cervical adenopathy.  Neurological: He is oriented to person, place, and time. He exhibits normal  muscle tone. Coordination normal.  Skin: No rash noted. No erythema.  Psychiatric: He has a normal mood and affect. His behavior is normal.     ED Treatments / Results  DIAGNOSTIC STUDIES: Oxygen Saturation is 99% on RA, normal by my interpretation.    COORDINATION OF CARE: 1:55 PM- Pt advised of plan for treatment and pt agrees.  Labs (all labs ordered are listed, but only abnormal results are displayed) Labs Reviewed  COMPREHENSIVE METABOLIC PANEL - Abnormal; Notable for the following:       Result Value   Sodium 134 (*)    Chloride 98 (*)    Glucose, Bld 282 (*)    BUN 30 (*)    Creatinine, Ser 2.03 (*)    Calcium 8.7 (*)    Total Bilirubin 1.3 (*)    GFR calc non Af Amer 32 (*)    GFR calc Af Amer 37 (*)    All other components within normal limits  CBC - Abnormal; Notable for the following:    WBC 13.0 (*)    RBC 3.79 (*)    Hemoglobin 12.3 (*)    HCT 37.0 (*)    All other components within normal limits  URINALYSIS, ROUTINE W REFLEX MICROSCOPIC - Abnormal; Notable for the following:    APPearance CLOUDY (*)    Bilirubin Urine SMALL (*)    Ketones, ur TRACE (*)    Protein, ur 30 (*)    Leukocytes, UA TRACE (*)    All other components within normal limits  URINALYSIS, MICROSCOPIC (REFLEX) - Abnormal; Notable for the following:    Bacteria, UA MANY (*)    All other components within normal limits  URINE CULTURE  LIPASE, BLOOD    EKG  EKG Interpretation None       Radiology No results found.  Procedures Procedures (including critical care time)  Medications Ordered in ED Medications  sodium chloride 0.9 % bolus 1,000 mL (not administered)  ciprofloxacin (CIPRO) IVPB 400 mg (not administered)     Initial Impression / Assessment and Plan / ED Course  I have reviewed the triage vital signs and the nursing notes.  Pertinent labs & imaging results that were available during my care of the patient were reviewed by me and considered in my medical  decision making (see chart for details).  Clinical Course    Patient with nausea poorly controlled diabetes and  bacteriuria. Patient improved with IV fluids. He will be sent home with Cipro and Zofran will follow-up with his PCP this week   The chart was scribed for me under my direct supervision.  I personally performed the history, physical, and medical decision making and all procedures in the evaluation of this patient..    Final Clinical Impressions(s) / ED Diagnoses   Final diagnoses:  None    New Prescriptions New Prescriptions   No medications on file       Milton Ferguson, MD 05/06/16 1626

## 2016-05-09 LAB — URINE CULTURE: Culture: >=100000 — AB

## 2016-05-10 ENCOUNTER — Telehealth: Payer: Self-pay

## 2016-05-10 NOTE — Telephone Encounter (Signed)
Post ED Visit - Positive Culture Follow-up  Culture report reviewed by antimicrobial stewardship pharmacist:  []  Elenor Quinones, Pharm.D. []  Heide Guile, Pharm.D., BCPS []  Parks Neptune, Pharm.D. []  Alycia Rossetti, Pharm.D., BCPS []  [employer], Pharm.D., BCPS, AAHIVP []  Legrand Como, Pharm.D., BCPS, AAHIVP []  Milus Glazier, Pharm.D. []  Stephens November, Pharm.D. [facility] Pharm D Positive urine culture Treated with Ciprofloxacin, organism sensitive to the same and no further patient follow-up is required at this time.  Genia Del 05/10/2016, 11:12 AM

## 2016-05-23 ENCOUNTER — Other Ambulatory Visit (HOSPITAL_COMMUNITY): Payer: Self-pay | Admitting: Family Medicine

## 2016-05-23 ENCOUNTER — Ambulatory Visit (HOSPITAL_COMMUNITY)
Admission: RE | Admit: 2016-05-23 | Discharge: 2016-05-23 | Disposition: A | Discharge status: Home / Self Care | Payer: Medicare Other | Source: Ambulatory Visit | Attending: Family Medicine | Admitting: Family Medicine

## 2016-05-23 DIAGNOSIS — J209 Acute bronchitis, unspecified: Secondary | ICD-10-CM | POA: Diagnosis present

## 2016-10-07 ENCOUNTER — Encounter: Payer: Self-pay | Admitting: Cardiovascular Disease

## 2016-10-07 ENCOUNTER — Ambulatory Visit (INDEPENDENT_AMBULATORY_CARE_PROVIDER_SITE_OTHER): Payer: Medicare Other | Admitting: Cardiovascular Disease

## 2016-10-07 VITALS — BP 112/60 | HR 83 | Ht 69.0 in | Wt 273.0 lb

## 2016-10-07 DIAGNOSIS — E7800 Pure hypercholesterolemia, unspecified: Secondary | ICD-10-CM

## 2016-10-07 DIAGNOSIS — I10 Essential (primary) hypertension: Secondary | ICD-10-CM

## 2016-10-07 DIAGNOSIS — Z01818 Encounter for other preprocedural examination: Secondary | ICD-10-CM | POA: Diagnosis not present

## 2016-10-07 DIAGNOSIS — R600 Localized edema: Secondary | ICD-10-CM

## 2016-10-07 DIAGNOSIS — I2510 Atherosclerotic heart disease of native coronary artery without angina pectoris: Secondary | ICD-10-CM

## 2016-10-07 NOTE — Assessment & Plan Note (Signed)
History of hyperlipidemia on statin therapy followed by his PCP 

## 2016-10-07 NOTE — Assessment & Plan Note (Signed)
History of obstructive sleep apnea on CPAP. 

## 2016-10-07 NOTE — Assessment & Plan Note (Signed)
History of essential hypertension with blood pressure 112/60. He is on metoprolol, valsartan and hydrochlorothiazide. Continue current meds at current dosing

## 2016-10-07 NOTE — Progress Notes (Signed)
10/07/2016 Kevin [name]   1946-03-11  [id number]  Primary Physician Sharilyn Sites, MD Primary Cardiologist: Lorretta Harp MD Lupe Carney, [place]  HPI:  The patient is a very pleasant 71 year old severely overweight divorced Caucasian male, father of 1 child, who I last saw in the office 10/03/15. 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 He has a history of CAD, status post remote LAD and circumflex stenting in 2001 and 2004 respectively by Drs. [name] and [name]. He stopped smoking and drinking at that time as well. His other problems include hypertension, hyperlipidemia, and non-insulin-requiring diabetes. He also has obstructive sleep apnea, on CPAP. He has had lap-banding in the past with ultimate reaccumulation of his weight. He has had endovenous ablation by Dr. Elisabeth Cara of his left greater saphenous vein, which really did not afford significant clinical improvement. He had a functional study performed December 22, 2008, which was nonischemic, and another one June 24, 2011, because of chest pain, which was low risk. He has been asymptomatic.Dr. [name] recently checked his lipid profile last week as an outpatient. He was admitted to [facility] last month which has been shortness of breath. He underwent cardiac catheterization by Dr. Jeralene Peters Smith's revealing patent LAD and circumflex stents and a left dominant system with an occluded nondominant RCA. Medical therapy was recommended. He experimented with stopping his newly added diabetes medicines one of which apparently was contributing to his symptoms. He feels clinically improved after stopping this medication. Since I saw him in March 2016 he was admitted with unstable angina on 02/14/15 and underwent cardiac catheterization by Dr. [name] His LAD stent had diffuse in-stent restenosis with a 95% distal edge stenosis which was restented using a 3.5 x 28 mm long synergy drug-eluting  stent. He did have an ostial diagonal branch stenosis which was "jailedand a 75% first obtuse marginal branch stenosis as well as an occluded nondominant RCA. Since I saw him a year ago he remained stable. He does apparently near knee replacement. He gets occasional arm pain but denies chest pain.   Current Outpatient Prescriptions  Medication Sig Dispense Refill  . Ascorbic Acid (VITAMIN C) 1000 MG tablet Take 1,000 mg by mouth daily.    [name] aspirin 81 MG tablet Take 81 mg by mouth daily.    . ciprofloxacin (CIPRO) 500 MG tablet Take 1 tablet (500 mg total) by mouth 2 (two) times daily. One po bid x 7 days 14 tablet 0  . clopidogrel (PLAVIX) 75 MG tablet TAKE ONE TABLET BY MOUTH ONCE DAILY. 30 tablet 11  . Coenzyme Q10 (CO Q 10) 10 MG CAPS Take 1 capsule by mouth daily.    . fluticasone (FLONASE) 50 MCG/ACT nasal spray Place 2 sprays into both nostrils daily.    . furosemide (LASIX) 40 MG tablet Take 40 mg by mouth daily.    . metoprolol (TOPROL-XL) 100 MG 24 hr tablet Take 100 mg by mouth daily.      . niacin (NIASPAN) 1000 MG CR tablet TAKE 1 TABLET BY MOUTH AT BEDTIME. 30 tablet 10  . nitroGLYCERIN (NITROSTAT) 0.4 MG SL tablet Place 1 tablet (0.4 mg total) under the tongue every 5 (five) minutes as needed for chest pain. MAX 3 doses. 25 tablet 3  . ondansetron (ZOFRAN ODT) 4 MG disintegrating tablet 4mg  ODT q4 hours prn nausea/vomit 12 tablet 0  . pravastatin (PRAVACHOL) 80 MG tablet Take 1 tablet (  80 mg total) by mouth every evening. 90 tablet 3  . RESVERATROL PO Take 1 capsule by mouth daily.    . sitaGLIPtin (JANUVIA) 100 MG tablet Take 100 mg by mouth daily.    . valsartan-hydrochlorothiazide (DIOVAN-HCT) 160-12.5 MG per tablet Take 1 tablet by mouth daily.       No current facility-administered medications for this visit.       Social History   Social History  . Marital status: Divorced    Spouse name: N/A  . Number of children: 1  . Years of education: N/A   Occupational  History  . Engineer, production [employer]    [employer]   Social History Main Topics  . Smoking status: Former Smoker    Packs/day: 3.00    Years: 30.00    Types: Cigarettes    Start date: 11/18/1964    Quit date: 10/04/1994  . Smokeless tobacco: Never Used     Comment: quit smoking 19 years ago  . Alcohol use 0.0 oz/week     Comment: 3-4 times/year  . Drug use: No  . Sexual activity: Not Currently   Other Topics Concern  . Not on file   Social History Narrative  . No narrative on file     Review of Systems: General: negative for chills, fever, night sweats or weight changes.  Cardiovascular: negative for chest pain, dyspnea on exertion, edema, orthopnea, palpitations, paroxysmal nocturnal dyspnea or shortness of breath Dermatological: negative for rash Respiratory: negative for cough or wheezing Urologic: negative for hematuria Abdominal: negative for nausea, vomiting, diarrhea, bright red blood per rectum, melena, or hematemesis Neurologic: negative for visual changes, syncope, or dizziness All other systems reviewed and are otherwise negative except as noted above.    Blood pressure 112/60, pulse 83, height 5\' 9"  (1.753 m), weight 273 lb (123.8 kg).  General appearance: alert and no distress Neck: no adenopathy, no carotid bruit, no JVD, supple, symmetrical, trachea midline and thyroid not enlarged, symmetric, no tenderness/mass/nodules Lungs: clear to auscultation bilaterally Heart: regular rate and rhythm, S1, S2 normal, no murmur, click, rub or gallop Extremities: extremities normal, atraumatic, no cyanosis or edema  EKG sinus rhythm at 83 without ST or T-wave changes. I personally reviewed this EKG  ASSESSMENT AND PLAN:   OSA on CPAP History of obstructive sleep apnea on CPAP  Coronary artery disease, hx LAD and LCX stenting in 2001 & 2004, last cath 2009 with patent stents. History of CAD status post remote LAD and circumflex stenting in 2001 at 2004 respectively  by Dr. [name] and [name]. He was catheterized by Dr. [name] revealing patent stents again by Dr. Illene Bolus 02/14/15 sodium unstable angina, shiny and 95% distal LAD stent edge stenosis which was restented with a 3 5 x 20 mm long synergy drug-eluting stent. He did have ostial diagonal branch stenosis that was jailed as well as the first obtuse marginal branch lesion and occluded nondominant RCA. He gets occasional arm pain but no chest pain. He wants to get a total knee replacement. I'm going to go from neurologic Myoview stress test to risk stratify him since it's been 2 years since his last stent procedure.  Essential hypertension History of essential hypertension with blood pressure 112/60. He is on metoprolol, valsartan and hydrochlorothiazide. Continue current meds at current dosing  Hyperlipidemia History of hyperlipidemia on statin therapy followed by his PCP      Lorretta Harp MD [facility], [facility] 10/07/2016 9:43 AM

## 2016-10-07 NOTE — Assessment & Plan Note (Signed)
History of CAD status post remote LAD and circumflex stenting in 2001 at 2004 respectively by Dr. [name] and [name]. He was catheterized by Dr. [name] revealing patent stents again by Dr. Illene Bolus 02/14/15 sodium unstable angina, shiny and 95% distal LAD stent edge stenosis which was restented with a 3 5 x 20 mm long synergy drug-eluting stent. He did have ostial diagonal branch stenosis that was jailed as well as the first obtuse marginal branch lesion and occluded nondominant RCA. He gets occasional arm pain but no chest pain. He wants to get a total knee replacement. I'm going to go from neurologic Myoview stress test to risk stratify him since it's been 2 years since his last stent procedure.

## 2016-10-07 NOTE — Patient Instructions (Signed)
Medication Instructions: Your physician recommends that you continue on your current medications as directed. Please refer to the Current Medication list given to you today.   Testing/Procedures: Your physician has requested that you have a lower extremity arterial duplex. During this test, ultrasound is used to evaluate arterial blood flow in the legs. Allow one hour for this exam. There are no restrictions or special instructions.  Your physician has requested that you have an ankle brachial index (ABI). During this test an ultrasound and blood pressure cuff are used to evaluate the arteries that supply the arms and legs with blood. Allow thirty minutes for this exam. There are no restrictions or special instructions.  Follow-Up: Your physician wants you to follow-up in: 1 year with Dr. Berry. You will receive a reminder letter in the mail two months in advance. If you don't receive a letter, please call our office to schedule the follow-up appointment.  If you need a refill on your cardiac medications before your next appointment, please call your pharmacy.  

## 2016-10-23 ENCOUNTER — Other Ambulatory Visit: Payer: Self-pay | Admitting: Cardiovascular Disease

## 2016-10-23 DIAGNOSIS — I739 Peripheral vascular disease, unspecified: Secondary | ICD-10-CM

## 2016-10-23 NOTE — Telephone Encounter (Signed)
Rx(s) sent to pharmacy electronically.  

## 2016-11-07 ENCOUNTER — Telehealth (HOSPITAL_COMMUNITY): Payer: Self-pay

## 2016-11-07 NOTE — Telephone Encounter (Signed)
Encounter complete. 

## 2016-11-11 ENCOUNTER — Telehealth (HOSPITAL_COMMUNITY): Payer: Self-pay

## 2016-11-11 NOTE — Telephone Encounter (Signed)
Encounter complete. 

## 2016-11-12 ENCOUNTER — Ambulatory Visit (HOSPITAL_COMMUNITY)
Admission: RE | Admit: 2016-11-12 | Discharge: 2016-11-12 | Disposition: A | Discharge status: Home / Self Care | Payer: Medicare Other | Source: Ambulatory Visit | Attending: Cardiology | Admitting: Cardiology

## 2016-11-12 ENCOUNTER — Ambulatory Visit (HOSPITAL_BASED_OUTPATIENT_CLINIC_OR_DEPARTMENT_OTHER)
Admission: RE | Admit: 2016-11-12 | Discharge: 2016-11-12 | Disposition: A | Discharge status: Home / Self Care | Payer: Medicare Other | Source: Ambulatory Visit | Attending: Cardiovascular Disease | Admitting: Cardiovascular Disease

## 2016-11-12 DIAGNOSIS — E1151 Type 2 diabetes mellitus with diabetic peripheral angiopathy without gangrene: Secondary | ICD-10-CM | POA: Insufficient documentation

## 2016-11-12 DIAGNOSIS — I10 Essential (primary) hypertension: Secondary | ICD-10-CM | POA: Insufficient documentation

## 2016-11-12 DIAGNOSIS — I2510 Atherosclerotic heart disease of native coronary artery without angina pectoris: Secondary | ICD-10-CM | POA: Diagnosis not present

## 2016-11-12 DIAGNOSIS — I739 Peripheral vascular disease, unspecified: Secondary | ICD-10-CM

## 2016-11-12 DIAGNOSIS — Z6841 Body Mass Index (BMI) 40.0 and over, adult: Secondary | ICD-10-CM | POA: Insufficient documentation

## 2016-11-12 DIAGNOSIS — Z01818 Encounter for other preprocedural examination: Secondary | ICD-10-CM | POA: Diagnosis not present

## 2016-11-12 DIAGNOSIS — R600 Localized edema: Secondary | ICD-10-CM

## 2016-11-12 DIAGNOSIS — G4733 Obstructive sleep apnea (adult) (pediatric): Secondary | ICD-10-CM | POA: Insufficient documentation

## 2016-11-12 DIAGNOSIS — E669 Obesity, unspecified: Secondary | ICD-10-CM | POA: Diagnosis not present

## 2016-11-12 DIAGNOSIS — Z87891 Personal history of nicotine dependence: Secondary | ICD-10-CM | POA: Insufficient documentation

## 2016-11-12 LAB — MYOCARDIAL PERFUSION IMAGING
CHL CUP NUCLEAR SDS: 3
CHL CUP RESTING HR STRESS: 60 {beats}/min
LV dias vol: 99 mL (ref 62–150)
LVSYSVOL: 40 mL
NUC STRESS TID: 1.06
Peak HR: 81 {beats}/min
SRS: 2
SSS: 5

## 2016-11-12 MED ORDER — REGADENOSON 0.4 MG/5ML IV SOLN
0.4000 mg | Freq: Once | INTRAVENOUS | Status: AC
  Administered 2016-11-12: 0.4 mg via INTRAVENOUS

## 2016-11-12 MED ORDER — TECHNETIUM TC 99M TETROFOSMIN IV KIT
29.6000 | PACK | Freq: Once | INTRAVENOUS | Status: AC | PRN
  Administered 2016-11-12: 29.6 via INTRAVENOUS
  Filled 2016-11-12: qty 30

## 2016-11-12 MED ORDER — TECHNETIUM TC 99M TETROFOSMIN IV KIT
9.7000 | PACK | Freq: Once | INTRAVENOUS | Status: AC | PRN
  Administered 2016-11-12: 9.7 via INTRAVENOUS
  Filled 2016-11-12: qty 10

## 2016-11-18 ENCOUNTER — Telehealth: Payer: Self-pay | Admitting: *Deleted

## 2016-11-18 NOTE — Telephone Encounter (Signed)
Left message to call back for results  Notes recorded by Lorretta Harp, MD on 11/17/2016 at 2:24 PM EDT Essentially normal study. Repeat when clinically indicated

## 2017-01-13 ENCOUNTER — Other Ambulatory Visit: Payer: Self-pay | Admitting: Cardiovascular Disease

## 2017-01-13 NOTE — Telephone Encounter (Signed)
REFILL 

## 2017-04-16 ENCOUNTER — Other Ambulatory Visit (HOSPITAL_COMMUNITY): Payer: Self-pay | Admitting: Family Medicine

## 2017-04-16 DIAGNOSIS — Z122 Encounter for screening for malignant neoplasm of respiratory organs: Secondary | ICD-10-CM

## 2017-04-21 ENCOUNTER — Other Ambulatory Visit: Payer: Self-pay

## 2017-04-21 NOTE — Patient Outreach (Signed)
Kevin [name]) Care Management  04/21/2017  Kevin [name] [birth year] [id number]   Medication Adherence call to Kevin [name] patient is showing past due under [place] health Care Ins.on Pravastatin 40 mg patient did not answer call [place]  they said patient already pick up from pharmacy a 90 days supply on 04/06/17 patient wont be due until February 2019.  [facility] Management Direct Dial [phone]  Fax [phone] [email or web address]

## 2017-09-04 ENCOUNTER — Other Ambulatory Visit (HOSPITAL_COMMUNITY): Payer: Self-pay | Admitting: Family Medicine

## 2017-09-04 ENCOUNTER — Ambulatory Visit (HOSPITAL_COMMUNITY)
Admission: RE | Admit: 2017-09-04 | Discharge: 2017-09-04 | Disposition: A | Discharge status: Home / Self Care | Payer: Medicare Other | Source: Ambulatory Visit | Attending: Family Medicine | Admitting: Family Medicine

## 2017-09-04 DIAGNOSIS — J209 Acute bronchitis, unspecified: Secondary | ICD-10-CM | POA: Diagnosis not present

## 2017-09-04 DIAGNOSIS — J069 Acute upper respiratory infection, unspecified: Secondary | ICD-10-CM

## 2017-10-07 ENCOUNTER — Encounter: Payer: Self-pay | Admitting: Cardiovascular Disease

## 2017-10-07 ENCOUNTER — Ambulatory Visit: Payer: Medicare Other | Admitting: Cardiovascular Disease

## 2017-10-07 VITALS — BP 144/68 | HR 72 | Ht 69.0 in | Wt 265.0 lb

## 2017-10-07 DIAGNOSIS — E7800 Pure hypercholesterolemia, unspecified: Secondary | ICD-10-CM

## 2017-10-07 DIAGNOSIS — I2510 Atherosclerotic heart disease of native coronary artery without angina pectoris: Secondary | ICD-10-CM | POA: Diagnosis not present

## 2017-10-07 DIAGNOSIS — I10 Essential (primary) hypertension: Secondary | ICD-10-CM | POA: Diagnosis not present

## 2017-10-07 NOTE — Assessment & Plan Note (Signed)
History of essential hypertension with blood pressure measured today at 144/68.  He says usually at home his blood pressure runs between 120 and 130/70.  He is on metoprolol, valsartan and hydrochlorothiazide.  Continue current meds at current dosing.

## 2017-10-07 NOTE — Assessment & Plan Note (Signed)
History of CAD status post remote LAD stenting and circumflex stenting in 2001 in 2004 respectively by Dr. [name]  and Dr. [name].  He had cardiac catheterization by Dr. [name] revealing patent LAD and circumflex stents and a left dominant system with an occluded nondominant RCA.  Medical therapy was recommended at that time.  He was recatheterized 02/14/2015 by Dr. [name] and he had diffuse LAD in-stent restenosis and underwent restenting using a 3.5 x 28 mm long Synergy drug-eluting stent.  He did have a jailed diagonal branch ostium.  He had no recurrent symptoms.

## 2017-10-07 NOTE — Patient Instructions (Signed)

## 2017-10-07 NOTE — Assessment & Plan Note (Signed)
History of obstructive sleep apnea currently not wearing his CPAP because his machine is broken.

## 2017-10-07 NOTE — Progress Notes (Signed)
10/07/2017 Kevin [name]   Jan 07, 1946  [id number]  Primary Physician Sharilyn Sites, MD Primary Cardiologist: Kevin Harp MD FACP, [place], [place], [place]  HPI:  Kevin [name] is a 72 y.o.  severely overweight divorced Caucasian male, father of 1 child, who I last saw in the office  10/07/2016. 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 He has a history of CAD, status post remote LAD and circumflex stenting in 2001 and 2004 respectively by Drs. Kevin [name] and Kevin [name]. He stopped smoking and drinking at that time as well. His other problems include hypertension, hyperlipidemia, and non-insulin-requiring diabetes. He also has obstructive sleep apnea, on CPAP. He has had lap-banding in the past with ultimate reaccumulation of his weight. He has had endovenous ablation by Dr. Elisabeth [name] of his left greater saphenous vein, which really did not afford significant clinical improvement. He had a functional study performed December 22, 2008, which was nonischemic, and another one June 24, 2011, because of chest pain, which was low risk. He has been asymptomatic.Dr. Hilma [name] recently checked his lipid profile last week as an outpatient. He was admitted to [name] last month which has been shortness of breath. He underwent cardiac catheterization by Dr. Jeralene Peters [name]'s revealing patent LAD and circumflex stents and a left dominant system with an occluded nondominant RCA. Medical therapy was recommended. He experimented with stopping his newly added diabetes medicines one of which apparently was contributing to his symptoms. He feels clinically improved after stopping this medication. Since I saw him in March 2016 he was admitted with unstable angina on 02/14/15 and underwent cardiac catheterization by Dr. Irish [name] His LAD stent had diffuse in-stent restenosis with a 95% distal edge stenosis which was restented using a 3.5 x 28 mm long synergy drug-eluting stent. He did  have an ostial diagonal branch stenosis which was "jailedand a 75% first obtuse marginal branch stenosis as well as an occluded nondominant RCA. Since I saw him a year ago he remained stable. He does apparently near knee replacement.  He specifically denies chest pain or shortness of breath.     Current Meds  Medication Sig  . Ascorbic Acid (VITAMIN C) 1000 MG tablet Take 1,000 mg by mouth daily.  [name] aspirin 81 MG tablet Take 81 mg by mouth daily.  . ciprofloxacin (CIPRO) 500 MG tablet Take 1 tablet (500 mg total) by mouth 2 (two) times daily. One po bid x 7 days  . clopidogrel (PLAVIX) 75 MG tablet TAKE ONE TABLET BY MOUTH ONCE DAILY.  [name] Coenzyme Q10 (CO Q 10) 10 MG CAPS Take 1 capsule by mouth daily.  . fluticasone (FLONASE) 50 MCG/ACT nasal spray Place 2 sprays into both nostrils daily.  . furosemide (LASIX) 40 MG tablet Take 40 mg by mouth daily.  . metoprolol (TOPROL-XL) 100 MG 24 hr tablet Take 100 mg by mouth daily.    . niacin (NIASPAN) 1000 MG CR tablet TAKE 1 TABLET BY MOUTH AT BEDTIME.  . nitroGLYCERIN (NITROSTAT) 0.4 MG SL tablet Place 1 tablet (0.4 mg total) under the tongue every 5 (five) minutes as needed for chest pain. MAX 3 doses.  [name] ondansetron (ZOFRAN ODT) 4 MG disintegrating tablet 4mg  ODT q4 hours prn nausea/vomit  . pravastatin (PRAVACHOL) 80 MG tablet Take 1 tablet (80 mg total) by mouth every evening.  [name] RESVERATROL PO Take 1 capsule by mouth daily.  . sitaGLIPtin (JANUVIA) 100 MG tablet Take  100 mg by mouth daily.  . valsartan-hydrochlorothiazide (DIOVAN-HCT) 160-12.5 MG per tablet Take 1 tablet by mouth daily.         Social History   Socioeconomic History  . Marital status: Divorced    Spouse name: Not on file  . Number of children: 1  . Years of education: Not on file  . Highest education level: Not on file  Occupational History  . Occupation: Leisure centre manager: [employer]    Comment: [place]  Social Needs  . Financial resource strain:  Not on file  . Food insecurity:    Worry: Not on file    Inability: Not on file  . Transportation needs:    Medical: Not on file    Non-medical: Not on file  Tobacco Use  . Smoking status: Former Smoker    Packs/day: 3.00    Years: 30.00    Pack years: 90.00    Types: Cigarettes    Start date: 11/18/1964    Last attempt to quit: 10/04/1994    Years since quitting: 23.0  . Smokeless tobacco: Never Used  . Tobacco comment: quit smoking 19 years ago  Substance and Sexual Activity  . Alcohol use: Yes    Alcohol/week: 0.0 oz    Comment: 3-4 times/year  . Drug use: No  . Sexual activity: Not Currently  Lifestyle  . Physical activity:    Days per week: Not on file    Minutes per session: Not on file  . Stress: Not on file  Relationships  . Social connections:    Talks on phone: Not on file    Gets together: Not on file    Attends religious service: Not on file    Active member of club or organization: Not on file    Attends meetings of clubs or organizations: Not on file    Relationship status: Not on file  . Intimate partner violence:    Fear of current or ex partner: Not on file    Emotionally abused: Not on file    Physically abused: Not on file    Forced sexual activity: Not on file  Other Topics Concern  . Not on file  Social History Narrative  . Not on file     Review of Systems: General: negative for chills, fever, night sweats or weight changes.  Cardiovascular: negative for chest pain, dyspnea on exertion, edema, orthopnea, palpitations, paroxysmal nocturnal dyspnea or shortness of breath Dermatological: negative for rash Respiratory: negative for cough or wheezing Urologic: negative for hematuria Abdominal: negative for nausea, vomiting, diarrhea, bright red blood per rectum, melena, or hematemesis Neurologic: negative for visual changes, syncope, or dizziness All other systems reviewed and are otherwise negative except as noted above.    Blood pressure (!)  144/68, pulse 72, height 5\' 9"  (1.753 m), weight 265 lb (120.2 kg).  General appearance: alert and no distress Neck: no adenopathy, no carotid bruit, no JVD, supple, symmetrical, trachea midline and thyroid not enlarged, symmetric, no tenderness/mass/nodules Lungs: clear to auscultation bilaterally Heart: regular rate and rhythm, S1, S2 normal, no murmur, click, rub or gallop Extremities: extremities normal, atraumatic, no cyanosis or edema Pulses: 2+ and symmetric Skin: Skin color, texture, turgor normal. No rashes or lesions Neurologic: Alert and oriented X 3, normal strength and tone. Normal symmetric reflexes. Normal coordination and gait  EKG sinus rhythm at 72 without ST or T wave changes.  I personally reviewed this EKG.  ASSESSMENT AND PLAN:  OSA on CPAP History of obstructive sleep apnea currently not wearing his CPAP because his machine is broken.  Coronary artery disease, hx LAD and LCX stenting in 2001 & 2004, last cath 2009 with patent stents. History of CAD status post remote LAD stenting and circumflex stenting in 2001 in 2004 respectively by Dr. Eustace [name]  and Dr. Rushie [name].  He had cardiac catheterization by Dr. [name] revealing patent LAD and circumflex stents and a left dominant system with an occluded nondominant RCA.  Medical therapy was recommended at that time.  He was recatheterized 02/14/2015 by Dr. Irish [name] and he had diffuse LAD in-stent restenosis and underwent restenting using a 3.5 x 28 mm long Synergy drug-eluting stent.  He did have a jailed diagonal branch ostium.  He had no recurrent symptoms.  Essential hypertension History of essential hypertension with blood pressure measured today at 144/68.  He says usually at home his blood pressure runs between 120 and 130/70.  He is on metoprolol, valsartan and hydrochlorothiazide.  Continue current meds at current dosing.  Hyperlipidemia History of hyperlipidemia on statin therapy.  We will recheck a lipid and  liver profile.      Kevin Harp MD FACP,FACC,FAHA, [facility] 10/07/2017 10:18 AM

## 2017-10-07 NOTE — Assessment & Plan Note (Signed)
History of hyperlipidemia on statin therapy. We will recheck a lipid and liver profile 

## 2017-10-12 LAB — LIPID PANEL
CHOLESTEROL TOTAL: 148 mg/dL (ref 100–199)
Chol/HDL Ratio: 3.3 ratio (ref 0.0–5.0)
HDL: 45 mg/dL (ref >39)
LDL Calculated: 74 mg/dL (ref 0–99)
Triglycerides: 145 mg/dL (ref 0–149)
VLDL Cholesterol Cal: 29 mg/dL (ref 5–40)

## 2017-10-12 LAB — HEPATIC FUNCTION PANEL
ALK PHOS: 31 IU/L — AB (ref 39–117)
ALT: 18 IU/L (ref 0–44)
AST: 12 IU/L (ref 0–40)
Albumin: 4.5 g/dL (ref 3.5–4.8)
Bilirubin Total: 0.5 mg/dL (ref 0.0–1.2)
Bilirubin, Direct: 0.15 mg/dL (ref 0.00–0.40)
Total Protein: 6.7 g/dL (ref 6.0–8.5)

## 2017-10-13 ENCOUNTER — Encounter: Payer: Self-pay | Admitting: *Deleted

## 2017-10-16 ENCOUNTER — Other Ambulatory Visit: Payer: Self-pay | Admitting: Cardiovascular Disease

## 2017-10-16 NOTE — Telephone Encounter (Signed)
Rx request sent to pharmacy.  

## 2017-12-25 DIAGNOSIS — H9193 Unspecified hearing loss, bilateral: Secondary | ICD-10-CM | POA: Insufficient documentation

## 2017-12-25 DIAGNOSIS — H6990 Unspecified Eustachian tube disorder, unspecified ear: Secondary | ICD-10-CM | POA: Insufficient documentation

## 2017-12-25 DIAGNOSIS — H6521 Chronic serous otitis media, right ear: Secondary | ICD-10-CM | POA: Insufficient documentation

## 2018-01-14 ENCOUNTER — Other Ambulatory Visit: Payer: Self-pay | Admitting: Cardiovascular Disease

## 2018-02-19 DIAGNOSIS — J310 Chronic rhinitis: Secondary | ICD-10-CM | POA: Insufficient documentation

## 2018-04-09 ENCOUNTER — Other Ambulatory Visit: Payer: Self-pay | Admitting: Cardiovascular Disease

## 2018-05-14 ENCOUNTER — Other Ambulatory Visit: Payer: Self-pay | Admitting: Cardiovascular Disease

## 2018-05-14 NOTE — Telephone Encounter (Signed)
Rx request sent to pharmacy.  

## 2018-08-12 ENCOUNTER — Other Ambulatory Visit: Payer: Self-pay | Admitting: Cardiovascular Disease

## 2018-08-12 NOTE — Telephone Encounter (Signed)
Clopidogrel refilled

## 2018-11-15 ENCOUNTER — Emergency Department (HOSPITAL_COMMUNITY)
Admission: EM | Admit: 2018-11-15 | Discharge: 2018-11-15 | Disposition: A | Discharge status: Home / Self Care | Payer: Medicare Other | Attending: Emergency Medicine | Admitting: Emergency Medicine

## 2018-11-15 ENCOUNTER — Encounter (HOSPITAL_COMMUNITY): Payer: Self-pay | Admitting: Emergency Medicine

## 2018-11-15 ENCOUNTER — Other Ambulatory Visit: Payer: Self-pay

## 2018-11-15 DIAGNOSIS — Z79899 Other long term (current) drug therapy: Secondary | ICD-10-CM | POA: Insufficient documentation

## 2018-11-15 DIAGNOSIS — I2510 Atherosclerotic heart disease of native coronary artery without angina pectoris: Secondary | ICD-10-CM | POA: Insufficient documentation

## 2018-11-15 DIAGNOSIS — N3001 Acute cystitis with hematuria: Secondary | ICD-10-CM | POA: Insufficient documentation

## 2018-11-15 DIAGNOSIS — I10 Essential (primary) hypertension: Secondary | ICD-10-CM | POA: Insufficient documentation

## 2018-11-15 DIAGNOSIS — R509 Fever, unspecified: Secondary | ICD-10-CM | POA: Insufficient documentation

## 2018-11-15 DIAGNOSIS — Z7982 Long term (current) use of aspirin: Secondary | ICD-10-CM | POA: Diagnosis not present

## 2018-11-15 DIAGNOSIS — Z87891 Personal history of nicotine dependence: Secondary | ICD-10-CM | POA: Insufficient documentation

## 2018-11-15 DIAGNOSIS — R531 Weakness: Secondary | ICD-10-CM | POA: Diagnosis present

## 2018-11-15 DIAGNOSIS — E119 Type 2 diabetes mellitus without complications: Secondary | ICD-10-CM | POA: Diagnosis not present

## 2018-11-15 DIAGNOSIS — I252 Old myocardial infarction: Secondary | ICD-10-CM | POA: Diagnosis not present

## 2018-11-15 DIAGNOSIS — Z7902 Long term (current) use of antithrombotics/antiplatelets: Secondary | ICD-10-CM | POA: Insufficient documentation

## 2018-11-15 DIAGNOSIS — Z951 Presence of aortocoronary bypass graft: Secondary | ICD-10-CM | POA: Insufficient documentation

## 2018-11-15 DIAGNOSIS — E785 Hyperlipidemia, unspecified: Secondary | ICD-10-CM | POA: Insufficient documentation

## 2018-11-15 LAB — URINALYSIS, ROUTINE W REFLEX MICROSCOPIC
Bilirubin Urine: NEGATIVE
Glucose, UA: 50 mg/dL — AB
Hgb urine dipstick: NEGATIVE
Ketones, ur: NEGATIVE mg/dL
Nitrite: NEGATIVE
Protein, ur: NEGATIVE mg/dL
Specific Gravity, Urine: 1.014 (ref 1.005–1.030)
WBC, UA: >50 WBC/hpf — ABNORMAL HIGH (ref 0–5)
pH: 5 (ref 5.0–8.0)

## 2018-11-15 LAB — CBC
HCT: 37.9 % — ABNORMAL LOW (ref 39.0–52.0)
Hemoglobin: 12.7 g/dL — ABNORMAL LOW (ref 13.0–17.0)
MCH: 32.3 pg (ref 26.0–34.0)
MCHC: 33.5 g/dL (ref 30.0–36.0)
MCV: 96.4 fL (ref 80.0–100.0)
Platelets: 152 10*3/uL (ref 150–400)
RBC: 3.93 MIL/uL — ABNORMAL LOW (ref 4.22–5.81)
RDW: 13.1 % (ref 11.5–15.5)
WBC: 12.3 10*3/uL — ABNORMAL HIGH (ref 4.0–10.5)
nRBC: 0 % (ref 0.0–0.2)

## 2018-11-15 LAB — BASIC METABOLIC PANEL
Anion gap: 10 (ref 5–15)
BUN: 31 mg/dL — ABNORMAL HIGH (ref 8–23)
CO2: 25 mmol/L (ref 22–32)
Calcium: 9.4 mg/dL (ref 8.9–10.3)
Chloride: 100 mmol/L (ref 98–111)
Creatinine, Ser: 1.6 mg/dL — ABNORMAL HIGH (ref 0.61–1.24)
GFR calc Af Amer: 49 mL/min — ABNORMAL LOW (ref >60)
GFR calc non Af Amer: 42 mL/min — ABNORMAL LOW (ref >60)
Glucose, Bld: 191 mg/dL — ABNORMAL HIGH (ref 70–99)
Potassium: 3.6 mmol/L (ref 3.5–5.1)
Sodium: 135 mmol/L (ref 135–145)

## 2018-11-15 MED ORDER — CIPROFLOXACIN HCL 500 MG PO TABS: 500.0000 mg | ORAL_TABLET | Freq: Two times a day (BID) | ORAL | 0 refills | Status: DC

## 2018-11-15 MED ORDER — CIPROFLOXACIN HCL 250 MG PO TABS
500.0000 mg | ORAL_TABLET | Freq: Once | ORAL | Status: AC
  Administered 2018-11-15: 20:00:00 500 mg via ORAL

## 2018-11-15 MED ORDER — CIPROFLOXACIN IN D5W 400 MG/200ML IV SOLN
400.0000 mg | Freq: Once | INTRAVENOUS | Status: DC
  Filled 2018-11-15: qty 200

## 2018-11-15 MED ORDER — SODIUM CHLORIDE 0.9 % IV BOLUS
1000.0000 mL | Freq: Once | INTRAVENOUS | Status: AC
  Administered 2018-11-15: 1000 mL via INTRAVENOUS

## 2018-11-15 MED ORDER — ACETAMINOPHEN 325 MG PO TABS
650.0000 mg | ORAL_TABLET | Freq: Once | ORAL | Status: AC
  Administered 2018-11-15: 18:00:00 650 mg via ORAL
  Filled 2018-11-15: qty 2

## 2018-11-15 MED ORDER — CIPROFLOXACIN HCL 250 MG PO TABS
ORAL_TABLET | ORAL | Status: AC
  Administered 2018-11-15: 500 mg via ORAL
  Filled 2018-11-15: qty 2

## 2018-11-15 NOTE — Discharge Instructions (Addendum)
Follow up with you md next week 

## 2018-11-15 NOTE — ED Triage Notes (Signed)
Pt states that he is weak when I took his temp it was 101.8. he states that it burns when he voids. He denies any other s/s

## 2018-11-15 NOTE — ED Provider Notes (Signed)
[facility] EMERGENCY DEPARTMENT Provider Note   CSN: [id number] Arrival date & time: 11/15/18  1546     History   Chief Complaint Chief Complaint  Patient presents with  . Weakness  . Fever    HPI Kevin [name] is a 73 y.o. male.     Patient complains of dysuria and suprapubic abdominal pain  The history is provided by the patient. No language interpreter was used.  Abdominal Pain Pain location:  Suprapubic Pain quality: aching   Pain radiates to:  Does not radiate Pain severity:  Moderate Onset quality:  Sudden Timing:  Constant Progression:  Worsening Chronicity:  New Context: not alcohol use   Relieved by:  Nothing Associated symptoms: dysuria   Associated symptoms: no chest pain, no cough, no diarrhea, no fatigue and no hematuria     Past Medical History:  Diagnosis Date  . CAD (coronary artery disease)   . Cellulitis 5/11   left leg  . Diabetes mellitus   . Diverticulosis   . Diverticulosis   . HTN (hypertension)   . Hyperlipidemia   . Melanoma ([place]) 10/11   left foot  . Melanoma of foot ([place]) 06/02/2011  . Microscopic colitis 10/09/10   Colonoscopy dr [name]  . Obesity   . OSA on CPAP 01/20/2014  . S/P colonoscopy 07/03/03    Dr [name]->hyperplastic rectal polyp  . S/P endoscopy 10/09/10   lap band intact, duodenal erosions  . Seasonal allergies   . Sleep apnea     Patient Active Problem List   Diagnosis Date Noted  . Dyslipidemia 02/22/2015  . Acute coronary syndrome ([place]) 02/14/2015  . NSTEMI (non-ST elevated myocardial infarction) ([place])   . Chest pain 02/13/2015  . OSA on CPAP 01/20/2014  . Accelerating angina ([place]) 07/14/2013  . Edema, lower extremity, bil 07/14/2013  . Coronary artery disease, hx LAD and LCX stenting in 2001 & 2004, last cath 2009 with patent stents. 04/07/2013  . Essential hypertension 04/07/2013  . Hyperlipidemia 04/07/2013  . Diabetes ([place]) 04/07/2013  . Morbid obesity ([place]) 04/07/2013  . Melanoma of foot ([place])  06/02/2011  . Microscopic colitis 10/24/2010  . GI bleed 10/01/2010  . Diarrhea 10/01/2010    Past Surgical History:  Procedure Laterality Date  . APPENDECTOMY    . CARDIAC CATHETERIZATION N/A 02/14/2015   Procedure: Left Heart Cath and Coronary Angiography;  Surgeon: Jettie Booze, MD;  Location: [facility];  Service: Cardiovascular;  Laterality: N/A;  . CARDIAC CATHETERIZATION N/A 02/14/2015   Procedure: Coronary Stent Intervention;  Surgeon: Jettie Booze, MD;  Location: [facility];  Service: Cardiovascular;  Laterality: N/A;  . CORONARY ANGIOPLASTY  2002/2005/2008  . CORONARY ANGIOPLASTY WITH STENT PLACEMENT  06/11/2007   PTCA & stenting distal CX, PTCA & balloon angioplasty in-stent restenotic mid CX coronary artery stent  . KNEE SURGERY     left  . LEFT HEART CATHETERIZATION WITH CORONARY ANGIOGRAM N/A 07/15/2013   Procedure: LEFT HEART CATHETERIZATION WITH CORONARY ANGIOGRAM;  Surgeon: Sinclair Grooms, MD;  Location: [facility] CATH LAB;  Service: Cardiovascular;  Laterality: N/A;  . NM MYOCAR PERF WALL MOTION  06/24/2011   small mild fixed anteroseptal & anteroapical defects.  [name] PENILE PROSTHESIS IMPLANT    . SHOULDER SURGERY     left        Home Medications    Prior to Admission medications   Medication Sig Start Date End Date Taking? Authorizing Provider  Ascorbic Acid (VITAMIN C) 1000  MG tablet Take 1,000 mg by mouth daily.   Yes [provider]  aspirin 81 MG tablet Take 81 mg by mouth daily.   Yes [provider]  clopidogrel (PLAVIX) 75 MG tablet TAKE ONE TABLET BY MOUTH ONCE DAILY. Patient taking differently: Take 75 mg by mouth every morning.  08/12/18  Yes Lorretta Harp, MD  Coenzyme Q10 (CO Q 10) 10 MG CAPS Take 1 capsule by mouth daily.   Yes [provider]  fluticasone (FLONASE) 50 MCG/ACT nasal spray Place 2 sprays into both nostrils daily.   Yes [provider]  furosemide (LASIX) 40 MG tablet Take  40 mg by mouth daily.   Yes [provider]  metoprolol (TOPROL-XL) 100 MG 24 hr tablet Take 100 mg by mouth daily.     Yes [provider]  niacin (NIASPAN) 1000 MG CR tablet TAKE 1 TABLET BY MOUTH AT BEDTIME. Patient taking differently: Take 1,000 mg by mouth at bedtime.  04/09/18  Yes Lorretta Harp, MD  nitroGLYCERIN (NITROSTAT) 0.4 MG SL tablet Place 1 tablet (0.4 mg total) under the tongue every 5 (five) minutes as needed for chest pain. MAX 3 doses. 11/19/15  Yes Lorretta Harp, MD  pravastatin (PRAVACHOL) 80 MG tablet Take 1 tablet (80 mg total) by mouth every evening. Patient taking differently: Take 40 mg by mouth every evening.  10/03/15  Yes Lorretta Harp, MD  RESVERATROL PO Take 1 capsule by mouth every morning.    Yes [provider]  sitaGLIPtin (JANUVIA) 100 MG tablet Take 100 mg by mouth daily.   Yes [provider]  valsartan-hydrochlorothiazide (DIOVAN-HCT) 160-12.5 MG per tablet Take 1 tablet by mouth daily.     Yes [provider]  ciprofloxacin (CIPRO) 500 MG tablet Take 1 tablet (500 mg total) by mouth 2 (two) times daily. One po bid x 7 days 11/15/18   Milton Ferguson, MD    Family History Family History  Problem Relation Age of Onset  . Coronary artery disease Mother   . Diabetes Father   . Liver cancer Brother 43  . Celiac disease Other 35       brother's daughter    Social History Social History   Tobacco Use  . Smoking status: Former Smoker    Packs/day: 3.00    Years: 30.00    Pack years: 90.00    Types: Cigarettes    Start date: 11/18/1964    Quit date: 10/04/1994    Years since quitting: 24.1  . Smokeless tobacco: Never Used  . Tobacco comment: quit smoking 19 years ago  Substance Use Topics  . Alcohol use: Yes    Alcohol/week: 0.0 standard drinks    Comment: 3-4 times/year  . Drug use: No     Allergies   Altace [ramipril], Avapro [irbesartan], Prednisone, Shellfish allergy, Tradjenta  [linagliptin], Cephalexin, Clindamycin hcl, Codeine, Contrast media [iodinated diagnostic agents], Fenofibrate, Glycotrol [carozyme], Indomethacin, Minocycline hcl, Morphine and related, Penicillins, Sulfa drugs cross reactors, Tetracyclines & related, and Tree extract   Review of Systems Review of Systems  Constitutional: Negative for appetite change and fatigue.  HENT: Negative for congestion, ear discharge and sinus pressure.   Eyes: Negative for discharge.  Respiratory: Negative for cough.   Cardiovascular: Negative for chest pain.  Gastrointestinal: Positive for abdominal pain. Negative for diarrhea.  Genitourinary: Positive for dysuria. Negative for frequency and hematuria.  Musculoskeletal: Negative for back pain.  Skin: Negative for rash.  Neurological: Negative for seizures  and headaches.  Psychiatric/Behavioral: Negative for hallucinations.     Physical Exam Updated Vital Signs BP 128/71 (BP Location: Right Arm)   Pulse 88   Temp 98.5 F (36.9 C) (Oral)   Resp 18   Ht 5\' 9"  (1.753 m)   Wt 118.8 kg   SpO2 97%   BMI 38.69 kg/m   Physical Exam Vitals signs and nursing note reviewed.  Constitutional:      Appearance: He is well-developed.  HENT:     Head: Normocephalic.     Nose: Congestion present.  Eyes:     General: No scleral icterus.    Conjunctiva/sclera: Conjunctivae normal.  Neck:     Musculoskeletal: Neck supple.     Thyroid: No thyromegaly.  Cardiovascular:     Rate and Rhythm: Normal rate and regular rhythm.     Heart sounds: No murmur. No friction rub. No gallop.   Pulmonary:     Breath sounds: No stridor. No wheezing or rales.  Chest:     Chest wall: No tenderness.  Abdominal:     General: There is no distension.     Tenderness: There is abdominal tenderness. There is no rebound.  Musculoskeletal: Normal range of motion.  Lymphadenopathy:     Cervical: No cervical adenopathy.  Skin:    Findings: No erythema or rash.  Neurological:      Mental Status: He is oriented to person, place, and time.     Motor: No abnormal muscle tone.     Coordination: Coordination normal.  Psychiatric:        Behavior: Behavior normal.      ED Treatments / Results  Labs (all labs ordered are listed, but only abnormal results are displayed) Labs Reviewed  BASIC METABOLIC PANEL - Abnormal; Notable for the following components:      Result Value   Glucose, Bld 191 (*)    BUN 31 (*)    Creatinine, Ser 1.60 (*)    GFR calc non Af Amer 42 (*)    GFR calc Af Amer 49 (*)    All other components within normal limits  CBC - Abnormal; Notable for the following components:   WBC 12.3 (*)    RBC 3.93 (*)    Hemoglobin 12.7 (*)    HCT 37.9 (*)    All other components within normal limits  URINALYSIS, ROUTINE W REFLEX MICROSCOPIC - Abnormal; Notable for the following components:   APPearance HAZY (*)    Glucose, UA 50 (*)    Leukocytes,Ua SMALL (*)    WBC, UA >50 (*)    Bacteria, UA RARE (*)    All other components within normal limits  URINE CULTURE    EKG None  Radiology No results found.  Procedures Procedures (including critical care time)  Medications Ordered in ED Medications  acetaminophen (TYLENOL) tablet 650 mg (650 mg Oral Given 11/15/18 1820)  sodium chloride 0.9 % bolus 1,000 mL (0 mLs Intravenous Stopped 11/15/18 1950)  ciprofloxacin (CIPRO) tablet 500 mg (500 mg Oral Given 11/15/18 1951)     Initial Impression / Assessment and Plan / ED Course  I have reviewed the triage vital signs and the nursing notes.  Pertinent labs & imaging results that were available during my care of the patient were reviewed by me and considered in my medical decision making (see chart for details).        Labs consistent with urinary tract infection.  Patient placed on Cipro will follow-up with his doctor  Final Clinical Impressions(s) / ED Diagnoses   Final diagnoses:  Acute cystitis with hematuria    ED Discharge Orders          Ordered    ciprofloxacin (CIPRO) 500 MG tablet  2 times daily     11/15/18 2024           Milton Ferguson, MD 11/15/18 2030

## 2018-11-17 LAB — URINE CULTURE: Culture: <10000 — AB

## 2018-11-30 ENCOUNTER — Other Ambulatory Visit: Payer: Self-pay | Admitting: Cardiovascular Disease

## 2018-12-08 ENCOUNTER — Ambulatory Visit: Payer: Self-pay | Admitting: Allergy & Immunology

## 2018-12-22 ENCOUNTER — Ambulatory Visit: Payer: Medicare Other | Admitting: Cardiovascular Disease

## 2019-01-12 ENCOUNTER — Other Ambulatory Visit: Payer: Self-pay

## 2019-01-12 ENCOUNTER — Encounter: Payer: Self-pay | Admitting: Allergy & Immunology

## 2019-01-12 ENCOUNTER — Ambulatory Visit: Payer: Medicare Other | Admitting: Allergy & Immunology

## 2019-01-12 VITALS — BP 122/68 | HR 82 | Temp 97.3°F | Resp 18 | Ht 67.0 in | Wt 258.2 lb

## 2019-01-12 DIAGNOSIS — J310 Chronic rhinitis: Secondary | ICD-10-CM | POA: Diagnosis not present

## 2019-01-12 DIAGNOSIS — T7800XD Anaphylactic reaction due to unspecified food, subsequent encounter: Secondary | ICD-10-CM | POA: Diagnosis not present

## 2019-01-12 DIAGNOSIS — Z889 Allergy status to unspecified drugs, medicaments and biological substances status: Secondary | ICD-10-CM

## 2019-01-12 MED ORDER — EPINEPHRINE 0.3 MG/0.3ML IJ SOAJ: 0.3000 mg | INTRAMUSCULAR | 1 refills | Status: DC | PRN

## 2019-01-12 NOTE — Progress Notes (Signed)
NEW PATIENT  Date of Service/Encounter:  01/12/19  Referring provider: Sharilyn Sites, MD   Assessment:   Anaphylactic shock due to food  Chronic allergic rhinitis   Multiple drug allergies  Plan/Recommendations:   1. Anaphylactic shock due to food - We will get testing to check on your seafood allergies, banana allergies, and peanut allergies. - We will also check on kiwi allergy levels as well.   2. Multiple drug allergies - We need to schedule you for penicillin testing and then a challenge. - Penicillin allergic patients tend to lose their allergy over ten years (90% will tolerate penicillins). - We can do a drug challenge after that. - We do not do testing to other drugs since there eis no evidence that this result tells [place] anything at all.   3. Chronic rhinitis - We will get some allergy testing for environmental allergens in your blood to see if you have evidence of this. - Continue with fluticasone one spray per nostril up to twice daily. - Consider changing to a longer acting antihistamine like cetirizine or levocetirizine.   4. Return in about 3 months (around 04/13/2019) for PENICILLIN TESTING. This can be an in-person, a virtual Webex or a telephone follow up visit.  Subjective:   Kevin [name] is a 73 y.o. male presenting today for evaluation of  Chief Complaint  Patient presents with   Food Intolerance    many allergies to many different things as well as being diabetic. he says that between the many allergies he has and his diabetes that there is not much he can eat. he has had reactions to foods in the past.     Kevin [name] has a history of the following: Patient Active Problem List   Diagnosis Date Noted   Dyslipidemia 02/22/2015   Acute coronary syndrome ([place]) 02/14/2015   NSTEMI (non-ST elevated myocardial infarction) ([place])    Chest pain 02/13/2015   OSA on CPAP 01/20/2014   Accelerating angina ([place]) 07/14/2013   Edema, lower  extremity, bil 07/14/2013   Coronary artery disease, hx LAD and LCX stenting in 2001 & 2004, last cath 2009 with patent stents. 04/07/2013   Essential hypertension 04/07/2013   Hyperlipidemia 04/07/2013   Diabetes ([place]) 04/07/2013   Morbid obesity ([place]) 04/07/2013   Melanoma of foot ([place]) 06/02/2011   Microscopic colitis 10/24/2010   GI bleed 10/01/2010   Diarrhea 10/01/2010    History obtained from: chart review and patient.  Kevin [name] was referred by Sharilyn Sites, MD.     [name] is a 73 y.o. male presenting for an evaluation of foods allergies. He reports that that between his food allergies and diabetes, there is not "much that [he] can eat". He tells me that it has a been a while since he was tested. He was allergic to shellfish and shrimp. He was told to avoid all seafood. He feels that he needs to be tested again to see if there are "other foods that [he] can eat". He has reacted to peanut, shellfish, banana, and plantain. He does eat tree nuts without a problem.   Peanuts - throat swelling and hives. His first reaction was in his early 29s. He did eat peanut butter as a child without a problem.   Plantains/bananas - hives. Again his first reaction was in his early 62s.  Shrimp/shellfish - throat swelling and hives. His first reaction was in her early 29s. He has avoided salmon and tuna.   He does  eat wheat without a problem. He does drink cow's milk. He does need a new EpiPen script. He has never used it himself, but he has had one used on him.   He has multiple antibiotic allergies. He did have anaphylaxis to penicillin in his 28s. He had a sore throat and went to see his PCP. He apparently was given penicillin shot in the clinic and apparently he developed anaphylaxis in the office. Then over time he has developed other antibiotic allergies over time. He did develop photodermatitis to tetracyclines, including swelling and "whelps". He does not use antibiotics often, but  he has always had issues with "bugs" going into his throat and lungs.   He does have a history of environmental allergies that worse from July through December. He does have a nose spray daily and Benadryl. Benadryl does not make him sleepy at all any longer. He has been tested in the past and was on allergy shots for two years, althouhg he is unsure of the time course.   He does have a history of multiple stent placements in his heart. He is on Plavix as well as aspirin as a blood thinner. He has a history of type II DM.   Otherwise, there is no history of other atopic diseases, including asthma, environmental allergies, stinging insect allergies, eczema, urticaria or contact dermatitis. There is no significant infectious history. Vaccinations are up to date.    Past Medical History: Patient Active Problem List   Diagnosis Date Noted   Dyslipidemia 02/22/2015   Acute coronary syndrome ([place]) 02/14/2015   NSTEMI (non-ST elevated myocardial infarction) ([place])    Chest pain 02/13/2015   OSA on CPAP 01/20/2014   Accelerating angina ([place]) 07/14/2013   Edema, lower extremity, bil 07/14/2013   Coronary artery disease, hx LAD and LCX stenting in 2001 & 2004, last cath 2009 with patent stents. 04/07/2013   Essential hypertension 04/07/2013   Hyperlipidemia 04/07/2013   Diabetes ([place]) 04/07/2013   Morbid obesity ([place]) 04/07/2013   Melanoma of foot ([place]) 06/02/2011   Microscopic colitis 10/24/2010   GI bleed 10/01/2010   Diarrhea 10/01/2010    Medication List:  Allergies as of 01/12/2019      Reactions   Altace [ramipril] Shortness Of Breath   Avapro [irbesartan] Shortness Of Breath   Prednisone Anaphylaxis, Swelling, Other (See Comments)   SWELLING OF THE LIPS   Shellfish Allergy Anaphylaxis   Shrimp, selfish   Tradjenta [linagliptin] Shortness Of Breath   Cephalexin Hives, Swelling   Clindamycin Hcl Hives, Swelling   Codeine Nausea And Vomiting, Other (See Comments)    AFFECTS HEART   Contrast Media [iodinated Diagnostic Agents]    Can be administered if Benadryl is administered prior to prevent reaction    Fenofibrate Other (See Comments)   "My skin started peeling off"   Glycotrol [carozyme] Other (See Comments)   unknown   Indomethacin Other (See Comments)   UNKNOWN REACTION   Minocycline Hcl Hives, Swelling   Morphine And Related Nausea And Vomiting   PROJECTILE VOMITING   Penicillins       Sulfa Drugs Cross Reactors Hives, Swelling   Tetracyclines & Related    Tree Extract Itching      Medication List       Accurate as of January 12, 2019 12:53 PM. If you have any questions, ask your nurse or doctor.        STOP taking these medications   ciprofloxacin 500 MG tablet Commonly known as: Cipro  Stopped by: Valentina Shaggy, MD     TAKE these medications   aspirin 81 MG tablet Take 81 mg by mouth daily.   clopidogrel 75 MG tablet Commonly known as: PLAVIX TAKE ONE TABLET BY MOUTH ONCE DAILY. What changed: when to take this   Co Q 10 10 MG Caps Take 1 capsule by mouth daily.   diphenhydrAMINE 25 MG tablet Commonly known as: BENADRYL Take 25 mg by mouth every 6 (six) hours as needed.   EPINEPHrine 0.3 mg/0.3 mL Soaj injection Commonly known as: EpiPen 2-Pak Inject 0.3 mLs (0.3 mg total) into the muscle as needed for anaphylaxis.   fluticasone 50 MCG/ACT nasal spray Commonly known as: FLONASE Place 2 sprays into both nostrils daily.   furosemide 40 MG tablet Commonly known as: LASIX Take 40 mg by mouth daily.   metoprolol succinate 100 MG 24 hr tablet Commonly known as: TOPROL-XL Take 100 mg by mouth daily.   niacin 1000 MG CR tablet Commonly known as: NIASPAN TAKE 1 TABLET BY MOUTH AT BEDTIME.   nitroGLYCERIN 0.4 MG SL tablet Commonly known as: NITROSTAT Place 1 tablet (0.4 mg total) under the tongue every 5 (five) minutes as needed for chest pain. MAX 3 doses.   pravastatin 80 MG tablet Commonly known as:  PRAVACHOL Take 1 tablet (80 mg total) by mouth every evening. What changed: how much to take   RESVERATROL PO Take 1 capsule by mouth every morning.   sitaGLIPtin 100 MG tablet Commonly known as: JANUVIA Take 100 mg by mouth daily.   valsartan-hydrochlorothiazide 160-12.5 MG tablet Commonly known as: DIOVAN-HCT Take 1 tablet by mouth daily.   vitamin C 1000 MG tablet Take 1,000 mg by mouth daily.       Birth History: non-contributory  Developmental History: non-contributory  Past Surgical History: Past Surgical History:  Procedure Laterality Date   ADENOIDECTOMY     APPENDECTOMY     CARDIAC CATHETERIZATION N/A 02/14/2015   Procedure: Left Heart Cath and Coronary Angiography;  Surgeon: Jettie Booze, MD;  Location: [facility];  Service: Cardiovascular;  Laterality: N/A;   CARDIAC CATHETERIZATION N/A 02/14/2015   Procedure: Coronary Stent Intervention;  Surgeon: Jettie Booze, MD;  Location: [facility];  Service: Cardiovascular;  Laterality: N/A;   CORONARY ANGIOPLASTY  2002/2005/2008   CORONARY ANGIOPLASTY WITH STENT PLACEMENT  06/11/2007   PTCA & stenting distal CX, PTCA & balloon angioplasty in-stent restenotic mid CX coronary artery stent   KNEE SURGERY     left   LEFT HEART CATHETERIZATION WITH CORONARY ANGIOGRAM N/A 07/15/2013   Procedure: LEFT HEART CATHETERIZATION WITH CORONARY ANGIOGRAM;  Surgeon: Sinclair Grooms, MD;  Location: [facility];  Service: Cardiovascular;  Laterality: N/A;   NM MYOCAR PERF WALL MOTION  06/24/2011   small mild fixed anteroseptal & anteroapical defects.   PENILE PROSTHESIS IMPLANT     SHOULDER SURGERY     left   SINOSCOPY     TONSILLECTOMY     TYMPANOSTOMY TUBE PLACEMENT       Family History: Family History  Problem Relation Age of Onset   Coronary artery disease Mother    Diabetes Father    Liver cancer Brother 83   Celiac disease Other 72       brother's daughter     Social  History: Tavonte lives at home by himself. He is divorced and has one son who lives in [place]. He lives in a home that is 72  years old. There is gas heating and central cooling. There are no animals inside or outside of the home. There are dust mite coverings on the bedding. There is no tobacco exposure whatsoever. 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     Review of Systems  Constitutional: Negative.  Negative for chills, fever, malaise/fatigue and weight loss.  HENT: Positive for congestion. Negative for ear discharge, ear pain and sore throat.        Positive for postnasal drip.  Eyes: Negative for pain, discharge and redness.  Respiratory: Negative for cough, sputum production, shortness of breath and wheezing.   Cardiovascular: Negative.  Negative for chest pain and palpitations.  Gastrointestinal: Negative for abdominal pain, heartburn, nausea and vomiting.  Skin: Negative.  Negative for itching and rash.  Neurological: Negative for dizziness and headaches.  Endo/Heme/Allergies: Negative for environmental allergies. Does not bruise/bleed easily.       Objective:   Blood pressure 122/68, pulse 82, temperature (!) 97.3 F (36.3 C), temperature source Temporal, resp. rate 18, height 5\' 7"  (1.702 m), weight 258 lb 3.2 oz (117.1 kg), SpO2 97 %. Body mass index is 40.44 kg/m.   Physical Exam:   Physical Exam  Constitutional: He appears well-developed.  Obese male.   HENT:  Head: Normocephalic and atraumatic.  Right Ear: Tympanic membrane, external ear and ear canal normal. No drainage, swelling or tenderness. Tympanic membrane is not injected, not scarred, not erythematous, not retracted and not bulging.  Left Ear: Tympanic membrane, external ear and ear canal normal. No drainage, swelling or tenderness. Tympanic membrane is not injected, not scarred, not erythematous, not retracted and not bulging.  Nose: Mucosal edema present. No rhinorrhea, nasal deformity or septal  deviation. No epistaxis. Right sinus exhibits no maxillary sinus tenderness and no frontal sinus tenderness. Left sinus exhibits no maxillary sinus tenderness and no frontal sinus tenderness.  Mouth/Throat: Uvula is midline and oropharynx is clear and moist. Mucous membranes are not pale and not dry.  Eyes: Pupils are equal, round, and reactive to light. Conjunctivae and EOM are normal. Right eye exhibits no chemosis and no discharge. Left eye exhibits no chemosis and no discharge. Right conjunctiva is not injected. Left conjunctiva is not injected.  Cardiovascular: Normal rate, regular rhythm and normal heart sounds.  Respiratory: Effort normal and breath sounds normal. No accessory muscle usage. No tachypnea. No respiratory distress. He has no wheezes. He has no rhonchi. He has no rales. He exhibits no tenderness.  Moving air well in all lung fields.   GI: There is no abdominal tenderness. There is no rebound and no guarding.  Lymphadenopathy:       Head (right side): No submandibular, no tonsillar and no occipital adenopathy present.       Head (left side): No submandibular, no tonsillar and no occipital adenopathy present.    He has no cervical adenopathy.  Neurological: He is alert.  Skin: No abrasion, no petechiae and no rash noted. Rash is not papular, not vesicular and not urticarial. No erythema. No pallor.  Multiple bruises noted on his upper extremities.   Psychiatric: He has a normal mood and affect.     Diagnostic studies: labs sent instead      Salvatore Marvel, MD [facility]

## 2019-01-12 NOTE — Patient Instructions (Addendum)
1. Anaphylactic shock due to food - We will get testing to check on your seafood allergies, banana allergies, and peanut allergies. - We will also check on kiwi allergy levels as well.   2. Multiple drug allergies - We need to schedule you for penicillin testing and then a challenge. - Penicillin allergic patients tend to lose their allergy over ten years (90% will tolerate penicillins). - We can do a drug challenge after that. - We do not do testing to other drugs since there eis no evidence that this result tells [place] anything at all.   3. Chronic rhinitis - We will get some allergy testing for environmental allergens in your blood to see if you have evidence of this. - Continue with fluticasone one spray per nostril up to twice daily. - Consider changing to a longer acting antihistamine like cetirizine or levocetirizine.   4. Return in about 3 months (around 04/13/2019) for PENICILLIN TESTING. This can be an in-person, a virtual Webex or a telephone follow up visit.   Please inform us of any Emergency Department visits, hospitalizations, or changes in symptoms. Call us before going to the ED for breathing or allergy symptoms since we might be able to fit you in for a sick visit. Feel free to contact us anytime with any questions, problems, or concerns.  It was a pleasure to meet you today!  Websites that have reliable patient information: 1. American Academy of Asthma, Allergy, and Immunology: www.aaaai.org 2. Food Allergy Research and Education (FARE): foodallergy.org 3. Mothers of Asthmatics: http://www.asthmacommunitynetwork.org 4. American College of Allergy, Asthma, and Immunology: www.acaai.org  "Like" [place] on Facebook and Instagram for our latest updates!      Make sure you are registered to vote! If you have moved or changed any of your contact information, you will need to get this updated before voting!  In some cases, you MAY be able to register to vote online:  [email or web address]    Voter ID laws are NOT going into effect for the General Election in November 2020! DO NOT let this stop you from exercising your right to vote!   Absentee voting is the SAFEST way to vote during the coronavirus pandemic!   Download and print an absentee ballot request form at [email or web address] or you can scan the QR code below with your smart phone:      More information on absentee ballots can be found here: [email or web address]

## 2019-01-15 LAB — IGE+ALLERGENS ZONE 2(30)
Alternaria Alternata IgE: <0.1 kU/L
Amer Sycamore IgE Qn: <0.1 kU/L
Aspergillus Fumigatus IgE: <0.1 kU/L
Bahia Grass IgE: <0.1 kU/L
Bermuda Grass IgE: <0.1 kU/L
Cat Dander IgE: <0.1 kU/L
Cedar, Mountain IgE: 0.14 kU/L — AB
Cladosporium Herbarum IgE: <0.1 kU/L
Cockroach, American IgE: 0.13 kU/L — AB
Common Silver Birch IgE: <0.1 kU/L
D Farinae IgE: 0.69 kU/L — AB
D Pteronyssinus IgE: 0.59 kU/L — AB
Dog Dander IgE: <0.1 kU/L
Elm, American IgE: <0.1 kU/L
Hickory, White IgE: 0.11 kU/L — AB
IgE (Immunoglobulin E), Serum: 236 IU/mL (ref 6–495)
Johnson Grass IgE: <0.1 kU/L
Maple/Box Elder IgE: <0.1 kU/L
Mucor Racemosus IgE: <0.1 kU/L
Mugwort IgE Qn: <0.1 kU/L
Nettle IgE: <0.1 kU/L
Oak, White IgE: 0.11 kU/L — AB
Penicillium Chrysogen IgE: <0.1 kU/L
Pigweed, Rough IgE: <0.1 kU/L
Plantain, English IgE: 0.11 kU/L — AB
Ragweed, Short IgE: 0.13 kU/L — AB
Sheep Sorrel IgE Qn: <0.1 kU/L
Stemphylium Herbarum IgE: <0.1 kU/L
Sweet gum IgE RAST Ql: <0.1 kU/L
Timothy Grass IgE: <0.1 kU/L
White Mulberry IgE: <0.1 kU/L

## 2019-01-15 LAB — ALLERGY PANEL 19, SEAFOOD GROUP
Allergen Salmon IgE: <0.1 kU/L
Catfish: <0.1 kU/L
Codfish IgE: <0.1 kU/L
F023-IgE Crab: 0.1 kU/L — AB
F080-IgE Lobster: <0.1 kU/L
Shrimp IgE: 0.19 kU/L — AB
Tuna: <0.1 kU/L

## 2019-01-15 LAB — ALLERGY PANEL 18, NUT MIX GROUP
Allergen Coconut IgE: <0.1 kU/L
F020-IgE Almond: <0.1 kU/L
F202-IgE Cashew Nut: <0.1 kU/L
Hazelnut (Filbert) IgE: 0.12 kU/L — AB
Peanut IgE: <0.1 kU/L
Pecan Nut IgE: <0.1 kU/L
Sesame Seed IgE: <0.1 kU/L

## 2019-01-15 LAB — IGE PEANUT COMPONENT PROFILE
F352-IgE Ara h 8: <0.1 kU/L
F422-IgE Ara h 1: <0.1 kU/L
F423-IgE Ara h 2: <0.1 kU/L
F424-IgE Ara h 3: <0.1 kU/L
F427-IgE Ara h 9: <0.1 kU/L
F447-IgE Ara h 6: <0.1 kU/L

## 2019-01-15 LAB — ALLERGEN PROFILE, MOLD
Aureobasidi Pullulans IgE: <0.1 kU/L
Candida Albicans IgE: <0.1 kU/L
M009-IgE Fusarium proliferatum: <0.1 kU/L
M014-IgE Epicoccum purpur: <0.1 kU/L
Phoma Betae IgE: <0.1 kU/L
Setomelanomma Rostrat: <0.1 kU/L

## 2019-01-15 LAB — ALLERGEN, BRAZIL NUT, F18: Brazil Nut IgE: <0.1 kU/L

## 2019-01-15 LAB — ALLERGEN, KIWI FRUIT, F84: Kiwi Fruit: <0.1 kU/L

## 2019-01-15 LAB — ALLERGEN BANANA: Allergen Banana IgE: <0.1 kU/L

## 2019-01-15 LAB — ALLERGEN WALNUT F256: Walnut IgE: <0.1 kU/L

## 2019-01-20 ENCOUNTER — Telehealth: Payer: Self-pay | Admitting: Cardiovascular Disease

## 2019-01-20 NOTE — Telephone Encounter (Signed)
LVM for patient to call and schedule 1 yr followup with Dr. Berry. 

## 2019-02-08 ENCOUNTER — Other Ambulatory Visit: Payer: Self-pay | Admitting: Cardiovascular Disease

## 2019-02-16 ENCOUNTER — Encounter: Payer: Medicare Other | Admitting: Allergy & Immunology

## 2019-02-25 ENCOUNTER — Other Ambulatory Visit: Payer: Self-pay

## 2019-02-25 ENCOUNTER — Encounter: Payer: Self-pay | Admitting: Cardiovascular Disease

## 2019-02-25 ENCOUNTER — Ambulatory Visit: Payer: Medicare Other | Admitting: Cardiovascular Disease

## 2019-02-25 VITALS — BP 144/89 | HR 66 | Temp 97.5°F | Ht 69.0 in | Wt 247.6 lb

## 2019-02-25 DIAGNOSIS — Z008 Encounter for other general examination: Secondary | ICD-10-CM

## 2019-02-25 DIAGNOSIS — I10 Essential (primary) hypertension: Secondary | ICD-10-CM

## 2019-02-25 DIAGNOSIS — Z9989 Dependence on other enabling machines and devices: Secondary | ICD-10-CM

## 2019-02-25 DIAGNOSIS — E782 Mixed hyperlipidemia: Secondary | ICD-10-CM

## 2019-02-25 DIAGNOSIS — I2510 Atherosclerotic heart disease of native coronary artery without angina pectoris: Secondary | ICD-10-CM

## 2019-02-25 DIAGNOSIS — G4733 Obstructive sleep apnea (adult) (pediatric): Secondary | ICD-10-CM

## 2019-02-25 NOTE — Assessment & Plan Note (Signed)
History of CAD status post remote LAD and circumflex stenting in 2001 2004 by Drs. Olevia Perches and Downey respectively.  He has had multiple heart heart catheterization 6 that time most recently by Dr. [name] 02/14/2015 when he noted diffuse "in-stent restenosis within the LAD stent and 95% distal edge stenosis which was restented with a 3.5 mm x 20 mm long Synergy drug-eluting stent.  He did have ostial diagonal branch stenosis which was jailed and 75% first obtuse marginal branch stenosis as well as an occluded nondominant RCA.  He denies chest pain or shortness of breath.

## 2019-02-25 NOTE — Assessment & Plan Note (Signed)
History of obstructive sleep apnea on CPAP. 

## 2019-02-25 NOTE — Patient Instructions (Signed)

## 2019-02-25 NOTE — Assessment & Plan Note (Signed)
History of hyperlipidemia on pravastatin 40 mg a day with lipid profile performed 01/19/2019 revealing total cholesterol of 141, LDL of 73 and HDL 36.

## 2019-02-25 NOTE — Progress Notes (Signed)
02/25/2019 Kevin [name]   07-29-1945  [id number]  Primary Physician Sharilyn Sites, MD Primary Cardiologist: Lorretta Harp MD FACP, [place], [place], [place]  HPI:  Kevin [name] is a 73 y.o.  severely overweight divorced Caucasian male, father of 1 child, who I last saw in the office  10/07/2017. 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 He has a history of CAD, status post remote LAD and circumflex stenting in 2001 and 2004 respectively by Drs. [name] and [name]. He stopped smoking and drinking at that time as well. His other problems include hypertension, hyperlipidemia, and non-insulin-requiring diabetes. He also has obstructive sleep apnea, on CPAP. He has had lap-banding in the past with ultimate reaccumulation of his weight. He has had endovenous ablation by Dr. Elisabeth Cara of his left greater saphenous vein, which really did not afford significant clinical improvement. He had a functional study performed December 22, 2008, which was nonischemic, and another one June 24, 2011, because of chest pain, which was low risk. He has been asymptomatic.Dr. [name] recently checked his lipid profile last week as an outpatient. He was admitted to [facility] last month which has been shortness of breath. He underwent cardiac catheterization by Dr. Jeralene Peters Smith's revealing patent LAD and circumflex stents and a left dominant system with an occluded nondominant RCA. Medical therapy was recommended. He experimented with stopping his newly added diabetes medicines one of which apparently was contributing to his symptoms. He feels clinically improved after stopping this medication. Since I saw him in March 2016 he was admitted with unstable angina on 02/14/15 and underwent cardiac catheterization by Dr. [name] His LAD stent had diffuse in-stent restenosis with a 95% distal edge stenosis which was restented using a 3.5 x 28 mm long synergy drug-eluting stent. He  did have an ostial diagonal branch stenosis which was "jailedand a 75% first obtuse marginal branch stenosis as well as an occluded nondominant RCA.   Since I saw him a year ago he is remained stable.  He denies chest pain or shortness of breath.    Current Meds  Medication Sig   Ascorbic Acid (VITAMIN C) 1000 MG tablet Take 1,000 mg by mouth daily.   aspirin 81 MG tablet Take 81 mg by mouth daily.   clopidogrel (PLAVIX) 75 MG tablet TAKE ONE TABLET BY MOUTH ONCE DAILY.   Coenzyme Q10 (CO Q 10) 10 MG CAPS Take 1 capsule by mouth daily.   diphenhydrAMINE (BENADRYL) 25 MG tablet Take 25 mg by mouth every 6 (six) hours as needed.   EPINEPHrine (EPIPEN 2-PAK) 0.3 mg/0.3 mL IJ SOAJ injection Inject 0.3 mLs (0.3 mg total) into the muscle as needed for anaphylaxis.   fluticasone (FLONASE) 50 MCG/ACT nasal spray Place 2 sprays into both nostrils daily.   furosemide (LASIX) 40 MG tablet Take 40 mg by mouth daily.   metoprolol (TOPROL-XL) 100 MG 24 hr tablet Take 100 mg by mouth daily.     niacin (NIASPAN) 1000 MG CR tablet TAKE 1 TABLET BY MOUTH AT BEDTIME.   nitroGLYCERIN (NITROSTAT) 0.4 MG SL tablet Place 1 tablet (0.4 mg total) under the tongue every 5 (five) minutes as needed for chest pain. MAX 3 doses.   pravastatin (PRAVACHOL) 80 MG tablet Take 1 tablet (80 mg total) by mouth every evening. (Patient taking differently: Take 40 mg by mouth every evening. 40 mg only)   RESVERATROL PO Take 1 capsule by  mouth every morning.    sitaGLIPtin (JANUVIA) 100 MG tablet Take 100 mg by mouth daily.   valsartan-hydrochlorothiazide (DIOVAN-HCT) 160-12.5 MG per tablet Take 1 tablet by mouth daily.         Social History   Socioeconomic History   Marital status: Divorced    Spouse name: Not on file   Number of children: 1   Years of education: Not on file   Highest education level: Not on file  Occupational History   Occupation: Leisure centre manager: [employer]     Comment: [place]  Social Designer, fashion/clothing strain: Not on file   Food insecurity    Worry: Not on file    Inability: Not on file   Transportation needs    Medical: Not on file    Non-medical: Not on file  Tobacco Use   Smoking status: Former Smoker    Packs/day: 3.00    Years: 30.00    Pack years: 90.00    Types: Cigarettes    Start date: 11/18/1964    Quit date: 10/04/1994    Years since quitting: 24.4   Smokeless tobacco: Never Used   Tobacco comment: quit smoking 19 years ago  Substance and Sexual Activity   Alcohol use: Yes    Alcohol/week: 0.0 standard drinks    Comment: 3-4 times/year   Drug use: No   Sexual activity: Not Currently  Lifestyle   Physical activity    Days per week: Not on file    Minutes per session: Not on file   Stress: Not on file  Relationships   Social connections    Talks on phone: Not on file    Gets together: Not on file    Attends religious service: Not on file    Active member of club or organization: Not on file    Attends meetings of clubs or organizations: Not on file    Relationship status: Not on file   Intimate partner violence    Fear of current or ex partner: Not on file    Emotionally abused: Not on file    Physically abused: Not on file    Forced sexual activity: Not on file  Other Topics Concern   Not on file  Social History Narrative   Not on file     Review of Systems: General: negative for chills, fever, night sweats or weight changes.  Cardiovascular: negative for chest pain, dyspnea on exertion, edema, orthopnea, palpitations, paroxysmal nocturnal dyspnea or shortness of breath Dermatological: negative for rash Respiratory: negative for cough or wheezing Urologic: negative for hematuria Abdominal: negative for nausea, vomiting, diarrhea, bright red blood per rectum, melena, or hematemesis Neurologic: negative for visual changes, syncope, or dizziness All other systems reviewed and are  otherwise negative except as noted above.    Blood pressure (!) 153/82, pulse 64, temperature (!) 97.5 F (36.4 C), height 5\' 9"  (1.753 m), weight 247 lb 9.6 oz (112.3 kg), SpO2 96 %.  General appearance: alert and no distress Neck: no adenopathy, no carotid bruit, no JVD, supple, symmetrical, trachea midline and thyroid not enlarged, symmetric, no tenderness/mass/nodules Lungs: clear to auscultation bilaterally Heart: regular rate and rhythm, S1, S2 normal, no murmur, click, rub or gallop Extremities: extremities normal, atraumatic, no cyanosis or edema Pulses: 2+ and symmetric Skin: Skin color, texture, turgor normal. No rashes or lesions Neurologic: Alert and oriented X 3, normal strength and tone. Normal symmetric reflexes. Normal coordination and gait  EKG sinus rhythm at 66 without ST or T wave changes.  I personally reviewed this EKG.  ASSESSMENT AND PLAN:   OSA on CPAP History of obstructive sleep apnea on CPAP  Coronary artery disease, hx LAD and LCX stenting in 2001 & 2004, last cath 2009 with patent stents. History of CAD status post remote LAD and circumflex stenting in 2001 2004 by Drs. Olevia Perches and Downey respectively.  He has had multiple heart heart catheterization 6 that time most recently by Dr. [name] 02/14/2015 when he noted diffuse "in-stent restenosis within the LAD stent and 95% distal edge stenosis which was restented with a 3.5 mm x 20 mm long Synergy drug-eluting stent.  He did have ostial diagonal branch stenosis which was jailed and 75% first obtuse marginal branch stenosis as well as an occluded nondominant RCA.  He denies chest pain or shortness of breath.  Essential hypertension History of essential hypertension with blood pressure measured today 153/82.  Repeat blood pressure was 144/89.  He is on metoprolol, valsartan and hydrochlorothiazide.  Hyperlipidemia History of hyperlipidemia on pravastatin 40 mg a day with lipid profile performed 01/19/2019  revealing total cholesterol of 141, LDL of 73 and HDL 36.      Lorretta Harp MD [facility], [place], [facility] 02/25/2019 11:40 AM

## 2019-02-25 NOTE — Assessment & Plan Note (Addendum)
History of essential hypertension with blood pressure measured today 153/82.  Repeat blood pressure was 144/89.  He is on metoprolol, valsartan and hydrochlorothiazide.

## 2019-03-10 ENCOUNTER — Other Ambulatory Visit: Payer: Self-pay | Admitting: Cardiovascular Disease

## 2019-03-21 ENCOUNTER — Other Ambulatory Visit: Payer: Self-pay | Admitting: Cardiovascular Disease

## 2019-03-22 ENCOUNTER — Other Ambulatory Visit: Payer: Self-pay | Admitting: Cardiovascular Disease

## 2019-03-22 MED ORDER — NIACIN ER (ANTIHYPERLIPIDEMIC) 1000 MG PO TBCR: 1000.0000 mg | EXTENDED_RELEASE_TABLET | Freq: Every day | ORAL | 0 refills | Status: DC

## 2019-03-22 NOTE — Telephone Encounter (Signed)
This is Dr. Berry's pt 

## 2019-04-13 ENCOUNTER — Encounter: Payer: Medicare Other | Admitting: Allergy & Immunology

## 2019-04-18 ENCOUNTER — Other Ambulatory Visit: Payer: Self-pay

## 2019-04-18 DIAGNOSIS — Z79899 Other long term (current) drug therapy: Secondary | ICD-10-CM

## 2019-04-18 DIAGNOSIS — E785 Hyperlipidemia, unspecified: Secondary | ICD-10-CM

## 2019-04-18 DIAGNOSIS — I10 Essential (primary) hypertension: Secondary | ICD-10-CM

## 2019-04-18 MED ORDER — PRAVASTATIN SODIUM 40 MG PO TABS: 40.0000 mg | ORAL_TABLET | Freq: Every evening | ORAL | 3 refills | Status: AC

## 2019-04-18 NOTE — Telephone Encounter (Signed)
Lm to call back ./cy 

## 2019-04-18 NOTE — Telephone Encounter (Signed)
Pt states he has been trying to get a refill on his Pravastatin. Pt has not had a refill on this medication since 2017. Should this pt still be taking this medication? Please advise.

## 2019-05-16 DIAGNOSIS — Z6841 Body Mass Index (BMI) 40.0 and over, adult: Secondary | ICD-10-CM | POA: Diagnosis not present

## 2019-05-16 DIAGNOSIS — Z1389 Encounter for screening for other disorder: Secondary | ICD-10-CM | POA: Diagnosis not present

## 2019-05-16 DIAGNOSIS — E1129 Type 2 diabetes mellitus with other diabetic kidney complication: Secondary | ICD-10-CM | POA: Diagnosis not present

## 2019-05-16 DIAGNOSIS — Z0001 Encounter for general adult medical examination with abnormal findings: Secondary | ICD-10-CM | POA: Diagnosis not present

## 2019-05-16 DIAGNOSIS — Z23 Encounter for immunization: Secondary | ICD-10-CM | POA: Diagnosis not present

## 2019-05-20 ENCOUNTER — Encounter: Payer: Self-pay | Admitting: Allergy & Immunology

## 2019-05-20 ENCOUNTER — Ambulatory Visit: Payer: Medicare PPO | Admitting: Allergy & Immunology

## 2019-05-20 ENCOUNTER — Other Ambulatory Visit: Payer: Self-pay

## 2019-05-20 VITALS — BP 169/86 | HR 72 | Temp 97.9°F | Resp 18

## 2019-05-20 DIAGNOSIS — J310 Chronic rhinitis: Secondary | ICD-10-CM | POA: Diagnosis not present

## 2019-05-20 DIAGNOSIS — T50905D Adverse effect of unspecified drugs, medicaments and biological substances, subsequent encounter: Secondary | ICD-10-CM

## 2019-05-20 DIAGNOSIS — Z889 Allergy status to unspecified drugs, medicaments and biological substances status: Secondary | ICD-10-CM

## 2019-05-20 NOTE — Patient Instructions (Addendum)
1. Anaphylactic shock due to food - Schedule a seafood challenge and peanut challenge.  - Levels were VERY low/absent.   2. Multiple drug allergies - Penicillin testing was completely negative. - This bodes well for your penicillin challenge. - Make an appointment for a penicillin challenge on your way out.   3. Chronic rhinitis (dust mites, roach, trees, ragweed, and weeds) - Continue with fluticasone one spray per nostril up to twice daily. - Continue with cetirizine 10mg  daily.  4. Return for PENICILLIN CHALLENGE.    Please inform us of any Emergency Department visits, hospitalizations, or changes in symptoms. Call us before going to the ED for breathing or allergy symptoms since we might be able to fit you in for a sick visit. Feel free to contact us anytime with any questions, problems, or concerns.  It was a pleasure to see you again today!  Websites that have reliable patient information: 1. American Academy of Asthma, Allergy, and Immunology: www.aaaai.org 2. Food Allergy Research and Education (FARE): foodallergy.org 3. Mothers of Asthmatics: http://www.asthmacommunitynetwork.org 4. American College of Allergy, Asthma, and Immunology: www.acaai.org   COVID-19 Vaccine Information can be found at: ShippingScam.[email or web address].uk For questions related to vaccine distribution or appointments, please email [email or web address] or call [phone].     "Like" [place] on Facebook and Instagram for our latest updates!        Make sure you are registered to vote! If you have moved or changed any of your contact information, you will need to get this updated before voting!  In some cases, you MAY be able to register to vote online: [email or web address]

## 2019-05-20 NOTE — Progress Notes (Signed)
FOLLOW UP  Date of Service/Encounter:  05/20/19   Assessment:   Adverse drug effect (penicillin) - with negative skin testing today  Plan/Recommendations:   1. Anaphylactic shock due to food - Schedule a seafood challenge and peanut challenge.  - Levels were VERY low/absent.   2. Multiple drug allergies - Penicillin testing was completely negative. - This bodes well for your penicillin challenge. - Make an appointment for a penicillin challenge on your way out.   3. Chronic rhinitis (dust mites, roach, trees, ragweed, and weeds) - Continue with fluticasone one spray per nostril up to twice daily. - Continue with cetirizine 10mg  daily.  4. Return for PENICILLIN CHALLENGE.    Subjective:   Kevin [name] is a 74 y.o. male presenting today for follow up of  Chief Complaint  Patient presents with  . PCN Challenge    Kevin [name] has a history of the following: Patient Active Problem List   Diagnosis Date Noted  . Dyslipidemia 02/22/2015  . Acute coronary syndrome ([place]) 02/14/2015  . NSTEMI (non-ST elevated myocardial infarction) ([place])   . Chest pain 02/13/2015  . OSA on CPAP 01/20/2014  . Accelerating angina ([place]) 07/14/2013  . Edema, lower extremity, bil 07/14/2013  . Coronary artery disease, hx LAD and LCX stenting in 2001 & 2004, last cath 2009 with patent stents. 04/07/2013  . Essential hypertension 04/07/2013  . Hyperlipidemia 04/07/2013  . Diabetes ([place]) 04/07/2013  . Morbid obesity ([place]) 04/07/2013  . Melanoma of foot ([place]) 06/02/2011  . Microscopic colitis 10/24/2010  . GI bleed 10/01/2010  . Diarrhea 10/01/2010    History obtained from: chart review and patient.  Kevin [name] is a 74 y.o. male presenting for skin testing.  He was last seen in September 2020 for his new patient appointment.  At that time, we obtain lab work to look for evidence of seafood allergy, banana allergy, and peanut allergy.  We also look for peanut allergy.  His seafood panel was  positive only to crab and shrimp but low enough to consider a challenge.  His peanut level was negative and I recommended doing a challenge.  Tree nut was positive only to hazelnut.  Banana was negative.  Kiwi was negative.  We recommended introduction of these at home since they were low risk.  Environmental allergy panel was positive to dust mites, roach, trees, ragweed, and weeds.  He was also concerned with a penicillin allergy, so we recommended that he undergo penicillin allergy testing and then a challenge.  Last visit, he has done well.  He was disappointed not to see his family over the holidays, but tells me he is happy to be alive.  He has stopped all of his antihistamines for the skin testing appointment today.  Otherwise, there have been no changes to his past medical history, surgical history, family history, or social history.    Review of Systems  Constitutional: Negative.  Negative for chills, fever, malaise/fatigue and weight loss.  HENT: Negative.  Negative for congestion, ear discharge and ear pain.   Eyes: Negative for pain, discharge and redness.  Respiratory: Negative for cough, sputum production, shortness of breath and wheezing.   Cardiovascular: Negative.  Negative for chest pain and palpitations.  Gastrointestinal: Negative for abdominal pain, heartburn, nausea and vomiting.  Skin: Negative.  Negative for itching and rash.  Neurological: Negative for dizziness and headaches.  Endo/Heme/Allergies: Negative for environmental allergies. Does not bruise/bleed easily.       Objective:   Blood  pressure (!) 169/86, pulse 72, temperature 97.9 F (36.6 C), temperature source Temporal, resp. rate 18, SpO2 99 %. There is no height or weight on file to calculate BMI.   Physical Exam:   Physical Exam  Constitutional: He appears well-developed.  Very talkative male.   HENT:  Head: Normocephalic and atraumatic.  Right Ear: Tympanic membrane, external ear and ear canal  normal.  Left Ear: Tympanic membrane, external ear and ear canal normal.  Nose: No mucosal edema, rhinorrhea, nasal deformity or septal deviation. No epistaxis. Right sinus exhibits no maxillary sinus tenderness and no frontal sinus tenderness. Left sinus exhibits no maxillary sinus tenderness and no frontal sinus tenderness.  Mouth/Throat: Uvula is midline and oropharynx is clear and moist. Mucous membranes are not pale and not dry.  Somewhat hard of hearing.   Eyes: Pupils are equal, round, and reactive to light. Conjunctivae and EOM are normal. Right eye exhibits no chemosis and no discharge. Left eye exhibits no chemosis and no discharge. Right conjunctiva is not injected. Left conjunctiva is not injected.  Cardiovascular: Normal rate, regular rhythm and normal heart sounds.  Respiratory: Effort normal and breath sounds normal. No accessory muscle usage. No tachypnea. No respiratory distress. He has no wheezes. He has no rhonchi. He has no rales. He exhibits no tenderness.  Moving air well in all lung fields.   Lymphadenopathy:    He has no cervical adenopathy.  Neurological: He is alert.  Skin: No abrasion, no petechiae and no rash noted. Rash is not papular, not vesicular and not urticarial. No erythema. No pallor.  Psychiatric: He has a normal mood and affect.     Diagnostic studies:   Percutaneous Penicillin Testing Control SPT: negative Histamine SPT: 2+ Pre-Pen SPT: negative Pen-G SPT: negative  Intradermal Penicillin Testing Control ID: negative  Pre-Pen ID: negative Pen-G (50 units/mL) ID: negative Pen-G (500 units/mL) ID: negative Pen-G (5000 units/mL) ID: negative  Testing was negative, therefore the patient will proceed with a penicillin challenge at a future visit.    Allergy testing results were read and interpreted by myself, documented by clinical staff.      Salvatore Marvel, MD  [facility]

## 2019-06-22 ENCOUNTER — Encounter: Payer: Self-pay | Admitting: Allergy & Immunology

## 2019-07-10 ENCOUNTER — Other Ambulatory Visit: Payer: Self-pay | Admitting: Cardiovascular Disease

## 2019-07-15 ENCOUNTER — Ambulatory Visit: Payer: Medicare PPO | Admitting: Allergy & Immunology

## 2019-07-15 ENCOUNTER — Encounter: Payer: Self-pay | Admitting: Allergy & Immunology

## 2019-07-15 ENCOUNTER — Other Ambulatory Visit: Payer: Self-pay

## 2019-07-15 VITALS — BP 142/76 | HR 70 | Temp 98.4°F | Resp 18

## 2019-07-15 DIAGNOSIS — T50905D Adverse effect of unspecified drugs, medicaments and biological substances, subsequent encounter: Secondary | ICD-10-CM | POA: Diagnosis not present

## 2019-07-15 NOTE — Progress Notes (Signed)
FOLLOW UP  Date of Service/Encounter:  07/15/19   Assessment:   Adverse drug effect (penicillin) - passed challenge today  Plan/Recommendations:   1. Adverse drug effect - You passed your amoxicillin challenge. - This rules out a penicillin allergy. - We are going to remove this from your list. - Make an appointment for a peanut and seafood challenge on your way out. - I love taking these allergies off your list!   2. Follow up for the above food challenges.    Subjective:   Kevin [name] is a 74 y.o. male presenting today for follow up of  Chief Complaint  Patient presents with  . Food/Drug Challenge    PCN    Kevin [name] has a history of the following: Patient Active Problem List   Diagnosis Date Noted  . Dyslipidemia 02/22/2015  . Acute coronary syndrome ([place]) 02/14/2015  . NSTEMI (non-ST elevated myocardial infarction) ([place])   . Chest pain 02/13/2015  . OSA on CPAP 01/20/2014  . Accelerating angina ([place]) 07/14/2013  . Edema, lower extremity, bil 07/14/2013  . Coronary artery disease, hx LAD and LCX stenting in 2001 & 2004, last cath 2009 with patent stents. 04/07/2013  . Essential hypertension 04/07/2013  . Hyperlipidemia 04/07/2013  . Diabetes ([place]) 04/07/2013  . Morbid obesity ([place]) 04/07/2013  . Melanoma of foot ([place]) 06/02/2011  . Microscopic colitis 10/24/2010  . GI bleed 10/01/2010  . Diarrhea 10/01/2010    History obtained from: chart review and patient.  Kevin [name] is a 74 y.o. male presenting for a drug challenge.  He was last seen in January 2021 for penicillin testing.  He was completely negative to the prick and intradermal testing.  We recommended making a follow-up for penicillin challenge.   Since last visit, he has done well. He is feeling good today without cold like symptoms or a fever.   Otherwise, there have been no changes to his past medical history, surgical history, family history, or social history.    Review of Systems    Constitutional: Negative.  Negative for chills, fever, malaise/fatigue and weight loss.  HENT: Negative.  Negative for congestion, ear discharge and ear pain.   Eyes: Negative for pain, discharge and redness.  Respiratory: Negative for cough, sputum production, shortness of breath and wheezing.   Cardiovascular: Negative.  Negative for chest pain and palpitations.  Gastrointestinal: Negative for abdominal pain, heartburn, nausea and vomiting.  Skin: Negative.  Negative for itching and rash.  Neurological: Negative for dizziness and headaches.  Endo/Heme/Allergies: Negative for environmental allergies. Does not bruise/bleed easily.       Objective:   Blood pressure (!) 142/76, pulse 70, temperature 98.4 F (36.9 C), temperature source Temporal, resp. rate 18, SpO2 97 %. There is no height or weight on file to calculate BMI.   Physical Exam: deferred since this was a challenge appointment only    Patient received serial doses of penicillin: 1% (5mg ), 10% (50mg ), and 90% (450mg ). These were separated by 30 minute intervals with brief physical exams between doses. He was monitored for 60 minutes following the last dose. Patient tolerated the procedure without adverse events.     Oral Challenge - 07/15/19 1100    Challenge Food/Drug  PCN    Lot #  if Applicable  [id number]    Food/Drug provided by  [id number]    Comments  Exp: 08/2020    BP  142/76    Pulse  70    Respirations  18  Lungs  97%    Time  0853    Dose  0.70ml    BP  146/80    Pulse  64    Respirations  18    Lungs  99%    Time  0923    Dose  46ml    Time  1058    Dose  83ml    BP  144/70    Pulse  68    Respirations  20    Lungs  98%            Salvatore Marvel, MD  [facility]

## 2019-07-15 NOTE — Patient Instructions (Signed)
1. Adverse drug effect - You passed your amoxicillin challenge. - This rules out a penicillin allergy. - We are going to remove this from your list. - Make an appointment for a peanut and seafood challenge on your way out. - I love taking these allergies off your list!   2. Follow up for the above food challenges.    Please inform us of any Emergency Department visits, hospitalizations, or changes in symptoms. Call us before going to the ED for breathing or allergy symptoms since we might be able to fit you in for a sick visit. Feel free to contact us anytime with any questions, problems, or concerns.  It was a pleasure to see you again today!  Websites that have reliable patient information: 1. American Academy of Asthma, Allergy, and Immunology: www.aaaai.org 2. Food Allergy Research and Education (FARE): foodallergy.org 3. Mothers of Asthmatics: http://www.asthmacommunitynetwork.org 4. American College of Allergy, Asthma, and Immunology: www.acaai.org   COVID-19 Vaccine Information can be found at: ShippingScam.co.uk For questions related to vaccine distribution or appointments, please email [email or web address] or call [phone].     "Like" [place] on Facebook and Instagram for our latest updates!        Make sure you are registered to vote! If you have moved or changed any of your contact information, you will need to get this updated before voting!  In some cases, you MAY be able to register to vote online: [email or web address]

## 2019-08-14 NOTE — Progress Notes (Deleted)
   [address] [place] [address] Dept: [phone]  FOLLOW UP NOTE  Patient ID: Kevin [name], male    DOB: [birth year]  Age: 74 y.o. MRN: [id number] Date of Office Visit: 08/15/2019  Assessment  Chief Complaint: No chief complaint on file.  HPI Kevin [name]    Drug Allergies:  Allergies  Allergen Reactions  . Altace [Ramipril] Shortness Of Breath  . Avapro [Irbesartan] Shortness Of Breath  . Prednisone Anaphylaxis, Swelling and Other (See Comments)    SWELLING OF THE LIPS  . Shellfish Allergy Anaphylaxis    Shrimp, selfish  . Tradjenta [Linagliptin] Shortness Of Breath  . Cephalexin Hives and Swelling  . Clindamycin Hcl Hives and Swelling  . Codeine Nausea And Vomiting and Other (See Comments)    AFFECTS HEART  . Contrast Media [Iodinated Diagnostic Agents]     Can be administered if Benadryl is administered prior to prevent reaction   . Fenofibrate Other (See Comments)    "My skin started peeling off"  . Glycotrol [Carozyme] Other (See Comments)    unknown  . Indomethacin Other (See Comments)    UNKNOWN REACTION  . Minocycline Hcl Hives and Swelling  . Morphine And Related Nausea And Vomiting    PROJECTILE VOMITING  . Sulfa Drugs Cross Reactors Hives and Swelling  . Tetracyclines & Related   . Tree Extract Itching    Physical Exam: There were no vitals taken for this visit.   Physical Exam  Diagnostics:   Procedure note: {Blank single:19197::"Open graded *** challenge","Open graded *** oral challenge"}: The patient was able to tolerate the challenge today without adverse signs or symptoms. Vital signs were stable throughout the challenge and observation period. He received multiple doses separated by {Blank single:19197::"30 minutes","20 minutes","15 minutes","10 minutes"}, each of which was separated by vitals and a brief physical exam. He received the following doses: lip rub, 1 gm, 2 gm, 4 gm, 8 gm, and 16 gm. He was monitored for 60  minutes following the last dose.   The patient had {Blank single:19197::"negative skin prick test and sIgE tests to ***","negative sIgE tests to ***","negative skin prick tests to ***"} and was able to tolerate the open graded oral challenge today without adverse signs or symptoms. Therefore, he has the same risk of systemic reaction associated with {Blank single:19197::"the consumption of ***"} as the general population.  Assessment and Plan: No diagnosis found.  No orders of the defined types were placed in this encounter.   There are no Patient Instructions on file for this visit.  No follow-ups on file.    Thank you for the opportunity to care for this patient.  Please do not hesitate to contact me with questions.  Gareth Morgan, FNP [facility]

## 2019-08-15 ENCOUNTER — Encounter: Payer: Medicare PPO | Admitting: Family Medicine

## 2019-08-22 ENCOUNTER — Encounter: Payer: Self-pay | Admitting: Family Medicine

## 2019-08-22 ENCOUNTER — Other Ambulatory Visit: Payer: Self-pay

## 2019-08-22 ENCOUNTER — Encounter: Payer: Medicare PPO | Admitting: Family Medicine

## 2019-08-22 ENCOUNTER — Ambulatory Visit: Payer: Medicare PPO | Admitting: Family Medicine

## 2019-08-22 VITALS — BP 138/80 | HR 71 | Temp 97.8°F | Resp 18

## 2019-08-22 DIAGNOSIS — T7800XD Anaphylactic reaction due to unspecified food, subsequent encounter: Secondary | ICD-10-CM | POA: Diagnosis not present

## 2019-08-22 DIAGNOSIS — T7800XA Anaphylactic reaction due to unspecified food, initial encounter: Secondary | ICD-10-CM | POA: Insufficient documentation

## 2019-08-22 NOTE — Patient Instructions (Signed)
Food challenge Kevin [name] was able to tolerate the peanut food challenge today at the office without adverse signs or symptoms of an allergic reaction. Therefore, he has the same risk of systemic reaction associated with the consumption of peanut products as the general population.  - Do not give any peanut products for the next 24 hours. - Monitor for allergic symptoms such as rash, wheezing, diarrhea, swelling, and vomiting for the next 24 hours. If severe symptoms occur, treat with EpiPen injection and call 911. For less severe symptoms treat with Benadryl 50 mg every 4 hours and call the clinic.  - If no allergic symptoms are evident, reintroduce peanut products into the diet, 1-2 servings a day. If you develop an allergic reaction to peanut products, record what was eaten the amount eaten, preparation method, time from ingestion to reaction, and symptoms.  - Continue to avoid peanut shellfish until you complete an in office food challenge.   Call the clinic if this treatment plan is not working well for you  Follow up in 1 week or sooner if needed.

## 2019-08-22 NOTE — Progress Notes (Signed)
[address], [address] [place] [address] Dept: [phone]  FOLLOW UP NOTE  Patient ID: Kevin [name], male    DOB: 12/07/1945  Age: 74 y.o. MRN: [id number] Date of Office Visit: 08/22/2019  Assessment  Chief Complaint: Food/Drug Challenge (Peanut Butter)  HPI JOSIAS SANDNER is a 73 year old male who presents to the clinic for a follow up visit with oral office food challenge to peanut. He reports he is feeling well over all and has not taken any antihistamines for the last 3 days. He reports that he did have an allergic reaction to peanut when he was a child and can not remember all the details of this reaction. He continues to avoid shellfish and carries an EpiPen at all times. His current medications are listed in the chart.    Drug Allergies:  Allergies  Allergen Reactions  . Altace [Ramipril] Shortness Of Breath  . Avapro [Irbesartan] Shortness Of Breath  . Prednisone Anaphylaxis, Swelling and Other (See Comments)    SWELLING OF THE LIPS  . Shellfish Allergy Anaphylaxis    Shrimp, selfish  . Tradjenta [Linagliptin] Shortness Of Breath  . Cephalexin Hives and Swelling  . Clindamycin Hcl Hives and Swelling  . Codeine Nausea And Vomiting and Other (See Comments)    AFFECTS HEART  . Contrast Media [Iodinated Diagnostic Agents]     Can be administered if Benadryl is administered prior to prevent reaction   . Fenofibrate Other (See Comments)    "My skin started peeling off"  . Glycotrol [Carozyme] Other (See Comments)    unknown  . Indomethacin Other (See Comments)    UNKNOWN REACTION  . Minocycline Hcl Hives and Swelling  . Morphine And Related Nausea And Vomiting    PROJECTILE VOMITING  . Sulfa Drugs Cross Reactors Hives and Swelling  . Tetracyclines & Related   . Tree Extract Itching    Physical Exam: BP 138/80 (BP Location: Right Arm, Patient Position: Sitting, Cuff Size: Normal)   Pulse 71   Temp 97.8 F (36.6 C) (Temporal)   Resp 18   SpO2  95%    Physical Exam Vitals reviewed.  Constitutional:      Appearance: Normal appearance.  HENT:     Head: Normocephalic and atraumatic.     Right Ear: Tympanic membrane normal.     Left Ear: Tympanic membrane normal.     Nose:     Comments: Bilateral nares normal. Pharynx normal. Ears normal. Eyes normal.    Mouth/Throat:     Pharynx: Oropharynx is clear.  Eyes:     Conjunctiva/sclera: Conjunctivae normal.  Cardiovascular:     Rate and Rhythm: Normal rate and regular rhythm.     Heart sounds: Normal heart sounds. No murmur.  Pulmonary:     Effort: Pulmonary effort is normal.     Breath sounds: Normal breath sounds.     Comments: Lungs clear to auscultation Musculoskeletal:        General: Normal range of motion.     Cervical back: Normal range of motion and neck supple.  Skin:    General: Skin is warm and dry.  Neurological:     Mental Status: He is alert and oriented to person, place, and time.  Psychiatric:        Mood and Affect: Mood normal.        Behavior: Behavior normal.        Thought Content: Thought content normal.        Judgment:  Judgment normal.      Procedure note: Open graded peanut oral challenge: The patient was able to tolerate the challenge today without adverse signs or symptoms. Vital signs were stable throughout the challenge and observation period. He received multiple doses separated by 20 minutes, each of which was separated by vitals and a brief physical exam. He received the following doses: lip rub, 1 gm, 2 gm, 4 gm, 8 gm, and 16 gm. He was monitored for 60 minutes following the last dose.   The patient had negative sIgE tests to peanut components and was able to tolerate the open graded oral challenge today without adverse signs or symptoms. Therefore, he has the same risk of systemic reaction associated with the consumption of peanuts as the general population.  Assessment and Plan: 1. Anaphylactic shock due to food, subsequent encounter        Patient Instructions  Food challenge Noelle Doto was able to tolerate the peanut food challenge today at the office without adverse signs or symptoms of an allergic reaction. Therefore, he has the same risk of systemic reaction associated with the consumption of peanut products as the general population.  - Do not give any peanut products for the next 24 hours. - Monitor for allergic symptoms such as rash, wheezing, diarrhea, swelling, and vomiting for the next 24 hours. If severe symptoms occur, treat with EpiPen injection and call 911. For less severe symptoms treat with Benadryl 50 mg every 4 hours and call the clinic.  - If no allergic symptoms are evident, reintroduce peanut products into the diet, 1-2 servings a day. If you develop an allergic reaction to peanut products, record what was eaten the amount eaten, preparation method, time from ingestion to reaction, and symptoms.  - Continue to avoid peanut shellfish until you complete an in office food challenge.   Call the clinic if this treatment plan is not working well for you  Follow up in 1 week or sooner if needed.    Return if symptoms worsen or fail to improve.    Thank you for the opportunity to care for this patient.  Please do not hesitate to contact me with questions.  Gareth Morgan, FNP [facility]

## 2019-09-05 DIAGNOSIS — E1129 Type 2 diabetes mellitus with other diabetic kidney complication: Secondary | ICD-10-CM | POA: Diagnosis not present

## 2019-09-05 DIAGNOSIS — I2510 Atherosclerotic heart disease of native coronary artery without angina pectoris: Secondary | ICD-10-CM | POA: Diagnosis not present

## 2019-09-05 DIAGNOSIS — Z6841 Body Mass Index (BMI) 40.0 and over, adult: Secondary | ICD-10-CM | POA: Diagnosis not present

## 2019-09-05 DIAGNOSIS — I10 Essential (primary) hypertension: Secondary | ICD-10-CM | POA: Diagnosis not present

## 2019-10-04 ENCOUNTER — Emergency Department (HOSPITAL_COMMUNITY): Payer: Medicare PPO

## 2019-10-04 ENCOUNTER — Emergency Department (HOSPITAL_COMMUNITY)
Admission: EM | Admit: 2019-10-04 | Discharge: 2019-10-04 | Disposition: A | Discharge status: Home / Self Care | Payer: Medicare PPO | Attending: Emergency Medicine | Admitting: Emergency Medicine

## 2019-10-04 ENCOUNTER — Other Ambulatory Visit: Payer: Self-pay

## 2019-10-04 ENCOUNTER — Encounter (HOSPITAL_COMMUNITY): Payer: Self-pay | Admitting: Emergency Medicine

## 2019-10-04 DIAGNOSIS — Z20822 Contact with and (suspected) exposure to covid-19: Secondary | ICD-10-CM | POA: Diagnosis not present

## 2019-10-04 DIAGNOSIS — L03116 Cellulitis of left lower limb: Secondary | ICD-10-CM | POA: Diagnosis not present

## 2019-10-04 DIAGNOSIS — Z7984 Long term (current) use of oral hypoglycemic drugs: Secondary | ICD-10-CM | POA: Insufficient documentation

## 2019-10-04 DIAGNOSIS — Z87891 Personal history of nicotine dependence: Secondary | ICD-10-CM | POA: Diagnosis not present

## 2019-10-04 DIAGNOSIS — R2242 Localized swelling, mass and lump, left lower limb: Secondary | ICD-10-CM | POA: Diagnosis present

## 2019-10-04 DIAGNOSIS — Z7982 Long term (current) use of aspirin: Secondary | ICD-10-CM | POA: Insufficient documentation

## 2019-10-04 DIAGNOSIS — Z23 Encounter for immunization: Secondary | ICD-10-CM | POA: Diagnosis not present

## 2019-10-04 DIAGNOSIS — I252 Old myocardial infarction: Secondary | ICD-10-CM | POA: Diagnosis not present

## 2019-10-04 DIAGNOSIS — Z85820 Personal history of malignant melanoma of skin: Secondary | ICD-10-CM | POA: Diagnosis not present

## 2019-10-04 DIAGNOSIS — I2510 Atherosclerotic heart disease of native coronary artery without angina pectoris: Secondary | ICD-10-CM | POA: Insufficient documentation

## 2019-10-04 DIAGNOSIS — W208XXA Other cause of strike by thrown, projected or falling object, initial encounter: Secondary | ICD-10-CM | POA: Insufficient documentation

## 2019-10-04 DIAGNOSIS — Z03818 Encounter for observation for suspected exposure to other biological agents ruled out: Secondary | ICD-10-CM | POA: Diagnosis not present

## 2019-10-04 DIAGNOSIS — I10 Essential (primary) hypertension: Secondary | ICD-10-CM | POA: Diagnosis not present

## 2019-10-04 DIAGNOSIS — Z79899 Other long term (current) drug therapy: Secondary | ICD-10-CM | POA: Insufficient documentation

## 2019-10-04 DIAGNOSIS — E119 Type 2 diabetes mellitus without complications: Secondary | ICD-10-CM | POA: Insufficient documentation

## 2019-10-04 DIAGNOSIS — L539 Erythematous condition, unspecified: Secondary | ICD-10-CM | POA: Diagnosis not present

## 2019-10-04 DIAGNOSIS — L03119 Cellulitis of unspecified part of limb: Secondary | ICD-10-CM

## 2019-10-04 LAB — COMPREHENSIVE METABOLIC PANEL
ALT: 20 U/L (ref 0–44)
AST: 14 U/L — ABNORMAL LOW (ref 15–41)
Albumin: 4.8 g/dL (ref 3.5–5.0)
Alkaline Phosphatase: 38 U/L (ref 38–126)
Anion gap: 12 (ref 5–15)
BUN: 38 mg/dL — ABNORMAL HIGH (ref 8–23)
CO2: 26 mmol/L (ref 22–32)
Calcium: 10 mg/dL (ref 8.9–10.3)
Chloride: 98 mmol/L (ref 98–111)
Creatinine, Ser: 1.49 mg/dL — ABNORMAL HIGH (ref 0.61–1.24)
GFR calc Af Amer: 53 mL/min — ABNORMAL LOW (ref >60)
GFR calc non Af Amer: 46 mL/min — ABNORMAL LOW (ref >60)
Glucose, Bld: 183 mg/dL — ABNORMAL HIGH (ref 70–99)
Potassium: 3.7 mmol/L (ref 3.5–5.1)
Sodium: 136 mmol/L (ref 135–145)
Total Bilirubin: 0.6 mg/dL (ref 0.3–1.2)
Total Protein: 8.1 g/dL (ref 6.5–8.1)

## 2019-10-04 LAB — CBC WITH DIFFERENTIAL/PLATELET
Abs Immature Granulocytes: 0.01 10*3/uL (ref 0.00–0.07)
Basophils Absolute: 0.1 10*3/uL (ref 0.0–0.1)
Basophils Relative: 1 %
Eosinophils Absolute: 0.2 10*3/uL (ref 0.0–0.5)
Eosinophils Relative: 4 %
HCT: 40.7 % (ref 39.0–52.0)
Hemoglobin: 13.8 g/dL (ref 13.0–17.0)
Immature Granulocytes: 0 %
Lymphocytes Relative: 32 %
Lymphs Abs: 1.7 10*3/uL (ref 0.7–4.0)
MCH: 32.6 pg (ref 26.0–34.0)
MCHC: 33.9 g/dL (ref 30.0–36.0)
MCV: 96.2 fL (ref 80.0–100.0)
Monocytes Absolute: 0.5 10*3/uL (ref 0.1–1.0)
Monocytes Relative: 9 %
Neutro Abs: 2.9 10*3/uL (ref 1.7–7.7)
Neutrophils Relative %: 54 %
Platelets: 199 10*3/uL (ref 150–400)
RBC: 4.23 MIL/uL (ref 4.22–5.81)
RDW: 13.1 % (ref 11.5–15.5)
WBC: 5.3 10*3/uL (ref 4.0–10.5)
nRBC: 0 % (ref 0.0–0.2)

## 2019-10-04 LAB — SARS CORONAVIRUS 2 BY RT PCR (HOSPITAL ORDER, PERFORMED IN ~~LOC~~ HOSPITAL LAB): SARS Coronavirus 2: NEGATIVE

## 2019-10-04 MED ORDER — TETANUS-DIPHTH-ACELL PERTUSSIS 5-2.5-18.5 LF-MCG/0.5 IM SUSP
0.5000 mL | Freq: Once | INTRAMUSCULAR | Status: AC
  Administered 2019-10-04: 0.5 mL via INTRAMUSCULAR
  Filled 2019-10-04: qty 0.5

## 2019-10-04 MED ORDER — AMOXICILLIN-POT CLAVULANATE 875-125 MG PO TABS: 1.0000 | ORAL_TABLET | Freq: Two times a day (BID) | ORAL | 0 refills | Status: AC

## 2019-10-04 MED ORDER — AMOXICILLIN-POT CLAVULANATE 875-125 MG PO TABS
1.0000 | ORAL_TABLET | Freq: Once | ORAL | Status: AC
  Administered 2019-10-04: 1 via ORAL
  Filled 2019-10-04: qty 1

## 2019-10-04 NOTE — ED Provider Notes (Signed)
[facility] EMERGENCY DEPARTMENT Provider Note   CSN: [id number] Arrival date & time: 10/04/19  1553     History Chief Complaint  Patient presents with  . Foot Injury    left    Kevin [name] is a 74 y.o. male with a past medical history of DM, hypertension, melanoma of left foot status post resection, psoriasis, CAD, obesity, who presents today for evaluation of swelling and pain along with discoloration in the left foot.  He reports that about 2 weeks ago he dropped a wooden pole on his foot.  He had mild swelling then.  He has since been to [facility] and states that his foot hurts slightly however not enough to keep him from walking.  Over the past 2 to 3 days he has noted a significant increase in erythema and induration with worsening discoloration of his toes.  He denies any fevers.  He denies any pain in the leg.  He states that that leg he had cellulitis in many years ago and since then has been slightly discolored compared to the right leg.    HPI     Past Medical History:  Diagnosis Date  . Angio-edema   . Asthma    as a child. no problems now.   [name] CAD (coronary artery disease)   . Cellulitis 5/11   left leg  . Diabetes mellitus   . Diverticulosis   . Diverticulosis   . HTN (hypertension)   . Hyperlipidemia   . Melanoma ([place]) 10/11   left foot  . Melanoma of foot ([place]) 06/02/2011  . Microscopic colitis 10/09/10   Colonoscopy dr [name]  . Obesity   . OSA on CPAP 01/20/2014  . Psoriasis   . S/P colonoscopy 07/03/03    Dr [name]->hyperplastic rectal polyp  . S/P endoscopy 10/09/10   lap band intact, duodenal erosions  . Seasonal allergies   . Sleep apnea   . Urticaria     Patient Active Problem List   Diagnosis Date Noted  . Anaphylactic shock due to adverse food reaction 08/22/2019  . Dyslipidemia 02/22/2015  . Acute coronary syndrome ([place]) 02/14/2015  . NSTEMI (non-ST elevated myocardial infarction) ([place])   . Chest pain 02/13/2015  . OSA on CPAP 01/20/2014  .  Accelerating angina ([place]) 07/14/2013  . Edema, lower extremity, bil 07/14/2013  . Coronary artery disease, hx LAD and LCX stenting in 2001 & 2004, last cath 2009 with patent stents. 04/07/2013  . Essential hypertension 04/07/2013  . Hyperlipidemia 04/07/2013  . Diabetes ([place]) 04/07/2013  . Morbid obesity ([place]) 04/07/2013  . Melanoma of foot ([place]) 06/02/2011  . Microscopic colitis 10/24/2010  . GI bleed 10/01/2010  . Diarrhea 10/01/2010    Past Surgical History:  Procedure Laterality Date  . ADENOIDECTOMY    . APPENDECTOMY    . CARDIAC CATHETERIZATION N/A 02/14/2015   Procedure: Left Heart Cath and Coronary Angiography;  Surgeon: Jettie Booze, MD;  Location: [place] CV LAB;  Service: Cardiovascular;  Laterality: N/A;  . CARDIAC CATHETERIZATION N/A 02/14/2015   Procedure: Coronary Stent Intervention;  Surgeon: Jettie Booze, MD;  Location: [place] CV LAB;  Service: Cardiovascular;  Laterality: N/A;  . CORONARY ANGIOPLASTY  2002/2005/2008  . CORONARY ANGIOPLASTY WITH STENT PLACEMENT  06/11/2007   PTCA & stenting distal CX, PTCA & balloon angioplasty in-stent restenotic mid CX coronary artery stent  . KNEE SURGERY     left  . LEFT HEART CATHETERIZATION WITH CORONARY ANGIOGRAM N/A 07/15/2013   Procedure:  LEFT HEART CATHETERIZATION WITH CORONARY ANGIOGRAM;  Surgeon: Sinclair Grooms, MD;  Location: [facility] CATH LAB;  Service: Cardiovascular;  Laterality: N/A;  . NM MYOCAR PERF WALL MOTION  06/24/2011   small mild fixed anteroseptal & anteroapical defects.  [name] PENILE PROSTHESIS IMPLANT    . SHOULDER SURGERY     left  . SINOSCOPY    . TONSILLECTOMY    . TYMPANOSTOMY TUBE PLACEMENT         Family History  Problem Relation Age of Onset  . Coronary artery disease Mother   . Diabetes Father   . Liver cancer Brother 3  . Celiac disease Other 22       brother's daughter  . Allergic rhinitis Neg Hx   . Angioedema Neg Hx   . Asthma Neg Hx   . Atopy Neg Hx   . Eczema  Neg Hx   . Immunodeficiency Neg Hx   . Urticaria Neg Hx     Social History   Tobacco Use  . Smoking status: Former Smoker    Packs/day: 3.00    Years: 30.00    Pack years: 90.00    Types: Cigarettes    Start date: 11/18/1964    Quit date: 10/04/1994    Years since quitting: 25.0  . Smokeless tobacco: Never Used  . Tobacco comment: quit smoking 19 years ago  Substance Use Topics  . Alcohol use: Yes    Alcohol/week: 0.0 standard drinks    Comment: 3-4 times/year  . Drug use: No    Home Medications Prior to Admission medications   Medication Sig Start Date End Date Taking? Authorizing Provider  amoxicillin-clavulanate (AUGMENTIN) 875-125 MG tablet Take 1 tablet by mouth every 12 (twelve) hours for 10 days. 10/04/19 10/14/19  Lorin Glass, PA-C  Ascorbic Acid (VITAMIN C) 1000 MG tablet Take 1,000 mg by mouth daily.    [provider]  aspirin 81 MG tablet Take 81 mg by mouth daily.    [provider]  cetirizine (ZYRTEC) 10 MG tablet Take 10 mg by mouth daily.    [provider]  clopidogrel (PLAVIX) 75 MG tablet TAKE ONE TABLET BY MOUTH ONCE DAILY. 03/10/19   Lorretta Harp, MD  Coenzyme Q10 (CO Q 10) 10 MG CAPS Take 1 capsule by mouth daily.    [provider]  diphenhydrAMINE (BENADRYL) 25 MG tablet Take 25 mg by mouth every 6 (six) hours as needed.    [provider]  EPINEPHrine (EPIPEN 2-PAK) 0.3 mg/0.3 mL IJ SOAJ injection Inject 0.3 mLs (0.3 mg total) into the muscle as needed for anaphylaxis. 01/12/19   Valentina Shaggy, MD  fluticasone [facility]) 50 MCG/ACT nasal spray Place 2 sprays into both nostrils daily.    [provider]  furosemide (LASIX) 40 MG tablet Take 40 mg by mouth daily.    [provider]  metoprolol (TOPROL-XL) 100 MG 24 hr tablet Take 100 mg by mouth daily.      [provider]  niacin (NIASPAN) 1000 MG CR tablet TAKE 1 TABLET BY MOUTH AT BEDTIME. 07/11/19   Lorretta Harp,  MD  pravastatin (PRAVACHOL) 40 MG tablet Take 1 tablet (40 mg total) by mouth every evening. 04/18/19   Lorretta Harp, MD  RESVERATROL PO Take 1 capsule by mouth every morning.     [provider]  sitaGLIPtin (JANUVIA) 100 MG tablet Take 100 mg by mouth daily.    [provider]  valsartan-hydrochlorothiazide (DIOVAN-HCT) 160-12.5  MG per tablet Take 1 tablet by mouth daily.      [provider]    Allergies    Altace [ramipril], Avapro [irbesartan], Prednisone, Shellfish allergy, Tradjenta [linagliptin], Cephalexin, Clindamycin hcl, Codeine, Contrast media [iodinated diagnostic agents], Fenofibrate, Glycotrol [carozyme], Indomethacin, Minocycline hcl, Morphine and related, Sulfa drugs cross reactors, Tetracyclines & related, and Tree extract  Review of Systems   Review of Systems  Constitutional: Negative for chills and fever.  HENT: Negative for congestion.   Respiratory: Negative for chest tightness and shortness of breath.   Genitourinary: Negative for dysuria and urgency.  Musculoskeletal: Positive for joint swelling.  Skin: Positive for color change.  All other systems reviewed and are negative.   Physical Exam Updated Vital Signs BP 136/67   Pulse 62   Temp 98.2 F (36.8 C) (Oral)   Resp 17   Ht 5\' 9"  (1.753 m)   Wt 116.1 kg   SpO2 100%   BMI 37.80 kg/m   Physical Exam Vitals and nursing note reviewed.  Constitutional:      General: He is not in acute distress.    Appearance: He is well-developed. He is not diaphoretic.  HENT:     Head: Normocephalic and atraumatic.  Eyes:     General: No scleral icterus.       Right eye: No discharge.        Left eye: No discharge.     Conjunctiva/sclera: Conjunctivae normal.  Cardiovascular:     Rate and Rhythm: Normal rate and regular rhythm.     Pulses: Normal pulses.     Comments:  2+ left dp pulse, unable to palpate PT pulse due to pain Pulmonary:     Effort: Pulmonary effort is normal. No  respiratory distress.     Breath sounds: No stridor.  Abdominal:     General: There is no distension.  Musculoskeletal:        General: No deformity.     Cervical back: Normal range of motion.  Skin:    General: Skin is warm and dry.     Comments: Please see clinical image.  There is significant ecchymosis on the toes of the left foot with generalized edema.  There is a large area of induration with increased warmth and erythema on the dorsum of the left foot.  Neurological:     Mental Status: He is alert.     Sensory: No sensory deficit (Sensation intact to light touch to bilateral feet. ).     Motor: No abnormal muscle tone.  Psychiatric:        Mood and Affect: Mood normal.        Behavior: Behavior normal.         There is noted discoloration and darkening over the left fifth proximal nail.   ED Results / Procedures / Treatments   Labs (all labs ordered are listed, but only abnormal results are displayed) Labs Reviewed  COMPREHENSIVE METABOLIC PANEL - Abnormal; Notable for the following components:      Result Value   Glucose, Bld 183 (*)    BUN 38 (*)    Creatinine, Ser 1.49 (*)    AST 14 (*)    GFR calc non Af Amer 46 (*)    GFR calc Af Amer 53 (*)    All other components within normal limits  SARS CORONAVIRUS 2 BY RT PCR (HOSPITAL ORDER, [facility])  CBC WITH DIFFERENTIAL/PLATELET    EKG None  Radiology  DG Foot Complete Left  Result Date: 10/04/2019 CLINICAL DATA:  Erythema, concern for infection EXAM: LEFT FOOT - COMPLETE 3+ VIEW COMPARISON:  None. FINDINGS: Advanced osteoarthritis at the 1st tarsal metatarsal joint. No acute bony abnormality. Specifically, no fracture, subluxation, or dislocation. No bone destruction to suggest osteomyelitis. Soft tissue swelling along the dorsum of the foot. IMPRESSION: No evidence of osteomyelitis. Electronically Signed   By: Rolm Baptise M.D.   On: 10/04/2019 19:41    Procedures Procedures  (including critical care time)  Medications Ordered in ED Medications  amoxicillin-clavulanate (AUGMENTIN) 875-125 MG per tablet 1 tablet (1 tablet Oral Given 10/04/19 2223)  Tdap (BOOSTRIX) injection 0.5 mL (0.5 mLs Intramuscular Given 10/04/19 2223)    ED Course  I have reviewed the triage vital signs and the nursing notes.  Pertinent labs & imaging results that were available during my care of the patient were reviewed by me and considered in my medical decision making (see chart for details).    MDM Rules/Calculators/A&P                     Patient is a 74 year old man who presents today for evaluation of pain and swelling in his left foot.  About 2 weeks ago he dropped a metal pole along with swelling and bruising and has had redness worsening over the past 2 to 3 days.  On exam he is generally well-appearing.  He has a significant area of erythema on the dorsum of his left foot which was marked with a skin marker.  He has intact pulses and brisk capillary refill.  No drainage or palpable crepitus.  X-rays were obtained without evidence of subcutaneous gas, fractures or other obvious abnormality.  Labs obtained, CBC is unremarkable.  CMP shows elevated creatinine and glucose consistent with his baseline.  He is afebrile, not tachycardic or tachypneic, does not meet Sirs/sepsis criteria.  Covid test was obtained for possible admission which was negative.  I had discussion with patient, and Dr. Laverta Baltimore, about treatment options.  We discussed admission with IV antibiotics versus discharge with p.o. antibiotics, recheck in 2 days and close outpatient follow-up.  Patient elected for trial of p.o. antibiotics.  His tetanus is updated.  While he has Keflex listed as an allergy chart review shows that he has recently been cleared to take penicillins by allergist after he successfully passed a penicillin challenge.  Given patient's multiple allergies including to tetracyclines, Bactrim, and clindamycin there  limited options.  Plan to treat with Augmentin after consultation with pharmacy.  While this does not cover MRSA he does not appear to have a abscess or purulent wound, and chart review shows that he has not had recent positive MRSA screens on admission.   Additionally it is noted that he has discoloration of his left fifth nailbed proximally.  He has a history of melanoma on this foot.  While this may be reactive due to his trauma and suspected cellulitis I recommend that he follow-up with his dermatologist for additional evaluation.  Return precautions were discussed with patient who states their understanding.  At the time of discharge patient denied any unaddressed complaints or concerns.  Patient is agreeable for discharge home.  Note: Portions of this report may have been transcribed using voice recognition software. Every effort was made to ensure accuracy; however, inadvertent computerized transcription errors may be present  Final Clinical Impression(s) / ED Diagnoses Final diagnoses:  Cellulitis of foot    Rx / DC Orders  ED Discharge Orders         Ordered    amoxicillin-clavulanate (AUGMENTIN) 875-125 MG tablet  Every 12 hours     10/04/19 2238           Lorin Glass, [place] 10/05/19 0057    Margette Fast, MD 10/05/19 1343

## 2019-10-04 NOTE — ED Triage Notes (Signed)
Dropped book on left foot 2 weeks ago.  Notice redness and swelling on Friday, Rates pain 3/10.

## 2019-10-04 NOTE — Discharge Instructions (Addendum)
You may have diarrhea from the antibiotics.  It is very important that you continue to take the antibiotics even if you get diarrhea unless a medical professional tells you that you may stop taking them.  If you stop too early the bacteria you are being treated for will become stronger and you may need different, more powerful antibiotics that have more side effects and worsening diarrhea.  Please stay well hydrated and consider probiotics as they may decrease the severity of your diarrhea.    The antibiotic is a penicillin antibiotic.  Please keep your epi-pen with you at all times.  Given your allergist said penicillin is safe for you this should be ok.   If the area of redness is slightly outside the drawn line (able to be covered up by the tip of your small finger) and it is in the first 48 hours and you do not have other concerns then it is ok to continue to monitor.  If it is more than what you can cover up with the tip of your small finger or it is outside the line in over 48 hours or you develop other concerns or symptoms please return to the emergency room.    Please have your dermatologist evaluate the nail on your left small foot to evaluate for melanoma.

## 2019-10-11 DIAGNOSIS — Z85820 Personal history of malignant melanoma of skin: Secondary | ICD-10-CM | POA: Diagnosis not present

## 2019-10-11 DIAGNOSIS — L03116 Cellulitis of left lower limb: Secondary | ICD-10-CM | POA: Diagnosis not present

## 2019-10-11 DIAGNOSIS — Z08 Encounter for follow-up examination after completed treatment for malignant neoplasm: Secondary | ICD-10-CM | POA: Diagnosis not present

## 2019-12-12 DIAGNOSIS — E1165 Type 2 diabetes mellitus with hyperglycemia: Secondary | ICD-10-CM | POA: Diagnosis not present

## 2019-12-12 DIAGNOSIS — Z6839 Body mass index (BMI) 39.0-39.9, adult: Secondary | ICD-10-CM | POA: Diagnosis not present

## 2019-12-12 DIAGNOSIS — I2510 Atherosclerotic heart disease of native coronary artery without angina pectoris: Secondary | ICD-10-CM | POA: Diagnosis not present

## 2019-12-12 DIAGNOSIS — I249 Acute ischemic heart disease, unspecified: Secondary | ICD-10-CM | POA: Diagnosis not present

## 2019-12-12 DIAGNOSIS — E7849 Other hyperlipidemia: Secondary | ICD-10-CM | POA: Diagnosis not present

## 2020-01-16 ENCOUNTER — Emergency Department (HOSPITAL_COMMUNITY)
Admission: EM | Admit: 2020-01-16 | Discharge: 2020-01-16 | Disposition: A | Discharge status: Home / Self Care | Payer: Medicare PPO | Attending: Emergency Medicine | Admitting: Emergency Medicine

## 2020-01-16 ENCOUNTER — Other Ambulatory Visit: Payer: Self-pay

## 2020-01-16 ENCOUNTER — Encounter (HOSPITAL_COMMUNITY): Payer: Self-pay

## 2020-01-16 DIAGNOSIS — E119 Type 2 diabetes mellitus without complications: Secondary | ICD-10-CM | POA: Diagnosis not present

## 2020-01-16 DIAGNOSIS — Z79899 Other long term (current) drug therapy: Secondary | ICD-10-CM | POA: Insufficient documentation

## 2020-01-16 DIAGNOSIS — N3001 Acute cystitis with hematuria: Secondary | ICD-10-CM | POA: Diagnosis not present

## 2020-01-16 DIAGNOSIS — Z9861 Coronary angioplasty status: Secondary | ICD-10-CM | POA: Diagnosis not present

## 2020-01-16 DIAGNOSIS — I119 Hypertensive heart disease without heart failure: Secondary | ICD-10-CM | POA: Diagnosis not present

## 2020-01-16 DIAGNOSIS — I2510 Atherosclerotic heart disease of native coronary artery without angina pectoris: Secondary | ICD-10-CM | POA: Insufficient documentation

## 2020-01-16 DIAGNOSIS — Z7984 Long term (current) use of oral hypoglycemic drugs: Secondary | ICD-10-CM | POA: Diagnosis not present

## 2020-01-16 DIAGNOSIS — R350 Frequency of micturition: Secondary | ICD-10-CM | POA: Diagnosis present

## 2020-01-16 DIAGNOSIS — I10 Essential (primary) hypertension: Secondary | ICD-10-CM | POA: Diagnosis not present

## 2020-01-16 LAB — CBC
HCT: 40.3 % (ref 39.0–52.0)
Hemoglobin: 13.3 g/dL (ref 13.0–17.0)
MCH: 32.4 pg (ref 26.0–34.0)
MCHC: 33 g/dL (ref 30.0–36.0)
MCV: 98.3 fL (ref 80.0–100.0)
Platelets: 156 10*3/uL (ref 150–400)
RBC: 4.1 MIL/uL — ABNORMAL LOW (ref 4.22–5.81)
RDW: 12.9 % (ref 11.5–15.5)
WBC: 11.4 10*3/uL — ABNORMAL HIGH (ref 4.0–10.5)
nRBC: 0 % (ref 0.0–0.2)

## 2020-01-16 LAB — URINALYSIS, ROUTINE W REFLEX MICROSCOPIC
Bacteria, UA: NONE SEEN
Bilirubin Urine: NEGATIVE
Glucose, UA: NEGATIVE mg/dL
Ketones, ur: NEGATIVE mg/dL
Nitrite: NEGATIVE
Protein, ur: NEGATIVE mg/dL
Specific Gravity, Urine: 1.008 (ref 1.005–1.030)
pH: 5 (ref 5.0–8.0)

## 2020-01-16 LAB — CBG MONITORING, ED: Glucose-Capillary: 157 mg/dL — ABNORMAL HIGH (ref 70–99)

## 2020-01-16 LAB — BASIC METABOLIC PANEL
Anion gap: 12 (ref 5–15)
BUN: 43 mg/dL — ABNORMAL HIGH (ref 8–23)
CO2: 26 mmol/L (ref 22–32)
Calcium: 10.2 mg/dL (ref 8.9–10.3)
Chloride: 100 mmol/L (ref 98–111)
Creatinine, Ser: 1.6 mg/dL — ABNORMAL HIGH (ref 0.61–1.24)
GFR calc Af Amer: 49 mL/min — ABNORMAL LOW (ref >60)
GFR calc non Af Amer: 42 mL/min — ABNORMAL LOW (ref >60)
Glucose, Bld: 173 mg/dL — ABNORMAL HIGH (ref 70–99)
Potassium: 4.4 mmol/L (ref 3.5–5.1)
Sodium: 138 mmol/L (ref 135–145)

## 2020-01-16 MED ORDER — NITROFURANTOIN MONOHYD MACRO 100 MG PO CAPS
100.0000 mg | ORAL_CAPSULE | Freq: Once | ORAL | Status: AC
  Administered 2020-01-16: 100 mg via ORAL
  Filled 2020-01-16: qty 1

## 2020-01-16 MED ORDER — PHENAZOPYRIDINE HCL 100 MG PO TABS: 200.0000 mg | ORAL_TABLET | Freq: Once | ORAL | Status: DC

## 2020-01-16 MED ORDER — PHENAZOPYRIDINE HCL 100 MG PO TABS
100.0000 mg | ORAL_TABLET | Freq: Once | ORAL | Status: AC
  Administered 2020-01-16: 100 mg via ORAL
  Filled 2020-01-16: qty 1

## 2020-01-16 MED ORDER — ACETAMINOPHEN 500 MG PO TABS
1000.0000 mg | ORAL_TABLET | Freq: Once | ORAL | Status: AC
  Administered 2020-01-16: 1000 mg via ORAL
  Filled 2020-01-16: qty 2

## 2020-01-16 MED ORDER — PHENAZOPYRIDINE HCL 200 MG PO TABS: 200.0000 mg | ORAL_TABLET | Freq: Three times a day (TID) | ORAL | 0 refills | Status: AC

## 2020-01-16 MED ORDER — NITROFURANTOIN MONOHYD MACRO 100 MG PO CAPS: 100.0000 mg | ORAL_CAPSULE | Freq: Once | ORAL | Status: DC

## 2020-01-16 MED ORDER — NITROFURANTOIN MONOHYD MACRO 100 MG PO CAPS
100.0000 mg | ORAL_CAPSULE | Freq: Two times a day (BID) | ORAL | 0 refills | Status: DC
Start: 2020-01-16 — End: 2020-08-08

## 2020-01-16 NOTE — ED Provider Notes (Signed)
[facility] EMERGENCY DEPARTMENT Provider Note   CSN: [id number] Arrival date & time: 01/16/20  6333     History Chief Complaint  Patient presents with  . Weakness    Kevin [name] is a 74 y.o. male with a history of DM, HTN, CAD and asthma presenting with urinary frequency along with urgency, painful urination and hematuria.  He does report generalized fatigue, denies dizziness, chest pain, sob, palpitations, also no n/v or fevers. No history of kidney stones. He endorses distant h/o uti with prostate involvement and states this feels similar. He has had no treatment prior to arrival for these symptoms.  He endorses drinking plenty of fluids but suggests he feels dehydrated.   HPI     Past Medical History:  Diagnosis Date  . Angio-edema   . Asthma    as a child. no problems now.   [name] CAD (coronary artery disease)   . Cellulitis 5/11   left leg  . Diabetes mellitus   . Diverticulosis   . Diverticulosis   . HTN (hypertension)   . Hyperlipidemia   . Melanoma ([place]) 10/11   left foot  . Melanoma of foot ([place]) 06/02/2011  . Microscopic colitis 10/09/10   Colonoscopy dr [name]  . Obesity   . OSA on CPAP 01/20/2014  . Psoriasis   . S/P colonoscopy 07/03/03    Dr [name]->hyperplastic rectal polyp  . S/P endoscopy 10/09/10   lap band intact, duodenal erosions  . Seasonal allergies   . Sleep apnea   . Urticaria     Patient Active Problem List   Diagnosis Date Noted  . Anaphylactic shock due to adverse food reaction 08/22/2019  . Dyslipidemia 02/22/2015  . Acute coronary syndrome ([place]) 02/14/2015  . NSTEMI (non-ST elevated myocardial infarction) ([place])   . Chest pain 02/13/2015  . OSA on CPAP 01/20/2014  . Accelerating angina ([place]) 07/14/2013  . Edema, lower extremity, bil 07/14/2013  . Coronary artery disease, hx LAD and LCX stenting in 2001 & 2004, last cath 2009 with patent stents. 04/07/2013  . Essential hypertension 04/07/2013  . Hyperlipidemia 04/07/2013  . Diabetes  ([place]) 04/07/2013  . Morbid obesity ([place]) 04/07/2013  . Melanoma of foot ([place]) 06/02/2011  . Microscopic colitis 10/24/2010  . GI bleed 10/01/2010  . Diarrhea 10/01/2010    Past Surgical History:  Procedure Laterality Date  . ADENOIDECTOMY    . APPENDECTOMY    . CARDIAC CATHETERIZATION N/A 02/14/2015   Procedure: Left Heart Cath and Coronary Angiography;  Surgeon: Jettie Booze, MD;  Location: [place] CV LAB;  Service: Cardiovascular;  Laterality: N/A;  . CARDIAC CATHETERIZATION N/A 02/14/2015   Procedure: Coronary Stent Intervention;  Surgeon: Jettie Booze, MD;  Location: [place] [facility];  Service: Cardiovascular;  Laterality: N/A;  . CORONARY ANGIOPLASTY  2002/2005/2008  . CORONARY ANGIOPLASTY WITH STENT PLACEMENT  06/11/2007   PTCA & stenting distal CX, PTCA & balloon angioplasty in-stent restenotic mid CX coronary artery stent  . KNEE SURGERY     left  . LEFT HEART CATHETERIZATION WITH CORONARY ANGIOGRAM N/A 07/15/2013   Procedure: LEFT HEART CATHETERIZATION WITH CORONARY ANGIOGRAM;  Surgeon: Sinclair Grooms, MD;  Location: [facility] CATH LAB;  Service: Cardiovascular;  Laterality: N/A;  . NM MYOCAR PERF WALL MOTION  06/24/2011   small mild fixed anteroseptal & anteroapical defects.  [name] PENILE PROSTHESIS IMPLANT    . SHOULDER SURGERY     left  . SINOSCOPY    . TONSILLECTOMY    .  TYMPANOSTOMY TUBE PLACEMENT         Family History  Problem Relation Age of Onset  . Coronary artery disease Mother   . Diabetes Father   . Liver cancer Brother 66  . Celiac disease Other 85       brother's daughter  . Allergic rhinitis Neg Hx   . Angioedema Neg Hx   . Asthma Neg Hx   . Atopy Neg Hx   . Eczema Neg Hx   . Immunodeficiency Neg Hx   . Urticaria Neg Hx     Social History   Tobacco Use  . Smoking status: Former Smoker    Packs/day: 3.00    Years: 30.00    Pack years: 90.00    Types: Cigarettes    Start date: 11/18/1964    Quit date: 10/04/1994    Years since  quitting: 25.3  . Smokeless tobacco: Never Used  . Tobacco comment: quit smoking 19 years ago  Vaping Use  . Vaping Use: Never used  Substance Use Topics  . Alcohol use: Yes    Alcohol/week: 0.0 standard drinks    Comment: 3-4 times/year  . Drug use: No    Home Medications Prior to Admission medications   Medication Sig Start Date End Date Taking? Authorizing Provider  Ascorbic Acid (VITAMIN C) 1000 MG tablet Take 1,000 mg by mouth daily.    [provider]  aspirin 81 MG tablet Take 81 mg by mouth daily.    [provider]  cetirizine (ZYRTEC) 10 MG tablet Take 10 mg by mouth daily.    [provider]  clopidogrel (PLAVIX) 75 MG tablet TAKE ONE TABLET BY MOUTH ONCE DAILY. 03/10/19   Lorretta Harp, MD  Coenzyme Q10 (CO Q 10) 10 MG CAPS Take 1 capsule by mouth daily.    [provider]  diphenhydrAMINE (BENADRYL) 25 MG tablet Take 25 mg by mouth every 6 (six) hours as needed.    [provider]  EPINEPHrine (EPIPEN 2-PAK) 0.3 mg/0.3 mL IJ SOAJ injection Inject 0.3 mLs (0.3 mg total) into the muscle as needed for anaphylaxis. 01/12/19   Valentina Shaggy, MD  fluticasone [facility]) 50 MCG/ACT nasal spray Place 2 sprays into both nostrils daily.    [provider]  furosemide (LASIX) 40 MG tablet Take 40 mg by mouth daily.    [provider]  metoprolol (TOPROL-XL) 100 MG 24 hr tablet Take 100 mg by mouth daily.      [provider]  niacin (NIASPAN) 1000 MG CR tablet TAKE 1 TABLET BY MOUTH AT BEDTIME. 07/11/19   Lorretta Harp, MD  nitrofurantoin, macrocrystal-monohydrate, (MACROBID) 100 MG capsule Take 1 capsule (100 mg total) by mouth 2 (two) times daily. 01/16/20   Evalee Jefferson, PA-C  phenazopyridine (PYRIDIUM) 200 MG tablet Take 1 tablet (200 mg total) by mouth 3 (three) times daily for 2 days. 01/16/20 01/18/20  Evalee Jefferson, PA-C  pravastatin (PRAVACHOL) 40 MG tablet Take 1 tablet (40 mg total) by mouth every  evening. 04/18/19   Lorretta Harp, MD  RESVERATROL PO Take 1 capsule by mouth every morning.     [provider]  sitaGLIPtin (JANUVIA) 100 MG tablet Take 100 mg by mouth daily.    [provider]  valsartan-hydrochlorothiazide (DIOVAN-HCT) 160-12.5 MG per tablet Take 1 tablet by mouth daily.      [provider]    Allergies    Altace [ramipril], Avapro [irbesartan], Prednisone, Shellfish allergy, Tradjenta [linagliptin], Cephalexin,  Clindamycin hcl, Codeine, Contrast media [iodinated diagnostic agents], Fenofibrate, Glycotrol [carozyme], Indomethacin, Minocycline hcl, Morphine and related, Sulfa drugs cross reactors, Tetracyclines & related, and Tree extract  Review of Systems   Review of Systems  Constitutional: Negative for fever.  HENT: Negative for congestion and sore throat.   Eyes: Negative.   Respiratory: Negative for chest tightness and shortness of breath.   Cardiovascular: Negative for chest pain.  Gastrointestinal: Negative for abdominal pain and nausea.  Genitourinary: Positive for dysuria, frequency, hematuria and urgency. Negative for flank pain.  Musculoskeletal: Negative for arthralgias, joint swelling and neck pain.  Skin: Negative.  Negative for rash and wound.  Neurological: Negative for dizziness, weakness, light-headedness, numbness and headaches.  Psychiatric/Behavioral: Negative.     Physical Exam Updated Vital Signs BP 140/71 (BP Location: Left Arm)   Pulse 92   Temp 98.4 F (36.9 C) (Oral)   Resp 19   Ht 5' 9.5" (1.765 m)   Wt 115.7 kg   SpO2 100%   BMI 37.12 kg/m   Physical Exam Vitals and nursing note reviewed. Exam conducted with a chaperone present.  Constitutional:      Appearance: He is well-developed.  HENT:     Head: Normocephalic and atraumatic.  Eyes:     Conjunctiva/sclera: Conjunctivae normal.  Cardiovascular:     Rate and Rhythm: Normal rate and regular rhythm.     Heart sounds: Normal heart sounds.   Pulmonary:     Effort: Pulmonary effort is normal.     Breath sounds: Normal breath sounds. No wheezing.  Abdominal:     General: Bowel sounds are normal.     Palpations: Abdomen is soft.     Tenderness: There is no abdominal tenderness.  Genitourinary:    Prostate: Normal.     Comments: No prostate tenderness. Musculoskeletal:        General: Normal range of motion.     Cervical back: Normal range of motion.  Skin:    General: Skin is warm and dry.  Neurological:     Mental Status: He is alert.     ED Results / Procedures / Treatments   Labs (all labs ordered are listed, but only abnormal results are displayed) Labs Reviewed  BASIC METABOLIC PANEL - Abnormal; Notable for the following components:      Result Value   Glucose, Bld 173 (*)    BUN 43 (*)    Creatinine, Ser 1.60 (*)    GFR calc non Af Amer 42 (*)    GFR calc Af Amer 49 (*)    All other components within normal limits  CBC - Abnormal; Notable for the following components:   WBC 11.4 (*)    RBC 4.10 (*)    All other components within normal limits  URINALYSIS, ROUTINE W REFLEX MICROSCOPIC - Abnormal; Notable for the following components:   APPearance HAZY (*)    Hgb urine dipstick SMALL (*)    Leukocytes,Ua MODERATE (*)    All other components within normal limits  CBG MONITORING, ED - Abnormal; Notable for the following components:   Glucose-Capillary 157 (*)    All other components within normal limits  URINE CULTURE    EKG EKG Interpretation  Date/Time:  Monday January 16 2020 10:22:32 EDT Ventricular Rate:  91 PR Interval:  152 QRS Duration: 86 QT Interval:  342 QTC Calculation: 420 R Axis:   12 Text Interpretation: Normal sinus rhythm Normal ECG Confirmed by Milton Ferguson [id number]) on 01/16/2020 3:05:13 PM  Radiology No results found.  Procedures Procedures (including critical care time)  Medications Ordered in ED Medications  acetaminophen (TYLENOL) tablet 1,000 mg (1,000 mg Oral  Given 01/16/20 1528)  nitrofurantoin (macrocrystal-monohydrate) (MACROBID) capsule 100 mg (100 mg Oral Given 01/16/20 1529)  phenazopyridine (PYRIDIUM) tablet 100 mg (100 mg Oral Given 01/16/20 1529)    ED Course  I have reviewed the triage vital signs and the nursing notes.  Pertinent labs & imaging results that were available during my care of the patient were reviewed by me and considered in my medical decision making (see chart for details).    MDM Rules/Calculators/A&P                          Labs reviewed and discussed with pt.  Urine cx ordered.  Creatinine is elevated but stable compared to prior lab results.  Discussed IV fluids, but pt prefers PO hydration at this time.  Specific gravity of urine low, BUN/creatinine elevated, but stable.  He was started on macrobid for uti pending cx, he has multiple allergies to other abx classes.  Pyridium also given for sx relief.  Return precautions outlined, otw plan f/u with pcp for recheck for any persistent sx.   Pt seen by Dr. Roderic Palau during this visit.  Final Clinical Impression(s) / ED Diagnoses Final diagnoses:  Acute cystitis with hematuria    Rx / DC Orders ED Discharge Orders         Ordered    nitrofurantoin, macrocrystal-monohydrate, (MACROBID) 100 MG capsule  2 times daily        01/16/20 1521    phenazopyridine (PYRIDIUM) 200 MG tablet  3 times daily        01/16/20 1521           Evalee Jefferson, PA-C 01/16/20 1634    Milton Ferguson, MD 01/18/20 1248

## 2020-01-16 NOTE — ED Triage Notes (Signed)
Pt presents to ED with complaints of generalized weakness, pain with urination and rectal pain since Saturday.

## 2020-01-16 NOTE — Discharge Instructions (Signed)
Take the entire course of the antibiotic prescribed. You may take the pyridium if you desire (this is for urinary pain relief) but remember this will turn your urine bright yellow/orange and is a normal side effect. Make sure you are drinking plenty of fluids and get rechecked by your primary MD if your symptoms are not better with this treatment.

## 2020-01-19 LAB — URINE CULTURE: Culture: >=100000 — AB

## 2020-01-20 ENCOUNTER — Telehealth (HOSPITAL_BASED_OUTPATIENT_CLINIC_OR_DEPARTMENT_OTHER): Payer: Self-pay | Admitting: Emergency Medicine

## 2020-01-20 NOTE — Telephone Encounter (Signed)
Post ED Visit - Positive Culture Follow-up  Culture report reviewed by antimicrobial stewardship pharmacist: [place] Team []  Elenor Quinones, Pharm.D. []  Heide Guile, Pharm.D., BCPS AQ-ID []  Parks Neptune, Pharm.D., BCPS []  Alycia Rossetti, Pharm.D., BCPS []  [place], [place].D., BCPS, AAHIVP []  Legrand Como, Pharm.D., BCPS, AAHIVP [x]  Salome Arnt, PharmD, BCPS []  Johnnette Gourd, PharmD, BCPS []  Hughes Better, PharmD, BCPS []  Leeroy Cha, PharmD []  Laqueta Linden, PharmD, BCPS []  Albertina Parr, PharmD  [place] Team []  Leodis Sias, PharmD []  Lindell Spar, PharmD []  Royetta Asal, PharmD []  Graylin Shiver, Rph []  Rema Fendt) Glennon Mac, PharmD []  Arlyn Dunning, PharmD []  Netta Cedars, PharmD []  Dia Sitter, PharmD []  Leone Haven, PharmD []  Gretta Arab, PharmD []  Theodis Shove, PharmD []  Peggyann Juba, PharmD []  Reuel Boom, PharmD   Positive urine culture Treated with Nitrofurantoin, organism sensitive to the same and no further patient follow-up is required at this time.  Sandi Raveling Chalyn Amescua 01/20/2020, 2:43 PM

## 2020-02-04 ENCOUNTER — Other Ambulatory Visit: Payer: Self-pay | Admitting: Cardiovascular Disease

## 2020-02-06 ENCOUNTER — Other Ambulatory Visit: Payer: Self-pay | Admitting: Cardiovascular Disease

## 2020-02-06 DIAGNOSIS — Z23 Encounter for immunization: Secondary | ICD-10-CM | POA: Diagnosis not present

## 2020-02-06 NOTE — Telephone Encounter (Signed)
*  STAT* If patient is at the pharmacy, call can be transferred to refill team.   1. Which medications need to be refilled? (please list name of each medication and dose if known)  clopidogrel (PLAVIX) 75 MG tablet  2. Which pharmacy/location (including street and city if local pharmacy) is medication to be sent to? [place], [address]  3. Do they need a 30 day or 90 day supply? 90   PT is out of refills and does not have any medication. The patient is scheduled to see Dr.Berry 04/06/20

## 2020-03-05 ENCOUNTER — Encounter (HOSPITAL_COMMUNITY): Payer: Self-pay

## 2020-03-05 ENCOUNTER — Emergency Department (HOSPITAL_COMMUNITY): Payer: Medicare PPO

## 2020-03-05 ENCOUNTER — Emergency Department (HOSPITAL_COMMUNITY)
Admission: EM | Admit: 2020-03-05 | Discharge: 2020-03-05 | Disposition: A | Discharge status: Home / Self Care | Payer: Medicare PPO | Attending: Emergency Medicine | Admitting: Emergency Medicine

## 2020-03-05 ENCOUNTER — Other Ambulatory Visit: Payer: Self-pay

## 2020-03-05 DIAGNOSIS — R1010 Upper abdominal pain, unspecified: Secondary | ICD-10-CM | POA: Diagnosis not present

## 2020-03-05 DIAGNOSIS — Z87891 Personal history of nicotine dependence: Secondary | ICD-10-CM | POA: Insufficient documentation

## 2020-03-05 DIAGNOSIS — Z79899 Other long term (current) drug therapy: Secondary | ICD-10-CM | POA: Diagnosis not present

## 2020-03-05 DIAGNOSIS — K6389 Other specified diseases of intestine: Secondary | ICD-10-CM | POA: Diagnosis not present

## 2020-03-05 DIAGNOSIS — Z7982 Long term (current) use of aspirin: Secondary | ICD-10-CM | POA: Insufficient documentation

## 2020-03-05 DIAGNOSIS — E785 Hyperlipidemia, unspecified: Secondary | ICD-10-CM | POA: Insufficient documentation

## 2020-03-05 DIAGNOSIS — I700 Atherosclerosis of aorta: Secondary | ICD-10-CM | POA: Diagnosis not present

## 2020-03-05 DIAGNOSIS — E1169 Type 2 diabetes mellitus with other specified complication: Secondary | ICD-10-CM | POA: Diagnosis not present

## 2020-03-05 DIAGNOSIS — R1110 Vomiting, unspecified: Secondary | ICD-10-CM | POA: Diagnosis not present

## 2020-03-05 DIAGNOSIS — I2510 Atherosclerotic heart disease of native coronary artery without angina pectoris: Secondary | ICD-10-CM | POA: Diagnosis not present

## 2020-03-05 DIAGNOSIS — Z955 Presence of coronary angioplasty implant and graft: Secondary | ICD-10-CM | POA: Diagnosis not present

## 2020-03-05 DIAGNOSIS — I10 Essential (primary) hypertension: Secondary | ICD-10-CM | POA: Insufficient documentation

## 2020-03-05 DIAGNOSIS — Z85820 Personal history of malignant melanoma of skin: Secondary | ICD-10-CM | POA: Insufficient documentation

## 2020-03-05 DIAGNOSIS — J45909 Unspecified asthma, uncomplicated: Secondary | ICD-10-CM | POA: Diagnosis not present

## 2020-03-05 DIAGNOSIS — N400 Enlarged prostate without lower urinary tract symptoms: Secondary | ICD-10-CM | POA: Diagnosis not present

## 2020-03-05 LAB — CBC
HCT: 44.7 % (ref 39.0–52.0)
Hemoglobin: 14.4 g/dL (ref 13.0–17.0)
MCH: 31.5 pg (ref 26.0–34.0)
MCHC: 32.2 g/dL (ref 30.0–36.0)
MCV: 97.8 fL (ref 80.0–100.0)
Platelets: 192 10*3/uL (ref 150–400)
RBC: 4.57 MIL/uL (ref 4.22–5.81)
RDW: 13.2 % (ref 11.5–15.5)
WBC: 5.2 10*3/uL (ref 4.0–10.5)
nRBC: 0 % (ref 0.0–0.2)

## 2020-03-05 LAB — COMPREHENSIVE METABOLIC PANEL
ALT: 23 U/L (ref 0–44)
AST: 18 U/L (ref 15–41)
Albumin: 4.4 g/dL (ref 3.5–5.0)
Alkaline Phosphatase: 34 U/L — ABNORMAL LOW (ref 38–126)
Anion gap: 12 (ref 5–15)
BUN: 39 mg/dL — ABNORMAL HIGH (ref 8–23)
CO2: 25 mmol/L (ref 22–32)
Calcium: 9.3 mg/dL (ref 8.9–10.3)
Chloride: 104 mmol/L (ref 98–111)
Creatinine, Ser: 1.42 mg/dL — ABNORMAL HIGH (ref 0.61–1.24)
GFR, Estimated: 52 mL/min — ABNORMAL LOW (ref >60)
Glucose, Bld: 158 mg/dL — ABNORMAL HIGH (ref 70–99)
Potassium: 4.3 mmol/L (ref 3.5–5.1)
Sodium: 141 mmol/L (ref 135–145)
Total Bilirubin: 1 mg/dL (ref 0.3–1.2)
Total Protein: 7.6 g/dL (ref 6.5–8.1)

## 2020-03-05 LAB — LIPASE, BLOOD: Lipase: 27 U/L (ref 11–51)

## 2020-03-05 MED ORDER — ONDANSETRON 4 MG PO TBDP: 4.0000 mg | ORAL_TABLET | Freq: Three times a day (TID) | ORAL | 0 refills | Status: DC | PRN

## 2020-03-05 MED ORDER — FENTANYL CITRATE (PF) 100 MCG/2ML IJ SOLN
25.0000 ug | Freq: Once | INTRAMUSCULAR | Status: DC
  Filled 2020-03-05: qty 2

## 2020-03-05 MED ORDER — HYDROCODONE-ACETAMINOPHEN 5-325 MG PO TABS
1.0000 | ORAL_TABLET | Freq: Four times a day (QID) | ORAL | 0 refills | Status: DC | PRN
Start: 2020-03-05 — End: 2020-08-08

## 2020-03-05 MED ORDER — DIPHENHYDRAMINE HCL 50 MG/ML IJ SOLN
25.0000 mg | Freq: Once | INTRAMUSCULAR | Status: AC
  Administered 2020-03-05: 25 mg via INTRAVENOUS
  Filled 2020-03-05: qty 1

## 2020-03-05 MED ORDER — IOHEXOL 300 MG/ML  SOLN
100.0000 mL | Freq: Once | INTRAMUSCULAR | Status: AC | PRN
  Administered 2020-03-05: 100 mL via INTRAVENOUS

## 2020-03-05 MED ORDER — ONDANSETRON HCL 4 MG/2ML IJ SOLN
4.0000 mg | Freq: Once | INTRAMUSCULAR | Status: AC
  Administered 2020-03-05: 4 mg via INTRAVENOUS
  Filled 2020-03-05: qty 2

## 2020-03-05 NOTE — ED Provider Notes (Signed)
[facility] EMERGENCY DEPARTMENT Provider Note   CSN: [id number] Arrival date & time: 03/05/20  7893     History Chief Complaint  Patient presents with   Abdominal Pain    Kevin [name] is a 74 y.o. male with PMH/o DM, CAD, diverticulosis, lap band surgery who presents for evaluation of abdominal pain.  He states that 2 days ago, he ate some soup and felt like he got food poisoning.  Shortly after eating it, he started having abdominal pain as well as nausea/vomiting.  He states he felt better after he vomited.  He states that he tried to eat something for the first time yesterday and after he ate that, he started having worsening upper abdominal pain.  He states he has had dry heaves but has not been able to vomit anything. He has been able to tolerate small amounts of liquids today but otherwise not eaten. He states he is concerned that he may have broke his LAP-BAND.  His last bowel movement was earlier this morning and states that it was normal.  He states he has had decreased urine output but is not any dysuria or hematuria.  He has not taken anything for the pain.  He denies any fevers, chest pain, difficulty breathing.  His surgeon is at [facility].   The history is provided by the patient.       Past Medical History:  Diagnosis Date   Angio-edema    Asthma    as a child. no problems now.    CAD (coronary artery disease)    Cellulitis 5/11   left leg   Diabetes mellitus    Diverticulosis    Diverticulosis    HTN (hypertension)    Hyperlipidemia    Melanoma ([place]) 10/11   left foot   Melanoma of foot ([place]) 06/02/2011   Microscopic colitis 10/09/10   Colonoscopy dr [name]   Obesity    OSA on CPAP 01/20/2014   Psoriasis    S/P colonoscopy 07/03/03    Dr [name]->hyperplastic rectal polyp   S/P endoscopy 10/09/10   lap band intact, duodenal erosions   Seasonal allergies    Sleep apnea    Urticaria     Patient Active Problem List   Diagnosis Date Noted    Anaphylactic shock due to adverse food reaction 08/22/2019   Dyslipidemia 02/22/2015   Acute coronary syndrome ([place]) 02/14/2015   NSTEMI (non-ST elevated myocardial infarction) ([place])    Chest pain 02/13/2015   OSA on CPAP 01/20/2014   Accelerating angina ([place]) 07/14/2013   Edema, lower extremity, bil 07/14/2013   Coronary artery disease, hx LAD and LCX stenting in 2001 & 2004, last cath 2009 with patent stents. 04/07/2013   Essential hypertension 04/07/2013   Hyperlipidemia 04/07/2013   Diabetes ([place]) 04/07/2013   Morbid obesity ([place]) 04/07/2013   Melanoma of foot ([place]) 06/02/2011   Microscopic colitis 10/24/2010   GI bleed 10/01/2010   Diarrhea 10/01/2010    Past Surgical History:  Procedure Laterality Date   ABDOMINAL SURGERY     ADENOIDECTOMY     APPENDECTOMY     CARDIAC CATHETERIZATION N/A 02/14/2015   Procedure: Left Heart Cath and Coronary Angiography;  Surgeon: Jettie Booze, MD;  Location: [place] [facility];  Service: Cardiovascular;  Laterality: N/A;   CARDIAC CATHETERIZATION N/A 02/14/2015   Procedure: Coronary Stent Intervention;  Surgeon: Jettie Booze, MD;  Location: [place] [facility];  Service: Cardiovascular;  Laterality: N/A;   CORONARY ANGIOPLASTY  2002/2005/2008   CORONARY ANGIOPLASTY WITH STENT PLACEMENT  06/11/2007   PTCA & stenting distal CX, PTCA & balloon angioplasty in-stent restenotic mid CX coronary artery stent   KNEE SURGERY     left   LEFT HEART CATHETERIZATION WITH CORONARY ANGIOGRAM N/A 07/15/2013   Procedure: LEFT HEART CATHETERIZATION WITH CORONARY ANGIOGRAM;  Surgeon: Sinclair Grooms, MD;  Location: [facility] CATH LAB;  Service: Cardiovascular;  Laterality: N/A;   NM MYOCAR PERF WALL MOTION  06/24/2011   small mild fixed anteroseptal & anteroapical defects.   PENILE PROSTHESIS IMPLANT     SHOULDER SURGERY     left   SINOSCOPY     TONSILLECTOMY     TYMPANOSTOMY TUBE PLACEMENT         Family History   Problem Relation Age of Onset   Coronary artery disease Mother    Diabetes Father    Liver cancer Brother 28   Celiac disease Other 72       brother's daughter   Allergic rhinitis Neg Hx    Angioedema Neg Hx    Asthma Neg Hx    Atopy Neg Hx    Eczema Neg Hx    Immunodeficiency Neg Hx    Urticaria Neg Hx     Social History   Tobacco Use   Smoking status: Former Smoker    Packs/day: 3.00    Years: 30.00    Pack years: 90.00    Types: Cigarettes    Start date: 11/18/1964    Quit date: 10/04/1994    Years since quitting: 25.4   Smokeless tobacco: Never Used   Tobacco comment: quit smoking 19 years ago  Vaping Use   Vaping Use: Never used  Substance Use Topics   Alcohol use: Yes    Alcohol/week: 0.0 standard drinks    Comment: 3-4 times/year   Drug use: No    Home Medications Prior to Admission medications   Medication Sig Start Date End Date Taking? Authorizing Provider  Ascorbic Acid (VITAMIN C) 1000 MG tablet Take 1,000 mg by mouth daily.    [provider]  aspirin 81 MG tablet Take 81 mg by mouth daily.    [provider]  cetirizine (ZYRTEC) 10 MG tablet Take 10 mg by mouth daily.    [provider]  clopidogrel (PLAVIX) 75 MG tablet TAKE ONE TABLET BY MOUTH ONCE DAILY. 02/06/20   Lorretta Harp, MD  Coenzyme Q10 (CO Q 10) 10 MG CAPS Take 1 capsule by mouth daily.    [provider]  diphenhydrAMINE (BENADRYL) 25 MG tablet Take 25 mg by mouth every 6 (six) hours as needed.    [provider]  EPINEPHrine (EPIPEN 2-PAK) 0.3 mg/0.3 mL IJ SOAJ injection Inject 0.3 mLs (0.3 mg total) into the muscle as needed for anaphylaxis. 01/12/19   Valentina Shaggy, MD  fluticasone [facility]) 50 MCG/ACT nasal spray Place 2 sprays into both nostrils daily.    [provider]  furosemide (LASIX) 40 MG tablet Take 40 mg by mouth daily.    [provider]  HYDROcodone-acetaminophen (NORCO/VICODIN) 5-325  MG tablet Take 1-2 tablets by mouth every 6 (six) hours as needed. 03/05/20   Volanda Napoleon, PA-C  metoprolol (TOPROL-XL) 100 MG 24 hr tablet Take 100 mg by mouth daily.      [provider]  niacin (NIASPAN) 1000 MG CR tablet TAKE 1 TABLET BY MOUTH AT BEDTIME. 07/11/19   Lorretta Harp, MD  nitrofurantoin, macrocrystal-monohydrate, (MACROBID)  100 MG capsule Take 1 capsule (100 mg total) by mouth 2 (two) times daily. 01/16/20   Idol, Almyra Free, PA-C  ondansetron (ZOFRAN ODT) 4 MG disintegrating tablet Take 1 tablet (4 mg total) by mouth every 8 (eight) hours as needed for nausea or vomiting. 03/05/20   Volanda Napoleon, PA-C  pravastatin (PRAVACHOL) 40 MG tablet Take 1 tablet (40 mg total) by mouth every evening. 04/18/19   Lorretta Harp, MD  RESVERATROL PO Take 1 capsule by mouth every morning.     [provider]  sitaGLIPtin (JANUVIA) 100 MG tablet Take 100 mg by mouth daily.    [provider]  valsartan-hydrochlorothiazide (DIOVAN-HCT) 160-12.5 MG per tablet Take 1 tablet by mouth daily.      [provider]    Allergies    Altace [ramipril], Avapro [irbesartan], Prednisone, Shellfish allergy, Tradjenta [linagliptin], Cephalexin, Clindamycin hcl, Codeine, Contrast media [iodinated diagnostic agents], Fenofibrate, Glycotrol [carozyme], Indomethacin, Minocycline hcl, Morphine and related, Sulfa drugs cross reactors, Tetracyclines & related, and Tree extract  Review of Systems   Review of Systems  Constitutional: Negative for fever.  Respiratory: Negative for cough and shortness of breath.   Cardiovascular: Negative for chest pain.  Gastrointestinal: Positive for abdominal pain and vomiting. Negative for nausea.  Genitourinary: Positive for decreased urine volume. Negative for dysuria and hematuria.  Neurological: Negative for headaches.  All other systems reviewed and are negative.   Physical Exam Updated Vital Signs BP 129/73    Pulse 77    Temp  (!) 97.5 F (36.4 C) (Oral)    Resp 20    Ht 5\' 9"  (1.753 m)    Wt 114.8 kg    SpO2 99%    BMI 37.36 kg/m   Physical Exam Vitals and nursing note reviewed.  Constitutional:      Appearance: Normal appearance. He is well-developed.     Comments: Appears uncomfortable  HENT:     Head: Normocephalic and atraumatic.  Eyes:     General: Lids are normal.     Conjunctiva/sclera: Conjunctivae normal.     Pupils: Pupils are equal, round, and reactive to light.  Cardiovascular:     Rate and Rhythm: Normal rate and regular rhythm.     Pulses: Normal pulses.     Heart sounds: Normal heart sounds. No murmur heard.  No friction rub. No gallop.   Pulmonary:     Effort: Pulmonary effort is normal.     Breath sounds: Normal breath sounds.     Comments: Lungs clear to auscultation bilaterally.  Symmetric chest rise.  No wheezing, rales, rhonchi. Abdominal:     Palpations: Abdomen is soft. Abdomen is not rigid.     Tenderness: There is generalized abdominal tenderness. There is no guarding.     Comments: Abdomen is soft, non-distended. Tenderness to palpation noted to the upper abdomen. No rigidity, No guarding. No peritoneal signs.  Musculoskeletal:        General: Normal range of motion.     Cervical back: Full passive range of motion without pain.  Skin:    General: Skin is warm and dry.     Capillary Refill: Capillary refill takes less than 2 seconds.  Neurological:     Mental Status: He is alert and oriented to person, place, and time.  Psychiatric:        Speech: Speech normal.     ED Results / Procedures / Treatments   Labs (all labs ordered are listed, but only abnormal results  are displayed) Labs Reviewed  COMPREHENSIVE METABOLIC PANEL - Abnormal; Notable for the following components:      Result Value   Glucose, Bld 158 (*)    BUN 39 (*)    Creatinine, Ser 1.42 (*)    Alkaline Phosphatase 34 (*)    GFR, Estimated 52 (*)    All other components within normal limits  LIPASE,  BLOOD  CBC    EKG None  Radiology CT ABDOMEN PELVIS W CONTRAST  Result Date: 03/05/2020 CLINICAL DATA:  Food poisoning, vomiting, history of prior bariatric surgery EXAM: CT ABDOMEN AND PELVIS WITH CONTRAST TECHNIQUE: Multidetector CT imaging of the abdomen and pelvis was performed using the standard protocol following bolus administration of intravenous contrast. CONTRAST:  126mL OMNIPAQUE IOHEXOL 300 MG/ML  SOLN COMPARISON:  09/04/2017 FINDINGS: Lower chest: No acute abnormality. Hepatobiliary: No focal liver abnormality is seen. No gallstones, gallbladder wall thickening, or biliary dilatation. Pancreas: Unremarkable. No pancreatic ductal dilatation or surrounding inflammatory changes. Spleen: 1.3 cm peripherally calcified hypodensity within the spleen compatible with cyst. Otherwise the spleen is unremarkable. Adrenals/Urinary Tract: Adrenal glands are unremarkable. Kidneys are normal, without renal calculi, focal lesion, or hydronephrosis. Bladder is unremarkable. Stomach/Bowel: Scattered gas fluid levels are seen within the mid jejunum in the central abdomen, with mild jejunal dilatation measuring up to 4.1 cm. There is no evidence of high-grade obstruction, as gas and stool are seen throughout the colon to the level of the rectum. Findings could reflect developing obstruction or ileus. Lap band is identified in the expected position. There is no evidence of extraluminal gas or fluid to suggest lap band rupture. Catheter tubing appears intact, with port in the subcutaneous tissues lower central abdomen. Vascular/Lymphatic: Aortic atherosclerosis. No enlarged abdominal or pelvic lymph nodes. Reproductive: Mild heterogeneous enlargement of the prostate. Penile prosthesis is identified. Other: No free fluid or free intraperitoneal gas. No abdominal wall hernia. Musculoskeletal: No acute or destructive bony lesions. Reconstructed images demonstrate multilevel thoracolumbar spondylosis. IMPRESSION: 1.  Scattered gas fluid levels within the mid jejunum, with mild jejunal dilatation measuring up to 4.1 cm. There is no evidence of high-grade obstruction, as gas and stool are seen throughout the colon to the level of the rectum. Findings could reflect developing obstruction or ileus. 2. Lap band in the expected position, without evidence of complication. 3. Aortic Atherosclerosis (ICD10-I70.0). Electronically Signed   By: Randa Ngo M.D.   On: 03/05/2020 15:57    Procedures Procedures (including critical care time)  Medications Ordered in ED Medications  diphenhydrAMINE (BENADRYL) injection 25 mg (25 mg Intravenous Given 03/05/20 1406)  ondansetron (ZOFRAN) injection 4 mg (4 mg Intravenous Given 03/05/20 1416)  iohexol (OMNIPAQUE) 300 MG/ML solution 100 mL (100 mLs Intravenous Contrast Given 03/05/20 1535)    ED Course  I have reviewed the triage vital signs and the nursing notes.  Pertinent labs & imaging results that were available during my care of the patient were reviewed by me and considered in my medical decision making (see chart for details).    MDM Rules/Calculators/A&P                          74 year old male past medical story of CAD, hypertension, diverticulosis, LAP-BAND surgery presents for evaluation of abdominal pain, nausea/vomiting.  Symptoms initially began about 2 days ago.  Felt better initially after vomiting but states that yesterday, when he tried to eat, he started having pain.  He is now concerned that he may  have broken his LAP-BAND.  On initial arrival, he is afebrile, appears uncomfortable but no acute distress.  Vital signs are stable.  On exam, he is tenderness palpation noted to the abdomen most notably in the upper abdomen.  No rigidity, guarding.  Plan for labs, imaging.  Concern for infectious process versus dysfunction of lap band.  Lipase normal.  CBC shows no leukocytosis or anemia.  CMP shows BUN of 39, creatinine 1.42.  I discussed results with  patient.  He has an allergy to contrast.  He states that his allergy is vomiting but states he is can tolerate the contrast if he has Benadryl before.   CT abd/pelvis shows scattered gas/fluid levels with the mid jejunum with mild jejunal dilatation measuring up to 4.1 cm.  No evidence of high-grade obstruction.  Findings could reflect developing obstruction or ileus.  Lap band in expected position without evidence of complication.  Discussed patient with Dr. Arnoldo Morale (Gen Surg) who reviewed patient's CT with me. He states that at this time, there is no surgical intervention needed. If patient's pain is unable to be controlled and patient can't tolerate PO he could be admitted for observation to the medicine service with plans for gen surgery to consult. If patient wishes to do bowel rest at home and monitor his symptoms, he feels like that would be reasonable.   Discussed results and plan with patient. We extensively discussed the options, and risks vs benefits. I offered patient admission into hospital. After extensive discussion, patient opted to go home and observe bowel rest. Patient instructed to closely monitor his symptoms and return to the ER if he has any worsening symptoms.  Patient appears medically stable.  He exhibits full medical capacity. Patient had ample opportunity for questions and discussion. All patient's questions were answered with full understanding. Strict return precautions discussed. Patient expresses understanding and agreement to plan.   Portions of this note were generated with [employer]. Dictation errors may occur despite best attempts at proofreading.  Final Clinical Impression(s) / ED Diagnoses Final diagnoses:  Pain of upper abdomen    Rx / DC Orders ED Discharge Orders         Ordered    ondansetron (ZOFRAN ODT) 4 MG disintegrating tablet  Every 8 hours PRN        03/05/20 1700    HYDROcodone-acetaminophen (NORCO/VICODIN) 5-325 MG tablet  Every 6  hours PRN        03/05/20 1700           Volanda Napoleon, PA-C 03/05/20 2357    Margette Fast, MD 03/09/20 1004

## 2020-03-05 NOTE — ED Triage Notes (Signed)
PT reports had food poisoning Saturday.  Reports has history of lab band and vomited Saturday.  Reports felt better after vomiting.  Reports started heaving again on Sunday but was unable to vomit.  Pt says thinks may have torn his lab band.

## 2020-03-05 NOTE — Discharge Instructions (Signed)
As we discussed, your blood work today was reassuring.  Your CT scan showed that some of your small intestines were slightly wider than normal.  Sometimes this can indicate the beginning of an ileus or beginning of obstruction.  At this time, we do not see an obvious obstruction.  As we discussed, you opted to try and manage her symptoms at home.  We will do this by engaging in bowel rest.  This means clear liquid diet only, which I have provided you information on.   Take Zofran for nausea/vomiting.  Take pain medication as directed.  If at any time, your pain gets worse, you have fevers, persistent vomiting that is uncontrolled or any other worsening in symptoms, return emergency department.

## 2020-03-13 DIAGNOSIS — I2510 Atherosclerotic heart disease of native coronary artery without angina pectoris: Secondary | ICD-10-CM | POA: Diagnosis not present

## 2020-03-13 DIAGNOSIS — R972 Elevated prostate specific antigen [PSA]: Secondary | ICD-10-CM | POA: Diagnosis not present

## 2020-03-13 DIAGNOSIS — E7849 Other hyperlipidemia: Secondary | ICD-10-CM | POA: Diagnosis not present

## 2020-03-13 DIAGNOSIS — I249 Acute ischemic heart disease, unspecified: Secondary | ICD-10-CM | POA: Diagnosis not present

## 2020-03-13 DIAGNOSIS — Z6839 Body mass index (BMI) 39.0-39.9, adult: Secondary | ICD-10-CM | POA: Diagnosis not present

## 2020-03-13 DIAGNOSIS — E119 Type 2 diabetes mellitus without complications: Secondary | ICD-10-CM | POA: Diagnosis not present

## 2020-03-19 DIAGNOSIS — E119 Type 2 diabetes mellitus without complications: Secondary | ICD-10-CM | POA: Diagnosis not present

## 2020-04-06 ENCOUNTER — Ambulatory Visit: Payer: Medicare PPO | Admitting: Cardiovascular Disease

## 2020-04-17 DIAGNOSIS — Z85820 Personal history of malignant melanoma of skin: Secondary | ICD-10-CM | POA: Diagnosis not present

## 2020-04-17 DIAGNOSIS — D225 Melanocytic nevi of trunk: Secondary | ICD-10-CM | POA: Diagnosis not present

## 2020-04-17 DIAGNOSIS — Z08 Encounter for follow-up examination after completed treatment for malignant neoplasm: Secondary | ICD-10-CM | POA: Diagnosis not present

## 2020-04-17 DIAGNOSIS — Z1283 Encounter for screening for malignant neoplasm of skin: Secondary | ICD-10-CM | POA: Diagnosis not present

## 2020-05-01 DIAGNOSIS — M7551 Bursitis of right shoulder: Secondary | ICD-10-CM | POA: Diagnosis not present

## 2020-05-01 DIAGNOSIS — Z6839 Body mass index (BMI) 39.0-39.9, adult: Secondary | ICD-10-CM | POA: Diagnosis not present

## 2020-05-04 DIAGNOSIS — Z7901 Long term (current) use of anticoagulants: Secondary | ICD-10-CM | POA: Diagnosis not present

## 2020-05-04 DIAGNOSIS — S0083XA Contusion of other part of head, initial encounter: Secondary | ICD-10-CM | POA: Diagnosis not present

## 2020-05-04 DIAGNOSIS — S0990XA Unspecified injury of head, initial encounter: Secondary | ICD-10-CM | POA: Diagnosis not present

## 2020-05-04 DIAGNOSIS — S81812A Laceration without foreign body, left lower leg, initial encounter: Secondary | ICD-10-CM | POA: Diagnosis not present

## 2020-05-04 DIAGNOSIS — M4802 Spinal stenosis, cervical region: Secondary | ICD-10-CM | POA: Diagnosis not present

## 2020-05-04 DIAGNOSIS — I10 Essential (primary) hypertension: Secondary | ICD-10-CM | POA: Diagnosis not present

## 2020-05-04 DIAGNOSIS — S0081XA Abrasion of other part of head, initial encounter: Secondary | ICD-10-CM | POA: Diagnosis not present

## 2020-05-04 DIAGNOSIS — Z23 Encounter for immunization: Secondary | ICD-10-CM | POA: Diagnosis not present

## 2020-05-04 DIAGNOSIS — W19XXXA Unspecified fall, initial encounter: Secondary | ICD-10-CM | POA: Diagnosis not present

## 2020-05-05 DIAGNOSIS — R8281 Pyuria: Secondary | ICD-10-CM

## 2020-05-05 HISTORY — DX: Pyuria: R82.81

## 2020-05-09 ENCOUNTER — Other Ambulatory Visit: Payer: Self-pay | Admitting: Cardiovascular Disease

## 2020-05-14 DIAGNOSIS — N401 Enlarged prostate with lower urinary tract symptoms: Secondary | ICD-10-CM | POA: Diagnosis not present

## 2020-05-14 DIAGNOSIS — R8279 Other abnormal findings on microbiological examination of urine: Secondary | ICD-10-CM | POA: Diagnosis not present

## 2020-05-14 DIAGNOSIS — R3912 Poor urinary stream: Secondary | ICD-10-CM | POA: Diagnosis not present

## 2020-05-14 DIAGNOSIS — R972 Elevated prostate specific antigen [PSA]: Secondary | ICD-10-CM | POA: Diagnosis not present

## 2020-05-15 DIAGNOSIS — Z6839 Body mass index (BMI) 39.0-39.9, adult: Secondary | ICD-10-CM | POA: Diagnosis not present

## 2020-05-15 DIAGNOSIS — Z0000 Encounter for general adult medical examination without abnormal findings: Secondary | ICD-10-CM | POA: Diagnosis not present

## 2020-05-15 DIAGNOSIS — Z1389 Encounter for screening for other disorder: Secondary | ICD-10-CM | POA: Diagnosis not present

## 2020-05-15 DIAGNOSIS — M75101 Unspecified rotator cuff tear or rupture of right shoulder, not specified as traumatic: Secondary | ICD-10-CM | POA: Diagnosis not present

## 2020-05-15 DIAGNOSIS — E039 Hypothyroidism, unspecified: Secondary | ICD-10-CM | POA: Diagnosis not present

## 2020-05-15 DIAGNOSIS — Z1331 Encounter for screening for depression: Secondary | ICD-10-CM | POA: Diagnosis not present

## 2020-05-22 ENCOUNTER — Other Ambulatory Visit: Payer: Self-pay | Admitting: Cardiovascular Disease

## 2020-05-25 ENCOUNTER — Other Ambulatory Visit (HOSPITAL_COMMUNITY): Payer: Self-pay | Admitting: Family Medicine

## 2020-05-25 ENCOUNTER — Other Ambulatory Visit: Payer: Self-pay | Admitting: Family Medicine

## 2020-05-25 DIAGNOSIS — M75101 Unspecified rotator cuff tear or rupture of right shoulder, not specified as traumatic: Secondary | ICD-10-CM

## 2020-06-06 ENCOUNTER — Ambulatory Visit: Payer: Medicare PPO | Admitting: Cardiovascular Disease

## 2020-06-06 ENCOUNTER — Telehealth: Payer: Self-pay | Admitting: Cardiovascular Disease

## 2020-06-06 ENCOUNTER — Encounter: Payer: Self-pay | Admitting: Cardiovascular Disease

## 2020-06-06 ENCOUNTER — Other Ambulatory Visit: Payer: Self-pay

## 2020-06-06 DIAGNOSIS — Z9989 Dependence on other enabling machines and devices: Secondary | ICD-10-CM

## 2020-06-06 DIAGNOSIS — G4733 Obstructive sleep apnea (adult) (pediatric): Secondary | ICD-10-CM | POA: Diagnosis not present

## 2020-06-06 DIAGNOSIS — E782 Mixed hyperlipidemia: Secondary | ICD-10-CM | POA: Diagnosis not present

## 2020-06-06 DIAGNOSIS — I2510 Atherosclerotic heart disease of native coronary artery without angina pectoris: Secondary | ICD-10-CM | POA: Diagnosis not present

## 2020-06-06 DIAGNOSIS — I10 Essential (primary) hypertension: Secondary | ICD-10-CM

## 2020-06-06 NOTE — Telephone Encounter (Signed)
Advised patient, verbalized understanding  

## 2020-06-06 NOTE — Progress Notes (Signed)
06/06/2020 FODAY BRADEN   02-May-1946  PB:[id number]  Primary Physician Sharilyn Sites, MD Primary Cardiologist: Lorretta Harp MD FACP, [place], [place], [place]  HPI:  Kevin [name] is a 75 y.o.  severely overweight divorced Caucasian male, father of 1 child, who I last saw in the office10/23/2020. 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 He has a history of CAD, status post remote LAD and circumflex stenting in 2001 and 2004 respectively by Drs. [name] and [name]. He stopped smoking and drinking at that time as well. His other problems include hypertension, hyperlipidemia, and non-insulin-requiring diabetes. He also has obstructive sleep apnea, on CPAP. He has had lap-banding in the past with ultimate reaccumulation of his weight. He has had endovenous ablation by Dr. Elisabeth Cara of his left greater saphenous vein, which really did not afford significant clinical improvement. He had a functional study performed December 22, 2008, which was nonischemic, and another one June 24, 2011, because of chest pain, which was low risk. He has been asymptomatic.Dr. [name] recently checked his lipid profile last week as an outpatient. He was admitted to [facility] last month which has been shortness of breath. He underwent cardiac catheterization by Dr. Jeralene Peters Smith's revealing patent LAD and circumflex stents and a left dominant system with an occluded nondominant RCA. Medical therapy was recommended. He experimented with stopping his newly added diabetes medicines one of which apparently was contributing to his symptoms. He feels clinically improved after stopping this medication. Since I saw him in March 2016 he was admitted with unstable angina on 02/14/15 and underwent cardiac catheterization by Dr. [name] His LAD stent had diffuse in-stent restenosis with a 95% distal edge stenosis which was restented using a 3.5 x 28 mm long synergy drug-eluting stent. He  did have an ostial diagonal branch stenosis which was "jailedand a 75% first obtuse marginal branch stenosis as well as an occluded nondominant RCA.  Since I saw him over a year ago he has had 1 episode of jaw pain but no chest pain.  He does complain of pain in both of his shoulders with radiation to his elbow and hands.  He scheduled for an MRI this Friday.  His symptoms sound like a cervical radiculopathy.  He also is interested in weight loss we will make the appropriate referral.  He is seeking alternative therapies for his obstructive sleep apnea since he says he is intolerant to CPAP.    Current Meds  Medication Sig  . Ascorbic Acid (VITAMIN C) 1000 MG tablet Take 1,000 mg by mouth daily.  [name] aspirin 81 MG tablet Take 81 mg by mouth daily.  . cetirizine (ZYRTEC) 10 MG tablet Take 10 mg by mouth daily.  . clopidogrel (PLAVIX) 75 MG tablet TAKE ONE TABLET BY MOUTH ONCE DAILY.  [name] Coenzyme Q10 (CO Q 10) 10 MG CAPS Take 1 capsule by mouth daily.  . diphenhydrAMINE (BENADRYL) 25 MG tablet Take 25 mg by mouth every 6 (six) hours as needed.  [name] EPINEPHrine (EPIPEN 2-PAK) 0.3 mg/0.3 mL IJ SOAJ injection Inject 0.3 mLs (0.3 mg total) into the muscle as needed for anaphylaxis.  . fluticasone (FLONASE) 50 MCG/ACT nasal spray Place 2 sprays into both nostrils daily.  . furosemide (LASIX) 40 MG tablet Take 40 mg by mouth daily.  [name] HYDROcodone-acetaminophen (NORCO/VICODIN) 5-325 MG tablet Take 1-2 tablets by mouth every 6 (six) hours as needed.  . Levothyroxine Sodium  50 MCG CAPS Take 50 mcg by mouth daily before breakfast.  . metoprolol (TOPROL-XL) 100 MG 24 hr tablet Take 100 mg by mouth daily.  . niacin (NIASPAN) 1000 MG CR tablet Take 1 tablet (1,000 mg total) by mouth at bedtime. Pt needs to keep upcoming appt in Feb for further refills  . nitrofurantoin, macrocrystal-monohydrate, (MACROBID) 100 MG capsule Take 1 capsule (100 mg total) by mouth 2 (two) times daily.  . ondansetron (ZOFRAN ODT) 4 MG  disintegrating tablet Take 1 tablet (4 mg total) by mouth every 8 (eight) hours as needed for nausea or vomiting.  . pravastatin (PRAVACHOL) 40 MG tablet Take 1 tablet (40 mg total) by mouth every evening.  [name] RESVERATROL PO Take 1 capsule by mouth every morning.   . sitaGLIPtin (JANUVIA) 100 MG tablet Take 100 mg by mouth daily.  . valsartan-hydrochlorothiazide (DIOVAN-HCT) 160-12.5 MG per tablet Take 1 tablet by mouth daily.     Allergies  Allergen Reactions  . Altace [Ramipril] Shortness Of Breath  . Avapro [Irbesartan] Shortness Of Breath  . Prednisone Anaphylaxis, Swelling and Other (See Comments)    SWELLING OF THE LIPS  . Shellfish Allergy Anaphylaxis    Shrimp, selfish  . Tradjenta [Linagliptin] Shortness Of Breath  . Cephalexin Hives and Swelling  . Clindamycin Hcl Hives and Swelling  . Codeine Nausea And Vomiting and Other (See Comments)    AFFECTS HEART  . Contrast Media [Iodinated Diagnostic Agents]     Can be administered if Benadryl is administered prior to prevent reaction   . Fenofibrate Other (See Comments)    "My skin started peeling off"  . Glycotrol [Carozyme] Other (See Comments)    unknown  . Indomethacin Other (See Comments)    UNKNOWN REACTION  . Minocycline Hcl Hives and Swelling  . Morphine And Related Nausea And Vomiting    PROJECTILE VOMITING  . Sulfa Drugs Cross Reactors Hives and Swelling  . Tetracyclines & Related   . Tree Extract Itching    Social History   Socioeconomic History  . Marital status: Divorced    Spouse name: Not on file  . Number of children: 1  . Years of education: Not on file  . Highest education level: Not on file  Occupational History  . Occupation: Leisure centre manager: [employer]    Comment: [place]  Tobacco Use  . Smoking status: Former Smoker    Packs/day: 3.00    Years: 30.00    Pack years: 90.00    Types: Cigarettes    Start date: 11/18/1964    Quit date: 10/04/1994    Years since quitting: 25.6   . Smokeless tobacco: Never Used  . Tobacco comment: quit smoking 19 years ago  Vaping Use  . Vaping Use: Never used  Substance and Sexual Activity  . Alcohol use: Yes    Alcohol/week: 0.0 standard drinks    Comment: 3-4 times/year  . Drug use: No  . Sexual activity: Not Currently  Other Topics Concern  . Not on file  Social History Narrative  . Not on file   Social Determinants of Health   Financial Resource Strain: Not on file  Food Insecurity: Not on file  Transportation Needs: Not on file  Physical Activity: Not on file  Stress: Not on file  Social Connections: Not on file  Intimate Partner Violence: Not on file     Review of Systems: General: negative for chills, fever, night sweats or weight changes.  Cardiovascular: negative for chest pain, dyspnea on exertion, edema, orthopnea, palpitations, paroxysmal nocturnal dyspnea or shortness of breath Dermatological: negative for rash Respiratory: negative for cough or wheezing Urologic: negative for hematuria Abdominal: negative for nausea, vomiting, diarrhea, bright red blood per rectum, melena, or hematemesis Neurologic: negative for visual changes, syncope, or dizziness All other systems reviewed and are otherwise negative except as noted above.    Blood pressure 138/70, pulse 73, height 5' 9.5" (1.765 m), weight 256 lb (116.1 kg).  General appearance: alert and no distress Neck: no adenopathy, no carotid bruit, no JVD, supple, symmetrical, trachea midline and thyroid not enlarged, symmetric, no tenderness/mass/nodules Lungs: clear to auscultation bilaterally Heart: regular rate and rhythm, S1, S2 normal, no murmur, click, rub or gallop Extremities: extremities normal, atraumatic, no cyanosis or edema Pulses: 2+ and symmetric Skin: Skin color, texture, turgor normal. No rashes or lesions Neurologic: Alert and oriented X 3, normal strength and tone. Normal symmetric reflexes. Normal coordination and gait  EKG sinus  rhythm at 73 without ST or T wave changes.  I personally reviewed this EKG.  ASSESSMENT AND PLAN:   OSA on CPAP History of obstructive sleep apnea intolerant to CPAP.  He is seeking further evaluation by Dr. Wilburn Cornelia, ENT for alternative therapies.  Coronary artery disease, hx LAD and LCX stenting in 2001 & 2004, last cath 2009 with patent stents. History of CAD status post LAD and circumflex stenting in 2001 and 2004 respectively by Drs. [name] and [name].  He had cardiac catheterization performed by Dr. [name] in the past revealing patent LAD and circumflex stents with an occluded nondominant dominant RCA.  He was seen in March 2016 in the hospital for unstable angina underwent cardiac catheterization by Dr. [name] revealing diffuse in-stent restenosis in his LAD stent with a 95% distal edge stenosis which was restented with a 3.5 mm x 20 mm long Synergy drug-eluting stent.  He did have jailing of a diagonal branch ostium and had a 75% first obtuse marginal branch stenosis which was treated medically.  He currently denies chest pain.  Essential hypertension History of essential hypertension blood pressure measured today 138/70.  He is on metoprolol, valsartan hydrochlorothiazide.  Hyperlipidemia History of hyperlipidemia on pravastatin with lipid profile performed 03/13/2020 revealing total cholesterol 121, LDL of 82 and HDL of 33.  Morbid obesity ([place]) History of morbid obesity with a BMI of 37.  He is interested in weight reduction.  I am referring him to [facility]      Lorretta Harp MD [facility], [facility] 06/06/2020 9:24 AM

## 2020-06-06 NOTE — Assessment & Plan Note (Signed)
History of essential hypertension blood pressure measured today 138/70.  He is on metoprolol, valsartan hydrochlorothiazide.

## 2020-06-06 NOTE — Patient Instructions (Addendum)
Medication Instructions:  Your physician recommends that you continue on your current medications as directed. Please refer to the Current Medication list given to you today.  *If you need a refill on your cardiac medications before your next appointment, please call your pharmacy*   Follow-Up: At [facility], you and your health needs are our priority.  As part of our continuing mission to provide you with exceptional heart care, we have created designated Provider Care Teams.  These Care Teams include your primary Cardiologist (physician) and Advanced Practice Providers (APPs -  Physician Assistants and Nurse Practitioners) who all work together to provide you with the care you need, when you need it.  We recommend signing up for the patient portal called "MyChart".  Sign up information is provided on this After Visit Summary.  MyChart is used to connect with patients for Virtual Visits (Telemedicine).  Patients are able to view lab/test results, encounter notes, upcoming appointments, etc.  Non-urgent messages can be sent to your provider as well.   To learn more about what you can do with MyChart, go to [email or web address].    Your next appointment:   12 month(s)  The format for your next appointment:   In Person  Provider:   Quay Burow, MD   Other Instructions Referral made to Diet and Weight loss management center with Dr. Dennard Nip.   [place], [place], [place] [address] [phone]

## 2020-06-06 NOTE — Assessment & Plan Note (Signed)
History of obstructive sleep apnea intolerant to CPAP.  He is seeking further evaluation by Dr. Wilburn Cornelia, ENT for alternative therapies.

## 2020-06-06 NOTE — Telephone Encounter (Signed)
Pt c/o medication issue:  1. Name of Medication: levothyroxine 50 mcg  2. How are you currently taking this medication (dosage and times per day)? Once daily  3. Are you having a reaction (difficulty breathing--STAT)? No   4. What is your medication issue? Patient states he PCP prescribed this medication and he has been taking it for about 8 weeks. He forgot to mention it to Dr. [name] during his appointment today, but he would like to ensure that it is alright to continue taking. Please advise.

## 2020-06-06 NOTE — Telephone Encounter (Signed)
There are no drug interactions with his levothyroxine aside from a minor interaction with his furosemide (furosemide can increase free T4). Shouldn't be an issue as long as his PCP is monitoring his thyroid levels. I have updated his med list with correct levothyroxine dosing.

## 2020-06-06 NOTE — Assessment & Plan Note (Signed)
History of hyperlipidemia on pravastatin with lipid profile performed 03/13/2020 revealing total cholesterol 121, LDL of 82 and HDL of 33.

## 2020-06-06 NOTE — Telephone Encounter (Signed)
Kevin [name] is calling back stating he accidentally gave the wrong dosage for this medication. He states it is 100 mcg not 50 mcg. Please advise.

## 2020-06-06 NOTE — Assessment & Plan Note (Signed)
History of CAD status post LAD and circumflex stenting in 2001 and 2004 respectively by Drs. [name] and [name].  He had cardiac catheterization performed by Dr. [name] in the past revealing patent LAD and circumflex stents with an occluded nondominant dominant RCA.  He was seen in March 2016 in the hospital for unstable angina underwent cardiac catheterization by Dr. [name] revealing diffuse in-stent restenosis in his LAD stent with a 95% distal edge stenosis which was restented with a 3.5 mm x 20 mm long Synergy drug-eluting stent.  He did have jailing of a diagonal branch ostium and had a 75% first obtuse marginal branch stenosis which was treated medically.  He currently denies chest pain.

## 2020-06-06 NOTE — Assessment & Plan Note (Addendum)
History of morbid obesity with a BMI of 37.  He is interested in weight reduction.  I am referring him to [facility]

## 2020-06-08 ENCOUNTER — Other Ambulatory Visit: Payer: Self-pay

## 2020-06-08 ENCOUNTER — Ambulatory Visit (HOSPITAL_COMMUNITY)
Admission: RE | Admit: 2020-06-08 | Discharge: 2020-06-08 | Disposition: A | Discharge status: Home / Self Care | Payer: Medicare PPO | Source: Ambulatory Visit | Attending: Family Medicine | Admitting: Family Medicine

## 2020-06-08 DIAGNOSIS — M75101 Unspecified rotator cuff tear or rupture of right shoulder, not specified as traumatic: Secondary | ICD-10-CM

## 2020-06-08 DIAGNOSIS — M25511 Pain in right shoulder: Secondary | ICD-10-CM | POA: Diagnosis not present

## 2020-06-11 ENCOUNTER — Other Ambulatory Visit: Payer: Self-pay | Admitting: Cardiovascular Disease

## 2020-06-14 DIAGNOSIS — R3914 Feeling of incomplete bladder emptying: Secondary | ICD-10-CM | POA: Diagnosis not present

## 2020-06-14 DIAGNOSIS — N401 Enlarged prostate with lower urinary tract symptoms: Secondary | ICD-10-CM | POA: Diagnosis not present

## 2020-06-14 DIAGNOSIS — R972 Elevated prostate specific antigen [PSA]: Secondary | ICD-10-CM | POA: Diagnosis not present

## 2020-06-15 ENCOUNTER — Telehealth: Payer: Self-pay | Admitting: Cardiovascular Disease

## 2020-06-15 NOTE — Telephone Encounter (Signed)
    Pt c/o medication issue:  1. Name of Medication:   levothyroxine (SYNTHROID) 100 MCG tablet    2. How are you currently taking this medication (dosage and times per day)? Take 100 mcg by mouth daily before breakfast.  3. Are you having a reaction (difficulty breathing--STAT)?   4. What is your medication issue? Pt said his pcp prescribed this medication and started taking it 6-8 weeks now, he said 1 week before seeing Dr. [name] he felt his heart "racing like crazy" that last for an hour, he didn't think about that much but it happen again today. He said this medication probably causing him to palpitate and would like to ask Dr. [name] about it.

## 2020-06-15 NOTE — Telephone Encounter (Signed)
Spoke with patient. Patient has been on Synthroid for a while now. Had labs drawn 6 weeks after starting the medication on 05/15/20 and told his thyroid was stable. He did not have any symptoms before starting the synthroid but since then has experienced his heart racing at times. His heart rate was racing last night and he says it was about 150 bpm. Asked what HR is currently and he states "I'd say about 80-95 bpm." No shortness of breath, chest pain or dizziness. No vital signs equipment available at home.  Advised patient that thyroid level may need to be rechecked by PCP and dosage adjusted. Advised patient he should reach out to PCP. Patient states he thinks the dose of synthroid may be too high but wants Dr. [name] to discuss this with Dr. [name].   He also reports the last few times he has been to see a doctor he has been told his blood pressure has been elevated. Advised patient to purchase [employer] BP cuff and monitor blood pressure and heart rate. Appointment made for 2/22 with Coletta Memos, NP. Requested patient bring BP and HR log with him to appointment. Will route message to Dr. [name].

## 2020-06-19 DIAGNOSIS — Z6841 Body Mass Index (BMI) 40.0 and over, adult: Secondary | ICD-10-CM | POA: Diagnosis not present

## 2020-06-19 DIAGNOSIS — M75101 Unspecified rotator cuff tear or rupture of right shoulder, not specified as traumatic: Secondary | ICD-10-CM | POA: Diagnosis not present

## 2020-06-19 DIAGNOSIS — I249 Acute ischemic heart disease, unspecified: Secondary | ICD-10-CM | POA: Diagnosis not present

## 2020-06-19 DIAGNOSIS — E1129 Type 2 diabetes mellitus with other diabetic kidney complication: Secondary | ICD-10-CM | POA: Diagnosis not present

## 2020-06-19 DIAGNOSIS — E7849 Other hyperlipidemia: Secondary | ICD-10-CM | POA: Diagnosis not present

## 2020-06-19 DIAGNOSIS — I2510 Atherosclerotic heart disease of native coronary artery without angina pectoris: Secondary | ICD-10-CM | POA: Diagnosis not present

## 2020-06-20 DIAGNOSIS — M25511 Pain in right shoulder: Secondary | ICD-10-CM | POA: Diagnosis not present

## 2020-06-20 DIAGNOSIS — M25512 Pain in left shoulder: Secondary | ICD-10-CM | POA: Diagnosis not present

## 2020-06-25 NOTE — Progress Notes (Signed)
Cardiology Clinic Note   Patient Name: Kevin [name] Date of Encounter: 06/26/2020  Primary Care Provider:  Sharilyn Sites, MD Primary Cardiologist:  Quay Burow, MD  Patient Profile    Kevin [name]. [place] 75 year old male presents the clinic today for an evaluation of his accelerated/increased heart rate.  Past Medical History    Past Medical History:  Diagnosis Date  . Angio-edema   . Asthma    as a child. no problems now.   Kevin [name] CAD (coronary artery disease)   . Cellulitis 5/11   left leg  . Diabetes mellitus   . Diverticulosis   . Diverticulosis   . HTN (hypertension)   . Hyperlipidemia   . Melanoma ([name]) 10/11   left foot  . Melanoma of foot ([name]) 06/02/2011  . Microscopic colitis 10/09/10   Colonoscopy dr [name]  . Obesity   . OSA on CPAP 01/20/2014  . Psoriasis   . S/P colonoscopy 07/03/03    Dr [name]->hyperplastic rectal polyp  . S/P endoscopy 10/09/10   lap band intact, duodenal erosions  . Seasonal allergies   . Sleep apnea   . Urticaria    Past Surgical History:  Procedure Laterality Date  . ABDOMINAL SURGERY    . ADENOIDECTOMY    . APPENDECTOMY    . CARDIAC CATHETERIZATION N/A 02/14/2015   Procedure: Left Heart Cath and Coronary Angiography;  Surgeon: Jettie Booze, MD;  Location: [facility];  Service: Cardiovascular;  Laterality: N/A;  . CARDIAC CATHETERIZATION N/A 02/14/2015   Procedure: Coronary Stent Intervention;  Surgeon: Jettie Booze, MD;  Location: [place] [facility];  Service: Cardiovascular;  Laterality: N/A;  . CORONARY ANGIOPLASTY  2002/2005/2008  . CORONARY ANGIOPLASTY WITH STENT PLACEMENT  06/11/2007   PTCA & stenting distal CX, PTCA & balloon angioplasty in-stent restenotic mid CX coronary artery stent  . KNEE SURGERY     left  . LEFT HEART CATHETERIZATION WITH CORONARY ANGIOGRAM N/A 07/15/2013   Procedure: LEFT HEART CATHETERIZATION WITH CORONARY ANGIOGRAM;  Surgeon: Sinclair Grooms, MD;  Location: [facility] C[facility];  Service:  Cardiovascular;  Laterality: N/A;  . NM MYOCAR PERF WALL MOTION  06/24/2011   small mild fixed anteroseptal & anteroapical defects.  Kevin [name] PENILE PROSTHESIS IMPLANT    . SHOULDER SURGERY     left  . SINOSCOPY    . TONSILLECTOMY    . TYMPANOSTOMY TUBE PLACEMENT      Allergies  Allergies  Allergen Reactions  . Altace [Ramipril] Shortness Of Breath  . Avapro [Irbesartan] Shortness Of Breath  . Prednisone Anaphylaxis, Swelling and Other (See Comments)    SWELLING OF THE LIPS  . Shellfish Allergy Anaphylaxis    Shrimp, selfish  . Tradjenta [Linagliptin] Shortness Of Breath  . Cephalexin Hives and Swelling  . Clindamycin Hcl Hives and Swelling  . Codeine Nausea And Vomiting and Other (See Comments)    AFFECTS HEART  . Contrast Media [Iodinated Diagnostic Agents]     Can be administered if Benadryl is administered prior to prevent reaction   . Fenofibrate Other (See Comments)    "My skin started peeling off"  . Glycotrol [Carozyme] Other (See Comments)    unknown  . Indomethacin Other (See Comments)    UNKNOWN REACTION  . Minocycline Hcl Hives and Swelling  . Morphine And Related Nausea And Vomiting    PROJECTILE VOMITING  . Sulfa Drugs Cross Reactors Hives and Swelling  . Tetracyclines & Related   . Tree Extract Itching  History of Present Illness    Mr. Kevin [name] PMH includes coronary artery disease status post remote LAD and circumflex stenting 2001 and in 2004 by Dr. [name] and [name].  He discontinued his smoking and drinking at that time.  His PMH also includes essential hypertension, HLD, type 2 diabetes, and OSA on CPAP.  He underwent lap banding however, he ultimately regained his weight.  He underwent an ablation procedure by Dr. Elisabeth Cara of his left great saphenous vein.  He did not have significant improvement with the procedure.  He had a functional study 8/10 which was nonischemic.  He had follow-up February 2013 due to chest pain which showed low risk.  He  was admitted to [facility] with shortness of breath.  He underwent cardiac catheterization by Dr. [name] which showed a patent LAD and circumflex stents, left dominant system and occluded nondominant right.  Medical therapy was recommended.  He reported he experimented with stopping his newly added diabetes medication.  He reported he felt clinically improved after discontinuation.  He was admitted 10/16 with unstable angina and underwent cardiac catheterization by Dr. [name].  It showed in-stent restenosis of his LAD with a 95% distal edge stenosis.  It was treated with DES.  He was also noted to have a ostial diagonal branch stenosis which was jailed and a 75% first obtuse marginal branch stenosis as well as an occluded nondominant RCA.  He was seen in follow-up by Dr. [name] on 06/06/2020.  During that time he reported 1 episode of jaw pain but no chest pain.  He did complain of pain in both shoulders with radiation to his elbows and hands.  He reported he was scheduled for an MRI on Friday.  He was felt that his symptoms were consistent with cervical radiculopathy.  He was referred to the weight loss clinic.  He was seeking alternative therapies for his OSA and reported intolerance of his CPAP.  He contacted nurse triage line on 06/15/2020.  He indicated that he was noticing increased heart rates as high as 150.  He reported that he had noticed the increase after starting to take his Synthroid.  However, he reported he had been on Synthroid for quite some time.  He requested that Dr. [name] reach out to Dr. [name] to discuss lowering his Synthroid medication.  He presents to the clinic today for follow-up evaluation states he noticed a few weeks ago he had increased heart rates that he felt was related to his initiation of Synthroid medication.  We reviewed his chart and his EKGs.  In September 2021 he was also noted to have a accelerated rhythm.  He requests short shoulder surgery as well.  Due to  his paroxysmal nature of his heart rates we will do a 14-day ZIO monitor, order a CBC, and BMP.  Told him that we will need to defer his surgery until after his cardiac event monitor results are reviewed.  I will have him follow-up in 8 weeks and avoid triggers for accelerated heart rate.  EKG today shows normal sinus rhythm 84 bpm no ST or T wave deviation.  Today he denies chest pain, shortness of breath, lower extremity edema, fatigue, palpitations, melena, hematuria, hemoptysis, diaphoresis, weakness, presyncope, syncope, orthopnea, and PND.   Home Medications    Prior to Admission medications   Medication Sig Start Date End Date Taking? Authorizing Provider  Ascorbic Acid (VITAMIN C) 1000 MG tablet Take 1,000 mg by mouth daily.    [provider]  aspirin 81 MG tablet Take 81 mg by mouth daily.    [provider]  cetirizine (ZYRTEC) 10 MG tablet Take 10 mg by mouth daily.    [provider]  clopidogrel (PLAVIX) 75 MG tablet TAKE ONE TABLET BY MOUTH ONCE DAILY. 06/11/20   Lorretta Harp, MD  Coenzyme Q10 (CO Q 10) 10 MG CAPS Take 1 capsule by mouth daily.    [provider]  diphenhydrAMINE (BENADRYL) 25 MG tablet Take 25 mg by mouth every 6 (six) hours as needed.    [provider]  EPINEPHrine (EPIPEN 2-PAK) 0.3 mg/0.3 mL IJ SOAJ injection Inject 0.3 mLs (0.3 mg total) into the muscle as needed for anaphylaxis. 01/12/19   Valentina Shaggy, MD  fluticasone [facility]) 50 MCG/ACT nasal spray Place 2 sprays into both nostrils daily.    [provider]  furosemide (LASIX) 40 MG tablet Take 40 mg by mouth daily.    [provider]  HYDROcodone-acetaminophen (NORCO/VICODIN) 5-325 MG tablet Take 1-2 tablets by mouth every 6 (six) hours as needed. 03/05/20   Volanda Napoleon, PA-C  levothyroxine (SYNTHROID) 100 MCG tablet Take 100 mcg by mouth daily before breakfast.    [provider]  metoprolol (TOPROL-XL) 100 MG 24 hr  tablet Take 100 mg by mouth daily.    [provider]  niacin (NIASPAN) 1000 MG CR tablet Take 1 tablet (1,000 mg total) by mouth at bedtime. Pt needs to keep upcoming appt in Feb for further refills 05/22/20   Lorretta Harp, MD  nitrofurantoin, macrocrystal-monohydrate, (MACROBID) 100 MG capsule Take 1 capsule (100 mg total) by mouth 2 (two) times daily. 01/16/20   Idol, Almyra Free, PA-C  ondansetron (ZOFRAN ODT) 4 MG disintegrating tablet Take 1 tablet (4 mg total) by mouth every 8 (eight) hours as needed for nausea or vomiting. 03/05/20   Volanda Napoleon, PA-C  pravastatin (PRAVACHOL) 40 MG tablet Take 1 tablet (40 mg total) by mouth every evening. 04/18/19   Lorretta Harp, MD  RESVERATROL PO Take 1 capsule by mouth every morning.     [provider]  sitaGLIPtin (JANUVIA) 100 MG tablet Take 100 mg by mouth daily.    [provider]  valsartan-hydrochlorothiazide (DIOVAN-HCT) 160-12.5 MG per tablet Take 1 tablet by mouth daily.    [provider]    Family History    Family History  Problem Relation Age of Onset  . Coronary artery disease Mother   . Diabetes Father   . Liver cancer Brother 57  . Celiac disease Other 55       brother's daughter  . Allergic rhinitis Neg Hx   . Angioedema Neg Hx   . Asthma Neg Hx   . Atopy Neg Hx   . Eczema Neg Hx   . Immunodeficiency Neg Hx   . Urticaria Neg Hx    He indicated that his mother is deceased. He indicated that his father is deceased. He indicated that one of his two brothers is deceased. He indicated that his maternal grandmother is deceased. He indicated that his maternal grandfather is deceased. He indicated that his paternal grandmother is deceased. He indicated that his paternal grandfather is deceased. He indicated that the status of his neg hx is unknown. He indicated that the status of his other is unknown.  Social History    Social History   Socioeconomic History  . Marital status: Divorced     Spouse name: Not on file  .  Number of children: 1  . Years of education: Not on file  . Highest education level: Not on file  Occupational History  . Occupation: Leisure centre manager: [employer]    Comment: [employer]  Tobacco Use  . Smoking status: Former Smoker    Packs/day: 3.00    Years: 30.00    Pack years: 90.00    Types: Cigarettes    Start date: 11/18/1964    Quit date: 10/04/1994    Years since quitting: 25.7  . Smokeless tobacco: Never Used  . Tobacco comment: quit smoking 19 years ago  Vaping Use  . Vaping Use: Never used  Substance and Sexual Activity  . Alcohol use: Yes    Alcohol/week: 0.0 standard drinks    Comment: 3-4 times/year  . Drug use: No  . Sexual activity: Not Currently  Other Topics Concern  . Not on file  Social History Narrative  . Not on file   Social Determinants of Health   Financial Resource Strain: Not on file  Food Insecurity: Not on file  Transportation Needs: Not on file  Physical Activity: Not on file  Stress: Not on file  Social Connections: Not on file  Intimate Partner Violence: Not on file     Review of Systems    General:  No chills, fever, night sweats or weight changes.  Cardiovascular:  No chest pain, dyspnea on exertion, edema, orthopnea, palpitations, paroxysmal nocturnal dyspnea. Dermatological: No rash, lesions/masses Respiratory: No cough, dyspnea Urologic: No hematuria, dysuria Abdominal:   No nausea, vomiting, diarrhea, bright red blood per rectum, melena, or hematemesis Neurologic:  No visual changes, wkns, changes in mental status. All other systems reviewed and are otherwise negative except as noted above.  Physical Exam    VS:  BP 120/70   Ht 5\' 9"  (1.753 m)   Wt 249 lb (112.9 kg)   BMI 36.77 kg/m  , BMI Body mass index is 36.77 kg/m. GEN: Well nourished, well developed, in no acute distress. HEENT: normal. Neck: Supple, no JVD, carotid bruits, or masses. Cardiac: RRR, no murmurs, rubs,  or gallops. No clubbing, cyanosis, edema.  Radials/DP/PT 2+ and equal bilaterally.  Respiratory:  Respirations regular and unlabored, clear to auscultation bilaterally. GI: Soft, nontender, nondistended, BS + x 4. MS: no deformity or atrophy. Skin: warm and dry, no rash. Neuro:  Strength and sensation are intact. Psych: Normal affect.  Accessory Clinical Findings    Recent Labs: 03/05/2020: ALT 23; BUN 39; Creatinine, Ser 1.42; Hemoglobin 14.4; Platelets 192; Potassium 4.3; Sodium 141   Recent Lipid Panel    Component Value Date/Time   CHOL 148 10/12/2017 0826   TRIG 145 10/12/2017 0826   HDL 45 10/12/2017 0826   CHOLHDL 3.3 10/12/2017 0826   CHOLHDL 3.1 12/06/2015 0844   VLDL 22 12/06/2015 0844   LDLCALC 74 10/12/2017 0826    ECG personally reviewed by me today-normal sinus rhythm no ST or T wave deviation 84 bpm- No acute changes  EKG 06/06/2020 Sinus rhythm 73 with no ST and T wave deviation.  Cardiac catheterization 03/17/2015   Non-dominant Prox RCA lesion, 100% stenosed. Unchanged from prior.  Ost 2nd Diag lesion, jailed by the prior stent, 80% stenosed. It increased somewhat in severity but TIMI-3 flow was maintained. It was easily wired even after the stent was placed.  Mid LAD-1 lesion, 99% stenosed, focal in-stent restenosis. There is a 95% de novo lesion just past the distal edge of the stent. The  entire area was treated with a 3.5 x 28 Synergy drug-eluting stent, postdilated to 3.8 mm. Post intervention, there is a 0% residual stenosis.   Continue dual antiplatelet therapy for at least a year without interruption. The patient did rule in for a non-STEMI. He had already been taking clopidogrel so I did not change this. It would not be unreasonable to look at a more potent antiplatelet inhibitor such as Brilinta if he can handle this from a bleeding risk standpoint.  Continue aggressive secondary prevention. Diagnostic Dominance:  Left    Intervention       Assessment & Plan   1.  Accelerated heart rate-EKG today shows normal sinus rhythm no ST or T wave deviation 84 bpm.  Reports that he has noticed increased heart rate which she associates with his Synthroid.  TSH normal on last draw from 2016. Order  BMP, CBC Order 14-day ZIO monitor  Coronary artery disease-no chest pain today.  Cardiac catheterization 2001, 2004, and most recently 2016.  Details above. Continue aspirin, Plavix, co-Q10, metoprolol, pravastatin, valsartan, HCTZ Heart healthy low-sodium diet-salty 6 given Increase physical activity as tolerated  Essential hypertension-BP today 120/70.  Well-controlled at home. Continue metoprolol, valsartan, HCTZ Heart healthy low-sodium diet-salty 6 given Increase physical activity as tolerated  Hyperlipidemia-LDL 82 on 03/15/2020. Continue pravastatin Heart healthy low-sodium high-fiber diet Increase physical activity as tolerated   Disposition: Follow-up with Dr. [name] or me in 8 weeks.   Jossie Ng. Celines Femia NP-C    06/26/2020, 9:42 AM [address] Office [phone] Fax [phone]  Notice: This dictation was prepared with Dragon dictation along with smaller phrase technology. Any transcriptional errors that result from this process are unintentional and may not be corrected upon review.  I spent 12 minutes examining this patient, reviewing medications, and using patient centered shared decision making involving her cardiac care.  Prior to her visit I spent greater than 20 minutes reviewing her past medical history,  medications, and prior cardiac tests.

## 2020-06-26 ENCOUNTER — Other Ambulatory Visit: Payer: Self-pay | Admitting: General Practice

## 2020-06-26 ENCOUNTER — Ambulatory Visit (INDEPENDENT_AMBULATORY_CARE_PROVIDER_SITE_OTHER): Payer: Medicare PPO

## 2020-06-26 ENCOUNTER — Encounter: Payer: Self-pay | Admitting: General Practice

## 2020-06-26 ENCOUNTER — Ambulatory Visit: Payer: Medicare PPO | Admitting: General Practice

## 2020-06-26 ENCOUNTER — Other Ambulatory Visit: Payer: Self-pay

## 2020-06-26 ENCOUNTER — Telehealth: Payer: Self-pay

## 2020-06-26 VITALS — BP 120/70 | Ht 69.0 in | Wt 249.0 lb

## 2020-06-26 DIAGNOSIS — I2510 Atherosclerotic heart disease of native coronary artery without angina pectoris: Secondary | ICD-10-CM

## 2020-06-26 DIAGNOSIS — R000 Tachycardia, unspecified: Secondary | ICD-10-CM

## 2020-06-26 DIAGNOSIS — E782 Mixed hyperlipidemia: Secondary | ICD-10-CM | POA: Diagnosis not present

## 2020-06-26 DIAGNOSIS — Z79899 Other long term (current) drug therapy: Secondary | ICD-10-CM

## 2020-06-26 DIAGNOSIS — I10 Essential (primary) hypertension: Secondary | ICD-10-CM | POA: Diagnosis not present

## 2020-06-26 LAB — BASIC METABOLIC PANEL
BUN/Creatinine Ratio: 28 — ABNORMAL HIGH (ref 10–24)
BUN: 36 mg/dL — ABNORMAL HIGH (ref 8–27)
CO2: 24 mmol/L (ref 20–29)
Calcium: 10.3 mg/dL — ABNORMAL HIGH (ref 8.6–10.2)
Chloride: 102 mmol/L (ref 96–106)
Creatinine, Ser: 1.3 mg/dL — ABNORMAL HIGH (ref 0.76–1.27)
GFR calc Af Amer: 63 mL/min/{1.73_m2} (ref >59)
GFR calc non Af Amer: 54 mL/min/{1.73_m2} — ABNORMAL LOW (ref >59)
Glucose: 176 mg/dL — ABNORMAL HIGH (ref 65–99)
Potassium: 4.7 mmol/L (ref 3.5–5.2)
Sodium: 143 mmol/L (ref 134–144)

## 2020-06-26 LAB — CBC
Hematocrit: 36.6 % — ABNORMAL LOW (ref 37.5–51.0)
Hemoglobin: 12.1 g/dL — ABNORMAL LOW (ref 13.0–17.7)
MCH: 30.3 pg (ref 26.6–33.0)
MCHC: 33.1 g/dL (ref 31.5–35.7)
MCV: 92 fL (ref 79–97)
Platelets: 251 10*3/uL (ref 150–450)
RBC: 4 x10E6/uL — ABNORMAL LOW (ref 4.14–5.80)
RDW: 11.9 % (ref 11.6–15.4)
WBC: 6 10*3/uL (ref 3.4–10.8)

## 2020-06-26 NOTE — Telephone Encounter (Signed)
PLEASE MAIL TO: [address] [place], [place] [address]

## 2020-06-26 NOTE — Patient Instructions (Addendum)
Medication Instructions:  The current medical regimen is effective;  continue present plan and medications as directed. Please refer to the Current Medication list given to you today.  *If you need a refill on your cardiac medications before your next appointment, please call your pharmacy*  Lab Work: BMET AND CBC TODAY If you have labs (blood work) drawn today and your tests are completely normal, you will receive your results only by:  [place] (if you have MyChart) OR A paper copy in the mail.  If you have any lab test that is abnormal or we need to change your treatment, we will call you to review the results. You may go to any [facility] that is convenient for you however, we do have a lab in our office that is able to assist you. You DO NOT need an appointment for our lab. The lab is open 8:00am and closes at 4:00pm. Lunch 12:45 - 1:45pm.  Testing/Procedures: ZIO MONITOR SEE ATTACHED  Special Instructions Please try to avoid these triggers:  Do not use any products that have nicotine or tobacco in them. These include cigarettes, e-cigarettes, and chewing tobacco. If you need help quitting, ask your doctor.  Eat heart-healthy foods. Talk with your doctor about the right eating plan for you.  Exercise regularly as told by your doctor.  Stay hydrated  Do not drink alcohol, Caffeine or chocolate.  Lose weight if you are overweight.  Do not use drugs, including cannabis   Follow-Up: Your next appointment:  08-08-2020 @ 9:45AM  In Person with Quay Burow, MD   At [facility], you and your health needs are our priority.  As part of our continuing mission to provide you with exceptional heart care, we have created designated Provider Care Teams.  These Care Teams include your primary Cardiologist (physician) and Advanced Practice Providers (APPs -  Physician Assistants and Nurse Practitioners) who all work together to provide you with the care you need, when you need it.  We  recommend signing up for the patient portal called "MyChart".  Sign up information is provided on this After Visit Summary.  MyChart is used to connect with patients for Virtual Visits (Telemedicine).  Patients are able to view lab/test results, encounter notes, upcoming appointments, etc.  Non-urgent messages can be sent to your provider as well.   To learn more about what you can do with MyChart, go to [email or web address].     ZIO XT- Long Term Monitor Instructions   Your physician has requested you wear your ZIO patch monitor_______days.   This is a single patch monitor.  [employer] supplies one patch monitor per enrollment.  Additional stickers are not available.   Please do not apply patch if you will be having a Nuclear Stress Test, Echocardiogram, Cardiac CT, MRI, or Chest Xray during the time frame you would be wearing the monitor. The patch cannot be worn during these tests.  You cannot remove and re-apply the ZIO XT patch monitor.   Your ZIO patch monitor will be sent USPS Priority mail from [facility] directly to your home address. The monitor may also be mailed to a PO BOX if home delivery is not available.   It may take 3-5 days to receive your monitor after you have been enrolled.   Once you have received you monitor, please review enclosed instructions.  Your monitor has already been registered assigning a specific monitor serial # to you.   Applying the monitor   Shave hair from upper  left chest.   Hold abrader disc by orange tab.  Rub abrader in 40 strokes over left upper chest as indicated in your monitor instructions.   Clean area with 4 enclosed alcohol pads .  Use all pads to assure are is cleaned thoroughly.  Let dry.   Apply patch as indicated in monitor instructions.  Patch will be place under collarbone on left side of chest with arrow pointing upward.   Rub patch adhesive wings for 2 minutes.Remove white label marked "1".  Remove white label marked "2".   Rub patch adhesive wings for 2 additional minutes.   While looking in a mirror, press and release button in center of patch.  A small green light will flash 3-4 times .  This will be your only indicator the monitor has been turned on.     Do not shower for the first 24 hours.  You may shower after the first 24 hours.   Press button if you feel a symptom. You will hear a small click.  Record Date, Time and Symptom in the Patient Log Book.   When you are ready to remove patch, follow instructions on last 2 pages of Patient Log Book.  Stick patch monitor onto last page of Patient Log Book.   Place Patient Log Book in [place] box.  Use locking tab on box and tape box closed securely.  The [employer] has [employer] on it.  Please place in mailbox as soon as possible.  Your physician should have your test results approximately 7 days after the monitor has been mailed back to [facility].   Call [place] at [phone] if you have questions regarding your ZIO XT patch monitor.  Call them immediately if you see an orange light blinking on your monitor.   If your monitor falls off in less than 4 days contact our Monitor department at [phone].  If your monitor becomes loose or falls off after 4 days call [employer] at [phone] for suggestions on securing your monitor.

## 2020-06-30 DIAGNOSIS — R000 Tachycardia, unspecified: Secondary | ICD-10-CM | POA: Diagnosis not present

## 2020-07-20 DIAGNOSIS — R000 Tachycardia, unspecified: Secondary | ICD-10-CM | POA: Diagnosis not present

## 2020-07-24 DIAGNOSIS — M25531 Pain in right wrist: Secondary | ICD-10-CM | POA: Diagnosis not present

## 2020-08-02 ENCOUNTER — Other Ambulatory Visit: Payer: Self-pay | Admitting: Cardiovascular Disease

## 2020-08-03 DIAGNOSIS — I249 Acute ischemic heart disease, unspecified: Secondary | ICD-10-CM | POA: Diagnosis not present

## 2020-08-03 DIAGNOSIS — I2510 Atherosclerotic heart disease of native coronary artery without angina pectoris: Secondary | ICD-10-CM | POA: Diagnosis not present

## 2020-08-03 DIAGNOSIS — E7849 Other hyperlipidemia: Secondary | ICD-10-CM | POA: Diagnosis not present

## 2020-08-03 DIAGNOSIS — E1129 Type 2 diabetes mellitus with other diabetic kidney complication: Secondary | ICD-10-CM | POA: Diagnosis not present

## 2020-08-08 ENCOUNTER — Encounter: Payer: Self-pay | Admitting: Cardiovascular Disease

## 2020-08-08 ENCOUNTER — Ambulatory Visit: Payer: Medicare PPO | Admitting: Cardiovascular Disease

## 2020-08-08 ENCOUNTER — Other Ambulatory Visit: Payer: Self-pay

## 2020-08-08 DIAGNOSIS — Z9989 Dependence on other enabling machines and devices: Secondary | ICD-10-CM | POA: Diagnosis not present

## 2020-08-08 DIAGNOSIS — G4733 Obstructive sleep apnea (adult) (pediatric): Secondary | ICD-10-CM | POA: Diagnosis not present

## 2020-08-08 DIAGNOSIS — E782 Mixed hyperlipidemia: Secondary | ICD-10-CM | POA: Diagnosis not present

## 2020-08-08 DIAGNOSIS — I2510 Atherosclerotic heart disease of native coronary artery without angina pectoris: Secondary | ICD-10-CM | POA: Diagnosis not present

## 2020-08-08 DIAGNOSIS — I10 Essential (primary) hypertension: Secondary | ICD-10-CM

## 2020-08-08 NOTE — Assessment & Plan Note (Signed)
History of coronary artery disease status post stent implantation by Dr. Maurene Capes in 2001 and then by Dr. [name] in 2004.  He was admitted with unstable angina 02/14/2015 and underwent cardiac catheterization by Dr. [name].  His LAD stent had diffuse in-stent restenosis with a 95% edge stenosis which was restented with a 3.5 mm x 20 mm long Synergy drug-eluting stent.  He did have a jailed diagonal branch and a 75% first obtuse marginal branch stenosis as well as an occluded nondominant RCA.  He had a negative Myoview stress test 11/12/2016.  He denies chest pain or shortness of breath.  He is on dual antiplatelet therapy.

## 2020-08-08 NOTE — Assessment & Plan Note (Signed)
History of hyperlipidemia on statin therapy with lipid profile performed 03/13/2020 revealing total cholesterol 121, LDL of 82 and HDL of 33.

## 2020-08-08 NOTE — Progress Notes (Signed)
08/08/2020 Kevin [name]   [birth year]  [id number]  Primary Physician Sharilyn Sites, MD Primary Cardiologist: Lorretta Harp MD FACP, [place], [place], [place]  HPI:  Kevin [name] is a 75 y.o.   severely overweight divorced Caucasian male, father of 1 child, who I last saw in the office 06/06/2020. 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 He has a history of CAD, status post remote LAD and circumflex stenting in 2001 and 2004 respectively by Drs. [name] and [name]. He stopped smoking and drinking at that time as well. His other problems include hypertension, hyperlipidemia, and non-insulin-requiring diabetes. He also has obstructive sleep apnea, on CPAP. He has had lap-banding in the past with ultimate reaccumulation of his weight. He has had endovenous ablation by Dr. Elisabeth Cara of his left greater saphenous vein, which really did not afford significant clinical improvement. He had a functional study performed December 22, 2008, which was nonischemic, and another one June 24, 2011, because of chest pain, which was low risk. He has been asymptomatic.Dr. [name] recently checked his lipid profile last week as an outpatient. He was admitted to [facility] last month which has been shortness of breath. He underwent cardiac catheterization by Dr. Jeralene Peters Smith's revealing patent LAD and circumflex stents and a left dominant system with an occluded nondominant RCA. Medical therapy was recommended. He experimented with stopping his newly added diabetes medicines one of which apparently was contributing to his symptoms. He feels clinically improved after stopping this medication. Since I saw him in March 2016 he was admitted with unstable angina on 02/14/15 and underwent cardiac catheterization by Dr. [name] His LAD stent had diffuse in-stent restenosis with a 95% distal edge stenosis which was restented using a 3.5 x 28 mm long synergy drug-eluting stent. He did  have an ostial diagonal branch stenosis which was "jailedand a 75% first obtuse marginal branch stenosis as well as an occluded nondominant RCA.  Since I saw him in the office 2 months ago he was started on Synthroid at 100 mcg and noticed tachypalpitations.  An event monitor showed occasional PACs, PVCs and short runs of SVT.  His dose was lowered to 50 mcg and his palpitations have resolved.  He apparently needs both his shoulders replaced in the upcoming future which he will be cleared for at low risk.   Current Meds  Medication Sig  . Ascorbic Acid (VITAMIN C) 1000 MG tablet Take 1,000 mg by mouth daily.  [name] aspirin 81 MG tablet Take 81 mg by mouth daily.  . cetirizine (ZYRTEC) 10 MG tablet Take 10 mg by mouth daily.  . clopidogrel (PLAVIX) 75 MG tablet TAKE ONE TABLET BY MOUTH ONCE DAILY.  [name] Coenzyme Q10 (CO Q 10) 10 MG CAPS Take 1 capsule by mouth daily.  . diphenhydrAMINE (BENADRYL) 25 MG tablet Take 25 mg by mouth every 6 (six) hours as needed.  [name] EPINEPHrine (EPIPEN 2-PAK) 0.3 mg/0.3 mL IJ SOAJ injection Inject 0.3 mLs (0.3 mg total) into the muscle as needed for anaphylaxis.  . fluticasone (FLONASE) 50 MCG/ACT nasal spray Place 2 sprays into both nostrils daily.  . furosemide (LASIX) 40 MG tablet Take 40 mg by mouth daily.  [name] levothyroxine (SYNTHROID) 50 MCG tablet levothyroxine 50 mcg tablet  . metoprolol (TOPROL-XL) 100 MG 24 hr tablet Take 100 mg by mouth daily.  . niacin (NIASPAN) 1000 MG CR tablet TAKE 1 TABLET BY MOUTH AT  BEDTIME.  . pravastatin (PRAVACHOL) 40 MG tablet Take 1 tablet (40 mg total) by mouth every evening.  [name] RESVERATROL PO Take 1 capsule by mouth every morning.   . sitaGLIPtin (JANUVIA) 100 MG tablet Take 100 mg by mouth daily.  . tamsulosin (FLOMAX) 0.4 MG CAPS capsule Take 0.4 mg by mouth daily.  . valsartan-hydrochlorothiazide (DIOVAN-HCT) 160-12.5 MG per tablet Take 1 tablet by mouth daily.     Allergies  Allergen Reactions  . Altace [Ramipril]  Shortness Of Breath  . Avapro [Irbesartan] Shortness Of Breath  . Prednisone Anaphylaxis, Swelling and Other (See Comments)    SWELLING OF THE LIPS  . Shellfish Allergy Anaphylaxis    Shrimp, selfish  . Tradjenta [Linagliptin] Shortness Of Breath  . Cephalexin Hives and Swelling  . Clindamycin Hcl Hives and Swelling  . Codeine Nausea And Vomiting and Other (See Comments)    AFFECTS HEART  . Contrast Media [Iodinated Diagnostic Agents]     Can be administered if Benadryl is administered prior to prevent reaction   . Fenofibrate Other (See Comments)    "My skin started peeling off"  . Glycotrol [Carozyme] Other (See Comments)    unknown  . Indomethacin Other (See Comments)    UNKNOWN REACTION  . Minocycline Hcl Hives and Swelling  . Morphine And Related Nausea And Vomiting    PROJECTILE VOMITING  . Sulfa Drugs Cross Reactors Hives and Swelling  . Tetracyclines & Related   . Tree Extract Itching    Social History   Socioeconomic History  . Marital status: Divorced    Spouse name: Not on file  . Number of children: 1  . Years of education: Not on file  . Highest education level: Not on file  Occupational History  . Occupation: Leisure centre manager: [employer]    Comment: [place]  Tobacco Use  . Smoking status: Former Smoker    Packs/day: 3.00    Years: 30.00    Pack years: 90.00    Types: Cigarettes    Start date: 11/18/1964    Quit date: 10/04/1994    Years since quitting: 25.8  . Smokeless tobacco: Never Used  . Tobacco comment: quit smoking 19 years ago  Vaping Use  . Vaping Use: Never used  Substance and Sexual Activity  . Alcohol use: Yes    Alcohol/week: 0.0 standard drinks    Comment: 3-4 times/year  . Drug use: No  . Sexual activity: Not Currently  Other Topics Concern  . Not on file  Social History Narrative  . Not on file   Social Determinants of Health   Financial Resource Strain: Not on file  Food Insecurity: Not on file   Transportation Needs: Not on file  Physical Activity: Not on file  Stress: Not on file  Social Connections: Not on file  Intimate Partner Violence: Not on file     Review of Systems: General: negative for chills, fever, night sweats or weight changes.  Cardiovascular: negative for chest pain, dyspnea on exertion, edema, orthopnea, palpitations, paroxysmal nocturnal dyspnea or shortness of breath Dermatological: negative for rash Respiratory: negative for cough or wheezing Urologic: negative for hematuria Abdominal: negative for nausea, vomiting, diarrhea, bright red blood per rectum, melena, or hematemesis Neurologic: negative for visual changes, syncope, or dizziness All other systems reviewed and are otherwise negative except as noted above.    Blood pressure (!) 142/75, pulse 91, height 5' 9.5" (1.765 m), weight 245 lb 9.6 oz (111.4 kg),  SpO2 100 %.  General appearance: alert and no distress Neck: no adenopathy, no carotid bruit, no JVD, supple, symmetrical, trachea midline and thyroid not enlarged, symmetric, no tenderness/mass/nodules Lungs: clear to auscultation bilaterally Heart: regular rate and rhythm, S1, S2 normal, no murmur, click, rub or gallop Extremities: extremities normal, atraumatic, no cyanosis or edema Pulses: 2+ and symmetric Skin: Skin color, texture, turgor normal. No rashes or lesions Neurologic: Alert and oriented X 3, normal strength and tone. Normal symmetric reflexes. Normal coordination and gait  EKG not performed today  ASSESSMENT AND PLAN:   OSA on CPAP History of obstructive sleep apnea on CPAP  Coronary artery disease, hx LAD and LCX stenting in 2001 & 2004, last cath 2009 with patent stents. History of coronary artery disease status post stent implantation by Dr. Maurene Capes in 2001 and then by Dr. [name] in 2004.  He was admitted with unstable angina 02/14/2015 and underwent cardiac catheterization by Dr. [name].  His LAD stent had diffuse  in-stent restenosis with a 95% edge stenosis which was restented with a 3.5 mm x 20 mm long Synergy drug-eluting stent.  He did have a jailed diagonal branch and a 75% first obtuse marginal branch stenosis as well as an occluded nondominant RCA.  He had a negative Myoview stress test 11/12/2016.  He denies chest pain or shortness of breath.  He is on dual antiplatelet therapy.  Essential hypertension History of essential hypertension blood pressure measured today 142/75.  He is on metoprolol, and valsartan/hydrochlorothiazide.  Hyperlipidemia History of hyperlipidemia on statin therapy with lipid profile performed 03/13/2020 revealing total cholesterol 121, LDL of 82 and HDL of 33.      Lorretta Harp MD FACP,FACC,FAHA, [facility] 08/08/2020 9:41 AM

## 2020-08-08 NOTE — Patient Instructions (Signed)
Medication Instructions:  Your physician recommends that you continue on your current medications as directed. Please refer to the Current Medication list given to you today.  *If you need a refill on your cardiac medications before your next appointment, please call your pharmacy*   Follow-Up: At [facility], you and your health needs are our priority.  As part of our continuing mission to provide you with exceptional heart care, we have created designated Provider Care Teams.  These Care Teams include your primary Cardiologist (physician) and Advanced Practice Providers (APPs -  Physician Assistants and Nurse Practitioners) who all work together to provide you with the care you need, when you need it.  We recommend signing up for the patient portal called "MyChart".  Sign up information is provided on this After Visit Summary.  MyChart is used to connect with patients for Virtual Visits (Telemedicine).  Patients are able to view lab/test results, encounter notes, upcoming appointments, etc.  Non-urgent messages can be sent to your provider as well.   To learn more about what you can do with MyChart, go to [email or web address].    Your next appointment:   12 month(s)  The format for your next appointment:   In Person  Provider:   Quay Burow, MD   Other Instructions You are cleared at low risk for shoulder surgery.

## 2020-08-08 NOTE — Assessment & Plan Note (Signed)
History of obstructive sleep apnea on CPAP. 

## 2020-08-08 NOTE — Assessment & Plan Note (Signed)
History of essential hypertension blood pressure measured today 142/75.  He is on metoprolol, and valsartan/hydrochlorothiazide.

## 2020-08-09 ENCOUNTER — Telehealth: Payer: Self-pay

## 2020-08-09 ENCOUNTER — Telehealth: Payer: Self-pay | Admitting: Cardiovascular Disease

## 2020-08-09 NOTE — Telephone Encounter (Signed)
   [place] Pre-operative Risk Assessment    Patient Name: CHENG DEC  DOB: [birth year]  MRN: [id number]   HEARTCARE STAFF: - Please ensure there is not already an duplicate clearance open for this procedure. - Under Visit Info/Reason for Call, type in Other and utilize the format Clearance MM/DD/YY or Clearance TBD. Do not use dashes or single digits. - If request is for dental extraction, please clarify the # of teeth to be extracted.  Request for surgical clearance:  1. What type of surgery is being performed? 08/23/20    2. When is this surgery scheduled? Rotator Cuff Repair Rt shoulder   3. What type of clearance is required (medical clearance vs. Pharmacy clearance to hold med vs. Both)? Both  4. Are there any medications that need to be held prior to surgery and how long? ASA, Plavix, requesting instructions for Plavix    5. Practice name and name of physician performing surgery? [facility], Dr. Jonn Shingles  6. What is the office phone number? [phone]   7.   What is the office fax number? [phone]  8.   Anesthesia type (None, local, MAC, general) ? General    Jacqulynn Cadet 08/09/2020, 12:14 PM  _________________________________________________________________   (provider comments below)

## 2020-08-09 NOTE — Telephone Encounter (Signed)
Attempt to return call, line remains busy.  Routed to MD to review for Plavix instructions.  Cleared at low risk yesterday (see letter).

## 2020-08-09 NOTE — Telephone Encounter (Signed)
   Pt saw Dr. [name] yesterday and he said he cleared him for his upcoming procedure. He wanted to know when he can stop his plavix and also he wanted to make sure Dr. [name] told his surgeon about his sleep apnea

## 2020-08-09 NOTE — Telephone Encounter (Signed)
   Patient Name: Kevin [name]  DOB: 28-Oct-1945  MRN: [id number]   Primary Cardiologist: Quay Burow, MD  Chart reviewed as part of pre-operative protocol coverage. Given past medical history and time since last visit, based on ACC/AHA guidelines, NAHZIR POHLE would be at acceptable risk for the planned procedure without further cardiovascular testing.   The patient was advised that if he develops new symptoms prior to surgery to contact our office to arrange for a follow-up visit, and he verbalized understanding.  He may hold plavix for 5 days prior to the surgery and restart as soon as possible after the procedure at the surgeon's discretion.  I will route this recommendation to the requesting party via Epic fax function and remove from pre-op pool.  Please call with questions.  [place], [place] 08/09/2020, 2:02 PM

## 2020-08-10 DIAGNOSIS — R972 Elevated prostate specific antigen [PSA]: Secondary | ICD-10-CM | POA: Diagnosis not present

## 2020-08-10 NOTE — Telephone Encounter (Signed)
Spoke with pt on the phone regarding when to stop plavix prior to his shoulder surgery. Pt told to stop plavix 5 days prior to surgery and to resume when his surgeon tell him he can. Pt verbalizes understanding.

## 2020-08-16 DIAGNOSIS — R972 Elevated prostate specific antigen [PSA]: Secondary | ICD-10-CM | POA: Diagnosis not present

## 2020-08-16 DIAGNOSIS — N401 Enlarged prostate with lower urinary tract symptoms: Secondary | ICD-10-CM | POA: Diagnosis not present

## 2020-08-16 DIAGNOSIS — R3912 Poor urinary stream: Secondary | ICD-10-CM | POA: Diagnosis not present

## 2020-08-22 ENCOUNTER — Other Ambulatory Visit: Payer: Self-pay

## 2020-08-22 ENCOUNTER — Encounter (HOSPITAL_COMMUNITY): Payer: Self-pay | Admitting: Emergency Medicine

## 2020-08-22 ENCOUNTER — Emergency Department (HOSPITAL_COMMUNITY)
Admission: EM | Admit: 2020-08-22 | Discharge: 2020-08-22 | Disposition: A | Discharge status: Home / Self Care | Payer: Medicare PPO | Attending: Emergency Medicine | Admitting: Emergency Medicine

## 2020-08-22 ENCOUNTER — Emergency Department (HOSPITAL_COMMUNITY): Payer: Medicare PPO

## 2020-08-22 DIAGNOSIS — M79671 Pain in right foot: Secondary | ICD-10-CM | POA: Diagnosis not present

## 2020-08-22 DIAGNOSIS — I2510 Atherosclerotic heart disease of native coronary artery without angina pectoris: Secondary | ICD-10-CM | POA: Insufficient documentation

## 2020-08-22 DIAGNOSIS — I10 Essential (primary) hypertension: Secondary | ICD-10-CM | POA: Insufficient documentation

## 2020-08-22 DIAGNOSIS — J45909 Unspecified asthma, uncomplicated: Secondary | ICD-10-CM | POA: Diagnosis not present

## 2020-08-22 DIAGNOSIS — M19071 Primary osteoarthritis, right ankle and foot: Secondary | ICD-10-CM | POA: Diagnosis not present

## 2020-08-22 DIAGNOSIS — E119 Type 2 diabetes mellitus without complications: Secondary | ICD-10-CM | POA: Diagnosis not present

## 2020-08-22 DIAGNOSIS — Z87891 Personal history of nicotine dependence: Secondary | ICD-10-CM | POA: Diagnosis not present

## 2020-08-22 DIAGNOSIS — Z85828 Personal history of other malignant neoplasm of skin: Secondary | ICD-10-CM | POA: Diagnosis not present

## 2020-08-22 MED ORDER — HYDROCODONE-ACETAMINOPHEN 5-325 MG PO TABS: 1.0000 | ORAL_TABLET | ORAL | 0 refills | Status: DC | PRN

## 2020-08-22 NOTE — Discharge Instructions (Addendum)
You were evaluated in the Emergency Department and after careful evaluation, we did not find any emergent condition requiring admission or further testing in the hospital.  Your exam/testing today was overall reassuring.  X-ray does not show any broken bones. Symptoms seem consistent with gout.  Please take Tylenol at home for pain.  Can use the Norco tablets for more significant pain.  Recommend follow-up with your primary care doctor.  Please return to the Emergency Department if you experience any worsening of your condition.  Thank you for allowing [place] to be a part of your care.

## 2020-08-22 NOTE — ED Triage Notes (Signed)
Pt c/o right foot pain and swelling for a few days. Pt denies any injury.

## 2020-08-22 NOTE — ED Provider Notes (Addendum)
[facility] Emergency Department Provider Note MRN:  [id number]  Arrival date & time: 08/22/20     Chief Complaint   Foot Pain   History of Present Illness   Kevin [name] is a 75 y.o. year-old male with a history of hypertension, CAD, diabetes obesity presenting to the ED with chief complaint of foot pain.  1 to 2 days of right foot pain.  Denies any trauma.  Noted some swelling this morning.  No fever, no wounds, no leg pain, no other complaints.  No chest pain or shortness of breath.  Symptoms are mild to moderate, constant, worse with motion or palpation.  Worse with walking.  Review of Systems  A complete 10 system review of systems was obtained and all systems are negative except as noted in the HPI and PMH.   Patient's Health History    Past Medical History:  Diagnosis Date  . Angio-edema   . Asthma    as a child. no problems now.   [name] CAD (coronary artery disease)   . Cellulitis 5/11   left leg  . Diabetes mellitus   . Diverticulosis   . Diverticulosis   . HTN (hypertension)   . Hyperlipidemia   . Melanoma ([place]) 10/11   left foot  . Melanoma of foot ([place]) 06/02/2011  . Microscopic colitis 10/09/10   Colonoscopy dr [name]  . Obesity   . OSA on CPAP 01/20/2014  . Psoriasis   . S/P colonoscopy 07/03/03    Dr [name]->hyperplastic rectal polyp  . S/P endoscopy 10/09/10   lap band intact, duodenal erosions  . Seasonal allergies   . Sleep apnea   . Urticaria     Past Surgical History:  Procedure Laterality Date  . ABDOMINAL SURGERY    . ADENOIDECTOMY    . APPENDECTOMY    . CARDIAC CATHETERIZATION N/A 02/14/2015   Procedure: Left Heart Cath and Coronary Angiography;  Surgeon: Jettie Booze, MD;  Location: [facility];  Service: Cardiovascular;  Laterality: N/A;  . CARDIAC CATHETERIZATION N/A 02/14/2015   Procedure: Coronary Stent Intervention;  Surgeon: Jettie Booze, MD;  Location: [facility] [facility];  Service:  Cardiovascular;  Laterality: N/A;  . CORONARY ANGIOPLASTY  2002/2005/2008  . CORONARY ANGIOPLASTY WITH STENT PLACEMENT  06/11/2007   PTCA & stenting distal CX, PTCA & balloon angioplasty in-stent restenotic mid CX coronary artery stent  . KNEE SURGERY     left  . LEFT HEART CATHETERIZATION WITH CORONARY ANGIOGRAM N/A 07/15/2013   Procedure: LEFT HEART CATHETERIZATION WITH CORONARY ANGIOGRAM;  Surgeon: Sinclair Grooms, MD;  Location[facility];  Service: Cardiovascular;  Laterality: N/A;  . NM MYOCAR PERF WALL MOTION  06/24/2011   small mild fixed anteroseptal & anteroapical defects.  [name] PENILE PROSTHESIS IMPLANT    . SHOULDER SURGERY     left  . SINOSCOPY    . TONSILLECTOMY    . TYMPANOSTOMY TUBE PLACEMENT      Family History  Problem Relation Age of Onset  . Coronary artery disease Mother   . Diabetes Father   . Liver cancer Brother 80  . Celiac disease Other 70       brother's daughter  . Allergic rhinitis Neg Hx   . Angioedema Neg Hx   . Asthma Neg Hx   . Atopy Neg Hx   . Eczema Neg Hx   . Immunodeficiency Neg Hx   . Urticaria Neg Hx     Social History  Socioeconomic History  . Marital status: Divorced    Spouse name: Not on file  . Number of children: 1  . Years of education: Not on file  . Highest education level: Not on file  Occupational History  . 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 Smoking status: Former Smoker    Packs/day: 3.00    Years: 30.00    Pack years: 90.00    Types: Cigarettes    Start date: 11/18/1964    Quit date: 10/04/1994    Years since quitting: 25.9  . Smokeless tobacco: Never Used  . Tobacco comment: quit smoking 19 years ago  Vaping Use  . Vaping Use: Never used  Substance and Sexual Activity  . Alcohol use: Yes    Alcohol/week: 0.0 standard drinks    Comment: 3-4 times/year  . Drug use: No  . Sexual activity: Not Currently  Other Topics Concern  . Not on file  Social  History Narrative  . Not on file   Social Determinants of Health   Financial Resource Strain: Not on file  Food Insecurity: Not on file  Transportation Needs: Not on file  Physical Activity: Not on file  Stress: Not on file  Social Connections: Not on file  Intimate Partner Violence: Not on file     Physical Exam   Vitals:   08/22/20 0621  BP: (!) 122/54  Pulse: 89  Resp: 20  Temp: 97.7 F (36.5 C)  SpO2: 100%    CONSTITUTIONAL: Chronically ill-appearing, NAD NEURO:  Alert and oriented x 3, no focal deficits EYES:  eyes equal and reactive ENT/NECK:  no LAD, no JVD CARDIO: Regular rate, well-perfused, normal S1 and S2 PULM:  CTAB no wheezing or rhonchi GI/GU:  normal bowel sounds, non-distended, non-tender MSK/SPINE:  No gross deformities, 1+ edema to the right foot with focal tenderness to palpation to the lateral midfoot.  There is no erythema, there is no increased warmth, there is preserved range of motion.  There is no swelling to the leg SKIN:  no rash, atraumatic PSYCH:  Appropriate speech and behavior  *Additional and/or pertinent findings included in MDM below  Diagnostic and Interventional Summary    EKG Interpretation  Date/Time:    Ventricular Rate:    PR Interval:    QRS Duration:   QT Interval:    QTC Calculation:   R Axis:     Text Interpretation:        Labs Reviewed - No data to display  DG Foot Complete Right    (Results Pending)    Medications - No data to display   Procedures  /  Critical Care Procedures  ED Course and Medical Decision Making  I have reviewed the triage vital signs, the nursing notes, and pertinent available records from the EMR.  Listed above are laboratory and imaging tests that I personally ordered, reviewed, and interpreted and then considered in my medical decision making (see below for details).  Normal vital signs, no fever, no evidence of infection on exam, no evidence of DVT on exam.  Will obtain screening  x-ray to exclude evidence of trauma or bony abnormality.  Favoring gout.  Will provide pain control, plan is for discharge.  We will have to avoid NSAIDs and avoid steroids given his comorbidities and his upcoming surgery tomorrow.  Will follow-up with PCP.    Xray demonstrating evidence of gout.  Radiology commenting that  infection cannot be excluded but given the lack of clinical evidence of infection, gout is favored.  Appropriate for dc.   Barth Kirks. Sedonia Small, [place] [email or web address]  Final Clinical Impressions(s) / ED Diagnoses     ICD-10-CM   1. Right foot pain  M79.671     ED Discharge Orders    None       Discharge Instructions Discussed with and Provided to Patient:   Discharge Instructions   None       Maudie Flakes, MD 08/22/20 [id number]    Maudie Flakes, MD 08/22/20 [phone]

## 2020-08-23 DIAGNOSIS — S46191A Other injury of muscle, fascia and tendon of long head of biceps, right arm, initial encounter: Secondary | ICD-10-CM | POA: Diagnosis not present

## 2020-08-23 DIAGNOSIS — M19011 Primary osteoarthritis, right shoulder: Secondary | ICD-10-CM | POA: Diagnosis not present

## 2020-08-23 DIAGNOSIS — S46011A Strain of muscle(s) and tendon(s) of the rotator cuff of right shoulder, initial encounter: Secondary | ICD-10-CM | POA: Diagnosis not present

## 2020-08-23 DIAGNOSIS — G8918 Other acute postprocedural pain: Secondary | ICD-10-CM | POA: Diagnosis not present

## 2020-08-23 DIAGNOSIS — M75121 Complete rotator cuff tear or rupture of right shoulder, not specified as traumatic: Secondary | ICD-10-CM | POA: Diagnosis not present

## 2020-08-23 DIAGNOSIS — S46111A Strain of muscle, fascia and tendon of long head of biceps, right arm, initial encounter: Secondary | ICD-10-CM | POA: Diagnosis not present

## 2020-08-23 DIAGNOSIS — M7521 Bicipital tendinitis, right shoulder: Secondary | ICD-10-CM | POA: Diagnosis not present

## 2020-08-27 DIAGNOSIS — Z7984 Long term (current) use of oral hypoglycemic drugs: Secondary | ICD-10-CM | POA: Diagnosis not present

## 2020-08-27 DIAGNOSIS — E119 Type 2 diabetes mellitus without complications: Secondary | ICD-10-CM | POA: Diagnosis not present

## 2020-08-27 DIAGNOSIS — Z4789 Encounter for other orthopedic aftercare: Secondary | ICD-10-CM | POA: Diagnosis not present

## 2020-08-27 DIAGNOSIS — I110 Hypertensive heart disease with heart failure: Secondary | ICD-10-CM | POA: Diagnosis not present

## 2020-08-27 DIAGNOSIS — M75101 Unspecified rotator cuff tear or rupture of right shoulder, not specified as traumatic: Secondary | ICD-10-CM | POA: Diagnosis not present

## 2020-08-27 DIAGNOSIS — Z049 Encounter for examination and observation for unspecified reason: Secondary | ICD-10-CM | POA: Diagnosis not present

## 2020-08-27 DIAGNOSIS — I10 Essential (primary) hypertension: Secondary | ICD-10-CM | POA: Diagnosis not present

## 2020-08-27 DIAGNOSIS — R600 Localized edema: Secondary | ICD-10-CM | POA: Diagnosis not present

## 2020-08-27 DIAGNOSIS — I509 Heart failure, unspecified: Secondary | ICD-10-CM | POA: Diagnosis not present

## 2020-08-27 DIAGNOSIS — Z7982 Long term (current) use of aspirin: Secondary | ICD-10-CM | POA: Diagnosis not present

## 2020-08-30 DIAGNOSIS — M75101 Unspecified rotator cuff tear or rupture of right shoulder, not specified as traumatic: Secondary | ICD-10-CM | POA: Diagnosis not present

## 2020-08-30 DIAGNOSIS — Z4789 Encounter for other orthopedic aftercare: Secondary | ICD-10-CM | POA: Diagnosis not present

## 2020-09-03 DIAGNOSIS — M75101 Unspecified rotator cuff tear or rupture of right shoulder, not specified as traumatic: Secondary | ICD-10-CM | POA: Diagnosis not present

## 2020-09-03 DIAGNOSIS — Z4789 Encounter for other orthopedic aftercare: Secondary | ICD-10-CM | POA: Diagnosis not present

## 2020-09-05 DIAGNOSIS — Z4789 Encounter for other orthopedic aftercare: Secondary | ICD-10-CM | POA: Diagnosis not present

## 2020-09-05 DIAGNOSIS — M75101 Unspecified rotator cuff tear or rupture of right shoulder, not specified as traumatic: Secondary | ICD-10-CM | POA: Diagnosis not present

## 2020-09-06 DIAGNOSIS — Z4789 Encounter for other orthopedic aftercare: Secondary | ICD-10-CM | POA: Diagnosis not present

## 2020-09-10 DIAGNOSIS — Z4789 Encounter for other orthopedic aftercare: Secondary | ICD-10-CM | POA: Diagnosis not present

## 2020-09-10 DIAGNOSIS — M75101 Unspecified rotator cuff tear or rupture of right shoulder, not specified as traumatic: Secondary | ICD-10-CM | POA: Diagnosis not present

## 2020-09-11 DIAGNOSIS — G5603 Carpal tunnel syndrome, bilateral upper limbs: Secondary | ICD-10-CM | POA: Diagnosis not present

## 2020-09-11 DIAGNOSIS — M79641 Pain in right hand: Secondary | ICD-10-CM | POA: Diagnosis not present

## 2020-09-11 DIAGNOSIS — M79642 Pain in left hand: Secondary | ICD-10-CM | POA: Diagnosis not present

## 2020-09-11 DIAGNOSIS — M65322 Trigger finger, left index finger: Secondary | ICD-10-CM | POA: Diagnosis not present

## 2020-09-13 DIAGNOSIS — Z4789 Encounter for other orthopedic aftercare: Secondary | ICD-10-CM | POA: Diagnosis not present

## 2020-09-13 DIAGNOSIS — M75101 Unspecified rotator cuff tear or rupture of right shoulder, not specified as traumatic: Secondary | ICD-10-CM | POA: Diagnosis not present

## 2020-09-17 DIAGNOSIS — Z4789 Encounter for other orthopedic aftercare: Secondary | ICD-10-CM | POA: Diagnosis not present

## 2020-09-17 DIAGNOSIS — M75101 Unspecified rotator cuff tear or rupture of right shoulder, not specified as traumatic: Secondary | ICD-10-CM | POA: Diagnosis not present

## 2020-09-19 DIAGNOSIS — G5603 Carpal tunnel syndrome, bilateral upper limbs: Secondary | ICD-10-CM | POA: Diagnosis not present

## 2020-09-19 DIAGNOSIS — E1142 Type 2 diabetes mellitus with diabetic polyneuropathy: Secondary | ICD-10-CM | POA: Diagnosis not present

## 2020-09-20 DIAGNOSIS — M75101 Unspecified rotator cuff tear or rupture of right shoulder, not specified as traumatic: Secondary | ICD-10-CM | POA: Diagnosis not present

## 2020-09-20 DIAGNOSIS — Z4789 Encounter for other orthopedic aftercare: Secondary | ICD-10-CM | POA: Diagnosis not present

## 2020-09-21 DIAGNOSIS — G5603 Carpal tunnel syndrome, bilateral upper limbs: Secondary | ICD-10-CM | POA: Diagnosis not present

## 2020-09-21 DIAGNOSIS — M79641 Pain in right hand: Secondary | ICD-10-CM | POA: Diagnosis not present

## 2020-09-21 DIAGNOSIS — E1142 Type 2 diabetes mellitus with diabetic polyneuropathy: Secondary | ICD-10-CM | POA: Diagnosis not present

## 2020-09-21 DIAGNOSIS — M65322 Trigger finger, left index finger: Secondary | ICD-10-CM | POA: Diagnosis not present

## 2020-09-24 ENCOUNTER — Encounter (HOSPITAL_COMMUNITY): Payer: Self-pay | Admitting: *Deleted

## 2020-09-24 ENCOUNTER — Emergency Department (HOSPITAL_COMMUNITY)
Admission: EM | Admit: 2020-09-24 | Discharge: 2020-09-24 | Disposition: A | Discharge status: Home / Self Care | Payer: Medicare PPO | Attending: Emergency Medicine | Admitting: Emergency Medicine

## 2020-09-24 ENCOUNTER — Emergency Department (HOSPITAL_COMMUNITY): Payer: Medicare PPO

## 2020-09-24 ENCOUNTER — Other Ambulatory Visit: Payer: Self-pay

## 2020-09-24 DIAGNOSIS — R609 Edema, unspecified: Secondary | ICD-10-CM | POA: Diagnosis not present

## 2020-09-24 DIAGNOSIS — Z7984 Long term (current) use of oral hypoglycemic drugs: Secondary | ICD-10-CM | POA: Insufficient documentation

## 2020-09-24 DIAGNOSIS — Z87891 Personal history of nicotine dependence: Secondary | ICD-10-CM | POA: Insufficient documentation

## 2020-09-24 DIAGNOSIS — E785 Hyperlipidemia, unspecified: Secondary | ICD-10-CM | POA: Diagnosis not present

## 2020-09-24 DIAGNOSIS — Z7982 Long term (current) use of aspirin: Secondary | ICD-10-CM | POA: Diagnosis not present

## 2020-09-24 DIAGNOSIS — I10 Essential (primary) hypertension: Secondary | ICD-10-CM | POA: Diagnosis not present

## 2020-09-24 DIAGNOSIS — Z79899 Other long term (current) drug therapy: Secondary | ICD-10-CM | POA: Diagnosis not present

## 2020-09-24 DIAGNOSIS — E1169 Type 2 diabetes mellitus with other specified complication: Secondary | ICD-10-CM | POA: Insufficient documentation

## 2020-09-24 DIAGNOSIS — J45909 Unspecified asthma, uncomplicated: Secondary | ICD-10-CM | POA: Insufficient documentation

## 2020-09-24 DIAGNOSIS — Z7902 Long term (current) use of antithrombotics/antiplatelets: Secondary | ICD-10-CM | POA: Insufficient documentation

## 2020-09-24 DIAGNOSIS — R0602 Shortness of breath: Secondary | ICD-10-CM | POA: Diagnosis not present

## 2020-09-24 DIAGNOSIS — Z85820 Personal history of malignant melanoma of skin: Secondary | ICD-10-CM | POA: Insufficient documentation

## 2020-09-24 DIAGNOSIS — M7989 Other specified soft tissue disorders: Secondary | ICD-10-CM | POA: Diagnosis not present

## 2020-09-24 DIAGNOSIS — I2510 Atherosclerotic heart disease of native coronary artery without angina pectoris: Secondary | ICD-10-CM | POA: Insufficient documentation

## 2020-09-24 DIAGNOSIS — R600 Localized edema: Secondary | ICD-10-CM | POA: Diagnosis not present

## 2020-09-24 LAB — CBC WITH DIFFERENTIAL/PLATELET
Abs Immature Granulocytes: 0.02 10*3/uL (ref 0.00–0.07)
Basophils Absolute: 0.1 10*3/uL (ref 0.0–0.1)
Basophils Relative: 1 %
Eosinophils Absolute: 0.3 10*3/uL (ref 0.0–0.5)
Eosinophils Relative: 5 %
HCT: 33.1 % — ABNORMAL LOW (ref 39.0–52.0)
Hemoglobin: 10.5 g/dL — ABNORMAL LOW (ref 13.0–17.0)
Immature Granulocytes: 0 %
Lymphocytes Relative: 26 %
Lymphs Abs: 1.3 10*3/uL (ref 0.7–4.0)
MCH: 30.1 pg (ref 26.0–34.0)
MCHC: 31.7 g/dL (ref 30.0–36.0)
MCV: 94.8 fL (ref 80.0–100.0)
Monocytes Absolute: 0.5 10*3/uL (ref 0.1–1.0)
Monocytes Relative: 10 %
Neutro Abs: 2.9 10*3/uL (ref 1.7–7.7)
Neutrophils Relative %: 58 %
Platelets: 241 10*3/uL (ref 150–400)
RBC: 3.49 MIL/uL — ABNORMAL LOW (ref 4.22–5.81)
RDW: 13 % (ref 11.5–15.5)
WBC: 5.1 10*3/uL (ref 4.0–10.5)
nRBC: 0 % (ref 0.0–0.2)

## 2020-09-24 LAB — COMPREHENSIVE METABOLIC PANEL
ALT: 14 U/L (ref 0–44)
AST: 14 U/L — ABNORMAL LOW (ref 15–41)
Albumin: 3.5 g/dL (ref 3.5–5.0)
Alkaline Phosphatase: 51 U/L (ref 38–126)
Anion gap: 9 (ref 5–15)
BUN: 27 mg/dL — ABNORMAL HIGH (ref 8–23)
CO2: 25 mmol/L (ref 22–32)
Calcium: 9.4 mg/dL (ref 8.9–10.3)
Chloride: 103 mmol/L (ref 98–111)
Creatinine, Ser: 1.1 mg/dL (ref 0.61–1.24)
GFR, Estimated: >60 mL/min (ref >60)
Glucose, Bld: 159 mg/dL — ABNORMAL HIGH (ref 70–99)
Potassium: 3.8 mmol/L (ref 3.5–5.1)
Sodium: 137 mmol/L (ref 135–145)
Total Bilirubin: 0.7 mg/dL (ref 0.3–1.2)
Total Protein: 6.9 g/dL (ref 6.5–8.1)

## 2020-09-24 LAB — BRAIN NATRIURETIC PEPTIDE: B Natriuretic Peptide: 34 pg/mL (ref 0.0–100.0)

## 2020-09-24 NOTE — ED Provider Notes (Addendum)
Emergency Medicine Provider Triage Evaluation Note  Kevin [name] , a 75 y.o. male  was evaluated in triage.  Pt complains of leg swelling. He has numerous medical problems, recently had R shoulder surgery. Has been doing rehab in [place] where his family lives. Swelling has been there since just after surgery. Tried to talk to PCP but was told to go to the ED.   Review of Systems  Positive: Leg swelling Negative:   Physical Exam  BP (!) 143/53 (BP Location: Left Arm)   Pulse 68   Temp 98 F (36.7 C) (Oral)   Resp 14   SpO2 99%  Gen:   Awake, no distress   Resp:  Normal effort  MSK:   3+ BLE edema to the knees, symmetric Other:  R arm in sling  Medical Decision Making  Medically screening exam initiated at 8:16 PM.  Appropriate orders placed.  Kevin [name] was informed that the remainder of the evaluation will be completed by another provider, this initial triage assessment does not replace that evaluation, and the importance of remaining in the ED until their evaluation is complete.     Kevin Hidden, MD 09/24/20 2018    Kevin Hidden, MD 09/24/20 2039

## 2020-09-24 NOTE — ED Triage Notes (Signed)
Leg swelling x 3 weeks

## 2020-09-24 NOTE — ED Provider Notes (Signed)
[facility] Provider Note  CSN: [id number] Arrival date & time: 09/24/20 1741    History Chief Complaint  Patient presents with  . Leg Swelling    HPI  BEREKET GERNERT is a 75 y.o. male presents to the ED for evaluation of bilateral LE edema ongoing for several weeks. Started after he had a shoulder surgery about a month ago after which he was advised to sit in a recliner. He has known neuropathy and also recently diagnosed with carpal tunnel syndrome. He wants to be admitted to the hospital to take care of all his various problems. He has not had any CP, SOB, fever, cough. He takes a diuretic, no change in dose. He had been with family in [place] after his surgery but they brought him back to [place] today and dropped him off at the hospital.    Past Medical History:  Diagnosis Date  . Angio-edema   . Asthma    as a child. no problems now.   [name] CAD (coronary artery disease)   . Cellulitis 5/11   left leg  . Diabetes mellitus   . Diverticulosis   . Diverticulosis   . HTN (hypertension)   . Hyperlipidemia   . Melanoma ([place]) 10/11   left foot  . Melanoma of foot ([place]) 06/02/2011  . Microscopic colitis 10/09/10   Colonoscopy dr [name]  . Obesity   . OSA on CPAP 01/20/2014  . Psoriasis   . S/P colonoscopy 07/03/03    Dr [name]->hyperplastic rectal polyp  . S/P endoscopy 10/09/10   lap band intact, duodenal erosions  . Seasonal allergies   . Sleep apnea   . Urticaria     Past Surgical History:  Procedure Laterality Date  . ABDOMINAL SURGERY    . ADENOIDECTOMY    . APPENDECTOMY    . CARDIAC CATHETERIZATION N/A 02/14/2015   Procedure: Left Heart Cath and Coronary Angiography;  Surgeon: Jettie Booze, MD;  Location: [facility];  Service: Cardiovascular;  Laterality: N/A;  . CARDIAC CATHETERIZATION N/A 02/14/2015   Procedure: Coronary Stent Intervention;  Surgeon: Jettie Booze, MD;  Location: [facility];  Service:  Cardiovascular;  Laterality: N/A;  . CORONARY ANGIOPLASTY  2002/2005/2008  . CORONARY ANGIOPLASTY WITH STENT PLACEMENT  06/11/2007   PTCA & stenting distal CX, PTCA & balloon angioplasty in-stent restenotic mid CX coronary artery stent  . KNEE SURGERY     left  . LEFT HEART CATHETERIZATION WITH CORONARY ANGIOGRAM N/A 07/15/2013   Procedure: LEFT HEART CATHETERIZATION WITH CORONARY ANGIOGRAM;  Surgeon: Sinclair Grooms, MD;  Location: [facility] CATH [facility];  Service: Cardiovascular;  Laterality: N/A;  . NM MYOCAR PERF WALL MOTION  06/24/2011   small mild fixed anteroseptal & anteroapical defects.  [name] PENILE PROSTHESIS IMPLANT    . SHOULDER SURGERY     left  . SINOSCOPY    . TONSILLECTOMY    . TYMPANOSTOMY TUBE PLACEMENT      Family History  Problem Relation Age of Onset  . Coronary artery disease Mother   . Diabetes Father   . Liver cancer Brother 20  . Celiac disease Other 56       brother's daughter  . Allergic rhinitis Neg Hx   . Angioedema Neg Hx   . Asthma Neg Hx   . Atopy Neg Hx   . Eczema Neg Hx   . Immunodeficiency Neg Hx   . Urticaria Neg Hx     Social  History   Tobacco Use  . Smoking status: Former Smoker    Packs/day: 3.00    Years: 30.00    Pack years: 90.00    Types: Cigarettes    Start date: 11/18/1964    Quit date: 10/04/1994    Years since quitting: 25.9  . Smokeless tobacco: Never Used  . Tobacco comment: quit smoking 19 years ago  Vaping Use  . Vaping Use: Never used  Substance Use Topics  . Alcohol use: Yes    Alcohol/week: 0.0 standard drinks    Comment: 3-4 times/year  . Drug use: No     Home Medications Prior to Admission medications   Medication Sig Start Date End Date Taking? Authorizing Provider  Ascorbic Acid (VITAMIN C) 1000 MG tablet Take 1,000 mg by mouth daily.    [provider]  aspirin 81 MG tablet Take 81 mg by mouth daily.    [provider]  cetirizine (ZYRTEC) 10 MG tablet Take 10 mg by mouth daily.    [provider]  clopidogrel (PLAVIX) 75 MG tablet TAKE ONE TABLET BY MOUTH ONCE DAILY. 06/11/20   Lorretta Harp, MD  Coenzyme Q10 (CO Q 10) 10 MG CAPS Take 1 capsule by mouth daily.    [provider]  diphenhydrAMINE (BENADRYL) 25 MG tablet Take 25 mg by mouth every 6 (six) hours as needed.    [provider]  EPINEPHrine (EPIPEN 2-PAK) 0.3 mg/0.3 mL IJ SOAJ injection Inject 0.3 mLs (0.3 mg total) into the muscle as needed for anaphylaxis. 01/12/19   Valentina Shaggy, MD  fluticasone [facility]) 50 MCG/ACT nasal spray Place 2 sprays into both nostrils daily.    [provider]  furosemide (LASIX) 40 MG tablet Take 40 mg by mouth daily.    [provider]  HYDROcodone-acetaminophen (NORCO/VICODIN) 5-325 MG tablet Take 1 tablet by mouth every 4 (four) hours as needed. 08/22/20   Maudie Flakes, MD  levothyroxine (SYNTHROID) 50 MCG tablet levothyroxine 50 mcg tablet    [provider]  metoprolol (TOPROL-XL) 100 MG 24 hr tablet Take 100 mg by mouth daily.    [provider]  niacin (NIASPAN) 1000 MG CR tablet TAKE 1 TABLET BY MOUTH AT BEDTIME. 08/02/20   Lorretta Harp, MD  pravastatin (PRAVACHOL) 40 MG tablet Take 1 tablet (40 mg total) by mouth every evening. 04/18/19   Lorretta Harp, MD  RESVERATROL PO Take 1 capsule by mouth every morning.     [provider]  sitaGLIPtin (JANUVIA) 100 MG tablet Take 100 mg by mouth daily.    [provider]  tamsulosin (FLOMAX) 0.4 MG CAPS capsule Take 0.4 mg by mouth daily. 07/17/20   [provider]  valsartan-hydrochlorothiazide (DIOVAN-HCT) 160-12.5 MG per tablet Take 1 tablet by mouth daily.    [provider]     Allergies    Altace [ramipril], Avapro [irbesartan], Prednisone, Shellfish allergy, Tradjenta [linagliptin], Cephalexin, Clindamycin hcl, Codeine, Contrast media [iodinated diagnostic agents], Fenofibrate, Glycotrol [support-500], Indomethacin,  Minocycline hcl, Morphine and related, Sulfa drugs cross reactors, Tetracyclines & related, and Tree extract   Review of Systems   Review of Systems A comprehensive review of systems was completed and negative except as noted in HPI.    Physical Exam BP 129/69   Pulse 68   Temp 98 F (36.7 C) (Oral)   Resp 15   SpO2 100%   Physical Exam Vitals and nursing note reviewed.  Constitutional:  Appearance: Normal appearance.  HENT:     Head: Normocephalic and atraumatic.     Nose: Nose normal.     Mouth/Throat:     Mouth: Mucous membranes are moist.  Eyes:     Extraocular Movements: Extraocular movements intact.     Conjunctiva/sclera: Conjunctivae normal.  Cardiovascular:     Rate and Rhythm: Normal rate.  Pulmonary:     Effort: Pulmonary effort is normal.     Breath sounds: Normal breath sounds.  Abdominal:     General: Abdomen is flat.     Palpations: Abdomen is soft.     Tenderness: There is no abdominal tenderness.  Musculoskeletal:        General: No swelling. Normal range of motion.     Cervical back: Neck supple.     Comments: 3+ BLE edema to the knees, symmetric, no signs of infection  Skin:    General: Skin is warm and dry.  Neurological:     General: No focal deficit present.     Mental Status: He is alert.  Psychiatric:        Mood and Affect: Mood normal.      ED Results / Procedures / Treatments   Labs (all labs ordered are listed, but only abnormal results are displayed) Labs Reviewed  COMPREHENSIVE METABOLIC PANEL - Abnormal; Notable for the following components:      Result Value   Glucose, Bld 159 (*)    BUN 27 (*)    AST 14 (*)    All other components within normal limits  CBC WITH DIFFERENTIAL/PLATELET - Abnormal; Notable for the following components:   RBC 3.49 (*)    Hemoglobin 10.5 (*)    HCT 33.1 (*)    All other components within normal limits  BRAIN NATRIURETIC PEPTIDE    EKG EKG Interpretation  Date/Time:  Monday Sep 24 2020 20:38:09 EDT Ventricular Rate:  63 PR Interval:  61 QRS Duration: 147 QT Interval:  394 QTC Calculation: 404 R Axis:   -23 Text Interpretation: Sinus rhythm Short PR interval Nonspecific intraventricular conduction delay No significant change since last tracing Confirmed by Calvert Cantor [phone]) on 09/24/2020 8:43:42 PM   Radiology DG Chest 2 View  Result Date: 09/24/2020 CLINICAL DATA:  Shortness of breath and leg swelling, progressive. EXAM: CHEST - 2 VIEW COMPARISON:  09/04/2017 FINDINGS: The cardiomediastinal contours are normal. Aortic atherosclerosis. The lungs are clear. Pulmonary vasculature is normal. No consolidation, pleural effusion, or pneumothorax. No acute osseous abnormalities are seen. IMPRESSION: No acute chest findings. Electronically Signed   By: Keith Rake M.D.   On: 09/24/2020 21:01    Procedures Procedures  Medications Ordered in the ED Medications - No data to display   MDM Rules/Calculators/A&P MDM Patient with LE edema, will check labs, EKG and CXR to evaluate cause. Advised that absent an acute indication for admission, we could not put him in the hospital for his swelling.  ED Course  I have reviewed the triage vital signs and the nursing notes.  Pertinent labs & imaging results that were available during my care of the patient were reviewed by me and considered in my medical decision making (see chart for details).  Clinical Course as of 09/24/20 2343  Mon Sep 24, 2020  2127 CXR is clear, no signs of pulm edema.  [CS]  2325 Patient's CBC CMP and BNP are all unremarkable. No life threatening cause of peripheral edema is found. He was advised to discuss his swelling with  PCP including whether to increase his diuretic. Advised to keep his legs elevated as much as possible and use compression stockings to help with swelling.  [CS]    Clinical Course User Index [CS] Truddie Hidden, MD    Final Clinical Impression(s) / ED  Diagnoses Final diagnoses:  Peripheral edema    Rx / DC Orders ED Discharge Orders    None       Truddie Hidden, MD 09/24/20 [phone]

## 2020-09-26 DIAGNOSIS — M25531 Pain in right wrist: Secondary | ICD-10-CM | POA: Diagnosis not present

## 2020-09-26 DIAGNOSIS — R531 Weakness: Secondary | ICD-10-CM | POA: Diagnosis not present

## 2020-09-26 DIAGNOSIS — R202 Paresthesia of skin: Secondary | ICD-10-CM | POA: Diagnosis not present

## 2020-09-26 DIAGNOSIS — M79642 Pain in left hand: Secondary | ICD-10-CM | POA: Diagnosis not present

## 2020-09-26 DIAGNOSIS — M25441 Effusion, right hand: Secondary | ICD-10-CM | POA: Diagnosis not present

## 2020-09-26 DIAGNOSIS — M79641 Pain in right hand: Secondary | ICD-10-CM | POA: Diagnosis not present

## 2020-09-26 DIAGNOSIS — M25641 Stiffness of right hand, not elsewhere classified: Secondary | ICD-10-CM | POA: Diagnosis not present

## 2020-09-26 DIAGNOSIS — M25532 Pain in left wrist: Secondary | ICD-10-CM | POA: Diagnosis not present

## 2020-09-26 DIAGNOSIS — E1142 Type 2 diabetes mellitus with diabetic polyneuropathy: Secondary | ICD-10-CM | POA: Diagnosis not present

## 2020-09-27 DIAGNOSIS — M1991 Primary osteoarthritis, unspecified site: Secondary | ICD-10-CM | POA: Diagnosis not present

## 2020-09-27 DIAGNOSIS — M75101 Unspecified rotator cuff tear or rupture of right shoulder, not specified as traumatic: Secondary | ICD-10-CM | POA: Diagnosis not present

## 2020-09-27 DIAGNOSIS — Z6838 Body mass index (BMI) 38.0-38.9, adult: Secondary | ICD-10-CM | POA: Diagnosis not present

## 2020-09-28 DIAGNOSIS — M799 Soft tissue disorder, unspecified: Secondary | ICD-10-CM | POA: Diagnosis not present

## 2020-09-28 DIAGNOSIS — M6281 Muscle weakness (generalized): Secondary | ICD-10-CM | POA: Diagnosis not present

## 2020-09-28 DIAGNOSIS — M25511 Pain in right shoulder: Secondary | ICD-10-CM | POA: Diagnosis not present

## 2020-09-28 DIAGNOSIS — Z4789 Encounter for other orthopedic aftercare: Secondary | ICD-10-CM | POA: Diagnosis not present

## 2020-09-28 DIAGNOSIS — M25611 Stiffness of right shoulder, not elsewhere classified: Secondary | ICD-10-CM | POA: Diagnosis not present

## 2020-10-02 DIAGNOSIS — M79642 Pain in left hand: Secondary | ICD-10-CM | POA: Diagnosis not present

## 2020-10-02 DIAGNOSIS — M25441 Effusion, right hand: Secondary | ICD-10-CM | POA: Diagnosis not present

## 2020-10-02 DIAGNOSIS — M25532 Pain in left wrist: Secondary | ICD-10-CM | POA: Diagnosis not present

## 2020-10-02 DIAGNOSIS — M25641 Stiffness of right hand, not elsewhere classified: Secondary | ICD-10-CM | POA: Diagnosis not present

## 2020-10-02 DIAGNOSIS — M25531 Pain in right wrist: Secondary | ICD-10-CM | POA: Diagnosis not present

## 2020-10-02 DIAGNOSIS — E1142 Type 2 diabetes mellitus with diabetic polyneuropathy: Secondary | ICD-10-CM | POA: Diagnosis not present

## 2020-10-02 DIAGNOSIS — M79641 Pain in right hand: Secondary | ICD-10-CM | POA: Diagnosis not present

## 2020-10-02 DIAGNOSIS — R531 Weakness: Secondary | ICD-10-CM | POA: Diagnosis not present

## 2020-10-02 DIAGNOSIS — R202 Paresthesia of skin: Secondary | ICD-10-CM | POA: Diagnosis not present

## 2020-10-03 DIAGNOSIS — M25531 Pain in right wrist: Secondary | ICD-10-CM | POA: Diagnosis not present

## 2020-10-03 DIAGNOSIS — E1142 Type 2 diabetes mellitus with diabetic polyneuropathy: Secondary | ICD-10-CM | POA: Diagnosis not present

## 2020-10-03 DIAGNOSIS — M79642 Pain in left hand: Secondary | ICD-10-CM | POA: Diagnosis not present

## 2020-10-03 DIAGNOSIS — M25641 Stiffness of right hand, not elsewhere classified: Secondary | ICD-10-CM | POA: Diagnosis not present

## 2020-10-03 DIAGNOSIS — M25532 Pain in left wrist: Secondary | ICD-10-CM | POA: Diagnosis not present

## 2020-10-03 DIAGNOSIS — M79641 Pain in right hand: Secondary | ICD-10-CM | POA: Diagnosis not present

## 2020-10-03 DIAGNOSIS — R531 Weakness: Secondary | ICD-10-CM | POA: Diagnosis not present

## 2020-10-03 DIAGNOSIS — R202 Paresthesia of skin: Secondary | ICD-10-CM | POA: Diagnosis not present

## 2020-10-03 DIAGNOSIS — M25441 Effusion, right hand: Secondary | ICD-10-CM | POA: Diagnosis not present

## 2020-10-04 DIAGNOSIS — M6281 Muscle weakness (generalized): Secondary | ICD-10-CM | POA: Diagnosis not present

## 2020-10-04 DIAGNOSIS — M799 Soft tissue disorder, unspecified: Secondary | ICD-10-CM | POA: Diagnosis not present

## 2020-10-04 DIAGNOSIS — M25611 Stiffness of right shoulder, not elsewhere classified: Secondary | ICD-10-CM | POA: Diagnosis not present

## 2020-10-04 DIAGNOSIS — M25511 Pain in right shoulder: Secondary | ICD-10-CM | POA: Diagnosis not present

## 2020-10-04 DIAGNOSIS — Z4789 Encounter for other orthopedic aftercare: Secondary | ICD-10-CM | POA: Diagnosis not present

## 2020-10-08 DIAGNOSIS — M799 Soft tissue disorder, unspecified: Secondary | ICD-10-CM | POA: Diagnosis not present

## 2020-10-08 DIAGNOSIS — E1142 Type 2 diabetes mellitus with diabetic polyneuropathy: Secondary | ICD-10-CM | POA: Diagnosis not present

## 2020-10-08 DIAGNOSIS — M79642 Pain in left hand: Secondary | ICD-10-CM | POA: Diagnosis not present

## 2020-10-08 DIAGNOSIS — M25531 Pain in right wrist: Secondary | ICD-10-CM | POA: Diagnosis not present

## 2020-10-08 DIAGNOSIS — M25441 Effusion, right hand: Secondary | ICD-10-CM | POA: Diagnosis not present

## 2020-10-08 DIAGNOSIS — M25532 Pain in left wrist: Secondary | ICD-10-CM | POA: Diagnosis not present

## 2020-10-08 DIAGNOSIS — R531 Weakness: Secondary | ICD-10-CM | POA: Diagnosis not present

## 2020-10-08 DIAGNOSIS — M25611 Stiffness of right shoulder, not elsewhere classified: Secondary | ICD-10-CM | POA: Diagnosis not present

## 2020-10-08 DIAGNOSIS — M79641 Pain in right hand: Secondary | ICD-10-CM | POA: Diagnosis not present

## 2020-10-08 DIAGNOSIS — M25511 Pain in right shoulder: Secondary | ICD-10-CM | POA: Diagnosis not present

## 2020-10-08 DIAGNOSIS — Z4789 Encounter for other orthopedic aftercare: Secondary | ICD-10-CM | POA: Diagnosis not present

## 2020-10-08 DIAGNOSIS — M25641 Stiffness of right hand, not elsewhere classified: Secondary | ICD-10-CM | POA: Diagnosis not present

## 2020-10-08 DIAGNOSIS — R202 Paresthesia of skin: Secondary | ICD-10-CM | POA: Diagnosis not present

## 2020-10-08 DIAGNOSIS — M6281 Muscle weakness (generalized): Secondary | ICD-10-CM | POA: Diagnosis not present

## 2020-10-10 DIAGNOSIS — M25531 Pain in right wrist: Secondary | ICD-10-CM | POA: Diagnosis not present

## 2020-10-10 DIAGNOSIS — E1142 Type 2 diabetes mellitus with diabetic polyneuropathy: Secondary | ICD-10-CM | POA: Diagnosis not present

## 2020-10-10 DIAGNOSIS — M799 Soft tissue disorder, unspecified: Secondary | ICD-10-CM | POA: Diagnosis not present

## 2020-10-10 DIAGNOSIS — M25511 Pain in right shoulder: Secondary | ICD-10-CM | POA: Diagnosis not present

## 2020-10-10 DIAGNOSIS — M79642 Pain in left hand: Secondary | ICD-10-CM | POA: Diagnosis not present

## 2020-10-10 DIAGNOSIS — M6281 Muscle weakness (generalized): Secondary | ICD-10-CM | POA: Diagnosis not present

## 2020-10-10 DIAGNOSIS — M25641 Stiffness of right hand, not elsewhere classified: Secondary | ICD-10-CM | POA: Diagnosis not present

## 2020-10-10 DIAGNOSIS — R202 Paresthesia of skin: Secondary | ICD-10-CM | POA: Diagnosis not present

## 2020-10-10 DIAGNOSIS — M25611 Stiffness of right shoulder, not elsewhere classified: Secondary | ICD-10-CM | POA: Diagnosis not present

## 2020-10-10 DIAGNOSIS — M79641 Pain in right hand: Secondary | ICD-10-CM | POA: Diagnosis not present

## 2020-10-10 DIAGNOSIS — M25441 Effusion, right hand: Secondary | ICD-10-CM | POA: Diagnosis not present

## 2020-10-10 DIAGNOSIS — M25532 Pain in left wrist: Secondary | ICD-10-CM | POA: Diagnosis not present

## 2020-10-10 DIAGNOSIS — R531 Weakness: Secondary | ICD-10-CM | POA: Diagnosis not present

## 2020-10-10 DIAGNOSIS — Z4789 Encounter for other orthopedic aftercare: Secondary | ICD-10-CM | POA: Diagnosis not present

## 2020-10-11 DIAGNOSIS — G5601 Carpal tunnel syndrome, right upper limb: Secondary | ICD-10-CM | POA: Insufficient documentation

## 2020-10-12 DIAGNOSIS — G5603 Carpal tunnel syndrome, bilateral upper limbs: Secondary | ICD-10-CM | POA: Diagnosis not present

## 2020-10-12 DIAGNOSIS — G5602 Carpal tunnel syndrome, left upper limb: Secondary | ICD-10-CM | POA: Insufficient documentation

## 2020-10-15 DIAGNOSIS — M25531 Pain in right wrist: Secondary | ICD-10-CM | POA: Diagnosis not present

## 2020-10-15 DIAGNOSIS — M25511 Pain in right shoulder: Secondary | ICD-10-CM | POA: Diagnosis not present

## 2020-10-15 DIAGNOSIS — M799 Soft tissue disorder, unspecified: Secondary | ICD-10-CM | POA: Diagnosis not present

## 2020-10-15 DIAGNOSIS — R531 Weakness: Secondary | ICD-10-CM | POA: Diagnosis not present

## 2020-10-15 DIAGNOSIS — M79641 Pain in right hand: Secondary | ICD-10-CM | POA: Diagnosis not present

## 2020-10-15 DIAGNOSIS — E1142 Type 2 diabetes mellitus with diabetic polyneuropathy: Secondary | ICD-10-CM | POA: Diagnosis not present

## 2020-10-15 DIAGNOSIS — M25641 Stiffness of right hand, not elsewhere classified: Secondary | ICD-10-CM | POA: Diagnosis not present

## 2020-10-15 DIAGNOSIS — M79642 Pain in left hand: Secondary | ICD-10-CM | POA: Diagnosis not present

## 2020-10-15 DIAGNOSIS — Z4789 Encounter for other orthopedic aftercare: Secondary | ICD-10-CM | POA: Diagnosis not present

## 2020-10-15 DIAGNOSIS — M25441 Effusion, right hand: Secondary | ICD-10-CM | POA: Diagnosis not present

## 2020-10-15 DIAGNOSIS — M25532 Pain in left wrist: Secondary | ICD-10-CM | POA: Diagnosis not present

## 2020-10-15 DIAGNOSIS — M6281 Muscle weakness (generalized): Secondary | ICD-10-CM | POA: Diagnosis not present

## 2020-10-15 DIAGNOSIS — R202 Paresthesia of skin: Secondary | ICD-10-CM | POA: Diagnosis not present

## 2020-10-15 DIAGNOSIS — M25611 Stiffness of right shoulder, not elsewhere classified: Secondary | ICD-10-CM | POA: Diagnosis not present

## 2020-10-16 ENCOUNTER — Other Ambulatory Visit: Payer: Self-pay | Admitting: Cardiovascular Disease

## 2020-10-17 DIAGNOSIS — R202 Paresthesia of skin: Secondary | ICD-10-CM | POA: Diagnosis not present

## 2020-10-17 DIAGNOSIS — M79641 Pain in right hand: Secondary | ICD-10-CM | POA: Diagnosis not present

## 2020-10-17 DIAGNOSIS — M25531 Pain in right wrist: Secondary | ICD-10-CM | POA: Diagnosis not present

## 2020-10-17 DIAGNOSIS — M25511 Pain in right shoulder: Secondary | ICD-10-CM | POA: Diagnosis not present

## 2020-10-17 DIAGNOSIS — M6281 Muscle weakness (generalized): Secondary | ICD-10-CM | POA: Diagnosis not present

## 2020-10-17 DIAGNOSIS — E1142 Type 2 diabetes mellitus with diabetic polyneuropathy: Secondary | ICD-10-CM | POA: Diagnosis not present

## 2020-10-17 DIAGNOSIS — M799 Soft tissue disorder, unspecified: Secondary | ICD-10-CM | POA: Diagnosis not present

## 2020-10-17 DIAGNOSIS — M79642 Pain in left hand: Secondary | ICD-10-CM | POA: Diagnosis not present

## 2020-10-17 DIAGNOSIS — M25441 Effusion, right hand: Secondary | ICD-10-CM | POA: Diagnosis not present

## 2020-10-17 DIAGNOSIS — Z4789 Encounter for other orthopedic aftercare: Secondary | ICD-10-CM | POA: Diagnosis not present

## 2020-10-17 DIAGNOSIS — M25641 Stiffness of right hand, not elsewhere classified: Secondary | ICD-10-CM | POA: Diagnosis not present

## 2020-10-17 DIAGNOSIS — M25611 Stiffness of right shoulder, not elsewhere classified: Secondary | ICD-10-CM | POA: Diagnosis not present

## 2020-10-17 DIAGNOSIS — R531 Weakness: Secondary | ICD-10-CM | POA: Diagnosis not present

## 2020-10-17 DIAGNOSIS — M25532 Pain in left wrist: Secondary | ICD-10-CM | POA: Diagnosis not present

## 2020-10-22 DIAGNOSIS — R202 Paresthesia of skin: Secondary | ICD-10-CM | POA: Diagnosis not present

## 2020-10-22 DIAGNOSIS — M25511 Pain in right shoulder: Secondary | ICD-10-CM | POA: Diagnosis not present

## 2020-10-22 DIAGNOSIS — I2510 Atherosclerotic heart disease of native coronary artery without angina pectoris: Secondary | ICD-10-CM | POA: Diagnosis not present

## 2020-10-22 DIAGNOSIS — M6281 Muscle weakness (generalized): Secondary | ICD-10-CM | POA: Diagnosis not present

## 2020-10-22 DIAGNOSIS — G4733 Obstructive sleep apnea (adult) (pediatric): Secondary | ICD-10-CM | POA: Diagnosis not present

## 2020-10-22 DIAGNOSIS — M25611 Stiffness of right shoulder, not elsewhere classified: Secondary | ICD-10-CM | POA: Diagnosis not present

## 2020-10-22 DIAGNOSIS — Z6837 Body mass index (BMI) 37.0-37.9, adult: Secondary | ICD-10-CM | POA: Diagnosis not present

## 2020-10-22 DIAGNOSIS — M25641 Stiffness of right hand, not elsewhere classified: Secondary | ICD-10-CM | POA: Diagnosis not present

## 2020-10-22 DIAGNOSIS — E1142 Type 2 diabetes mellitus with diabetic polyneuropathy: Secondary | ICD-10-CM | POA: Diagnosis not present

## 2020-10-22 DIAGNOSIS — Z4789 Encounter for other orthopedic aftercare: Secondary | ICD-10-CM | POA: Diagnosis not present

## 2020-10-22 DIAGNOSIS — R531 Weakness: Secondary | ICD-10-CM | POA: Diagnosis not present

## 2020-10-22 DIAGNOSIS — M79641 Pain in right hand: Secondary | ICD-10-CM | POA: Diagnosis not present

## 2020-10-22 DIAGNOSIS — M25441 Effusion, right hand: Secondary | ICD-10-CM | POA: Diagnosis not present

## 2020-10-22 DIAGNOSIS — I10 Essential (primary) hypertension: Secondary | ICD-10-CM | POA: Diagnosis not present

## 2020-10-22 DIAGNOSIS — E1129 Type 2 diabetes mellitus with other diabetic kidney complication: Secondary | ICD-10-CM | POA: Diagnosis not present

## 2020-10-22 DIAGNOSIS — M1990 Unspecified osteoarthritis, unspecified site: Secondary | ICD-10-CM | POA: Diagnosis not present

## 2020-10-22 DIAGNOSIS — E7849 Other hyperlipidemia: Secondary | ICD-10-CM | POA: Diagnosis not present

## 2020-10-22 DIAGNOSIS — M799 Soft tissue disorder, unspecified: Secondary | ICD-10-CM | POA: Diagnosis not present

## 2020-10-22 DIAGNOSIS — E782 Mixed hyperlipidemia: Secondary | ICD-10-CM | POA: Diagnosis not present

## 2020-10-22 DIAGNOSIS — M25532 Pain in left wrist: Secondary | ICD-10-CM | POA: Diagnosis not present

## 2020-10-22 DIAGNOSIS — M79642 Pain in left hand: Secondary | ICD-10-CM | POA: Diagnosis not present

## 2020-10-22 DIAGNOSIS — M25531 Pain in right wrist: Secondary | ICD-10-CM | POA: Diagnosis not present

## 2020-10-24 DIAGNOSIS — E1142 Type 2 diabetes mellitus with diabetic polyneuropathy: Secondary | ICD-10-CM | POA: Diagnosis not present

## 2020-10-24 DIAGNOSIS — M79642 Pain in left hand: Secondary | ICD-10-CM | POA: Diagnosis not present

## 2020-10-24 DIAGNOSIS — R202 Paresthesia of skin: Secondary | ICD-10-CM | POA: Diagnosis not present

## 2020-10-24 DIAGNOSIS — M25611 Stiffness of right shoulder, not elsewhere classified: Secondary | ICD-10-CM | POA: Diagnosis not present

## 2020-10-24 DIAGNOSIS — M25532 Pain in left wrist: Secondary | ICD-10-CM | POA: Diagnosis not present

## 2020-10-24 DIAGNOSIS — M25441 Effusion, right hand: Secondary | ICD-10-CM | POA: Diagnosis not present

## 2020-10-24 DIAGNOSIS — M799 Soft tissue disorder, unspecified: Secondary | ICD-10-CM | POA: Diagnosis not present

## 2020-10-24 DIAGNOSIS — M25531 Pain in right wrist: Secondary | ICD-10-CM | POA: Diagnosis not present

## 2020-10-24 DIAGNOSIS — M25641 Stiffness of right hand, not elsewhere classified: Secondary | ICD-10-CM | POA: Diagnosis not present

## 2020-10-24 DIAGNOSIS — M6281 Muscle weakness (generalized): Secondary | ICD-10-CM | POA: Diagnosis not present

## 2020-10-24 DIAGNOSIS — R531 Weakness: Secondary | ICD-10-CM | POA: Diagnosis not present

## 2020-10-24 DIAGNOSIS — Z4789 Encounter for other orthopedic aftercare: Secondary | ICD-10-CM | POA: Diagnosis not present

## 2020-10-24 DIAGNOSIS — M79641 Pain in right hand: Secondary | ICD-10-CM | POA: Diagnosis not present

## 2020-10-24 DIAGNOSIS — M25511 Pain in right shoulder: Secondary | ICD-10-CM | POA: Diagnosis not present

## 2020-10-29 DIAGNOSIS — M25511 Pain in right shoulder: Secondary | ICD-10-CM | POA: Diagnosis not present

## 2020-10-29 DIAGNOSIS — M6281 Muscle weakness (generalized): Secondary | ICD-10-CM | POA: Diagnosis not present

## 2020-10-29 DIAGNOSIS — M25611 Stiffness of right shoulder, not elsewhere classified: Secondary | ICD-10-CM | POA: Diagnosis not present

## 2020-10-29 DIAGNOSIS — Z4789 Encounter for other orthopedic aftercare: Secondary | ICD-10-CM | POA: Diagnosis not present

## 2020-10-29 DIAGNOSIS — M799 Soft tissue disorder, unspecified: Secondary | ICD-10-CM | POA: Diagnosis not present

## 2020-10-31 DIAGNOSIS — M799 Soft tissue disorder, unspecified: Secondary | ICD-10-CM | POA: Diagnosis not present

## 2020-10-31 DIAGNOSIS — M25511 Pain in right shoulder: Secondary | ICD-10-CM | POA: Diagnosis not present

## 2020-10-31 DIAGNOSIS — M6281 Muscle weakness (generalized): Secondary | ICD-10-CM | POA: Diagnosis not present

## 2020-10-31 DIAGNOSIS — M25611 Stiffness of right shoulder, not elsewhere classified: Secondary | ICD-10-CM | POA: Diagnosis not present

## 2020-10-31 DIAGNOSIS — Z4789 Encounter for other orthopedic aftercare: Secondary | ICD-10-CM | POA: Diagnosis not present

## 2020-11-06 DIAGNOSIS — M6281 Muscle weakness (generalized): Secondary | ICD-10-CM | POA: Diagnosis not present

## 2020-11-06 DIAGNOSIS — M25511 Pain in right shoulder: Secondary | ICD-10-CM | POA: Diagnosis not present

## 2020-11-06 DIAGNOSIS — M25611 Stiffness of right shoulder, not elsewhere classified: Secondary | ICD-10-CM | POA: Diagnosis not present

## 2020-11-06 DIAGNOSIS — M799 Soft tissue disorder, unspecified: Secondary | ICD-10-CM | POA: Diagnosis not present

## 2020-11-06 DIAGNOSIS — Z4789 Encounter for other orthopedic aftercare: Secondary | ICD-10-CM | POA: Diagnosis not present

## 2020-11-08 DIAGNOSIS — Z4789 Encounter for other orthopedic aftercare: Secondary | ICD-10-CM | POA: Diagnosis not present

## 2020-11-08 DIAGNOSIS — M799 Soft tissue disorder, unspecified: Secondary | ICD-10-CM | POA: Diagnosis not present

## 2020-11-08 DIAGNOSIS — M25511 Pain in right shoulder: Secondary | ICD-10-CM | POA: Diagnosis not present

## 2020-11-08 DIAGNOSIS — M25611 Stiffness of right shoulder, not elsewhere classified: Secondary | ICD-10-CM | POA: Diagnosis not present

## 2020-11-08 DIAGNOSIS — M6281 Muscle weakness (generalized): Secondary | ICD-10-CM | POA: Diagnosis not present

## 2020-11-13 DIAGNOSIS — M799 Soft tissue disorder, unspecified: Secondary | ICD-10-CM | POA: Diagnosis not present

## 2020-11-13 DIAGNOSIS — M25611 Stiffness of right shoulder, not elsewhere classified: Secondary | ICD-10-CM | POA: Diagnosis not present

## 2020-11-13 DIAGNOSIS — M6281 Muscle weakness (generalized): Secondary | ICD-10-CM | POA: Diagnosis not present

## 2020-11-13 DIAGNOSIS — M25511 Pain in right shoulder: Secondary | ICD-10-CM | POA: Diagnosis not present

## 2020-11-13 DIAGNOSIS — Z4789 Encounter for other orthopedic aftercare: Secondary | ICD-10-CM | POA: Diagnosis not present

## 2020-11-15 DIAGNOSIS — M25511 Pain in right shoulder: Secondary | ICD-10-CM | POA: Diagnosis not present

## 2020-11-15 DIAGNOSIS — G5603 Carpal tunnel syndrome, bilateral upper limbs: Secondary | ICD-10-CM | POA: Diagnosis not present

## 2020-11-15 DIAGNOSIS — M799 Soft tissue disorder, unspecified: Secondary | ICD-10-CM | POA: Diagnosis not present

## 2020-11-15 DIAGNOSIS — M6281 Muscle weakness (generalized): Secondary | ICD-10-CM | POA: Diagnosis not present

## 2020-11-15 DIAGNOSIS — M25611 Stiffness of right shoulder, not elsewhere classified: Secondary | ICD-10-CM | POA: Diagnosis not present

## 2020-11-15 DIAGNOSIS — Z4789 Encounter for other orthopedic aftercare: Secondary | ICD-10-CM | POA: Diagnosis not present

## 2020-11-20 DIAGNOSIS — M25611 Stiffness of right shoulder, not elsewhere classified: Secondary | ICD-10-CM | POA: Diagnosis not present

## 2020-11-20 DIAGNOSIS — M25511 Pain in right shoulder: Secondary | ICD-10-CM | POA: Diagnosis not present

## 2020-11-20 DIAGNOSIS — Z4789 Encounter for other orthopedic aftercare: Secondary | ICD-10-CM | POA: Diagnosis not present

## 2020-11-20 DIAGNOSIS — M6281 Muscle weakness (generalized): Secondary | ICD-10-CM | POA: Diagnosis not present

## 2020-11-20 DIAGNOSIS — M799 Soft tissue disorder, unspecified: Secondary | ICD-10-CM | POA: Diagnosis not present

## 2020-11-22 DIAGNOSIS — M6281 Muscle weakness (generalized): Secondary | ICD-10-CM | POA: Diagnosis not present

## 2020-11-22 DIAGNOSIS — M25511 Pain in right shoulder: Secondary | ICD-10-CM | POA: Diagnosis not present

## 2020-11-22 DIAGNOSIS — M25611 Stiffness of right shoulder, not elsewhere classified: Secondary | ICD-10-CM | POA: Diagnosis not present

## 2020-11-22 DIAGNOSIS — M799 Soft tissue disorder, unspecified: Secondary | ICD-10-CM | POA: Diagnosis not present

## 2020-11-22 DIAGNOSIS — Z4789 Encounter for other orthopedic aftercare: Secondary | ICD-10-CM | POA: Diagnosis not present

## 2020-11-23 DIAGNOSIS — M6281 Muscle weakness (generalized): Secondary | ICD-10-CM | POA: Diagnosis not present

## 2020-11-23 DIAGNOSIS — Z4789 Encounter for other orthopedic aftercare: Secondary | ICD-10-CM | POA: Diagnosis not present

## 2020-11-23 DIAGNOSIS — M25611 Stiffness of right shoulder, not elsewhere classified: Secondary | ICD-10-CM | POA: Diagnosis not present

## 2020-11-23 DIAGNOSIS — M799 Soft tissue disorder, unspecified: Secondary | ICD-10-CM | POA: Diagnosis not present

## 2020-11-23 DIAGNOSIS — M25511 Pain in right shoulder: Secondary | ICD-10-CM | POA: Diagnosis not present

## 2020-11-27 ENCOUNTER — Ambulatory Visit: Payer: Medicare PPO | Admitting: Cardiovascular Disease

## 2020-11-29 DIAGNOSIS — M25611 Stiffness of right shoulder, not elsewhere classified: Secondary | ICD-10-CM | POA: Diagnosis not present

## 2020-11-29 DIAGNOSIS — Z4789 Encounter for other orthopedic aftercare: Secondary | ICD-10-CM | POA: Diagnosis not present

## 2020-11-29 DIAGNOSIS — M25511 Pain in right shoulder: Secondary | ICD-10-CM | POA: Diagnosis not present

## 2020-11-29 DIAGNOSIS — M6281 Muscle weakness (generalized): Secondary | ICD-10-CM | POA: Diagnosis not present

## 2020-11-29 DIAGNOSIS — M799 Soft tissue disorder, unspecified: Secondary | ICD-10-CM | POA: Diagnosis not present

## 2020-11-30 DIAGNOSIS — M6281 Muscle weakness (generalized): Secondary | ICD-10-CM | POA: Diagnosis not present

## 2020-11-30 DIAGNOSIS — M25511 Pain in right shoulder: Secondary | ICD-10-CM | POA: Diagnosis not present

## 2020-11-30 DIAGNOSIS — M25611 Stiffness of right shoulder, not elsewhere classified: Secondary | ICD-10-CM | POA: Diagnosis not present

## 2020-11-30 DIAGNOSIS — M799 Soft tissue disorder, unspecified: Secondary | ICD-10-CM | POA: Diagnosis not present

## 2020-11-30 DIAGNOSIS — Z4789 Encounter for other orthopedic aftercare: Secondary | ICD-10-CM | POA: Diagnosis not present

## 2020-12-03 DIAGNOSIS — M799 Soft tissue disorder, unspecified: Secondary | ICD-10-CM | POA: Diagnosis not present

## 2020-12-03 DIAGNOSIS — M25511 Pain in right shoulder: Secondary | ICD-10-CM | POA: Diagnosis not present

## 2020-12-03 DIAGNOSIS — M25611 Stiffness of right shoulder, not elsewhere classified: Secondary | ICD-10-CM | POA: Diagnosis not present

## 2020-12-03 DIAGNOSIS — Z4789 Encounter for other orthopedic aftercare: Secondary | ICD-10-CM | POA: Diagnosis not present

## 2020-12-03 DIAGNOSIS — M6281 Muscle weakness (generalized): Secondary | ICD-10-CM | POA: Diagnosis not present

## 2020-12-04 DIAGNOSIS — Z4789 Encounter for other orthopedic aftercare: Secondary | ICD-10-CM | POA: Diagnosis not present

## 2020-12-04 DIAGNOSIS — M6281 Muscle weakness (generalized): Secondary | ICD-10-CM | POA: Diagnosis not present

## 2020-12-04 DIAGNOSIS — M25611 Stiffness of right shoulder, not elsewhere classified: Secondary | ICD-10-CM | POA: Diagnosis not present

## 2020-12-04 DIAGNOSIS — M25511 Pain in right shoulder: Secondary | ICD-10-CM | POA: Diagnosis not present

## 2020-12-04 DIAGNOSIS — M799 Soft tissue disorder, unspecified: Secondary | ICD-10-CM | POA: Diagnosis not present

## 2020-12-06 DIAGNOSIS — Z4789 Encounter for other orthopedic aftercare: Secondary | ICD-10-CM | POA: Diagnosis not present

## 2020-12-06 DIAGNOSIS — M25611 Stiffness of right shoulder, not elsewhere classified: Secondary | ICD-10-CM | POA: Diagnosis not present

## 2020-12-06 DIAGNOSIS — M25511 Pain in right shoulder: Secondary | ICD-10-CM | POA: Diagnosis not present

## 2020-12-06 DIAGNOSIS — M799 Soft tissue disorder, unspecified: Secondary | ICD-10-CM | POA: Diagnosis not present

## 2020-12-06 DIAGNOSIS — M6281 Muscle weakness (generalized): Secondary | ICD-10-CM | POA: Diagnosis not present

## 2020-12-10 DIAGNOSIS — Z4789 Encounter for other orthopedic aftercare: Secondary | ICD-10-CM | POA: Diagnosis not present

## 2020-12-10 DIAGNOSIS — M6281 Muscle weakness (generalized): Secondary | ICD-10-CM | POA: Diagnosis not present

## 2020-12-10 DIAGNOSIS — M25611 Stiffness of right shoulder, not elsewhere classified: Secondary | ICD-10-CM | POA: Diagnosis not present

## 2020-12-10 DIAGNOSIS — M799 Soft tissue disorder, unspecified: Secondary | ICD-10-CM | POA: Diagnosis not present

## 2020-12-10 DIAGNOSIS — M25511 Pain in right shoulder: Secondary | ICD-10-CM | POA: Diagnosis not present

## 2020-12-13 DIAGNOSIS — Z4789 Encounter for other orthopedic aftercare: Secondary | ICD-10-CM | POA: Diagnosis not present

## 2020-12-13 DIAGNOSIS — M6281 Muscle weakness (generalized): Secondary | ICD-10-CM | POA: Diagnosis not present

## 2020-12-13 DIAGNOSIS — M25611 Stiffness of right shoulder, not elsewhere classified: Secondary | ICD-10-CM | POA: Diagnosis not present

## 2020-12-13 DIAGNOSIS — M25511 Pain in right shoulder: Secondary | ICD-10-CM | POA: Diagnosis not present

## 2020-12-13 DIAGNOSIS — M799 Soft tissue disorder, unspecified: Secondary | ICD-10-CM | POA: Diagnosis not present

## 2020-12-14 DIAGNOSIS — M6281 Muscle weakness (generalized): Secondary | ICD-10-CM | POA: Diagnosis not present

## 2020-12-14 DIAGNOSIS — M25611 Stiffness of right shoulder, not elsewhere classified: Secondary | ICD-10-CM | POA: Diagnosis not present

## 2020-12-14 DIAGNOSIS — M25511 Pain in right shoulder: Secondary | ICD-10-CM | POA: Diagnosis not present

## 2020-12-14 DIAGNOSIS — M799 Soft tissue disorder, unspecified: Secondary | ICD-10-CM | POA: Diagnosis not present

## 2020-12-14 DIAGNOSIS — Z4789 Encounter for other orthopedic aftercare: Secondary | ICD-10-CM | POA: Diagnosis not present

## 2020-12-18 DIAGNOSIS — M799 Soft tissue disorder, unspecified: Secondary | ICD-10-CM | POA: Diagnosis not present

## 2020-12-18 DIAGNOSIS — M25511 Pain in right shoulder: Secondary | ICD-10-CM | POA: Diagnosis not present

## 2020-12-18 DIAGNOSIS — M6281 Muscle weakness (generalized): Secondary | ICD-10-CM | POA: Diagnosis not present

## 2020-12-18 DIAGNOSIS — M25611 Stiffness of right shoulder, not elsewhere classified: Secondary | ICD-10-CM | POA: Diagnosis not present

## 2020-12-18 DIAGNOSIS — Z4789 Encounter for other orthopedic aftercare: Secondary | ICD-10-CM | POA: Diagnosis not present

## 2020-12-20 DIAGNOSIS — M6281 Muscle weakness (generalized): Secondary | ICD-10-CM | POA: Diagnosis not present

## 2020-12-20 DIAGNOSIS — M25611 Stiffness of right shoulder, not elsewhere classified: Secondary | ICD-10-CM | POA: Diagnosis not present

## 2020-12-20 DIAGNOSIS — M799 Soft tissue disorder, unspecified: Secondary | ICD-10-CM | POA: Diagnosis not present

## 2020-12-20 DIAGNOSIS — M25511 Pain in right shoulder: Secondary | ICD-10-CM | POA: Diagnosis not present

## 2020-12-20 DIAGNOSIS — Z4789 Encounter for other orthopedic aftercare: Secondary | ICD-10-CM | POA: Diagnosis not present

## 2020-12-25 DIAGNOSIS — M25511 Pain in right shoulder: Secondary | ICD-10-CM | POA: Diagnosis not present

## 2020-12-25 DIAGNOSIS — Z4789 Encounter for other orthopedic aftercare: Secondary | ICD-10-CM | POA: Diagnosis not present

## 2020-12-25 DIAGNOSIS — M25611 Stiffness of right shoulder, not elsewhere classified: Secondary | ICD-10-CM | POA: Diagnosis not present

## 2020-12-25 DIAGNOSIS — M799 Soft tissue disorder, unspecified: Secondary | ICD-10-CM | POA: Diagnosis not present

## 2020-12-25 DIAGNOSIS — M6281 Muscle weakness (generalized): Secondary | ICD-10-CM | POA: Diagnosis not present

## 2020-12-27 DIAGNOSIS — M799 Soft tissue disorder, unspecified: Secondary | ICD-10-CM | POA: Diagnosis not present

## 2020-12-27 DIAGNOSIS — M25511 Pain in right shoulder: Secondary | ICD-10-CM | POA: Diagnosis not present

## 2020-12-27 DIAGNOSIS — M25611 Stiffness of right shoulder, not elsewhere classified: Secondary | ICD-10-CM | POA: Diagnosis not present

## 2020-12-27 DIAGNOSIS — Z4789 Encounter for other orthopedic aftercare: Secondary | ICD-10-CM | POA: Diagnosis not present

## 2020-12-27 DIAGNOSIS — M6281 Muscle weakness (generalized): Secondary | ICD-10-CM | POA: Diagnosis not present

## 2021-01-01 DIAGNOSIS — M25511 Pain in right shoulder: Secondary | ICD-10-CM | POA: Diagnosis not present

## 2021-01-01 DIAGNOSIS — M25611 Stiffness of right shoulder, not elsewhere classified: Secondary | ICD-10-CM | POA: Diagnosis not present

## 2021-01-01 DIAGNOSIS — M799 Soft tissue disorder, unspecified: Secondary | ICD-10-CM | POA: Diagnosis not present

## 2021-01-01 DIAGNOSIS — M6281 Muscle weakness (generalized): Secondary | ICD-10-CM | POA: Diagnosis not present

## 2021-01-01 DIAGNOSIS — Z4789 Encounter for other orthopedic aftercare: Secondary | ICD-10-CM | POA: Diagnosis not present

## 2021-01-04 DIAGNOSIS — Z4789 Encounter for other orthopedic aftercare: Secondary | ICD-10-CM | POA: Diagnosis not present

## 2021-01-04 DIAGNOSIS — M25611 Stiffness of right shoulder, not elsewhere classified: Secondary | ICD-10-CM | POA: Diagnosis not present

## 2021-01-04 DIAGNOSIS — M6281 Muscle weakness (generalized): Secondary | ICD-10-CM | POA: Diagnosis not present

## 2021-01-04 DIAGNOSIS — M799 Soft tissue disorder, unspecified: Secondary | ICD-10-CM | POA: Diagnosis not present

## 2021-01-04 DIAGNOSIS — M25511 Pain in right shoulder: Secondary | ICD-10-CM | POA: Diagnosis not present

## 2021-01-09 DIAGNOSIS — Z4789 Encounter for other orthopedic aftercare: Secondary | ICD-10-CM | POA: Diagnosis not present

## 2021-01-09 DIAGNOSIS — M6281 Muscle weakness (generalized): Secondary | ICD-10-CM | POA: Diagnosis not present

## 2021-01-09 DIAGNOSIS — M25511 Pain in right shoulder: Secondary | ICD-10-CM | POA: Diagnosis not present

## 2021-01-09 DIAGNOSIS — M799 Soft tissue disorder, unspecified: Secondary | ICD-10-CM | POA: Diagnosis not present

## 2021-01-09 DIAGNOSIS — M25611 Stiffness of right shoulder, not elsewhere classified: Secondary | ICD-10-CM | POA: Diagnosis not present

## 2021-01-15 DIAGNOSIS — M799 Soft tissue disorder, unspecified: Secondary | ICD-10-CM | POA: Diagnosis not present

## 2021-01-15 DIAGNOSIS — M25511 Pain in right shoulder: Secondary | ICD-10-CM | POA: Diagnosis not present

## 2021-01-15 DIAGNOSIS — Z4789 Encounter for other orthopedic aftercare: Secondary | ICD-10-CM | POA: Diagnosis not present

## 2021-01-15 DIAGNOSIS — M25611 Stiffness of right shoulder, not elsewhere classified: Secondary | ICD-10-CM | POA: Diagnosis not present

## 2021-01-15 DIAGNOSIS — M6281 Muscle weakness (generalized): Secondary | ICD-10-CM | POA: Diagnosis not present

## 2021-01-17 DIAGNOSIS — M799 Soft tissue disorder, unspecified: Secondary | ICD-10-CM | POA: Diagnosis not present

## 2021-01-17 DIAGNOSIS — M6281 Muscle weakness (generalized): Secondary | ICD-10-CM | POA: Diagnosis not present

## 2021-01-17 DIAGNOSIS — M25611 Stiffness of right shoulder, not elsewhere classified: Secondary | ICD-10-CM | POA: Diagnosis not present

## 2021-01-17 DIAGNOSIS — M25511 Pain in right shoulder: Secondary | ICD-10-CM | POA: Diagnosis not present

## 2021-01-17 DIAGNOSIS — Z4789 Encounter for other orthopedic aftercare: Secondary | ICD-10-CM | POA: Diagnosis not present

## 2021-02-04 DIAGNOSIS — G4733 Obstructive sleep apnea (adult) (pediatric): Secondary | ICD-10-CM | POA: Diagnosis not present

## 2021-02-04 DIAGNOSIS — I249 Acute ischemic heart disease, unspecified: Secondary | ICD-10-CM | POA: Diagnosis not present

## 2021-02-04 DIAGNOSIS — M75101 Unspecified rotator cuff tear or rupture of right shoulder, not specified as traumatic: Secondary | ICD-10-CM | POA: Diagnosis not present

## 2021-02-04 DIAGNOSIS — M1991 Primary osteoarthritis, unspecified site: Secondary | ICD-10-CM | POA: Diagnosis not present

## 2021-02-04 DIAGNOSIS — I2510 Atherosclerotic heart disease of native coronary artery without angina pectoris: Secondary | ICD-10-CM | POA: Diagnosis not present

## 2021-02-04 DIAGNOSIS — I10 Essential (primary) hypertension: Secondary | ICD-10-CM | POA: Diagnosis not present

## 2021-02-04 DIAGNOSIS — E1129 Type 2 diabetes mellitus with other diabetic kidney complication: Secondary | ICD-10-CM | POA: Diagnosis not present

## 2021-02-04 DIAGNOSIS — E782 Mixed hyperlipidemia: Secondary | ICD-10-CM | POA: Diagnosis not present

## 2021-02-05 DIAGNOSIS — Z4789 Encounter for other orthopedic aftercare: Secondary | ICD-10-CM | POA: Diagnosis not present

## 2021-02-05 DIAGNOSIS — M25611 Stiffness of right shoulder, not elsewhere classified: Secondary | ICD-10-CM | POA: Diagnosis not present

## 2021-02-05 DIAGNOSIS — M799 Soft tissue disorder, unspecified: Secondary | ICD-10-CM | POA: Diagnosis not present

## 2021-02-05 DIAGNOSIS — M25511 Pain in right shoulder: Secondary | ICD-10-CM | POA: Diagnosis not present

## 2021-02-05 DIAGNOSIS — M6281 Muscle weakness (generalized): Secondary | ICD-10-CM | POA: Diagnosis not present

## 2021-02-07 DIAGNOSIS — M25511 Pain in right shoulder: Secondary | ICD-10-CM | POA: Diagnosis not present

## 2021-02-07 DIAGNOSIS — Z4789 Encounter for other orthopedic aftercare: Secondary | ICD-10-CM | POA: Diagnosis not present

## 2021-02-07 DIAGNOSIS — M25611 Stiffness of right shoulder, not elsewhere classified: Secondary | ICD-10-CM | POA: Diagnosis not present

## 2021-02-07 DIAGNOSIS — M6281 Muscle weakness (generalized): Secondary | ICD-10-CM | POA: Diagnosis not present

## 2021-02-07 DIAGNOSIS — M799 Soft tissue disorder, unspecified: Secondary | ICD-10-CM | POA: Diagnosis not present

## 2021-02-13 DIAGNOSIS — Z4789 Encounter for other orthopedic aftercare: Secondary | ICD-10-CM | POA: Diagnosis not present

## 2021-02-14 DIAGNOSIS — R3914 Feeling of incomplete bladder emptying: Secondary | ICD-10-CM | POA: Diagnosis not present

## 2021-02-14 DIAGNOSIS — R972 Elevated prostate specific antigen [PSA]: Secondary | ICD-10-CM | POA: Diagnosis not present

## 2021-02-14 DIAGNOSIS — N401 Enlarged prostate with lower urinary tract symptoms: Secondary | ICD-10-CM | POA: Diagnosis not present

## 2021-03-08 ENCOUNTER — Other Ambulatory Visit: Payer: Self-pay

## 2021-03-08 ENCOUNTER — Ambulatory Visit: Payer: Medicare PPO | Admitting: Cardiovascular Disease

## 2021-03-08 ENCOUNTER — Encounter: Payer: Self-pay | Admitting: Cardiovascular Disease

## 2021-03-08 DIAGNOSIS — G4733 Obstructive sleep apnea (adult) (pediatric): Secondary | ICD-10-CM | POA: Diagnosis not present

## 2021-03-08 DIAGNOSIS — I2510 Atherosclerotic heart disease of native coronary artery without angina pectoris: Secondary | ICD-10-CM | POA: Diagnosis not present

## 2021-03-08 DIAGNOSIS — Z9989 Dependence on other enabling machines and devices: Secondary | ICD-10-CM | POA: Diagnosis not present

## 2021-03-08 DIAGNOSIS — E782 Mixed hyperlipidemia: Secondary | ICD-10-CM

## 2021-03-08 DIAGNOSIS — I10 Essential (primary) hypertension: Secondary | ICD-10-CM | POA: Diagnosis not present

## 2021-03-08 DIAGNOSIS — R600 Localized edema: Secondary | ICD-10-CM

## 2021-03-08 NOTE — Assessment & Plan Note (Signed)
History of hyperlipidemia on statin therapy with lipid profile performed 02/04/2021 revealing total cholesterol 144, LDL of 85 and HDL 41.

## 2021-03-08 NOTE — Assessment & Plan Note (Signed)
History of essential hypertension a blood pressure measured today at 120/60.  He is on metoprolol, valsartan and hydrochlorothiazide.

## 2021-03-08 NOTE — Assessment & Plan Note (Signed)
History of bilateral lower extremity edema presumably related to venous reflux disease.  He has had endovenous ablation of the left by Dr. Elisabeth Cara in the past.  The edema becomes worse throughout the day.  He does avoid salt.  He requests compression stockings.

## 2021-03-08 NOTE — Progress Notes (Signed)
03/08/2021 Kevin [name]   1945-11-08  [id number]  Primary Physician Sharilyn Sites, MD Primary Cardiologist: Lorretta Harp MD FACP, [place], [place], [place]  HPI:  Kevin [name] is a 75 y.o.  severely overweight divorced Caucasian male, father of 1 child, who I last saw in the office 08/08/2020. 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 He has a history of CAD, status post remote LAD and circumflex stenting in 2001 and 2004 respectively by Drs. [name] and [name]. He stopped smoking and drinking at that time as well. His other problems include hypertension, hyperlipidemia, and non-insulin-requiring diabetes. He also has obstructive sleep apnea, on CPAP. He has had lap-banding in the past with ultimate reaccumulation of his weight. He has had endovenous ablation by Dr. Elisabeth Cara of his left greater saphenous vein, which really did not afford significant clinical improvement. He had a functional study performed December 22, 2008, which was nonischemic, and another one June 24, 2011, because of chest pain, which was low risk. He has been asymptomatic.Dr. [name] recently checked his lipid profile last week as an outpatient. He was admitted to [facility] last month which has been shortness of breath. He underwent cardiac catheterization by Dr. Jeralene Peters Smith's revealing patent LAD and circumflex stents and a left dominant system with an occluded nondominant RCA. Medical therapy was recommended. He experimented with stopping his newly added diabetes medicines one of which apparently was contributing to his symptoms. He feels clinically improved after stopping this medication. Since I saw him in March 2016 he was admitted with unstable angina on 02/14/15 and underwent cardiac catheterization by Dr. [name] His LAD stent had diffuse in-stent restenosis with a 95% distal edge stenosis which was restented using a 3.5 x 28 mm long synergy drug-eluting stent. He did  have an ostial diagonal branch stenosis which was "jailedand a 75% first obtuse marginal branch stenosis as well as an occluded nondominant RCA.   He was started on Synthroid at 100 mcg and noticed tachypalpitations.  An event monitor showed occasional PACs, PVCs and short runs of SVT.  His dose was lowered to 50 mcg and his palpitations have resolved.  He had his right shoulder operated on by Dr. Hillery Aldo 08/23/2020 which he has recuperated from.  Since I saw him 6 months ago he did have an episode of tachypalpitations last night.  He also complains of lower extremity edema most likely related to venous reflux disease.  He denies chest pain or shortness of breath.  Current Meds  Medication Sig   Ascorbic Acid (VITAMIN C) 1000 MG tablet Take 1,000 mg by mouth daily.   aspirin 81 MG tablet Take 81 mg by mouth daily.   cetirizine (ZYRTEC) 10 MG tablet Take 10 mg by mouth daily.   clopidogrel (PLAVIX) 75 MG tablet TAKE ONE TABLET BY MOUTH ONCE DAILY.   Coenzyme Q10 (CO Q 10) 10 MG CAPS Take 1 capsule by mouth daily.   diphenhydrAMINE (BENADRYL) 25 MG tablet Take 25 mg by mouth every 6 (six) hours as needed.   EPINEPHrine (EPIPEN 2-PAK) 0.3 mg/0.3 mL IJ SOAJ injection Inject 0.3 mLs (0.3 mg total) into the muscle as needed for anaphylaxis.   fluticasone (FLONASE) 50 MCG/ACT nasal spray Place 2 sprays into both nostrils daily.   furosemide (LASIX) 40 MG tablet Take 40 mg by mouth daily.   levothyroxine (SYNTHROID) 50 MCG tablet levothyroxine 50 mcg tablet  metoprolol (TOPROL-XL) 100 MG 24 hr tablet Take 100 mg by mouth daily.   niacin (NIASPAN) 1000 MG CR tablet TAKE 1 TABLET BY MOUTH AT BEDTIME.   pravastatin (PRAVACHOL) 40 MG tablet Take 1 tablet (40 mg total) by mouth every evening.   RESVERATROL PO Take 1 capsule by mouth every morning.    sitaGLIPtin (JANUVIA) 100 MG tablet Take 100 mg by mouth daily.   tamsulosin (FLOMAX) 0.4 MG CAPS capsule Take 0.4 mg by mouth daily.    valsartan-hydrochlorothiazide (DIOVAN-HCT) 160-12.5 MG per tablet Take 1 tablet by mouth daily.     Allergies  Allergen Reactions   Altace [Ramipril] Shortness Of Breath   Avapro [Irbesartan] Shortness Of Breath   Prednisone Anaphylaxis, Swelling and Other (See Comments)    SWELLING OF THE LIPS   Shellfish Allergy Anaphylaxis    Shrimp, selfish   Tradjenta [Linagliptin] Shortness Of Breath   Cephalexin Hives and Swelling   Clindamycin Hcl Hives and Swelling   Codeine Nausea And Vomiting and Other (See Comments)    AFFECTS HEART   Contrast Media [Iodinated Diagnostic Agents]     Can be administered if Benadryl is administered prior to prevent reaction    Fenofibrate Other (See Comments)    "My skin started peeling off"   Glycotrol [Support-500] Other (See Comments)    unknown   Indomethacin Other (See Comments)    UNKNOWN REACTION   Minocycline Hcl Hives and Swelling   Morphine And Related Nausea And Vomiting    PROJECTILE VOMITING   Sulfa Drugs Cross Reactors Hives and Swelling   Tetracyclines & Related    Tree Extract Itching    Social History   Socioeconomic History   Marital status: Divorced    Spouse name: Not on file   Number of children: 1   Years of education: Not on file   Highest education level: Not on file  Occupational History   Occupation: Leisure centre manager: [employer]    Comment: [place]  Tobacco Use   Smoking status: Former    Packs/day: 3.00    Years: 30.00    Pack years: 90.00    Types: Cigarettes    Start date: 11/18/1964    Quit date: 10/04/1994    Years since quitting: 26.4   Smokeless tobacco: Never   Tobacco comments:    quit smoking 19 years ago  Vaping Use   Vaping Use: Never used  Substance and Sexual Activity   Alcohol use: Yes    Alcohol/week: 0.0 standard drinks    Comment: 3-4 times/year   Drug use: No   Sexual activity: Not Currently  Other Topics Concern   Not on file  Social History Narrative   Not on file    Social Determinants of Health   Financial Resource Strain: Not on file  Food Insecurity: Not on file  Transportation Needs: Not on file  Physical Activity: Not on file  Stress: Not on file  Social Connections: Not on file  Intimate Partner Violence: Not on file     Review of Systems: General: negative for chills, fever, night sweats or weight changes.  Cardiovascular: negative for chest pain, dyspnea on exertion, edema, orthopnea, palpitations, paroxysmal nocturnal dyspnea or shortness of breath Dermatological: negative for rash Respiratory: negative for cough or wheezing Urologic: negative for hematuria Abdominal: negative for nausea, vomiting, diarrhea, bright red blood per rectum, melena, or hematemesis Neurologic: negative for visual changes, syncope, or dizziness All other systems reviewed and  are otherwise negative except as noted above.    Blood pressure 120/60, pulse 72, height 5\' 7"  (1.702 m), weight 253 lb 3.2 oz (114.9 kg), SpO2 99 %.  General appearance: alert and no distress Neck: no adenopathy, no carotid bruit, no JVD, supple, symmetrical, trachea midline, and thyroid not enlarged, symmetric, no tenderness/mass/nodules Lungs: clear to auscultation bilaterally Heart: regular rate and rhythm, S1, S2 normal, no murmur, click, rub or gallop Extremities: extremities normal, atraumatic, no cyanosis or edema Pulses: 2+ and symmetric Skin: Skin color, texture, turgor normal. No rashes or lesions Neurologic: Grossly normal  EKG sinus rhythm at 72 without ST or T wave changes.  I personally reviewed this EKG.  ASSESSMENT AND PLAN:   OSA on CPAP History of obstructive sleep apnea unable to wear CPAP.  He is seeing Dr. Valetta Mole and exploring the inspire device.  Coronary artery disease, hx LAD and LCX stenting in 2001 & 2004, last cath 2009 with patent stents. History of coronary artery disease status post remote LAD and circumflex stenting in 2001 and 2004  respectively by Drs. Broie  and [employer].  He has had a number of cardiac catheterization since most recently by Dr. [name] 02/14/2015.  He had diffuse in-stent restenosis of his LAD stent with a 95% distal edge stenosis which was restented with a 3.5 mm x 28 mm long Synergy drug-eluting stent.  He had a diagonal branch ostium that was jailed and and a 75% first obtuse marginal branch stenosis as well as an occluded nondominant RCA.  He denies chest pain or shortness of breath.  Essential hypertension History of essential hypertension a blood pressure measured today at 120/60.  He is on metoprolol, valsartan and hydrochlorothiazide.  Hyperlipidemia History of hyperlipidemia on statin therapy with lipid profile performed 02/04/2021 revealing total cholesterol 144, LDL of 85 and HDL 41.  Edema, lower extremity, bil History of bilateral lower extremity edema presumably related to venous reflux disease.  He has had endovenous ablation of the left by Dr. Elisabeth Cara in the past.  The edema becomes worse throughout the day.  He does avoid salt.  He requests compression stockings.     Lorretta Harp MD FACP,FACC,FAHA, [facility] 03/08/2021 9:28 AM

## 2021-03-08 NOTE — Assessment & Plan Note (Signed)
History of coronary artery disease status post remote LAD and circumflex stenting in 2001 and 2004 respectively by Drs. Broie  and [employer].  He has had a number of cardiac catheterization since most recently by Dr. [name] 02/14/2015.  He had diffuse in-stent restenosis of his LAD stent with a 95% distal edge stenosis which was restented with a 3.5 mm x 28 mm long Synergy drug-eluting stent.  He had a diagonal branch ostium that was jailed and and a 75% first obtuse marginal branch stenosis as well as an occluded nondominant RCA.  He denies chest pain or shortness of breath.

## 2021-03-08 NOTE — Patient Instructions (Signed)

## 2021-03-08 NOTE — Assessment & Plan Note (Signed)
History of obstructive sleep apnea unable to wear CPAP.  He is seeing Dr. Valetta Mole and exploring the inspire device.

## 2021-03-21 DIAGNOSIS — E119 Type 2 diabetes mellitus without complications: Secondary | ICD-10-CM | POA: Diagnosis not present

## 2021-04-07 DIAGNOSIS — R070 Pain in throat: Secondary | ICD-10-CM | POA: Diagnosis not present

## 2021-04-07 DIAGNOSIS — J029 Acute pharyngitis, unspecified: Secondary | ICD-10-CM | POA: Diagnosis not present

## 2021-04-08 ENCOUNTER — Other Ambulatory Visit: Payer: Self-pay

## 2021-04-08 ENCOUNTER — Emergency Department (HOSPITAL_COMMUNITY)
Admission: EM | Admit: 2021-04-08 | Discharge: 2021-04-08 | Disposition: A | Discharge status: Home / Self Care | Payer: Medicare PPO | Attending: Emergency Medicine | Admitting: Emergency Medicine

## 2021-04-08 ENCOUNTER — Encounter (HOSPITAL_COMMUNITY): Payer: Self-pay

## 2021-04-08 DIAGNOSIS — J45909 Unspecified asthma, uncomplicated: Secondary | ICD-10-CM | POA: Insufficient documentation

## 2021-04-08 DIAGNOSIS — E119 Type 2 diabetes mellitus without complications: Secondary | ICD-10-CM | POA: Insufficient documentation

## 2021-04-08 DIAGNOSIS — I10 Essential (primary) hypertension: Secondary | ICD-10-CM | POA: Diagnosis not present

## 2021-04-08 DIAGNOSIS — Z87891 Personal history of nicotine dependence: Secondary | ICD-10-CM | POA: Insufficient documentation

## 2021-04-08 DIAGNOSIS — Z20822 Contact with and (suspected) exposure to covid-19: Secondary | ICD-10-CM | POA: Insufficient documentation

## 2021-04-08 DIAGNOSIS — I2510 Atherosclerotic heart disease of native coronary artery without angina pectoris: Secondary | ICD-10-CM | POA: Diagnosis not present

## 2021-04-08 DIAGNOSIS — R070 Pain in throat: Secondary | ICD-10-CM | POA: Diagnosis not present

## 2021-04-08 DIAGNOSIS — J029 Acute pharyngitis, unspecified: Secondary | ICD-10-CM | POA: Insufficient documentation

## 2021-04-08 LAB — RESP PANEL BY RT-PCR (FLU A&B, COVID) ARPGX2
Influenza A by PCR: NEGATIVE
Influenza B by PCR: NEGATIVE
SARS Coronavirus 2 by RT PCR: NEGATIVE

## 2021-04-08 LAB — GROUP A STREP BY PCR: Group A Strep by PCR: NOT DETECTED

## 2021-04-08 MED ORDER — PREDNISONE 50 MG PO TABS
60.0000 mg | ORAL_TABLET | Freq: Once | ORAL | Status: AC
  Administered 2021-04-08: 60 mg via ORAL
  Filled 2021-04-08: qty 1

## 2021-04-08 NOTE — ED Triage Notes (Signed)
Pt reports sore throat that started yesterday associated with painful swallowing and hoarse voice. No fever. Denies any other symptoms.

## 2021-04-08 NOTE — ED Notes (Signed)
EDP and RN reviewed the risks and benefits with the Pt regarding taking predniSONE. Pt in agreement to try PO medication predniSONE. Pt moved closer to nurse station for close observation. Pt aware to use call bell if allergic symptoms develop.

## 2021-04-08 NOTE — Discharge Instructions (Signed)
You were evaluated in the Emergency Department and after careful evaluation, we did not find any emergent condition requiring admission or further testing in the hospital.  Your exam/testing today was overall reassuring.  Suspect virus causing sore throat.  Recommend Tylenol or Motrin at home for discomfort.  Please return to the Emergency Department if you experience any worsening of your condition.  Thank you for allowing [place] to be a part of your care.

## 2021-04-08 NOTE — ED Provider Notes (Signed)
[facility] Emergency Department Provider Note MRN:  [id number]  Arrival date & time: 04/08/21     Chief Complaint   Sore Throat   History of Present Illness   Kevin [name] is a 75 y.o. year-old male with a history of hypertension, diabetes, CAD presenting to the ED with chief complaint of sore throat.  Sore throat for the past 1 or 2 days.  Worse with swallowing.  Moderate to severe.  No fever, no cough, some chest congestion.  Denies chest pain or shortness of breath, no abdominal pain, no other complaints.  Review of Systems  A complete 10 system review of systems was obtained and all systems are negative except as noted in the HPI and PMH.   Patient's Health History    Past Medical History:  Diagnosis Date   Angio-edema    Asthma    as a child. no problems now.    CAD (coronary artery disease)    Cellulitis 5/11   left leg   Diabetes mellitus    Diverticulosis    Diverticulosis    HTN (hypertension)    Hyperlipidemia    Melanoma ([place]) 10/11   left foot   Melanoma of foot ([place]) 06/02/2011   Microscopic colitis 10/09/10   Colonoscopy dr [name]   Obesity    OSA on CPAP 01/20/2014   Psoriasis    S/P colonoscopy 07/03/03    Dr [name]->hyperplastic rectal polyp   S/P endoscopy 10/09/10   lap band intact, duodenal erosions   Seasonal allergies    Sleep apnea    Urticaria     Past Surgical History:  Procedure Laterality Date   ABDOMINAL SURGERY     ADENOIDECTOMY     APPENDECTOMY     CARDIAC CATHETERIZATION N/A 02/14/2015   Procedure: Left Heart Cath and Coronary Angiography;  Surgeon: Jettie Booze, MD;  Location: [place] [facility];  Service: Cardiovascular;  Laterality: N/A;   CARDIAC CATHETERIZATION N/A 02/14/2015   Procedure: Coronary Stent Intervention;  Surgeon: Jettie Booze, MD;  Location: [place] [facility];  Service: Cardiovascular;  Laterality: N/A;   CORONARY ANGIOPLASTY  2002/2005/2008   CORONARY ANGIOPLASTY WITH STENT  PLACEMENT  06/11/2007   PTCA & stenting distal CX, PTCA & balloon angioplasty in-stent restenotic mid CX coronary artery stent   KNEE SURGERY     left   LEFT HEART CATHETERIZATION WITH CORONARY ANGIOGRAM N/A 07/15/2013   Procedure: LEFT HEART CATHETERIZATION WITH CORONARY ANGIOGRAM;  Surgeon: Sinclair Grooms, MD;  Location: [facility] CATH LAB;  Service: Cardiovascular;  Laterality: N/A;   NM MYOCAR PERF WALL MOTION  06/24/2011   small mild fixed anteroseptal & anteroapical defects.   PENILE PROSTHESIS IMPLANT     SHOULDER SURGERY     left   SINOSCOPY     TONSILLECTOMY     TYMPANOSTOMY TUBE PLACEMENT      Family History  Problem Relation Age of Onset   Coronary artery disease Mother    Diabetes Father    Liver cancer Brother 69   Celiac disease Other 39       brother's daughter   Allergic rhinitis Neg Hx    Angioedema Neg Hx    Asthma Neg Hx    Atopy Neg Hx    Eczema Neg Hx    Immunodeficiency Neg Hx    Urticaria Neg Hx     Social History   Socioeconomic History   Marital status: Divorced    Spouse name: Not  on file   Number of children: 1   Years of education: Not on file   Highest education level: Not on file  Occupational History   Occupation: Leisure centre manager: [employer]    Comment: [employer]  Tobacco Use   Smoking status: Former    Packs/day: 3.00    Years: 30.00    Pack years: 90.00    Types: Cigarettes    Start date: 11/18/1964    Quit date: 10/04/1994    Years since quitting: 26.5   Smokeless tobacco: Never   Tobacco comments:    quit smoking 19 years ago  Vaping Use   Vaping Use: Never used  Substance and Sexual Activity   Alcohol use: Yes    Alcohol/week: 0.0 standard drinks    Comment: 3-4 times/year   Drug use: No   Sexual activity: Not Currently  Other Topics Concern   Not on file  Social History Narrative   Not on file   Social Determinants of Health   Financial Resource Strain: Not on file  Food Insecurity: Not on file   Transportation Needs: Not on file  Physical Activity: Not on file  Stress: Not on file  Social Connections: Not on file  Intimate Partner Violence: Not on file     Physical Exam   Vitals:   04/08/21 0047  BP: (!) 173/89  Pulse: 67  Resp: 18  Temp: 97.9 F (36.6 C)  SpO2: 100%    CONSTITUTIONAL: Well-appearing, NAD NEURO:  Alert and oriented x 3, no focal deficits EYES:  eyes equal and reactive ENT/NECK:  no LAD, no JVD; mild erythema to the posterior oropharynx, no asymmetry CARDIO: Regular rate, well-perfused, normal S1 and S2 PULM:  CTAB no wheezing or rhonchi GI/GU:  normal bowel sounds, non-distended, non-tender MSK/SPINE:  No gross deformities, no edema SKIN:  no rash, atraumatic PSYCH:  Appropriate speech and behavior  *Additional and/or pertinent findings included in MDM below  Diagnostic and Interventional Summary    EKG Interpretation  Date/Time:    Ventricular Rate:    PR Interval:    QRS Duration:   QT Interval:    QTC Calculation:   R Axis:     Text Interpretation:         Labs Reviewed  GROUP A STREP BY PCR  RESP PANEL BY RT-PCR (FLU A&B, COVID) [id number]    No orders to display    Medications  predniSONE (DELTASONE) tablet 60 mg (60 mg Oral Given 04/08/21 0303)     Procedures  /  Critical Care Procedures  ED Course and Medical Decision Making  I have reviewed the triage vital signs, the nursing notes, and pertinent available records from the EMR.  Listed above are laboratory and imaging tests that I personally ordered, reviewed, and interpreted and then considered in my medical decision making (see below for details).  Patient is well-appearing with normal vital signs, has some mild erythema to the oropharynx, nothing to suggest significant infection or abscess.  Swabs are negative for COVID, negative for flu, negative for strep.  We discussed the pros and cons of symptomatic management with steroids and patient wanted to receive this  treatment.  We will keep an eye on his blood sugar.  Appropriate for discharge.       Barth Kirks. Sedonia Small, [place] [email or web address]  Final Clinical Impressions(s) / ED Diagnoses     ICD-10-CM   1. Sore throat  J02.9       ED Discharge Orders     None        Discharge Instructions Discussed with and Provided to Patient:    Discharge Instructions      You were evaluated in the Emergency Department and after careful evaluation, we did not find any emergent condition requiring admission or further testing in the hospital.  Your exam/testing today was overall reassuring.  Suspect virus causing sore throat.  Recommend Tylenol or Motrin at home for discomfort.  Please return to the Emergency Department if you experience any worsening of your condition.  Thank you for allowing [place] to be a part of your care.        Maudie Flakes, MD 04/08/21 [phone]

## 2021-04-17 DIAGNOSIS — Z08 Encounter for follow-up examination after completed treatment for malignant neoplasm: Secondary | ICD-10-CM | POA: Diagnosis not present

## 2021-04-17 DIAGNOSIS — Z85820 Personal history of malignant melanoma of skin: Secondary | ICD-10-CM | POA: Diagnosis not present

## 2021-04-17 DIAGNOSIS — D225 Melanocytic nevi of trunk: Secondary | ICD-10-CM | POA: Diagnosis not present

## 2021-04-17 DIAGNOSIS — Z1283 Encounter for screening for malignant neoplasm of skin: Secondary | ICD-10-CM | POA: Diagnosis not present

## 2021-04-17 DIAGNOSIS — D485 Neoplasm of uncertain behavior of skin: Secondary | ICD-10-CM | POA: Diagnosis not present

## 2021-05-09 DIAGNOSIS — E782 Mixed hyperlipidemia: Secondary | ICD-10-CM | POA: Diagnosis not present

## 2021-05-09 DIAGNOSIS — Z6839 Body mass index (BMI) 39.0-39.9, adult: Secondary | ICD-10-CM | POA: Diagnosis not present

## 2021-05-09 DIAGNOSIS — G4733 Obstructive sleep apnea (adult) (pediatric): Secondary | ICD-10-CM | POA: Diagnosis not present

## 2021-05-09 DIAGNOSIS — E1129 Type 2 diabetes mellitus with other diabetic kidney complication: Secondary | ICD-10-CM | POA: Diagnosis not present

## 2021-05-09 DIAGNOSIS — I2510 Atherosclerotic heart disease of native coronary artery without angina pectoris: Secondary | ICD-10-CM | POA: Diagnosis not present

## 2021-05-09 DIAGNOSIS — I10 Essential (primary) hypertension: Secondary | ICD-10-CM | POA: Diagnosis not present

## 2021-05-09 DIAGNOSIS — Z1331 Encounter for screening for depression: Secondary | ICD-10-CM | POA: Diagnosis not present

## 2021-05-09 DIAGNOSIS — Z0000 Encounter for general adult medical examination without abnormal findings: Secondary | ICD-10-CM | POA: Diagnosis not present

## 2021-05-10 ENCOUNTER — Encounter: Payer: Self-pay | Admitting: *Deleted

## 2021-05-20 ENCOUNTER — Other Ambulatory Visit: Payer: Self-pay | Admitting: Cardiovascular Disease

## 2021-06-08 ENCOUNTER — Emergency Department (HOSPITAL_COMMUNITY)
Admission: EM | Admit: 2021-06-08 | Discharge: 2021-06-08 | Disposition: A | Discharge status: Home / Self Care | Payer: Medicare PPO | Attending: Emergency Medicine | Admitting: Emergency Medicine

## 2021-06-08 ENCOUNTER — Encounter (HOSPITAL_COMMUNITY): Payer: Self-pay | Admitting: *Deleted

## 2021-06-08 DIAGNOSIS — B029 Zoster without complications: Secondary | ICD-10-CM | POA: Diagnosis not present

## 2021-06-08 DIAGNOSIS — R21 Rash and other nonspecific skin eruption: Secondary | ICD-10-CM | POA: Diagnosis not present

## 2021-06-08 MED ORDER — VALACYCLOVIR HCL 1 G PO TABS: 1000.0000 mg | ORAL_TABLET | Freq: Three times a day (TID) | ORAL | 0 refills | Status: DC

## 2021-06-08 MED ORDER — LIDOCAINE 5 % EX PTCH: 1.0000 | MEDICATED_PATCH | CUTANEOUS | 0 refills | Status: DC

## 2021-06-08 MED ORDER — LIDOCAINE 5 % EX PTCH
1.0000 | MEDICATED_PATCH | CUTANEOUS | Status: DC
  Administered 2021-06-08: 1 via TRANSDERMAL
  Filled 2021-06-08: qty 1

## 2021-06-08 NOTE — ED Provider Notes (Signed)
[facility] EMERGENCY DEPARTMENT Provider Note   CSN: [id number] Arrival date & time: 06/08/21  1708     History  Chief Complaint  Patient presents with   Rash    Kevin [name] is a 76 y.o. male.   Rash  Patient presents with a rash to the left torso.  States she noticed a burning sensation to that area on Monday, the rash appeared 2 days ago.  Erythematous, vesicles.  Patient reports he had the shingles vaccine 7 years ago, he only had 1 vaccine and is not sure if he was post have a second.  Home Medications Prior to Admission medications   Medication Sig Start Date End Date Taking? Authorizing Provider  lidocaine (LIDODERM) 5 % Place 1 patch onto the skin daily. Remove & Discard patch within 12 hours or as directed by MD 06/08/21  Yes Sherrill Raring, PA-C  niacin (NIASPAN) 1000 MG CR tablet TAKE 1 TABLET BY MOUTH AT BEDTIME. 05/21/21   Lorretta Harp, MD  valACYclovir (VALTREX) 1000 MG tablet Take 1 tablet (1,000 mg total) by mouth 3 (three) times daily. 06/08/21  Yes Sherrill Raring, PA-C  Ascorbic Acid (VITAMIN C) 1000 MG tablet Take 1,000 mg by mouth daily.    [provider]  aspirin 81 MG tablet Take 81 mg by mouth daily.    [provider]  cetirizine (ZYRTEC) 10 MG tablet Take 10 mg by mouth daily.    [provider]  clopidogrel (PLAVIX) 75 MG tablet TAKE ONE TABLET BY MOUTH ONCE DAILY. 10/16/20   Lorretta Harp, MD  Coenzyme Q10 (CO Q 10) 10 MG CAPS Take 1 capsule by mouth daily.    [provider]  diphenhydrAMINE (BENADRYL) 25 MG tablet Take 25 mg by mouth every 6 (six) hours as needed.    [provider]  EPINEPHrine (EPIPEN 2-PAK) 0.3 mg/0.3 mL IJ SOAJ injection Inject 0.3 mLs (0.3 mg total) into the muscle as needed for anaphylaxis. 01/12/19   Valentina Shaggy, MD  fluticasone [facility]) 50 MCG/ACT nasal spray Place 2 sprays into both nostrils daily.    [provider]  furosemide (LASIX) 40 MG tablet Take 40 mg by  mouth daily.    [provider]  HYDROcodone-acetaminophen (NORCO/VICODIN) 5-325 MG tablet Take 1 tablet by mouth every 4 (four) hours as needed. Patient not taking: Reported on 03/08/2021 08/22/20   Maudie Flakes, MD  levothyroxine (SYNTHROID) 50 MCG tablet levothyroxine 50 mcg tablet    [provider]  metoprolol (TOPROL-XL) 100 MG 24 hr tablet Take 100 mg by mouth daily.    [provider]  pravastatin (PRAVACHOL) 40 MG tablet Take 1 tablet (40 mg total) by mouth every evening. 04/18/19   Lorretta Harp, MD  RESVERATROL PO Take 1 capsule by mouth every morning.     [provider]  sitaGLIPtin (JANUVIA) 100 MG tablet Take 100 mg by mouth daily.    [provider]  tamsulosin (FLOMAX) 0.4 MG CAPS capsule Take 0.4 mg by mouth daily. 07/17/20   [provider]  valsartan-hydrochlorothiazide (DIOVAN-HCT) 160-12.5 MG per tablet Take 1 tablet by mouth daily.    [provider]      Allergies    Altace [ramipril], Avapro [irbesartan], Prednisone, Shellfish allergy, Tradjenta [linagliptin], Cephalexin, Clindamycin hcl, Codeine, Contrast media [iodinated contrast media], Fenofibrate, Glycotrol [support-500], Indomethacin, Minocycline hcl, Morphine and related, Sulfa drugs cross reactors, Tetracyclines & related, and Tree extract    Review of Systems  Review of Systems  Skin:  Positive for rash.   Physical Exam Updated Vital Signs BP (!) 154/72 (BP Location: Right Arm)    Pulse 78    Temp 97.7 F (36.5 C) (Oral)    Resp 20    Ht 5\' 9"  (1.753 m)    SpO2 98%    BMI 35.44 kg/m  Physical Exam Vitals and nursing note reviewed. Exam conducted with a chaperone present.  Constitutional:      General: He is not in acute distress.    Appearance: Normal appearance.  HENT:     Head: Normocephalic and atraumatic.  Eyes:     General: No scleral icterus.    Extraocular Movements: Extraocular movements intact.     Pupils: Pupils are equal,  round, and reactive to light.  Skin:    Coloration: Skin is not jaundiced.     Findings: Rash present.     Comments: Dermatomal rash with vesicles over erythema patch to left torso see picture below  Neurological:     Mental Status: He is alert. Mental status is at baseline.     Coordination: Coordination normal.     ED Results / Procedures / Treatments   Labs (all labs ordered are listed, but only abnormal results are displayed) Labs Reviewed - No data to display  EKG None  Radiology No results found.  Procedures Procedures    Medications Ordered in ED Medications  lidocaine (LIDODERM) 5 % 1 patch (has no administration in time range)    ED Course/ Medical Decision Making/ A&P                           Medical Decision Making Risk Prescription drug management.   This patient presents to the ED for concern of rash, this involves an extensive number of treatment options, and is a complaint that carries with it a high risk of complications and morbidity.  The differential diagnosis includes shingles, TN/SJS, other   Co morbidities that complicate the patient evaluation: CAD, h/o GI bleed (no NSAIDS).    Additional history obtained: -External records from outside source obtained and reviewed including: Chart review including previous notes, labs, imaging, consultation notes   Medicines ordered and prescription drug management: -I ordered medication including lidoderm  for pain  -Reevaluation of the patient after these medicines showed that the patient improved -I have reviewed the patients home medicines and have made adjustments as needed   ED Course: Patient is a very pleasant 76 year old male presenting with rash.  The rash is consistent with shingles, dermatomal distribution.  Will give Lidoderm for supportive care, and patient is not a candidate for anti-inflammatories given history of GI bleed.  Additionally he is on clopidogrel.  We will start the patient  on valacyclovir, do not feel he needs any additional work-up at this time.  He is not immunocompromised, GFR was greater than 60 and may of 2022 when it was last checked.   Disposition: D/C   GFR >60 09/24/2020.        Final Clinical Impression(s) / ED Diagnoses Final diagnoses:  Herpes zoster without complication    Rx / DC Orders ED Discharge Orders          Ordered    valACYclovir (VALTREX) 1000 MG tablet  3 times daily        06/08/21 1741    lidocaine (LIDODERM) 5 %  Every 24 hours  06/08/21 1746              Sherrill Raring, PA-C 06/08/21 1750    Noemi Chapel, MD 06/09/21 1459

## 2021-06-08 NOTE — Discharge Instructions (Addendum)
You have shingles.  Please take valacyclovir 3x daily for the next week.  He can also use Lidoderm patches over the rash to help alleviate pain.  Take Benadryl for any itching.  Unfortunately, I cannot see the year in which you receive your vaccine or which type of vaccine it was.  Currently the recommendations are to delay revaccination of shingles within a year after current infection.  Please discuss with your primary care doctor to figure out when the best time for you to get vaccinated would be in the future.  I am unable to advise.

## 2021-06-08 NOTE — ED Triage Notes (Signed)
Rash on left side of chest onset 2 days ago

## 2021-06-11 ENCOUNTER — Ambulatory Visit: Payer: Self-pay

## 2021-06-11 ENCOUNTER — Other Ambulatory Visit: Payer: Self-pay

## 2021-06-17 DIAGNOSIS — Z6841 Body Mass Index (BMI) 40.0 and over, adult: Secondary | ICD-10-CM | POA: Diagnosis not present

## 2021-06-17 DIAGNOSIS — B029 Zoster without complications: Secondary | ICD-10-CM | POA: Diagnosis not present

## 2021-07-15 ENCOUNTER — Other Ambulatory Visit: Payer: Self-pay | Admitting: Cardiovascular Disease

## 2021-08-08 ENCOUNTER — Ambulatory Visit (INDEPENDENT_AMBULATORY_CARE_PROVIDER_SITE_OTHER): Payer: Self-pay | Admitting: *Deleted

## 2021-08-08 DIAGNOSIS — R972 Elevated prostate specific antigen [PSA]: Secondary | ICD-10-CM | POA: Diagnosis not present

## 2021-08-08 DIAGNOSIS — Z1211 Encounter for screening for malignant neoplasm of colon: Secondary | ICD-10-CM

## 2021-08-08 NOTE — Progress Notes (Signed)
Noted. Agree.

## 2021-08-08 NOTE — Progress Notes (Signed)
Pt informed me that he is currently taking Plavix.  Made ov for pt.  Pt had former hx of lap band surgery.  About 1 to 2 years ago, pt says he was put on clear diet for 2 weeks due to issues with intestines.  He said that the doctor were worried about a possible obstruction happening.  Has occasional stomach aches now. ?

## 2021-08-12 DIAGNOSIS — I249 Acute ischemic heart disease, unspecified: Secondary | ICD-10-CM | POA: Diagnosis not present

## 2021-08-12 DIAGNOSIS — I2510 Atherosclerotic heart disease of native coronary artery without angina pectoris: Secondary | ICD-10-CM | POA: Diagnosis not present

## 2021-08-12 DIAGNOSIS — G4733 Obstructive sleep apnea (adult) (pediatric): Secondary | ICD-10-CM | POA: Diagnosis not present

## 2021-08-12 DIAGNOSIS — Z6841 Body Mass Index (BMI) 40.0 and over, adult: Secondary | ICD-10-CM | POA: Diagnosis not present

## 2021-08-12 DIAGNOSIS — N183 Chronic kidney disease, stage 3 unspecified: Secondary | ICD-10-CM | POA: Diagnosis not present

## 2021-08-12 DIAGNOSIS — E782 Mixed hyperlipidemia: Secondary | ICD-10-CM | POA: Diagnosis not present

## 2021-08-12 DIAGNOSIS — B029 Zoster without complications: Secondary | ICD-10-CM | POA: Diagnosis not present

## 2021-08-12 DIAGNOSIS — I10 Essential (primary) hypertension: Secondary | ICD-10-CM | POA: Diagnosis not present

## 2021-08-12 DIAGNOSIS — E1129 Type 2 diabetes mellitus with other diabetic kidney complication: Secondary | ICD-10-CM | POA: Diagnosis not present

## 2021-08-15 DIAGNOSIS — N401 Enlarged prostate with lower urinary tract symptoms: Secondary | ICD-10-CM | POA: Diagnosis not present

## 2021-08-15 DIAGNOSIS — R3912 Poor urinary stream: Secondary | ICD-10-CM | POA: Diagnosis not present

## 2021-08-15 DIAGNOSIS — R972 Elevated prostate specific antigen [PSA]: Secondary | ICD-10-CM | POA: Diagnosis not present

## 2021-08-21 NOTE — Progress Notes (Signed)
? ? ?GI Office Note   ? ?Referring Provider: Sharilyn Sites, MD ?Primary Care Physician:  Sharilyn Sites, MD  ?Primary GI: Dr. [name] ? ?Chief Complaint  ? ?Chief Complaint  ?Patient presents with  ? Colonoscopy  ?  Wants to wait till middle of June to schedule due to not having someone to go with him.   ? ? ? ?History of Present Illness  ? ?Kevin [name] is a 76 y.o. male with a history of asthma, CAD s/p stents in 2009, Diabetes, HTN, HLD, Melanoma, microscopic colitis, OSA not lately using CPAP, and lap band surgery presenting today at the request of Sharilyn Sites, MD for evaluation prior to colonoscopy.  ? ?Last colonoscopy June 2012 - normal rectum, long redundant colon, pancolonic diverticula, 32m cecal polyp, diminutive polyp at the appendiceal orifice ablated with tip of hot snare, biopsies taken (Biopsies suggestive of microscopic colitis; polyp benign; small bowel ok)  ? ?Last EGD June 2012 - Normal esophagus, lap band present, 2nd portion duodenum with erosions without frank ulcer ? ?Today: ?Denies dysphagia, reflux, melena, hematochezia, weight loss, abdominal pain, diarrhea, constipation, hematemesis, nausea, vomiting, lack of appetite.  He states that his stomach does sometimes make a lot of noise but does not have any pain.  Attention abnormalities are secondary to intermittent stomachache worsening in the ED, got x-rays ED doc told him it was a possible obstruction of small bowel rest for 2 weeks.  He has had no trouble since that time.  He does have a cardiac history and has stents and remains on Plavix for a long time as well as taking baby aspirin.  Denies any chest pain or shortness of breath. ? ?He reported no prior issues with his last colonoscopy in 2012, reported that he had moderate sedation and tolerated this very well.  He is requesting to have moderate sedation again, however explained to him that we do not do moderate sedation any longer.  He stated concern that [place] and does not want them to have to stay with him for very long after the procedure.  I let him know that this drug is very safe, and does not like general anesthesia that he had for shoulder surgery last year and that he should be awake and alert and not as groggy for as long of the time. ? ?Past HgbA1c 8.1 (has been eating ice cream) - back on the straight an narrow now and following his diet appropriately.  He reports that he is lost 6 pounds within the last week or 2 since his last doctor's appointment. ? ? ?Past Medical History:  ?Diagnosis Date  ? Angio-edema   ? Asthma   ? as a child. no problems now.   ? CAD (coronary artery disease)   ? Cellulitis 5/11  ? left leg  ? Diabetes mellitus   ? Diverticulosis   ? Diverticulosis   ? HTN (hypertension)   ? Hyperlipidemia   ? [place]  ? left foot  ? [place]  ? Microscopic colitis 10/09/10  ? Colonoscopy dr RGala Romney ? Obesity   ? OSA on CPAP 01/20/2014  ? Psoriasis   ? S/P colonoscopy 07/03/03  ?  Dr [name]->hyperplastic rectal polyp  ? S/P endoscopy 10/09/10  ? lap band intact, duodenal erosions  ? Seasonal allergies   ? Sleep apnea   ? Urticaria   ? ? ?  Past Surgical History:  ?Procedure Laterality Date  ? ABDOMINAL SURGERY    ? ADENOIDECTOMY    ? APPENDECTOMY    ? CARDIAC CATHETERIZATION N/A 02/14/2015  ? Procedure: Left Heart Cath and Coronary Angiography;  Surgeon: Jettie Booze, MD;  Location: [place] [facility];  Service: Cardiovascular;  Laterality: N/A;  ? CARDIAC CATHETERIZATION N/A 02/14/2015  ? Procedure: Coronary Stent Intervention;  Surgeon: Jettie Booze, MD;  Location: [place] [facility] LAB;  Service: Cardiovascular;  Laterality: N/A;  ? COLONOSCOPY N/A 10/09/2010  ? normal rectum, long redundant colon, pancolonic diverticula, 53m cecal polyp, diminutive polyp at the appendiceal orifice ablated with tip of hot snare (Biopsies suggestive of microscopic colitis;  polyp benign; small bowel ok)  ? CORONARY ANGIOPLASTY  2002/2005/2008  ? CORONARY ANGIOPLASTY WITH STENT PLACEMENT  06/11/2007  ? PTCA & stenting distal CX, PTCA & balloon angioplasty in-stent restenotic mid CX coronary artery stent  ? ESOPHAGOGASTRODUODENOSCOPY N/A 10/09/2010  ? Normal esophagus, lap band present, 2nd portion duodenum with erosions without frank ulcer  ? KNEE SURGERY    ? left  ? LEFT HEART CATHETERIZATION WITH CORONARY ANGIOGRAM N/A 07/15/2013  ? Procedure: LEFT HEART CATHETERIZATION WITH CORONARY ANGIOGRAM;  Surgeon: HSinclair Grooms MD;  [facility];  Service: Cardiovascular;  Laterality: N/A;  ? NM MYOCAR PERF WALL MOTION  06/24/2011  ? small mild fixed anteroseptal & anteroapical defects.  ? PENILE PROSTHESIS IMPLANT    ? SHOULDER SURGERY    ? left  ? SINOSCOPY    ? TONSILLECTOMY    ? TYMPANOSTOMY TUBE PLACEMENT    ? ? ?Current Outpatient Medications  ?Medication Sig Dispense Refill  ? Ascorbic Acid (VITAMIN C) 1000 MG tablet Take 1,000 mg by mouth daily.    ? aspirin 81 MG tablet Take 81 mg by mouth daily.    ? clopidogrel (PLAVIX) 75 MG tablet TAKE ONE TABLET BY MOUTH ONCE DAILY. 90 tablet 0  ? Coenzyme Q10 (CO Q 10) 10 MG CAPS Take 1 capsule by mouth daily.    ? diphenhydrAMINE (BENADRYL) 25 MG tablet Take 25 mg by mouth every 6 (six) hours as needed.    ? fluticasone (FLONASE) 50 MCG/ACT nasal spray Place 2 sprays into both nostrils daily.    ? furosemide (LASIX) 40 MG tablet Take 40 mg by mouth daily.    ? glipiZIDE (GLUCOTROL) 5 MG tablet Take 5 mg by mouth daily as needed.    ? levothyroxine (SYNTHROID) 50 MCG tablet levothyroxine 50 mcg tablet    ? metoprolol (TOPROL-XL) 100 MG 24 hr tablet Take 100 mg by mouth daily.    ? niacin (NIASPAN) 1000 MG CR tablet TAKE 1 TABLET BY MOUTH AT BEDTIME. 30 tablet 0  ? pravastatin (PRAVACHOL) 40 MG tablet Take 1 tablet (40 mg total) by mouth every evening. 90 tablet 3  ? RESVERATROL PO Take 1 capsule by mouth every morning.     ?  sitaGLIPtin (JANUVIA) 100 MG tablet Take 100 mg by mouth daily.    ? tamsulosin (FLOMAX) 0.4 MG CAPS capsule Take 0.4 mg by mouth daily.    ? valsartan-hydrochlorothiazide (DIOVAN-HCT) 160-12.5 MG per tablet Take 1 tablet by mouth daily.    ? ?No current facility-administered medications for this visit.  ? ? ?Allergies as of 08/22/2021 - Review Complete 08/22/2021  ?Allergen Reaction Noted  ? Altace [ramipril] Shortness Of Breath 07/15/2013  ? Avapro [irbesartan] Shortness Of Breath 10/01/2010  ? Prednisone Anaphylaxis, Swelling, and Other (See  Comments) 10/01/2010  ? Shellfish allergy Anaphylaxis 07/15/2013  ? Tradjenta [linagliptin] Shortness Of Breath 09/02/2013  ? Cephalexin Hives and Swelling 10/01/2010  ? Clindamycin hcl Hives and Swelling 10/01/2010  ? Codeine Nausea And Vomiting and Other (See Comments) 10/01/2010  ? Contrast media [iodinated contrast media]  11/15/2018  ? Fenofibrate Other (See Comments) 07/15/2013  ? Glycotrol [support-500] Other (See Comments) 07/15/2013  ? Indomethacin Other (See Comments) 10/01/2010  ? Minocycline hcl Hives and Swelling 10/01/2010  ? Morphine and related Nausea And Vomiting 10/01/2010  ? Sulfa drugs cross reactors Hives and Swelling 10/01/2010  ? Tetracyclines & related  10/01/2010  ? Tree extract Itching 11/15/2018  ? ? ?Family History  ?Problem Relation Age of Onset  ? Coronary artery disease Mother   ? Diabetes Father   ? Liver cancer Brother 61  ? Celiac disease Other 15  ?     brother's daughter  ? Allergic rhinitis Neg Hx   ? Angioedema Neg Hx   ? Asthma Neg Hx   ? Atopy Neg Hx   ? Eczema Neg Hx   ? Immunodeficiency Neg Hx   ? Urticaria Neg Hx   ? ? ?Social History  ? ?Socioeconomic History  ? Marital status: Divorced  ?  Spouse name: Not on file  ? Number of children: 1  ? Years of education: Not on file  ? Highest education level: Not on file  ?Occupational History  ? Occupation: Oceanographer  ?  Employer: [employer]  ?  Comment: [place] [place]  ?Tobacco Use  ?  Smoking status: Former  ?  Packs/day: 3.00  ?  Years: 30.00  ?  Pack years: 90.00  ?  Types: Cigarettes  ?  Start date: 11/18/1964  ?  Quit date: 10/04/1994  ?  Years since quitting: 26.9  ? Smokeless tobacco: [place]

## 2021-08-22 ENCOUNTER — Ambulatory Visit: Payer: Medicare PPO | Admitting: Gastroenterology

## 2021-08-22 ENCOUNTER — Encounter: Payer: Self-pay | Admitting: Gastroenterology

## 2021-08-22 VITALS — BP 122/62 | HR 70 | Temp 97.3°F | Ht 69.5 in | Wt 256.6 lb

## 2021-08-22 DIAGNOSIS — Z86010 Personal history of colonic polyps: Secondary | ICD-10-CM | POA: Diagnosis not present

## 2021-08-22 DIAGNOSIS — I2510 Atherosclerotic heart disease of native coronary artery without angina pectoris: Secondary | ICD-10-CM | POA: Diagnosis not present

## 2021-08-22 NOTE — Patient Instructions (Signed)
We are scheduling you for a colonoscopy in June with Dr. [name]. We will speak to him about possibility of doing moderate sedation.  ? ?We are requesting your most recent kidney function labs from your PCP as well as requesting clearance to hold your Plavix for 5 days prior to your procedure.  ? ?You will need to hold you diabetes medication the morning of your procedure. You may continue to take your aspirin 81 mg.  ? ?It was a pleasure to meet you today! ? ?It was a pleasure to see you today. I want to create trusting relationships with patients. If you receive a survey regarding your visit,  I greatly appreciate you taking time to fill this out on paper or through your MyChart. I value your feedback. ? ?Venetia Night, MSN, FNP-BC, AGACNP-BC ?[facility] ? ? ? ?

## 2021-09-03 DIAGNOSIS — Z85820 Personal history of malignant melanoma of skin: Secondary | ICD-10-CM | POA: Diagnosis not present

## 2021-09-03 DIAGNOSIS — Z1283 Encounter for screening for malignant neoplasm of skin: Secondary | ICD-10-CM | POA: Diagnosis not present

## 2021-09-03 DIAGNOSIS — Z08 Encounter for follow-up examination after completed treatment for malignant neoplasm: Secondary | ICD-10-CM | POA: Diagnosis not present

## 2021-09-05 ENCOUNTER — Telehealth: Payer: Self-pay | Admitting: *Deleted

## 2021-09-05 NOTE — Telephone Encounter (Signed)
LMOVM to call back to schedule TCS with Dr. [name], ASA 3 

## 2021-09-10 ENCOUNTER — Other Ambulatory Visit: Payer: Self-pay

## 2021-09-10 ENCOUNTER — Inpatient Hospital Stay (HOSPITAL_COMMUNITY)
Admission: EM | Admit: 2021-09-10 | Discharge: 2021-09-23 | DRG: 327 | Disposition: A | Discharge status: Skilled Nursing Facility | Payer: Medicare PPO | Attending: Internal Medicine | Admitting: Internal Medicine

## 2021-09-10 ENCOUNTER — Emergency Department (HOSPITAL_COMMUNITY): Payer: Medicare PPO

## 2021-09-10 ENCOUNTER — Encounter (HOSPITAL_COMMUNITY): Payer: Self-pay | Admitting: Emergency Medicine

## 2021-09-10 DIAGNOSIS — K56609 Unspecified intestinal obstruction, unspecified as to partial versus complete obstruction: Secondary | ICD-10-CM | POA: Diagnosis not present

## 2021-09-10 DIAGNOSIS — E1165 Type 2 diabetes mellitus with hyperglycemia: Secondary | ICD-10-CM | POA: Diagnosis not present

## 2021-09-10 DIAGNOSIS — I25119 Atherosclerotic heart disease of native coronary artery with unspecified angina pectoris: Secondary | ICD-10-CM | POA: Diagnosis not present

## 2021-09-10 DIAGNOSIS — Z91013 Allergy to seafood: Secondary | ICD-10-CM | POA: Diagnosis not present

## 2021-09-10 DIAGNOSIS — Z881 Allergy status to other antibiotic agents status: Secondary | ICD-10-CM

## 2021-09-10 DIAGNOSIS — E669 Obesity, unspecified: Secondary | ICD-10-CM | POA: Diagnosis not present

## 2021-09-10 DIAGNOSIS — Z9884 Bariatric surgery status: Secondary | ICD-10-CM

## 2021-09-10 DIAGNOSIS — Z91041 Radiographic dye allergy status: Secondary | ICD-10-CM

## 2021-09-10 DIAGNOSIS — Z8379 Family history of other diseases of the digestive system: Secondary | ICD-10-CM

## 2021-09-10 DIAGNOSIS — Z888 Allergy status to other drugs, medicaments and biological substances status: Secondary | ICD-10-CM

## 2021-09-10 DIAGNOSIS — Z955 Presence of coronary angioplasty implant and graft: Secondary | ICD-10-CM | POA: Diagnosis not present

## 2021-09-10 DIAGNOSIS — Z885 Allergy status to narcotic agent status: Secondary | ICD-10-CM

## 2021-09-10 DIAGNOSIS — I10 Essential (primary) hypertension: Secondary | ICD-10-CM | POA: Diagnosis not present

## 2021-09-10 DIAGNOSIS — Z87891 Personal history of nicotine dependence: Secondary | ICD-10-CM

## 2021-09-10 DIAGNOSIS — Z6837 Body mass index (BMI) 37.0-37.9, adult: Secondary | ICD-10-CM | POA: Diagnosis not present

## 2021-09-10 DIAGNOSIS — G4733 Obstructive sleep apnea (adult) (pediatric): Secondary | ICD-10-CM | POA: Diagnosis present

## 2021-09-10 DIAGNOSIS — E1122 Type 2 diabetes mellitus with diabetic chronic kidney disease: Secondary | ICD-10-CM | POA: Diagnosis present

## 2021-09-10 DIAGNOSIS — N1831 Chronic kidney disease, stage 3a: Secondary | ICD-10-CM | POA: Diagnosis present

## 2021-09-10 DIAGNOSIS — I129 Hypertensive chronic kidney disease with stage 1 through stage 4 chronic kidney disease, or unspecified chronic kidney disease: Secondary | ICD-10-CM | POA: Diagnosis not present

## 2021-09-10 DIAGNOSIS — N179 Acute kidney failure, unspecified: Secondary | ICD-10-CM | POA: Diagnosis not present

## 2021-09-10 DIAGNOSIS — K5651 Intestinal adhesions [bands], with partial obstruction: Secondary | ICD-10-CM | POA: Diagnosis not present

## 2021-09-10 DIAGNOSIS — I2510 Atherosclerotic heart disease of native coronary artery without angina pectoris: Secondary | ICD-10-CM | POA: Diagnosis present

## 2021-09-10 DIAGNOSIS — Z4651 Encounter for fitting and adjustment of gastric lap band: Secondary | ICD-10-CM | POA: Diagnosis not present

## 2021-09-10 DIAGNOSIS — Z7902 Long term (current) use of antithrombotics/antiplatelets: Secondary | ICD-10-CM | POA: Diagnosis not present

## 2021-09-10 DIAGNOSIS — N400 Enlarged prostate without lower urinary tract symptoms: Secondary | ICD-10-CM | POA: Diagnosis not present

## 2021-09-10 DIAGNOSIS — E869 Volume depletion, unspecified: Secondary | ICD-10-CM | POA: Diagnosis present

## 2021-09-10 DIAGNOSIS — K562 Volvulus: Principal | ICD-10-CM

## 2021-09-10 DIAGNOSIS — Z800 Family history of malignant neoplasm of digestive organs: Secondary | ICD-10-CM

## 2021-09-10 DIAGNOSIS — Z7984 Long term (current) use of oral hypoglycemic drugs: Secondary | ICD-10-CM

## 2021-09-10 DIAGNOSIS — Z4682 Encounter for fitting and adjustment of non-vascular catheter: Secondary | ICD-10-CM | POA: Diagnosis not present

## 2021-09-10 DIAGNOSIS — E782 Mixed hyperlipidemia: Secondary | ICD-10-CM

## 2021-09-10 DIAGNOSIS — R110 Nausea: Secondary | ICD-10-CM

## 2021-09-10 DIAGNOSIS — Z85820 Personal history of malignant melanoma of skin: Secondary | ICD-10-CM

## 2021-09-10 DIAGNOSIS — Z79899 Other long term (current) drug therapy: Secondary | ICD-10-CM

## 2021-09-10 DIAGNOSIS — Z7982 Long term (current) use of aspirin: Secondary | ICD-10-CM

## 2021-09-10 DIAGNOSIS — R1084 Generalized abdominal pain: Secondary | ICD-10-CM

## 2021-09-10 DIAGNOSIS — R109 Unspecified abdominal pain: Secondary | ICD-10-CM

## 2021-09-10 DIAGNOSIS — Z8249 Family history of ischemic heart disease and other diseases of the circulatory system: Secondary | ICD-10-CM

## 2021-09-10 DIAGNOSIS — M109 Gout, unspecified: Secondary | ICD-10-CM | POA: Diagnosis not present

## 2021-09-10 DIAGNOSIS — K6389 Other specified diseases of intestine: Secondary | ICD-10-CM | POA: Diagnosis not present

## 2021-09-10 DIAGNOSIS — I700 Atherosclerosis of aorta: Secondary | ICD-10-CM | POA: Diagnosis not present

## 2021-09-10 DIAGNOSIS — K5730 Diverticulosis of large intestine without perforation or abscess without bleeding: Secondary | ICD-10-CM | POA: Diagnosis not present

## 2021-09-10 DIAGNOSIS — Z833 Family history of diabetes mellitus: Secondary | ICD-10-CM

## 2021-09-10 DIAGNOSIS — Z79890 Hormone replacement therapy (postmenopausal): Secondary | ICD-10-CM

## 2021-09-10 LAB — CBC WITH DIFFERENTIAL/PLATELET
Abs Immature Granulocytes: 0.04 10*3/uL (ref 0.00–0.07)
Basophils Absolute: 0 10*3/uL (ref 0.0–0.1)
Basophils Relative: 1 %
Eosinophils Absolute: 0.1 10*3/uL (ref 0.0–0.5)
Eosinophils Relative: 2 %
HCT: 41.5 % (ref 39.0–52.0)
Hemoglobin: 14 g/dL (ref 13.0–17.0)
Immature Granulocytes: 1 %
Lymphocytes Relative: 13 %
Lymphs Abs: 1.1 10*3/uL (ref 0.7–4.0)
MCH: 32.1 pg (ref 26.0–34.0)
MCHC: 33.7 g/dL (ref 30.0–36.0)
MCV: 95.2 fL (ref 80.0–100.0)
Monocytes Absolute: 0.6 10*3/uL (ref 0.1–1.0)
Monocytes Relative: 8 %
Neutro Abs: 6.2 10*3/uL (ref 1.7–7.7)
Neutrophils Relative %: 75 %
Platelets: 192 10*3/uL (ref 150–400)
RBC: 4.36 MIL/uL (ref 4.22–5.81)
RDW: 13.5 % (ref 11.5–15.5)
WBC: 8 10*3/uL (ref 4.0–10.5)
nRBC: 0 % (ref 0.0–0.2)

## 2021-09-10 LAB — COMPREHENSIVE METABOLIC PANEL
ALT: 18 U/L (ref 0–44)
AST: 18 U/L (ref 15–41)
Albumin: 4.8 g/dL (ref 3.5–5.0)
Alkaline Phosphatase: 32 U/L — ABNORMAL LOW (ref 38–126)
Anion gap: 11 (ref 5–15)
BUN: 95 mg/dL — ABNORMAL HIGH (ref 8–23)
CO2: 22 mmol/L (ref 22–32)
Calcium: 10.2 mg/dL (ref 8.9–10.3)
Chloride: 104 mmol/L (ref 98–111)
Creatinine, Ser: 3.42 mg/dL — ABNORMAL HIGH (ref 0.61–1.24)
GFR, Estimated: 18 mL/min — ABNORMAL LOW (ref >60)
Glucose, Bld: 279 mg/dL — ABNORMAL HIGH (ref 70–99)
Potassium: 4.1 mmol/L (ref 3.5–5.1)
Sodium: 137 mmol/L (ref 135–145)
Total Bilirubin: 0.8 mg/dL (ref 0.3–1.2)
Total Protein: 8.1 g/dL (ref 6.5–8.1)

## 2021-09-10 LAB — LIPASE, BLOOD: Lipase: 38 U/L (ref 11–51)

## 2021-09-10 MED ORDER — LACTATED RINGERS IV SOLN: INTRAVENOUS | Status: DC

## 2021-09-10 MED ORDER — FENTANYL CITRATE PF 50 MCG/ML IJ SOSY
100.0000 ug | PREFILLED_SYRINGE | Freq: Once | INTRAMUSCULAR | Status: AC
  Administered 2021-09-10: 100 ug via INTRAVENOUS
  Filled 2021-09-10: qty 2

## 2021-09-10 MED ORDER — FENTANYL CITRATE PF 50 MCG/ML IJ SOSY
100.0000 ug | PREFILLED_SYRINGE | Freq: Once | INTRAMUSCULAR | Status: AC
Start: 2021-09-10 — End: 2021-09-10
  Administered 2021-09-10: 100 ug via INTRAVENOUS
  Filled 2021-09-10: qty 2

## 2021-09-10 MED ORDER — HEPARIN SODIUM (PORCINE) 5000 UNIT/ML IJ SOLN: 5000.0000 [IU] | Freq: Three times a day (TID) | INTRAMUSCULAR | Status: DC

## 2021-09-10 MED ORDER — ONDANSETRON HCL 4 MG/2ML IJ SOLN
4.0000 mg | Freq: Four times a day (QID) | INTRAMUSCULAR | Status: DC | PRN
Start: 2021-09-10 — End: 2021-09-23
  Administered 2021-09-11 – 2021-09-14 (×5): 4 mg via INTRAVENOUS
  Filled 2021-09-10 (×5): qty 2

## 2021-09-10 MED ORDER — HYDROMORPHONE HCL 1 MG/ML IJ SOLN
0.5000 mg | INTRAMUSCULAR | Status: DC | PRN
  Administered 2021-09-11 (×2): 0.5 mg via INTRAVENOUS
  Filled 2021-09-10 (×2): qty 0.5

## 2021-09-10 MED ORDER — ONDANSETRON HCL 4 MG/2ML IJ SOLN
4.0000 mg | Freq: Once | INTRAMUSCULAR | Status: AC
  Administered 2021-09-10: 4 mg via INTRAVENOUS
  Filled 2021-09-10: qty 2

## 2021-09-10 MED ORDER — LIDOCAINE VISCOUS HCL 2 % MT SOLN
15.0000 mL | Freq: Once | OROMUCOSAL | Status: AC
  Administered 2021-09-10: 15 mL via OROMUCOSAL
  Filled 2021-09-10: qty 15

## 2021-09-10 NOTE — ED Triage Notes (Signed)
Pt having abdominal pain with one episode of vomiting today, had lapband surgery in 2011. ?

## 2021-09-10 NOTE — H&P (Signed)
?History and Physical  ? ? ?Patient: Kevin [name] NTI:[id number] DOB: [birth year] ?DOA: 09/10/2021 ?DOS: the patient was seen and examined on 09/11/2021 ?PCP: Sharilyn Sites, MD  ?Patient coming from: Home ? ?Chief Complaint:  ?Chief Complaint  ?Patient presents with  ? Abdominal Pain  ? ?HPI: Kevin [name] is a 76 y.o. male with medical history significant of CAD s/p PCI, hypertension, hyperlipidemia, type 2 diabetes with who presents to the emergency department due to abdominal pain and nausea which started yesterday.  Patient complained of sudden onset of abdominal pain which was associated with nausea and dry heaving.  He complained of abdominal distention since onset of symptoms, stated to go to the ED for further evaluation and management.  Of note, patient had a lap band done in [facility] in 2011.  He denies chest pain, shortness of breath, fever, chills. ? ?ED Course:  ?In the emergency department, he was hemodynamically stable.  Work-up in the ED showed normal CBC. BMP showed BUN/creatinine 95/3.42 (baseline creatinine at 1.1-1.6), CBG 279, lipase 38. ?CT abdomen and pelvis without contrast showed Mid small bowel obstruction with point of transition within the left mid abdomen. Underlying adhesions suspected. Superimposed mildly incomplete duodenal rotation. No free intraperitoneal gas or fluid identified. ?He was treated with IV fentanyl and Zofran.  General surgery was consulted and recommended admitting patient with plan to see patient in the morning per ED physician.  Hospitalist was asked to admit patient for further evaluation and management ? ?Review of Systems: ?Review of systems as noted in the HPI. All other systems reviewed and are negative. ? ? ?Past Medical History:  ?Diagnosis Date  ? Angio-edema   ? Asthma   ? as a child. no problems now.   ? CAD (coronary artery disease)   ? Cellulitis 5/11  ? left leg  ? Diabetes mellitus   ? Diverticulosis   ? Diverticulosis   ? HTN (hypertension)   ?  Hyperlipidemia   ? Melanoma ([place]) 10/11  ? left foot  ? Melanoma of foot ([place]) 06/02/2011  ? Microscopic colitis 10/09/10  ? Colonoscopy dr [name]  ? Obesity   ? OSA on CPAP 01/20/2014  ? Psoriasis   ? S/P colonoscopy 07/03/03  ?  Dr [name]->hyperplastic rectal polyp  ? S/P endoscopy 10/09/10  ? lap band intact, duodenal erosions  ? Seasonal allergies   ? Sleep apnea   ? Urticaria   ? ?Past Surgical History:  ?Procedure Laterality Date  ? ABDOMINAL SURGERY    ? ADENOIDECTOMY    ? APPENDECTOMY    ? CARDIAC CATHETERIZATION N/A 02/14/2015  ? Procedure: Left Heart Cath and Coronary Angiography;  Surgeon: Jettie Booze, MD;  Location: [facility];  Service: Cardiovascular;  Laterality: N/A;  ? CARDIAC CATHETERIZATION N/A 02/14/2015  ? Procedure: Coronary Stent Intervention;  Surgeon: Jettie Booze, MD;  Location: [facility];  Service: Cardiovascular;  Laterality: N/A;  ? COLONOSCOPY N/A 10/09/2010  ? normal rectum, long redundant colon, pancolonic diverticula, 71m cecal polyp, diminutive polyp at the appendiceal orifice ablated with tip of hot snare (Biopsies suggestive of microscopic colitis; polyp benign; small bowel ok)  ? CORONARY ANGIOPLASTY  2002/2005/2008  ? CORONARY ANGIOPLASTY WITH STENT PLACEMENT  06/11/2007  ? PTCA & stenting distal CX, PTCA & balloon angioplasty in-stent restenotic mid CX coronary artery stent  ? ESOPHAGOGASTRODUODENOSCOPY N/A 10/09/2010  ? Normal esophagus, lap band present, 2nd portion duodenum with erosions without frank ulcer  ? KNEE SURGERY    ?  left  ? LEFT HEART CATHETERIZATION WITH CORONARY ANGIOGRAM N/A 07/15/2013  ? Procedure: LEFT HEART CATHETERIZATION WITH CORONARY ANGIOGRAM;  Surgeon: Sinclair Grooms, MD;  Location: [facility] CATH LAB;  Service: Cardiovascular;  Laterality: N/A;  ? NM MYOCAR PERF WALL MOTION  06/24/2011  ? small mild fixed anteroseptal & anteroapical defects.  ? PENILE PROSTHESIS IMPLANT    ? SHOULDER SURGERY    ? left  ? SINOSCOPY    ? TONSILLECTOMY     ? TYMPANOSTOMY TUBE PLACEMENT    ? ? ?Social History:  reports that he quit smoking about 26 years ago. His smoking use included cigarettes. He started smoking about 56 years ago. He has a 90.00 pack-year smoking history. He has never used smokeless tobacco. He reports current alcohol use. He reports that he does not use drugs. ? ? ?Allergies  ?Allergen Reactions  ? Altace [Ramipril] Shortness Of Breath  ? Avapro [Irbesartan] Shortness Of Breath  ? Prednisone Anaphylaxis, Swelling and Other (See Comments)  ?  SWELLING OF THE LIPS  ? Shellfish Allergy Anaphylaxis  ?  Shrimp, selfish  ? Tradjenta [Linagliptin] Shortness Of Breath  ? Cephalexin Hives and Swelling  ? Clindamycin Hcl Hives and Swelling  ? Codeine Nausea And Vomiting and Other (See Comments)  ?  AFFECTS HEART  ? Contrast Media [Iodinated Contrast Media]   ?  Can be administered if Benadryl is administered prior to prevent reaction   ? Fenofibrate Other (See Comments)  ?  "My skin started peeling off"  ? Glycotrol [Support-500] Other (See Comments)  ?  unknown  ? Indomethacin Other (See Comments)  ?  UNKNOWN REACTION  ? Minocycline Hcl Hives and Swelling  ? Morphine And Related Nausea And Vomiting  ?  PROJECTILE VOMITING  ? Sulfa Drugs Cross Reactors Hives and Swelling  ? Tetracyclines & Related   ? Tree Extract Itching  ? ? ?Family History  ?Problem Relation Age of Onset  ? Coronary artery disease Mother   ? Diabetes Father   ? Liver cancer Brother 85  ? Celiac disease Other 65  ?     brother's daughter  ? Allergic rhinitis Neg Hx   ? Angioedema Neg Hx   ? Asthma Neg Hx   ? Atopy Neg Hx   ? Eczema Neg Hx   ? Immunodeficiency Neg Hx   ? Urticaria Neg Hx   ?  ? ?Prior to Admission medications   ?Medication Sig Start Date End Date Taking? Authorizing Provider  ?Ascorbic Acid (VITAMIN C) 1000 MG tablet Take 1,000 mg by mouth daily.   Yes [provider]  ?aspirin 81 MG tablet Take 81 mg by mouth daily.   Yes [provider]  ?clopidogrel  (PLAVIX) 75 MG tablet TAKE ONE TABLET BY MOUTH ONCE DAILY. 07/16/21  Yes Lorretta Harp, MD  ?Coenzyme Q10 (CO Q 10) 10 MG CAPS Take 1 capsule by mouth daily.   Yes [provider]  ?diphenhydrAMINE (BENADRYL) 25 MG tablet Take 25 mg by mouth every 6 (six) hours as needed.   Yes [provider]  ?fluticasone (FLONASE) 50 MCG/ACT nasal spray Place 2 sprays into both nostrils daily.   Yes [provider]  ?furosemide (LASIX) 40 MG tablet Take 40 mg by mouth daily.   Yes [provider]  ?glipiZIDE (GLUCOTROL) 5 MG tablet Take 5 mg by mouth daily as needed.   Yes [provider]  ?levothyroxine (SYNTHROID) 50 MCG tablet Take 50 mcg by mouth daily  before breakfast.   Yes [provider]  ?metoprolol (TOPROL-XL) 100 MG 24 hr tablet Take 100 mg by mouth daily.   Yes [provider]  ?niacin (NIASPAN) 1000 MG CR tablet TAKE 1 TABLET BY MOUTH AT BEDTIME. 05/21/21  Yes Lorretta Harp, MD  ?pravastatin (PRAVACHOL) 40 MG tablet Take 1 tablet (40 mg total) by mouth every evening. 04/18/19  Yes Lorretta Harp, MD  ?RESVERATROL PO Take 1 capsule by mouth every morning.    Yes [provider]  ?sitaGLIPtin (JANUVIA) 100 MG tablet Take 100 mg by mouth daily.   Yes [provider]  ?tamsulosin (FLOMAX) 0.4 MG CAPS capsule Take 0.4 mg by mouth daily. 07/17/20  Yes [provider]  ?valsartan-hydrochlorothiazide (DIOVAN-HCT) 160-12.5 MG per tablet Take 1 tablet by mouth daily.   Yes [provider]  ? ? ?Physical Exam: ?BP (!) 165/78 (BP Location: Right Arm)   Pulse 87   Temp 98.5 ?F (36.9 ?C)   Resp 17   Ht 5' 9.5" (1.765 m)   Wt 116.7 kg   SpO2 96%   BMI 37.45 kg/m?  ? ?General: 76 y.o. year-old male well developed well nourished with NG tube intact, but in no acute distress.  Alert and oriented x3. ?HEENT: NCAT, EOMI ?Neck: Supple, trachea medial ?Cardiovascular: Regular rate and rhythm with no rubs or gallops.  No  thyromegaly or JVD noted.  No lower extremity edema. 2/4 pulses in all 4 extremities. ?Respiratory: Clear to auscultation with no wheezes or rales. Good inspiratory effort. ?Abdomen: Soft, tender to palpation diffusely,

## 2021-09-10 NOTE — Progress Notes (Signed)
Called by Dr. Laverta Baltimore regarding patient.  Patient presents to the emergency department with abdominal pain, nausea, and vomiting.  He denies passing flatus or bowel movements today.  He underwent a CT abdomen and pelvis demonstrating mid small bowel obstruction with point of transition within the left midabdomen, appropriate position of gastric lap band.  Patient is currently on Plavix for heart stents, the most recent of which was placed in 2016.  He has no leukocytosis and vital signs are within normal limits. ? ?Plan: ?-Admit to hospitalist ?-NG tube placement, NG tube to LIS ?-NPO ?-IV fluids ?-As needed antiemetics and pain medications, would limit narcotic medications ?-I will plan to see the patient tomorrow morning.  Please call with any questions or concerns ? ?Ayodeji Keimig, DO ?[facility] ?[place], [place] [address] ?[phone] (office) ? ?

## 2021-09-10 NOTE — ED Provider Notes (Signed)
? ?Emergency Department Provider Note ? ? ?I have reviewed the triage vital signs and the nursing notes. ? ? ?HISTORY ? ?Chief Complaint ?Abdominal Pain ? ? ?HPI ?Kevin [name] is a 76 y.o. male with past medical history reviewed below presents to the emergency department with abdominal pain along with nausea and vomiting.  Symptoms began yesterday.  Patient is not passing anything from below.  He has had progressively worsening symptoms and distention which ultimately prompted him to seek ED evaluation.  He has history of a Lap-Band done in the [facility] system in 2011.  No recent adjustments.  No known fevers.  Patient denies any chest pain/pressure/tightness. No SOB. Has prior CAD history and notes this does not feel similar. ? ? ?Past Medical History:  ?Diagnosis Date  ? Angio-edema   ? Asthma   ? as a child. no problems now.   ? CAD (coronary artery disease)   ? Cellulitis 5/11  ? left leg  ? Diabetes mellitus   ? Diverticulosis   ? Diverticulosis   ? HTN (hypertension)   ? Hyperlipidemia   ? Melanoma ([place]) 10/11  ? left foot  ? Melanoma of foot ([place]) 06/02/2011  ? Microscopic colitis 10/09/10  ? Colonoscopy dr [name]  ? Obesity   ? OSA on CPAP 01/20/2014  ? Psoriasis   ? S/P colonoscopy 07/03/03  ?  Dr [name]->hyperplastic rectal polyp  ? S/P endoscopy 10/09/10  ? lap band intact, duodenal erosions  ? Seasonal allergies   ? Sleep apnea   ? Urticaria   ? ? ?Review of Systems ? ?Constitutional: No fever/chills ?Eyes: No visual changes. ?ENT: No sore throat. ?Cardiovascular: Denies chest pain. ?Respiratory: Denies shortness of breath. ?Gastrointestinal: Positive abdominal pain. Positive nausea and vomiting.  No diarrhea.  No constipation. ?Genitourinary: Negative for dysuria. ?Musculoskeletal: Negative for back pain. ?Skin: Negative for rash. ?Neurological: Negative for headaches, focal weakness or numbness. ? ?____________________________________________ ? ? ?PHYSICAL EXAM: ? ?VITAL SIGNS: ?ED Triage Vitals  ?Enc  Vitals Group  ?   BP 09/10/21 1730 138/74  ?   Pulse Rate 09/10/21 1730 76  ?   Resp 09/10/21 1730 18  ?   Temp 09/10/21 1730 (!) 97.5 ?F (36.4 ?C)  ?   Temp src --   ?   SpO2 09/10/21 1730 98 %  ?   Weight 09/10/21 1729 260 lb (117.9 kg)  ?   Height 09/10/21 1729 5' 9.5" (1.765 m)  ? ?Constitutional: Alert and oriented. Well appearing and in no acute distress. ?Eyes: Conjunctivae are normal.  ?Head: Atraumatic. ?Nose: No congestion/rhinnorhea. ?Mouth/Throat: Mucous membranes are moist. ?Neck: No stridor.   ?Cardiovascular: Normal rate, regular rhythm. Good peripheral circulation. Grossly normal heart sounds.   ?Respiratory: Normal respiratory effort.  No retractions. Lungs CTAB. ?Gastrointestinal: Soft with diffuse epigastric tenderness, no peritonitis. Positive distention.  ?Musculoskeletal: No lower extremity tenderness nor edema. No gross deformities of extremities. ?Neurologic:  Normal speech and language. No gross focal neurologic deficits are appreciated.  ?Skin:  Skin is warm, dry and intact. No rash noted. ? ?____________________________________________ ?  ?LABS ?(all labs ordered are listed, but only abnormal results are displayed) ? ?Labs Reviewed  ?COMPREHENSIVE METABOLIC PANEL - Abnormal; Notable for the following components:  ?    Result Value  ? Glucose, Bld 279 (*)   ? BUN 95 (*)   ? Creatinine, Ser 3.42 (*)   ? Alkaline Phosphatase 32 (*)   ? GFR, Estimated 18 (*)   ? All  other components within normal limits  ?URINALYSIS, ROUTINE W REFLEX MICROSCOPIC - Abnormal; Notable for the following components:  ? Glucose, UA 150 (*)   ? All other components within normal limits  ?COMPREHENSIVE METABOLIC PANEL - Abnormal; Notable for the following components:  ? CO2 21 (*)   ? Glucose, Bld 255 (*)   ? BUN 94 (*)   ? Creatinine, Ser 2.97 (*)   ? Alkaline Phosphatase 31 (*)   ? GFR, Estimated 21 (*)   ? All other components within normal limits  ?PHOSPHORUS - Abnormal; Notable for the following components:  ?  Phosphorus 4.8 (*)   ? All other components within normal limits  ?HEMOGLOBIN A1C - Abnormal; Notable for the following components:  ? Hgb A1c MFr Bld 7.7 (*)   ? All other components within normal limits  ?GLUCOSE, CAPILLARY - Abnormal; Notable for the following components:  ? Glucose-Capillary 220 (*)   ? All other components within normal limits  ?GLUCOSE, CAPILLARY - Abnormal; Notable for the following components:  ? Glucose-Capillary 252 (*)   ? All other components within normal limits  ?GLUCOSE, CAPILLARY - Abnormal; Notable for the following components:  ? Glucose-Capillary 240 (*)   ? All other components within normal limits  ?GLUCOSE, CAPILLARY - Abnormal; Notable for the following components:  ? Glucose-Capillary 212 (*)   ? All other components within normal limits  ?GLUCOSE, CAPILLARY - Abnormal; Notable for the following components:  ? Glucose-Capillary 162 (*)   ? All other components within normal limits  ?CBC WITH DIFFERENTIAL/PLATELET  ?LIPASE, BLOOD  ?CBC  ?APTT  ?MAGNESIUM  ?BASIC METABOLIC PANEL  ?CBC  ?MAGNESIUM  ? ?____________________________________________ ? ?RADIOLOGY ? ?DG Abd 1 View ? ?Result Date: 09/11/2021 ?CLINICAL DATA:  NG tube placement. EXAM: ABDOMEN - 1 VIEW COMPARISON:  10/08/2010 FINDINGS: 1305 hours. NG tube tip is in the mid stomach. Visualized upper abdomen reveals gaseous bowel distension. Lap band overlies the medial left upper quadrant. IMPRESSION: NG tube tip is in the mid stomach. Lap band noted in the region of the esophagogastric junction. Electronically Signed   By: Misty Stanley M.D.   On: 09/11/2021 13:13   ? ?____________________________________________ ? ? ?PROCEDURES ? ?Procedure(s) performed:  ? ?Procedures ? ?None  ?____________________________________________ ? ? ?INITIAL IMPRESSION / ASSESSMENT AND PLAN / ED COURSE ? ?Pertinent labs & imaging results that were available during my care of the patient were reviewed by me and considered in my medical decision  making (see chart for details). ?  ?This patient is Presenting for Evaluation of abdominal pain, which does require a range of treatment options, and is a complaint that involves a high risk of morbidity and mortality. ? ?The Differential Diagnoses includes but is not exclusive to acute cholecystitis, intrathoracic causes for epigastric abdominal pain, gastritis, duodenitis, pancreatitis, small bowel or large bowel obstruction, abdominal aortic aneurysm, hernia, gastritis, etc. ? ? ?Critical Interventions-  ?  ?Medications  ?lactated ringers infusion ( Intravenous New Bag/Given 09/11/21 2220)  ?ondansetron [facility]) injection 4 mg (4 mg Intravenous Given 09/11/21 2049)  ?insulin aspart (novoLOG) injection 0-15 Units (3 Units Subcutaneous Given 09/11/21 2050)  ?HYDROmorphone (DILAUDID) injection 0.5 mg (0.5 mg Intravenous Given 09/11/21 2049)  ?metoprolol tartrate (LOPRESSOR) injection 2.5 mg (2.5 mg Intravenous Given 09/11/21 2049)  ?hydrALAZINE (APRESOLINE) injection 10 mg (has no administration in time range)  ?bisacodyl (DULCOLAX) suppository 10 mg (10 mg Rectal Patient Refused/Not Given 09/11/21 1335)  ?insulin glargine-yfgn (SEMGLEE) injection 15 Units (15 Units Subcutaneous Given  09/11/21 1718)  ?fentaNYL (SUBLIMAZE) injection 100 mcg (100 mcg Intravenous Given 09/10/21 1832)  ?ondansetron [facility]) injection 4 mg (4 mg Intravenous Given 09/10/21 1830)  ?lidocaine (XYLOCAINE) 2 % viscous mouth solution 15 mL (15 mLs Mouth/Throat Given 09/10/21 2217)  ?fentaNYL (SUBLIMAZE) injection 100 mcg (100 mcg Intravenous Given 09/10/21 2232)  ? ? ?Reassessment after intervention: Patient feeling slightly improved after NG tube placement.  ? ? ?I decided to review pertinent External Data, and in summary last ED visit with [place] in February for unrelated issue. ?  ?Clinical Laboratory Tests Ordered, included no UTI.  Patient with significant acute kidney injury with creatinine now at 3.42 and elevated BUN.  Normal electrolytes and CO2.  No  anemia. ? ?Radiologic Tests Ordered, included CT abdomen/pelvis. I independently interpreted the images and agree with radiology interpretation.  ? ?Cardiac Monitor Tracing which shows NSR. ? ? ?Social Determ

## 2021-09-10 NOTE — ED Notes (Signed)
Patient transported to CT 

## 2021-09-11 ENCOUNTER — Inpatient Hospital Stay (HOSPITAL_COMMUNITY): Payer: Medicare PPO

## 2021-09-11 DIAGNOSIS — E669 Obesity, unspecified: Secondary | ICD-10-CM

## 2021-09-11 DIAGNOSIS — R109 Unspecified abdominal pain: Secondary | ICD-10-CM

## 2021-09-11 DIAGNOSIS — E1165 Type 2 diabetes mellitus with hyperglycemia: Secondary | ICD-10-CM

## 2021-09-11 DIAGNOSIS — K56609 Unspecified intestinal obstruction, unspecified as to partial versus complete obstruction: Secondary | ICD-10-CM | POA: Diagnosis not present

## 2021-09-11 DIAGNOSIS — N179 Acute kidney failure, unspecified: Secondary | ICD-10-CM

## 2021-09-11 DIAGNOSIS — I10 Essential (primary) hypertension: Secondary | ICD-10-CM | POA: Diagnosis not present

## 2021-09-11 DIAGNOSIS — I2510 Atherosclerotic heart disease of native coronary artery without angina pectoris: Secondary | ICD-10-CM | POA: Diagnosis not present

## 2021-09-11 DIAGNOSIS — N1831 Chronic kidney disease, stage 3a: Secondary | ICD-10-CM

## 2021-09-11 LAB — URINALYSIS, ROUTINE W REFLEX MICROSCOPIC
Bilirubin Urine: NEGATIVE
Glucose, UA: 150 mg/dL — AB
Hgb urine dipstick: NEGATIVE
Ketones, ur: NEGATIVE mg/dL
Leukocytes,Ua: NEGATIVE
Nitrite: NEGATIVE
Protein, ur: NEGATIVE mg/dL
Specific Gravity, Urine: 1.013 (ref 1.005–1.030)
pH: 5 (ref 5.0–8.0)

## 2021-09-11 LAB — COMPREHENSIVE METABOLIC PANEL
ALT: 17 U/L (ref 0–44)
AST: 16 U/L (ref 15–41)
Albumin: 4.7 g/dL (ref 3.5–5.0)
Alkaline Phosphatase: 31 U/L — ABNORMAL LOW (ref 38–126)
Anion gap: 13 (ref 5–15)
BUN: 94 mg/dL — ABNORMAL HIGH (ref 8–23)
CO2: 21 mmol/L — ABNORMAL LOW (ref 22–32)
Calcium: 10.2 mg/dL (ref 8.9–10.3)
Chloride: 103 mmol/L (ref 98–111)
Creatinine, Ser: 2.97 mg/dL — ABNORMAL HIGH (ref 0.61–1.24)
GFR, Estimated: 21 mL/min — ABNORMAL LOW (ref >60)
Glucose, Bld: 255 mg/dL — ABNORMAL HIGH (ref 70–99)
Potassium: 4.3 mmol/L (ref 3.5–5.1)
Sodium: 137 mmol/L (ref 135–145)
Total Bilirubin: 0.8 mg/dL (ref 0.3–1.2)
Total Protein: 8.1 g/dL (ref 6.5–8.1)

## 2021-09-11 LAB — CBC
HCT: 44.6 % (ref 39.0–52.0)
Hemoglobin: 14.7 g/dL (ref 13.0–17.0)
MCH: 31.8 pg (ref 26.0–34.0)
MCHC: 33 g/dL (ref 30.0–36.0)
MCV: 96.5 fL (ref 80.0–100.0)
Platelets: 175 10*3/uL (ref 150–400)
RBC: 4.62 MIL/uL (ref 4.22–5.81)
RDW: 13.4 % (ref 11.5–15.5)
WBC: 6.8 10*3/uL (ref 4.0–10.5)
nRBC: 0 % (ref 0.0–0.2)

## 2021-09-11 LAB — MAGNESIUM: Magnesium: 2.4 mg/dL (ref 1.7–2.4)

## 2021-09-11 LAB — GLUCOSE, CAPILLARY
Glucose-Capillary: 220 mg/dL — ABNORMAL HIGH (ref 70–99)
Glucose-Capillary: 252 mg/dL — ABNORMAL HIGH (ref 70–99)
Glucose-Capillary: 240 mg/dL — ABNORMAL HIGH (ref 70–99)
Glucose-Capillary: 212 mg/dL — ABNORMAL HIGH (ref 70–99)
Glucose-Capillary: 162 mg/dL — ABNORMAL HIGH (ref 70–99)

## 2021-09-11 LAB — APTT: aPTT: 24 seconds (ref 24–36)

## 2021-09-11 LAB — HEMOGLOBIN A1C
Hgb A1c MFr Bld: 7.7 % — ABNORMAL HIGH (ref 4.8–5.6)
Mean Plasma Glucose: 174.29 mg/dL

## 2021-09-11 LAB — PHOSPHORUS: Phosphorus: 4.8 mg/dL — ABNORMAL HIGH (ref 2.5–4.6)

## 2021-09-11 MED ORDER — HYDROMORPHONE HCL 1 MG/ML IJ SOLN
0.5000 mg | INTRAMUSCULAR | Status: DC | PRN
  Administered 2021-09-11 – 2021-09-20 (×20): 0.5 mg via INTRAVENOUS
  Filled 2021-09-11 (×21): qty 0.5

## 2021-09-11 MED ORDER — INSULIN GLARGINE-YFGN 100 UNIT/ML ~~LOC~~ SOLN
15.0000 [IU] | Freq: Every day | SUBCUTANEOUS | Status: DC
  Administered 2021-09-11 – 2021-09-18 (×8): 15 [IU] via SUBCUTANEOUS
  Filled 2021-09-11 (×10): qty 0.15

## 2021-09-11 MED ORDER — BISACODYL 10 MG RE SUPP
10.0000 mg | Freq: Every day | RECTAL | Status: DC
  Administered 2021-09-12 – 2021-09-21 (×4): 10 mg via RECTAL
  Filled 2021-09-11 (×11): qty 1

## 2021-09-11 MED ORDER — INSULIN ASPART 100 UNIT/ML IJ SOLN
0.0000 [IU] | INTRAMUSCULAR | Status: DC
  Administered 2021-09-11: 5 [IU] via SUBCUTANEOUS
  Administered 2021-09-11: 8 [IU] via SUBCUTANEOUS
  Administered 2021-09-11: 5 [IU] via SUBCUTANEOUS
  Administered 2021-09-11: 3 [IU] via SUBCUTANEOUS
  Administered 2021-09-11: 5 [IU] via SUBCUTANEOUS
  Administered 2021-09-12 (×5): 3 [IU] via SUBCUTANEOUS
  Administered 2021-09-12 – 2021-09-13 (×6): 2 [IU] via SUBCUTANEOUS
  Administered 2021-09-13: 3 [IU] via SUBCUTANEOUS
  Administered 2021-09-14 (×2): 2 [IU] via SUBCUTANEOUS
  Administered 2021-09-14: 3 [IU] via SUBCUTANEOUS
  Administered 2021-09-14 – 2021-09-15 (×5): 2 [IU] via SUBCUTANEOUS
  Administered 2021-09-15: 3 [IU] via SUBCUTANEOUS
  Administered 2021-09-15: 5 [IU] via SUBCUTANEOUS
  Administered 2021-09-15: 2 [IU] via SUBCUTANEOUS
  Administered 2021-09-15 – 2021-09-16 (×2): 3 [IU] via SUBCUTANEOUS
  Administered 2021-09-16: 2 [IU] via SUBCUTANEOUS
  Administered 2021-09-16: 5 [IU] via SUBCUTANEOUS
  Administered 2021-09-16: 8 [IU] via SUBCUTANEOUS
  Administered 2021-09-17: 3 [IU] via SUBCUTANEOUS
  Administered 2021-09-17: 2 [IU] via SUBCUTANEOUS
  Administered 2021-09-17: 3 [IU] via SUBCUTANEOUS
  Administered 2021-09-18 (×3): 2 [IU] via SUBCUTANEOUS
  Administered 2021-09-18 (×2): 3 [IU] via SUBCUTANEOUS
  Administered 2021-09-19 (×2): 2 [IU] via SUBCUTANEOUS

## 2021-09-11 MED ORDER — HYDRALAZINE HCL 20 MG/ML IJ SOLN: 10.0000 mg | Freq: Four times a day (QID) | INTRAMUSCULAR | Status: DC | PRN

## 2021-09-11 MED ORDER — HYDRALAZINE HCL 20 MG/ML IJ SOLN
10.0000 mg | Freq: Three times a day (TID) | INTRAMUSCULAR | Status: DC | PRN
Start: 2021-09-11 — End: 2021-09-23

## 2021-09-11 MED ORDER — METOPROLOL TARTRATE 5 MG/5ML IV SOLN
2.5000 mg | Freq: Three times a day (TID) | INTRAVENOUS | Status: DC
  Administered 2021-09-11 – 2021-09-12 (×4): 2.5 mg via INTRAVENOUS
  Filled 2021-09-11 (×4): qty 5

## 2021-09-11 NOTE — Consult Note (Signed)
[facility] Consult ? ?Reason for Consult: Small bowel obstruction ?Referring Physician: Dr. Laverta Baltimore ? ?Chief Complaint   ?Abdominal Pain ?  ? ? ?HPI: Kevin [name] is a 76 y.o. male who presents to the hospital with a 1 day history of nausea, vomiting, and abdominal pain.  Patient states that he has had similar symptoms to this in the past, but he was treated outpatient with a clear liquid diet for a couple of weeks, and the pain resolved.  His last bowel movement was yesterday and it was normal.  He believes that the last time he passed flatus was at that time.  His surgical history is significant for lap band placement in 2011 at [facility] and open appendectomy when he was a child.  He has a past medical history significant for coronary artery disease on Plavix and aspirin, hypertension, hyperlipidemia, obstructive sleep apnea, and diabetes.  He last took his Plavix yesterday.  He takes Plavix and aspirin for history of cardiac stent placement.  In the ED, he underwent a CT abdomen and pelvis which demonstrated mid small bowel obstruction with point of transition within the left mid abdomen with adhesions thought to be the primary cause and appropriately positioned lap band.  He has no leukocytosis, but an AKI with Cr of 2.97. ? ?Currently, the patient complains of some abdominal tenderness and nausea.  He denies any episodes of emesis, and states that he is not usually able to throw up secondary to his gastric lap band.  He denies any flatus or bowel movements today.  His NG tube had 500 cc of output overnight, and 100 cc currently in the canister. ? ?Past Medical History:  ?Diagnosis Date  ? Angio-edema   ? Asthma   ? as a child. no problems now.   ? CAD (coronary artery disease)   ? Cellulitis 5/11  ? left leg  ? Diabetes mellitus   ? Diverticulosis   ? Diverticulosis   ? HTN (hypertension)   ? Hyperlipidemia   ? Melanoma ([place]) 10/11  ? left foot  ? Melanoma of foot ([place]) 06/02/2011  ?  Microscopic colitis 10/09/10  ? Colonoscopy dr [name]  ? Obesity   ? OSA on CPAP 01/20/2014  ? Psoriasis   ? S/P colonoscopy 07/03/03  ?  Dr [name]->hyperplastic rectal polyp  ? S/P endoscopy 10/09/10  ? lap band intact, duodenal erosions  ? Seasonal allergies   ? Sleep apnea   ? Urticaria   ? ? ?Past Surgical History:  ?Procedure Laterality Date  ? ABDOMINAL SURGERY    ? ADENOIDECTOMY    ? APPENDECTOMY    ? CARDIAC CATHETERIZATION N/A 02/14/2015  ? Procedure: Left Heart Cath and Coronary Angiography;  Surgeon: Jettie Booze, MD;  Location: [place] [facility];  Service: Cardiovascular;  Laterality: N/A;  ? CARDIAC CATHETERIZATION N/A 02/14/2015  ? Procedure: Coronary Stent Intervention;  Surgeon: Jettie Booze, MD;  Location: [place] [facility];  Service: Cardiovascular;  Laterality: N/A;  ? COLONOSCOPY N/A 10/09/2010  ? normal rectum, long redundant colon, pancolonic diverticula, 56m cecal polyp, diminutive polyp at the appendiceal orifice ablated with tip of hot snare (Biopsies suggestive of microscopic colitis; polyp benign; small bowel ok)  ? CORONARY ANGIOPLASTY  2002/2005/2008  ? CORONARY ANGIOPLASTY WITH STENT PLACEMENT  06/11/2007  ? PTCA & stenting distal CX, PTCA & balloon angioplasty in-stent restenotic mid CX coronary artery stent  ? ESOPHAGOGASTRODUODENOSCOPY N/A 10/09/2010  ? Normal esophagus, lap band present, 2nd  portion duodenum with erosions without frank ulcer  ? KNEE SURGERY    ? left  ? LEFT HEART CATHETERIZATION WITH CORONARY ANGIOGRAM N/A 07/15/2013  ? Procedure: LEFT HEART CATHETERIZATION WITH CORONARY ANGIOGRAM;  Surgeon: Sinclair Grooms, MD;  Location: [facility] C[facility];  Service: Cardiovascular;  Laterality: N/A;  ? NM MYOCAR PERF WALL MOTION  06/24/2011  ? small mild fixed anteroseptal & anteroapical defects.  ? PENILE PROSTHESIS IMPLANT    ? SHOULDER SURGERY    ? left  ? SINOSCOPY    ? TONSILLECTOMY    ? TYMPANOSTOMY TUBE PLACEMENT    ? ? ?Family History  ?Problem Relation Age of Onset   ? Coronary artery disease Mother   ? Diabetes Father   ? Liver cancer Brother 28  ? Celiac disease Other 67  ?     brother's daughter  ? Allergic rhinitis Neg Hx   ? Angioedema Neg Hx   ? Asthma Neg Hx   ? Atopy Neg Hx   ? Eczema Neg Hx   ? Immunodeficiency Neg Hx   ? Urticaria Neg Hx   ? ? ?Social History  ? ?Tobacco Use  ? Smoking status: Former  ?  Packs/day: 3.00  ?  Years: 30.00  ?  Pack years: 90.00  ?  Types: Cigarettes  ?  Start date: 11/18/1964  ?  Quit date: 10/04/1994  ?  Years since quitting: 26.9  ? Smokeless tobacco: Never  ? Tobacco comments:  ?  quit smoking 19 years ago  ?Vaping Use  ? Vaping Use: Never used  ?Substance Use Topics  ? Alcohol use: Yes  ?  Alcohol/week: 0.0 standard drinks  ?  Comment: 3-4 times/year  ? Drug use: No  ? ? ?Medications: I have reviewed the patient's current medications. ? ?Allergies  ?Allergen Reactions  ? Altace [Ramipril] Shortness Of Breath  ? Avapro [Irbesartan] Shortness Of Breath  ? Prednisone Anaphylaxis, Swelling and Other (See Comments)  ?  SWELLING OF THE LIPS  ? Shellfish Allergy Anaphylaxis  ?  Shrimp, selfish  ? Tradjenta [Linagliptin] Shortness Of Breath  ? Cephalexin Hives and Swelling  ? Clindamycin Hcl Hives and Swelling  ? Codeine Nausea And Vomiting and Other (See Comments)  ?  AFFECTS HEART  ? Contrast Media [Iodinated Contrast Media]   ?  Can be administered if Benadryl is administered prior to prevent reaction   ? Fenofibrate Other (See Comments)  ?  "My skin started peeling off"  ? Glycotrol [Support-500] Other (See Comments)  ?  unknown  ? Indomethacin Other (See Comments)  ?  UNKNOWN REACTION  ? Minocycline Hcl Hives and Swelling  ? Morphine And Related Nausea And Vomiting  ?  PROJECTILE VOMITING  ? Sulfa Drugs Cross Reactors Hives and Swelling  ? Tetracyclines & Related   ? Tree Extract Itching  ? ? ? ?ROS:  ?Constitutional: negative for chills, fatigue, and fevers ?Respiratory: negative for cough, wheezing, and shortness of  breath ?Cardiovascular: negative for chest pain and palpitations ?Gastrointestinal: positive for abdominal pain and nausea, negative for vomiting ?Musculoskeletal:negative for back pain ? ?Blood pressure (!) 150/86, pulse (!) 105, temperature 98.4 ?F (36.9 ?C), temperature source Oral, resp. rate 18, height 5' 9.5" (1.765 m), weight 116.7 kg, SpO2 95 %. ?Physical Exam ?Vitals reviewed.  ?Constitutional:   ?   Appearance: He is well-developed.  ?HENT:  ?   Head: Normocephalic and atraumatic.  ?   Nose:  ?   Comments: NG tube in  place ?Cardiovascular:  ?   Rate and Rhythm: Tachycardia present.  ?Pulmonary:  ?   Effort: Pulmonary effort is normal.  ?Abdominal:  ?   Comments: Abdomen soft, moderate distention, no percussion tenderness, minimal tenderness to palpation in the right side of the abdomen, no rigidity, guarding, rebound tenderness; lap band reservoir noted in the subcutaneous tissue in the epigastrium  ?Skin: ?   General: Skin is warm and dry.  ?Neurological:  ?   General: No focal deficit present.  ?   Mental Status: He is alert and oriented to person, place, and time.  ?Psychiatric:     ?   Mood and Affect: Mood normal.     ?   Behavior: Behavior normal.  ? ? ?Results: ?Results for orders placed or performed during the hospital encounter of 09/10/21 (from the past 48 hour(s))  ?CBC with Differential     Status: None  ? Collection Time: 09/10/21  5:48 PM  ?Result Value Ref Range  ? WBC 8.0 4.0 - 10.5 K/uL  ? RBC 4.36 4.22 - 5.81 MIL/uL  ? Hemoglobin 14.0 13.0 - 17.0 g/dL  ? HCT 41.5 39.0 - 52.0 %  ? MCV 95.2 80.0 - 100.0 fL  ? MCH 32.1 26.0 - 34.0 pg  ? MCHC 33.7 30.0 - 36.0 g/dL  ? RDW 13.5 11.5 - 15.5 %  ? Platelets 192 150 - 400 K/uL  ? nRBC 0.0 0.0 - 0.2 %  ? Neutrophils Relative % 75 %  ? Neutro Abs 6.2 1.7 - 7.7 K/uL  ? Lymphocytes Relative 13 %  ? Lymphs Abs 1.1 0.7 - 4.0 K/uL  ? Monocytes Relative 8 %  ? Monocytes Absolute 0.6 0.1 - 1.0 K/uL  ? Eosinophils Relative 2 %  ? Eosinophils Absolute 0.1 0.0  - 0.5 K/uL  ? Basophils Relative 1 %  ? Basophils Absolute 0.0 0.0 - 0.1 K/uL  ? Immature Granulocytes 1 %  ? Abs Immature Granulocytes 0.04 0.00 - 0.07 K/uL  ?  Comment: Performed at [facility], [address], [place]

## 2021-09-11 NOTE — Progress Notes (Signed)
Inpatient Diabetes Program Recommendations ? ?AACE/ADA: New Consensus Statement on Inpatient Glycemic Control (2015) ? ?Target Ranges:  Prepandial:   less than 140 mg/dL ?     Peak postprandial:   less than 180 mg/dL (1-2 hours) ?     Critically ill patients:  140 - 180 mg/dL  ? ?Lab Results  ?Component Value Date  ? GLUCAP 252 (H) 09/11/2021  ? HGBA1C 7.7 (H) 09/11/2021  ? ? Latest Reference Range & Units 09/11/21 04:44  ?BUN 8 - 23 mg/dL 94 (H)  ?Creatinine 0.61 - 1.24 mg/dL 2.97 (H)  ?(H): Data is abnormally high ? ?Review of Glycemic Control ? Latest Reference Range & Units 09/11/21 04:12 09/11/21 07:41  ?Glucose-Capillary 70 - 99 mg/dL 220 (H) 252 (H)  ?(H): Data is abnormally high ? ?Diabetes history: DM2 ?Outpatient Diabetes medications: Glucotrol 5 mg qd, Januvia 100 mg qd ?Current orders for Inpatient glycemic control: Novolog 0-15 q 4 hrs. correction ?  ?Inpatient Diabetes Program Recommendations:   ?While DM oral medication on hold: ?-Add Levemir 16 units qd (0.15 units/kg x 116.7 kg= 17.5 units) ?Secure chat sent to Dr. Dyann Kief. ? ?Thank you, ?Nani Gasser Ory Elting, RN, MSN, CDE  ?Diabetes Coordinator ?Inpatient Glycemic Control Team ?Team Pager [phone] (8am-5pm) ?09/11/2021 9:15 AM ? ? ? ?

## 2021-09-11 NOTE — Assessment & Plan Note (Addendum)
-  Reporting experiencing once again severe abdominal pain with associated nausea and dry heaving.  5/16 CT abd--continued small bowel dilatation with transition zone seen in the pelvis;  noted twisting of mesenteric structures in this area, and the possibility of closed loop obstruction secondary to malrotation or even internal hernia cannot be excluded as the cause of bowel obstruction -5/19--: Exploratory laparotomy, reduction of volvulus, lysis of adhesions, removal of gastric band  5/20--full liquid diet, increase activity 5/21--carb modified diet which patient tolerated

## 2021-09-11 NOTE — Assessment & Plan Note (Addendum)
-  Blood pressure controlled but gradually climbing back up at time of d/c -Continue as needed hydralazine -Continue IV metoprolol until tolerating po>>now transition to po 5/21 Restart valsartan

## 2021-09-11 NOTE — Assessment & Plan Note (Addendum)
-  In the setting of SBO/volvulus and internal hernia ?-Maintain adequate hydration; NG tube to be replaced.  As needed antiemetics and analgesics will be provided. ?-Follow general surgery recommendation. ?

## 2021-09-11 NOTE — Assessment & Plan Note (Addendum)
-  Body mass index is 37.45 kg/m. -Patient with prior history of lap band placement in 2011.

## 2021-09-11 NOTE — Progress Notes (Signed)
?Progress Note ? ? ?Patient: Kevin [name] KGY:[id number] DOB: Apr 02, 1946 DOA: 09/10/2021     1 ?DOS: the patient was seen and examined on 09/11/2021 ?  ?Brief hospital course: ?As per H&P written by Dr. Josephine Cables on 09/10/2021 ?Kevin [name] is a 76 y.o. male with medical history significant of CAD s/p PCI, hypertension, hyperlipidemia, type 2 diabetes with who presents to the emergency department due to abdominal pain and nausea which started yesterday.  Patient complained of sudden onset of abdominal pain which was associated with nausea and dry heaving.  He complained of abdominal distention since onset of symptoms, stated to go to the ED for further evaluation and management.  Of note, patient had a lap band done in [facility] in 2011.  He denies chest pain, shortness of breath, fever, chills. ?  ?ED Course:  ?In the emergency department, he was hemodynamically stable.  Work-up in the ED showed normal CBC. BMP showed BUN/creatinine 95/3.42 (baseline creatinine at 1.1-1.6), CBG 279, lipase 38. ?CT abdomen and pelvis without contrast showed Mid small bowel obstruction with point of transition within the left mid abdomen. Underlying adhesions suspected. Superimposed mildly incomplete duodenal rotation. No free intraperitoneal gas or fluid identified. ?He was treated with IV fentanyl and Zofran.  General surgery was consulted and recommended admitting patient with plan to see patient in the morning per ED physician.  Hospitalist was asked to admit patient for further evaluation and management ? ?Assessment and Plan: ?* Small bowel obstruction (Kevin [name]) ?-Reporting some nausea but no active vomiting appreciated ?-NG tube in place ?-Will follow recommendations by general surgery ?-Continue conservative management currently ?-Follow electrolytes and replete as needed ?-Medications through IV route as appropriate. ?-Continue as needed antiemetics, analgesics and adequate fluid resuscitation. ? ?Type 2 diabetes mellitus with  hyperglycemia (Kevin [name]) ?-With nephropathy complication ?-Currently n.p.o. ?-Continue sliding scale insulin and low-dose long-acting ?-Hold oral hypoglycemic agents while inpatient. ?-Follow CBGs and adjust hypoglycemic regimen as needed. ? ?AKI (acute kidney injury) (Kevin [name]) ?- Patient with underlying history of chronic kidney disease stage IIIa due to diabetes ?-baseline creatinine of 1.1-1.6 ?-In the setting of prerenal azotemia continue use of nephrotoxic agents patient has presented with a peak creatinine of 3.42 and a BUN of 95 ?-Continue holding nephrotoxic agents, continue adequate hydration and follow renal function trend ?-Avoid hypotension and the use of IV contrast ? ?Obesity (BMI 30-39.9) ?-Body mass index is 37.45 kg/m?. ?-Low calorie diet, portion control and increase physical activity discussed with patient. ?-Patient with prior history of lap band placement in 2011. ? ?Abdominal pain ?-In the setting of SBO ?-Continue as needed analgesia ?-Continue the use of Pepcid. ? ?Mixed hyperlipidemia ?-Planning to resume statins when able to tolerate by mouth ?-Patient is currently NPO. ?-Heart healthy diet and lifestyle modifications discussed with patient. ? ?Essential hypertension ?-Blood pressure stable/rising ?-Continue as needed hydralazine ?-Starting low-dose 3 times a day IV metoprolol. ?-Follow vital signs. ? ?Coronary artery disease, hx LAD and LCX stenting in 2001 & 2004, last cath 2009 with patent stents. ?-No chest pain currently ?-IV metoprolol will be provided. ?-Holding aspirin and Plavix while NPO. ? ? ? ? ? ?Subjective:  ?Afebrile, complaining of nausea and is still ongoing abdominal pain.  NG tube in place.  No fever, no chest pain, no shortness of breath. ? ?Physical Exam: ?Vitals:  ? 09/10/21 2300 09/10/21 2320 09/10/21 2327 09/11/21 0424  ?BP: 134/73  (!) 165/78 135/85  ?Pulse: 87  87 (!) 110  ?Resp: '16  17 17  '$ ?  Temp:   98.5 ?F (36.9 ?C) 99.2 ?F (37.3 ?C)  ?SpO2: 94% 96% 96% 92%  ?Weight:    116.7 kg   ?Height:   5' 9.5" (1.765 m)   ? ?General exam: Alert, awake, oriented x 3; complaining of intermittent nausea and abdominal pain.  NG tube in place. ?Respiratory system: Clear to auscultation. Respiratory effort normal.  No using accessory muscles.  Good saturation on room air. ?Cardiovascular system: RRR, no rubs or gallops; no JVD. ?Gastrointestinal system: Abdomen is obese, soft and without guarding; complaining of tenderness to palpation mid abdominal region.  Decreased to absent bowel sounds. ?Central nervous system: Alert and oriented. No focal neurological deficits. ?Extremities: No cyanosis or clubbing; trace to 1+ edema appreciated bilaterally.  (According to patient unchanged from baseline). ?Skin: No petechiae. ?Psychiatry: Judgement and insight appear normal. Mood & affect appropriate.  ? ?Data Reviewed: ?CBC demonstrating WBCs of 6.8, hemoglobin 14.7 and platelet count 175 K ?A1c 7.7 ?Phosphorus 4.8; magnesium 2.4 ?Comprehensive metabolic panel demonstrating sodium 137, potassium 4.3, bicarb 21, BUN 94, creatinine 2.97 and stable LFT's ? ? ?Family Communication: No family at bedside. ? ?Disposition: ?Status is: Inpatient ?Remains inpatient appropriate because: Continue management of SBO and acute on chronic renal failure.. ? ? Planned Discharge Destination: Home ? ? ?Author: ?Barton Dubois, MD ?09/11/2021 7:52 AM ? ?For on call review [email or web address].  ?

## 2021-09-11 NOTE — Assessment & Plan Note (Addendum)
-  No chest pain currently -Continue IV metoprolol>>convert to po metoprolol  -Aspirin and Plavix on hold until able to tolerate p.o.'s. and for surgery -5/21--restart ASA and plavix

## 2021-09-11 NOTE — Progress Notes (Signed)
Patient alert and verbal. Obtained urine sample and sent to lab. NG tube in place to right nare, output via tube was 200 ml this shift. NG tube connected to low intermittent suction and continuing to have output at this time. Patient complained of nausea and pain this shift, patient given PRNs, see MAR. Patient having dry heaves and small emesis after given pain medication, MD Dyann Kief made aware. Patient ambulated from bed to chair.  ?

## 2021-09-11 NOTE — Assessment & Plan Note (Addendum)
-  baseline creatinine of 1.1-1.4 -serum creatinine peaked 3.42 -Continue minimizing nephrotoxic agents -Continue to maintain adequate hydration. -In the setting of prerenal azotemia; volume depletion -improved with IVF -serum creatinine 1.22 at time of d/c

## 2021-09-11 NOTE — TOC Progression Note (Signed)
?  Transition of Care (TOC) Screening Note ? ? ?Patient Details  ?Name: Kevin [name] ?Date of Birth: August 05, 1945 ? ? ?Transition of Care (TOC) CM/SW Contact:    ?Shade Flood, LCSW ?Phone Number: ?09/11/2021, 9:00 AM ? ? ? ?Transition of Care Department [place]) has reviewed patient and no TOC needs have been identified at this time. We will continue to monitor patient advancement through interdisciplinary progression rounds. If new patient transition needs arise, please place a TOC consult. ? ? ?

## 2021-09-11 NOTE — Assessment & Plan Note (Addendum)
-  With nephropathy complication (chronic kidney disease stage IIIa). -Continue sliding scale insulin  -Continue to hold oral hypoglycemic agents while inpatient. -Initiation of his steroids could potentially increase CBGs; will follow trend and adjust management as required. 09/11/21 A1C--7.7 --discontinue semglee as CBGs trending down --restart glipizide and januvia at time of dc

## 2021-09-11 NOTE — Assessment & Plan Note (Addendum)
-  Continue statins when able to tolerate p.o.'s again. ?-Heart healthy diet discussed with patient. ?

## 2021-09-11 NOTE — Progress Notes (Signed)
Patient refused to take his home clothes off to do thorough skin assessment. Assessed what areas I could see.  ?

## 2021-09-12 ENCOUNTER — Inpatient Hospital Stay (HOSPITAL_COMMUNITY): Payer: Medicare PPO

## 2021-09-12 DIAGNOSIS — I10 Essential (primary) hypertension: Secondary | ICD-10-CM | POA: Diagnosis not present

## 2021-09-12 DIAGNOSIS — I2510 Atherosclerotic heart disease of native coronary artery without angina pectoris: Secondary | ICD-10-CM | POA: Diagnosis not present

## 2021-09-12 DIAGNOSIS — N179 Acute kidney failure, unspecified: Secondary | ICD-10-CM | POA: Diagnosis not present

## 2021-09-12 DIAGNOSIS — K56609 Unspecified intestinal obstruction, unspecified as to partial versus complete obstruction: Secondary | ICD-10-CM | POA: Diagnosis not present

## 2021-09-12 LAB — GLUCOSE, CAPILLARY
Glucose-Capillary: 121 mg/dL — ABNORMAL HIGH (ref 70–99)
Glucose-Capillary: 148 mg/dL — ABNORMAL HIGH (ref 70–99)
Glucose-Capillary: 153 mg/dL — ABNORMAL HIGH (ref 70–99)
Glucose-Capillary: 156 mg/dL — ABNORMAL HIGH (ref 70–99)
Glucose-Capillary: 157 mg/dL — ABNORMAL HIGH (ref 70–99)
Glucose-Capillary: 163 mg/dL — ABNORMAL HIGH (ref 70–99)
Glucose-Capillary: 180 mg/dL — ABNORMAL HIGH (ref 70–99)

## 2021-09-12 LAB — CBC
HCT: 41.8 % (ref 39.0–52.0)
Hemoglobin: 13.5 g/dL (ref 13.0–17.0)
MCH: 31.5 pg (ref 26.0–34.0)
MCHC: 32.3 g/dL (ref 30.0–36.0)
MCV: 97.4 fL (ref 80.0–100.0)
Platelets: 159 10*3/uL (ref 150–400)
RBC: 4.29 MIL/uL (ref 4.22–5.81)
RDW: 13.9 % (ref 11.5–15.5)
WBC: 4.8 10*3/uL (ref 4.0–10.5)
nRBC: 0 % (ref 0.0–0.2)

## 2021-09-12 LAB — BASIC METABOLIC PANEL
Anion gap: 9 (ref 5–15)
BUN: 87 mg/dL — ABNORMAL HIGH (ref 8–23)
CO2: 25 mmol/L (ref 22–32)
Calcium: 9.1 mg/dL (ref 8.9–10.3)
Chloride: 108 mmol/L (ref 98–111)
Creatinine, Ser: 2.05 mg/dL — ABNORMAL HIGH (ref 0.61–1.24)
GFR, Estimated: 33 mL/min — ABNORMAL LOW (ref >60)
Glucose, Bld: 179 mg/dL — ABNORMAL HIGH (ref 70–99)
Potassium: 4.2 mmol/L (ref 3.5–5.1)
Sodium: 142 mmol/L (ref 135–145)

## 2021-09-12 LAB — MAGNESIUM: Magnesium: 2.3 mg/dL (ref 1.7–2.4)

## 2021-09-12 MED ORDER — DIPHENHYDRAMINE HCL 50 MG/ML IJ SOLN
25.0000 mg | Freq: Once | INTRAMUSCULAR | Status: AC
  Administered 2021-09-12: 25 mg via INTRAVENOUS
  Filled 2021-09-12: qty 1

## 2021-09-12 MED ORDER — DIATRIZOATE MEGLUMINE & SODIUM 66-10 % PO SOLN
90.0000 mL | Freq: Once | ORAL | Status: AC
  Administered 2021-09-12: 90 mL via NASOGASTRIC

## 2021-09-12 MED ORDER — DIATRIZOATE MEGLUMINE & SODIUM 66-10 % PO SOLN
ORAL | Status: AC
  Administered 2021-09-12: 30 mL
  Filled 2021-09-12: qty 90

## 2021-09-12 MED ORDER — METOPROLOL TARTRATE 5 MG/5ML IV SOLN
5.0000 mg | Freq: Three times a day (TID) | INTRAVENOUS | Status: DC
  Administered 2021-09-12 – 2021-09-13 (×4): 5 mg via INTRAVENOUS
  Filled 2021-09-12 (×4): qty 5

## 2021-09-12 NOTE — Progress Notes (Signed)
Patient had large green/brown bowel movement. BM mostly liquid with small solid pieces noted. Patient stated nausea improved after bowel movement.  ?

## 2021-09-12 NOTE — Progress Notes (Signed)
?Progress Note ? ? ?Patient: Kevin [name] IHK:[id number] DOB: [birth year] DOA: 09/10/2021     2 ?DOS: the patient was seen and examined on 09/12/2021 ?  ?Brief hospital course: ?As per H&P written by Dr. Josephine Cables on 09/10/2021 ?Kevin [name] is a 76 y.o. male with medical history significant of CAD s/p PCI, hypertension, hyperlipidemia, type 2 diabetes with who presents to the emergency department due to abdominal pain and nausea which started yesterday.  Patient complained of sudden onset of abdominal pain which was associated with nausea and dry heaving.  He complained of abdominal distention since onset of symptoms, stated to go to the ED for further evaluation and management.  Of note, patient had a lap band done in [facility] in 2011.  He denies chest pain, shortness of breath, fever, chills. ?  ?ED Course:  ?In the emergency department, he was hemodynamically stable.  Work-up in the ED showed normal CBC. BMP showed BUN/creatinine 95/3.42 (baseline creatinine at 1.1-1.6), CBG 279, lipase 38. ?CT abdomen and pelvis without contrast showed Mid small bowel obstruction with point of transition within the left mid abdomen. Underlying adhesions suspected. Superimposed mildly incomplete duodenal rotation. No free intraperitoneal gas or fluid identified. ?He was treated with IV fentanyl and Zofran.  General surgery was consulted and recommended admitting patient with plan to see patient in the morning per ED physician.  Hospitalist was asked to admit patient for further evaluation and management ? ?Assessment and Plan: ?* Small bowel obstruction ([place]) ?-Reporting feeling much better today and currently without nausea or vomiting. ?-NG tube in place; with decreasing output. ?-Will continue to follow recommendations by general surgery ?-Planning for a small bowel obstruction protocol images to assess resolution and advance diet if appropriate. ?-Continue to maintain adequate hydration; follow electrolytes and replete as needed  (goal is for potassium above 4 and magnesium around 2). ?-Medications through IV route as appropriate. ?-Continue as needed antiemetics, analgesics and follow clinical response.  Patient has been advised to increase ambulation. ? ?Type 2 diabetes mellitus with hyperglycemia ([place]) ?-With nephropathy complication ?-Currently n.p.o. ?-Continue sliding scale insulin and low-dose long-acting ?-Hold oral hypoglycemic agents while inpatient. ?-Continue to follow CBGs and adjust hypoglycemic regimen as needed. ? ?AKI (acute kidney injury) ([place]) ?-Patient with underlying history of chronic kidney disease stage IIIa due to diabetes ?-baseline creatinine of 1.1-1.6 ?-In the setting of prerenal azotemia continue use of nephrotoxic agents patient has presented with a peak creatinine of 3.42 and a BUN of 95 at time of admission. ?-Continue holding nephrotoxic agents, continue to maintain adequate hydration and follow renal function trend ?-Avoid hypotension and the use of IV contrast. ?-Follow renal function trend.  Creatinine down to 2.0 currently. ? ?Obesity (BMI 30-39.9) ?-Body mass index is 37.45 kg/m?. ?-Low calorie diet, portion control and increase physical activity discussed with patient. ?-Patient with prior history of lap band placement in 2011. ? ?Abdominal pain ?-In the setting of SBO ?-Continue as needed analgesia ?-Continue the use of Pepcid. ? ?Mixed hyperlipidemia ?-Planning to resume statins when able to tolerate by mouth ?-Patient is currently NPO. ?-Heart healthy diet and lifestyle modifications discussed with patient. ? ?Essential hypertension ?-Blood pressure stable/rising ?-Continue as needed hydralazine ?-Continue low-dose 3 times a day IV metoprolol. ?-Follow vital signs. ? ?Coronary artery disease, hx LAD and LCX stenting in 2001 & 2004, last cath 2009 with patent stents. ?-No chest pain currently ?-IV metoprolol will be provided. ?-Continue holding aspirin and Plavix while NPO. ? ? ? ? ? ?  Subjective:   ?NG tube in place with demonstration of significant decrease in NG tube output.  Patient is afebrile, reports no nausea, no vomiting, no chest pain or shortness of breath.  2 bowel movements and was able to pass gas overnight. ? ?Physical Exam: ?Vitals:  ? 09/11/21 2032 09/12/21 0344 09/12/21 0436 09/12/21 1409  ?BP: 137/74 (!) 147/78  125/74  ?Pulse: (!) 103 (!) 110 (!) 110 97  ?Resp: '18 18  17  '$ ?Temp: 97.6 ?F (36.4 ?C) 98.2 ?F (36.8 ?C)  98.2 ?F (36.8 ?C)  ?TempSrc: Oral   Oral  ?SpO2: 96% 97%  96%  ?Weight:      ?Height:      ? ?General exam: Alert, awake, oriented x 3; reports feeling much better; no nausea or vomiting.  Patient had 2 bowel movement and expressed passing some gas Overnight. ?Respiratory system: Clear to auscultation. Respiratory effort normal.  No using accessory muscles.  Good saturation on room air. ?Cardiovascular system:RRR. No rubs or gallops; no JVD. ?Gastrointestinal system: Abdomen is obese, soft and without guarding; patient reports son mild/back tenderness on deep palpation; no guarding.  Positive bowel sounds appreciated.  ?Central nervous system: Alert and oriented. No focal neurological deficits. ?Extremities: No cyanosis or clubbing; trace edema appreciated bilaterally. ?Skin: No petechiae. ?Psychiatry: Judgement and insight appear normal. Mood & affect appropriate.  ? ? ?Data Reviewed: ?Basic metabolic panel demonstrating sodium 142, potassium 4.2, BUN 87 and creatinine 2.05; patient's blood sugar in the 170-180 range ?CBC demonstrating WBCs of 4.8, hemoglobin 13.5 and platelet count 159K ?Magnesium 2.3 ? ?Family Communication: No family at bedside. ? ?Disposition: ?Status is: Inpatient ?Remains inpatient appropriate because: Continue management of SBO and acute on chronic renal failure.. ? ? Planned Discharge Destination: Home ? ? ?Author: ?Barton Dubois, MD ?09/12/2021 4:01 PM ? ?For on call review [email or web address].  ?

## 2021-09-12 NOTE — Progress Notes (Signed)
Patient given contrast for 8 hour scan to be done, NG tube clamped at 0946. Patient tolerated well ?

## 2021-09-12 NOTE — Progress Notes (Signed)
[facility] Progress Note ? ?   ?Subjective: ?Patient seen and evaluated. He reports feeling better this morning. He had two bowel movements and has been passing flatus. He reports that his nausea and abdominal pain were both improved by the bowel movements. The bowel movements were mostly liquid, with some small solid pieces. ? ?He says that in the past, he received benadryl prior to IV contrasted studies and tolerated them well without issue. His reaction to iodinated contrast was vomiting. ? ?Objective: ?Vital signs in last 24 hours: ?Temp:  [97.6 ?F (36.4 ?C)-98.2 ?F (36.8 ?C)] 98.2 ?F (36.8 ?C) (05/11 0344) ?Pulse Rate:  [89-110] 110 (05/11 0436) ?Resp:  [18-19] 18 (05/11 0344) ?BP: (137-151)/(74-87) 147/78 (05/11 0344) ?SpO2:  [96 %-97 %] 97 % (05/11 0344) ?Last BM Date : 09/11/21 ? ?Intake/Output from previous day: ?05/10 0701 - 05/11 0700 ?In: 3146.6 [I.V.:3146.6] ?Out: 5732 [Urine:1000; Emesis/NG output:450] ?Intake/Output this shift: ?No intake/output data recorded. ? ?Physical Exam ?Constitutional: Awake, pleasant. Sitting in chair. No acute distress. ?Abdomen: Soft, non-distended. No tenderness to light palpation. Minimal tenderness to deep palpation in right periumbilical region. ?MSK: Moves all extremities equally. ?Neuro: AAOx3. No focal deficits on exam. ?Psych: Normal mood and affect. ? ?Lab Results:  ?Recent Labs  ?  09/11/21 ?0444 09/12/21 ?0403  ?WBC 6.8 4.8  ?HGB 14.7 13.5  ?HCT 44.6 41.8  ?PLT 175 159  ? ?BMET ?Recent Labs  ?  09/11/21 ?0444 09/12/21 ?0403  ?NA 137 142  ?K 4.3 4.2  ?CL 103 108  ?CO2 21* 25  ?GLUCOSE 255* 179*  ?BUN 94* 87*  ?CREATININE 2.97* 2.05*  ?CALCIUM 10.2 9.1  ? ?Mg 2.3 ? ?Studies/Results: ?CT ABDOMEN PELVIS WO CONTRAST ? ?Result Date: 09/10/2021 ?CLINICAL DATA:  Abdominal pain, vomiting, bowel obstruction EXAM: CT ABDOMEN AND PELVIS WITHOUT CONTRAST TECHNIQUE: Multidetector CT imaging of the abdomen and pelvis was performed following the standard  protocol without IV contrast. RADIATION DOSE REDUCTION: This exam was performed according to the departmental dose-optimization program which includes automated exposure control, adjustment of the mA and/or kV according to patient size and/or use of iterative reconstruction technique. COMPARISON:  03/05/2020 FINDINGS: Lower chest: The visualized lung bases are clear. At least moderate coronary artery calcification. Global cardiac size within normal limits. Hepatobiliary: No focal liver abnormality is seen. No gallstones, gallbladder wall thickening, or biliary dilatation. Pancreas: Unremarkable. No pancreatic ductal dilatation or surrounding inflammatory changes. Spleen: 12 mm rim calcified cystic lesion within the a upper pole of the spleen is stable since prior examination and is safely considered benign, possibly the sequela of remote trauma or inflammation. Spleen is otherwise unremarkable. Adrenals/Urinary Tract: Adrenal glands are unremarkable. Kidneys are normal, without renal calculi, focal lesion, or hydronephrosis. Bladder is unremarkable. Stomach/Bowel: There are multiple fluid-filled dilated loops of small bowel seen proximally with a point of transition within the mid to distal small bowel within the left mid abdomen at axial image # 49/2 and coronal image # 50/5 in keeping with a mid small bowel obstruction. A discrete obstructing lesion is not identified and an underlying adhesion is favored. There is i mildly ncomplete duodenal rotation with the ligament of Treitz seen to the left of midline, but not elevating to the level of the gastroduodenal junction, best seen on coronal image # 75/5. The distal small bowel appears decompressed as does the colon. Moderate sigmoid colonic diverticulosis is noted. The appendix is not clearly identified, however, no secondary signs of appendicitis are seen within the right lower  quadrant. Gastric lap band device is in place and appears intact and appropriately  position. No free intraperitoneal gas or fluid. Vascular/Lymphatic: Aortic atherosclerosis. No enlarged abdominal or pelvic lymph nodes. Reproductive: Mild prostatic enlargement. Decompressed penile prosthetic reservoir noted within the pre vesicular space. Other: No abdominal wall hernia. Musculoskeletal: No acute bone abnormality. No lytic or blastic bone lesion. Osseous structures are age-appropriate. IMPRESSION: Mid small bowel obstruction with point of transition within the left mid abdomen. Underlying adhesions suspected. Superimposed mildly incomplete duodenal rotation. No free intraperitoneal gas or fluid identified. Status post gastric lap band placement. Device appears intact and normally position. Moderate sigmoid diverticulosis. Mild prostatic hypertrophy. Aortic Atherosclerosis (ICD10-I70.0). Electronically Signed   By: Fidela Salisbury M.D.   On: 09/10/2021 21:20  ? ?DG Abd 1 View ? ?Result Date: 09/11/2021 ?CLINICAL DATA:  NG tube placement. EXAM: ABDOMEN - 1 VIEW COMPARISON:  10/08/2010 FINDINGS: 1305 hours. NG tube tip is in the mid stomach. Visualized upper abdomen reveals gaseous bowel distension. Lap band overlies the medial left upper quadrant. IMPRESSION: NG tube tip is in the mid stomach. Lap band noted in the region of the esophagogastric junction. Electronically Signed   By: Misty Stanley M.D.   On: 09/11/2021 13:13   ? ?Assessment/Plan: ? ?Kevin [name] is a 76 y.o. male who was admitted for small bowel obstruction.  ? ?Neuro ?- Dilaudid 0.'5mg'$  IV q4h prn for moderate-severe pain ?Cardiac ?- Lopressor '5mg'$  IV q8h ?GI ?- Diet NPO ?- NG tube ?- SSI moderate ?- CBG monitoring q4h ?- Semglee 15u [place] daily ?- Dulcolax suppository '10mg'$  daily ?- Zofran '4mg'$  IV q6h prn for nausea ?- Small Bowel Obstruction protocol. Will start and advance diet based on results of study. ?- Patient had two BM and was passing flatus today. Will f/u results of SBO protocol and advance diet if possible.  ?GU ?- LR 165m/hr  IV ?- BMP ?Heme ?- CBC ?- Continue to hold Aspirin and Plavix ?Prophylaxis ?- SCDs ? ? ? LOS: 2 days  ? ? ?ENelva Nay Medical Student ?09/12/2021  ?

## 2021-09-12 NOTE — Progress Notes (Signed)
Received order to remove NG tube. NG tube removed. Patient tolerated well.  ?

## 2021-09-13 ENCOUNTER — Encounter (HOSPITAL_COMMUNITY)
Admission: EM | Disposition: A | Discharge status: Skilled Nursing Facility | Payer: Self-pay | Source: Home / Self Care | Attending: Internal Medicine

## 2021-09-13 DIAGNOSIS — K56609 Unspecified intestinal obstruction, unspecified as to partial versus complete obstruction: Secondary | ICD-10-CM | POA: Diagnosis not present

## 2021-09-13 DIAGNOSIS — I2510 Atherosclerotic heart disease of native coronary artery without angina pectoris: Secondary | ICD-10-CM | POA: Diagnosis not present

## 2021-09-13 DIAGNOSIS — I10 Essential (primary) hypertension: Secondary | ICD-10-CM | POA: Diagnosis not present

## 2021-09-13 DIAGNOSIS — N179 Acute kidney failure, unspecified: Secondary | ICD-10-CM | POA: Diagnosis not present

## 2021-09-13 LAB — BASIC METABOLIC PANEL
Anion gap: 9 (ref 5–15)
BUN: 67 mg/dL — ABNORMAL HIGH (ref 8–23)
CO2: 27 mmol/L (ref 22–32)
Calcium: 9 mg/dL (ref 8.9–10.3)
Chloride: 109 mmol/L (ref 98–111)
Creatinine, Ser: 1.54 mg/dL — ABNORMAL HIGH (ref 0.61–1.24)
GFR, Estimated: 47 mL/min — ABNORMAL LOW (ref >60)
Glucose, Bld: 130 mg/dL — ABNORMAL HIGH (ref 70–99)
Potassium: 3.5 mmol/L (ref 3.5–5.1)
Sodium: 145 mmol/L (ref 135–145)

## 2021-09-13 LAB — CBC
HCT: 39.3 % (ref 39.0–52.0)
Hemoglobin: 12.5 g/dL — ABNORMAL LOW (ref 13.0–17.0)
MCH: 31.6 pg (ref 26.0–34.0)
MCHC: 31.8 g/dL (ref 30.0–36.0)
MCV: 99.5 fL (ref 80.0–100.0)
Platelets: 161 10*3/uL (ref 150–400)
RBC: 3.95 MIL/uL — ABNORMAL LOW (ref 4.22–5.81)
RDW: 13.9 % (ref 11.5–15.5)
WBC: 5.8 10*3/uL (ref 4.0–10.5)
nRBC: 0 % (ref 0.0–0.2)

## 2021-09-13 LAB — GLUCOSE, CAPILLARY
Glucose-Capillary: 130 mg/dL — ABNORMAL HIGH (ref 70–99)
Glucose-Capillary: 138 mg/dL — ABNORMAL HIGH (ref 70–99)
Glucose-Capillary: 138 mg/dL — ABNORMAL HIGH (ref 70–99)
Glucose-Capillary: 143 mg/dL — ABNORMAL HIGH (ref 70–99)
Glucose-Capillary: 146 mg/dL — ABNORMAL HIGH (ref 70–99)
Glucose-Capillary: 169 mg/dL — ABNORMAL HIGH (ref 70–99)

## 2021-09-13 SURGERY — LAPAROTOMY, EXPLORATORY
Anesthesia: General

## 2021-09-13 MED ORDER — LEVOTHYROXINE SODIUM 50 MCG PO TABS
50.0000 ug | ORAL_TABLET | Freq: Every day | ORAL | Status: DC
  Administered 2021-09-14 – 2021-09-17 (×4): 50 ug via ORAL
  Filled 2021-09-13 (×4): qty 1

## 2021-09-13 MED ORDER — METOPROLOL SUCCINATE ER 50 MG PO TB24
100.0000 mg | ORAL_TABLET | Freq: Every day | ORAL | Status: DC
  Administered 2021-09-13 – 2021-09-17 (×5): 100 mg via ORAL
  Filled 2021-09-13 (×5): qty 2

## 2021-09-13 MED ORDER — PRAVASTATIN SODIUM 40 MG PO TABS
40.0000 mg | ORAL_TABLET | Freq: Every evening | ORAL | Status: DC
  Administered 2021-09-13 – 2021-09-16 (×4): 40 mg via ORAL
  Filled 2021-09-13 (×5): qty 1

## 2021-09-13 MED ORDER — TAMSULOSIN HCL 0.4 MG PO CAPS
0.4000 mg | ORAL_CAPSULE | Freq: Every day | ORAL | Status: DC
  Administered 2021-09-13 – 2021-09-17 (×5): 0.4 mg via ORAL
  Filled 2021-09-13 (×5): qty 1

## 2021-09-13 MED ORDER — TRAZODONE HCL 50 MG PO TABS
50.0000 mg | ORAL_TABLET | Freq: Every day | ORAL | Status: AC
  Administered 2021-09-13: 50 mg via ORAL
  Filled 2021-09-13: qty 1

## 2021-09-13 MED ORDER — FAMOTIDINE 20 MG PO TABS
20.0000 mg | ORAL_TABLET | Freq: Every day | ORAL | Status: DC
  Administered 2021-09-13 – 2021-09-17 (×5): 20 mg via ORAL
  Filled 2021-09-13 (×5): qty 1

## 2021-09-13 NOTE — Care Management Important Message (Signed)
Important Message ? ?Patient Details  ?Name: Kevin [name] ?MRN: [id number] ?Date of Birth: 1945-10-24 ? ? ?Medicare Important Message Given:  Yes ? ? ? ? ?Tommy Medal ?09/13/2021, 12:18 PM ?

## 2021-09-13 NOTE — Progress Notes (Addendum)
[facility] Progress Note ? ?   ?Subjective: ?Patient seen and evaluated. He reports feeling much better today compared to when he came into the hospital. He reports that he had a small bowel movement yesterday evening. He says that he has been passing flatus. He has been tolerating clear liquid diet well. He denies nausea and vomiting.  ? ?NG tube was removed yesterday. ? ?Objective: ?Vital signs in last 24 hours: ?Temp:  [97.6 ?F (36.4 ?C)-98.2 ?F (36.8 ?C)] 98 ?F (36.7 ?C) (05/12 0315) ?Pulse Rate:  [97-117] 102 (05/12 0605) ?Resp:  [17-18] 18 (05/12 0315) ?BP: (117-139)/(73-78) 117/75 (05/12 2297) ?SpO2:  [94 %-97 %] 94 % (05/12 0605) ?Last BM Date : 09/12/21 ? ?Intake/Output from previous day: ?05/11 0701 - 05/12 0700 ?In: 1127.4 [P.O.:240; I.V.:887.4] ?Out: 375 [Urine:375] ?Intake/Output this shift: ?Total I/O ?In: 240 [P.O.:240] ?Out: -  ? ?Physical Exam ?Constitutional: Awake, pleasant, sitting in chair. No acute distress. ?Abdomen: Soft, non-distended. No tenderness to palpation. No rigidity or guarding. No rebound tenderness. Lap band button noted. ?MSK: Moves all extremities equally.  ?Skin: Warm and well perfused. ?Neuro: AAOx3. No focal deficits on exam ?Psych: Normal mood and affect. ? ?Lab Results:  ?Recent Labs  ?  09/12/21 ?0403 09/13/21 ?0501  ?WBC 4.8 5.8  ?HGB 13.5 12.5*  ?HCT 41.8 39.3  ?PLT 159 161  ? ?BMET ?Recent Labs  ?  09/12/21 ?0403 09/13/21 ?0501  ?NA 142 145  ?K 4.2 3.5  ?CL 108 109  ?CO2 25 27  ?GLUCOSE 179* 130*  ?BUN 87* 67*  ?CREATININE 2.05* 1.54*  ?CALCIUM 9.1 9.0  ? ?Studies/Results: ?DG Abd 1 View ? ?Result Date: 09/11/2021 ?CLINICAL DATA:  NG tube placement. EXAM: ABDOMEN - 1 VIEW COMPARISON:  10/08/2010 FINDINGS: 1305 hours. NG tube tip is in the mid stomach. Visualized upper abdomen reveals gaseous bowel distension. Lap band overlies the medial left upper quadrant. IMPRESSION: NG tube tip is in the mid stomach. Lap band noted in the region of the  esophagogastric junction. Electronically Signed   By: Misty Stanley M.D.   On: 09/11/2021 13:13  ? ?DG Abd Portable 1V-Small Bowel Obstruction Protocol-initial, 8 hr delay ? ?Result Date: 09/12/2021 ?CLINICAL DATA:  Small bowel obstruction, 8 hour delayed film. EXAM: PORTABLE ABDOMEN - 1 VIEW COMPARISON:  Radiograph yesterday.  CT 09/10/2021 FINDINGS: There is enteric contrast in the ascending, transverse, descending and rectosigmoid colon. Mild persistent gaseous small bowel distension centrally. Laparoscopic gastric band. Enteric tube tip passes beyond the band. IMPRESSION: Enteric contrast in the ascending, transverse, descending and rectosigmoid colon. Findings consistent with resolving or partial small bowel obstruction. Mild persistent gaseous small bowel distension centrally. Electronically Signed   By: Keith Rake M.D.   On: 09/12/2021 19:53   ? ?Assessment/Plan: ?Kevin [name] is a 76 y.o. male who was admitted for small bowel obstruction.  ? ?Neuro ?- Dilaudid 0.'5mg'$  IV q4h prn for moderate-severe pain ?Cardiac ?- Lopressor '5mg'$  IV q8h ?GI ?- Full liquid diet, advance diet as tolerated ?- SSI moderate ?- CBG monitoring q4h ?- Semglee 15u [place] daily ?- Dulcolax suppository '10mg'$  daily ?- Zofran '4mg'$  IV q6h prn for nausea ?GU ?- LR 131m/hr IV ?- Creatinine trending down to 1.54 from 2.05 yesterday. ?- BMP ?Heme ?- CBC ?- Continue holding aspirin and Plavix ?Prophylaxis ?- SCDs ? ? LOS: 3 days  ? ? ?ENelva Nay Medical Student ?09/13/2021 ? ?

## 2021-09-13 NOTE — Progress Notes (Signed)
?Progress Note ? ? ?Patient: Kevin [name] QZE:[id number] DOB: Oct 02, 1945 DOA: 09/10/2021     3 ?DOS: the patient was seen and examined on 09/13/2021 ?  ?Brief hospital course: ?As per H&P written by Dr. Josephine Cables on 09/10/2021 ?Kevin [name] is a 76 y.o. male with medical history significant of CAD s/p PCI, hypertension, hyperlipidemia, type 2 diabetes with who presents to the emergency department due to abdominal pain and nausea which started yesterday.  Patient complained of sudden onset of abdominal pain which was associated with nausea and dry heaving.  He complained of abdominal distention since onset of symptoms, stated to go to the ED for further evaluation and management.  Of note, patient had a lap band done in [facility] in 2011.  He denies chest pain, shortness of breath, fever, chills. ?  ?ED Course:  ?In the emergency department, he was hemodynamically stable.  Work-up in the ED showed normal CBC. BMP showed BUN/creatinine 95/3.42 (baseline creatinine at 1.1-1.6), CBG 279, lipase 38. ?CT abdomen and pelvis without contrast showed Mid small bowel obstruction with point of transition within the left mid abdomen. Underlying adhesions suspected. Superimposed mildly incomplete duodenal rotation. No free intraperitoneal gas or fluid identified. ?He was treated with IV fentanyl and Zofran.  General surgery was consulted and recommended admitting patient with plan to see patient in the morning per ED physician.  Hospitalist was asked to admit patient for further evaluation and management ? ?Assessment and Plan: ?* Small bowel obstruction ([place]) ?-Reporting feeling much better today and currently without nausea or vomiting.  Patient also reports no abdominal pain. ?-NG to successfully remove overnight; so far tolerating liquid diet. ?-Following general surgery recommendation we will continue advancing diet; okay to start resuming home oral medications.   ?-Patient is small bowel obstruction protocol images demonstrated  resolution of obstruction and currently possible just partial SBO versus ileus.. ?-Continue to maintain adequate hydration; follow electrolytes and replete as needed (goal is for potassium above 4 and magnesium around 2). ?-Patient advised to increase physical activity. ? ? ?Type 2 diabetes mellitus with hyperglycemia ([facility]) ?-With nephropathy complication (chronic kidney disease a stage IIIa). ?-Currently advancing diet as tolerated. ?-Continue sliding scale insulin and long-acting insulin ?-Continue to hold oral hypoglycemic agents while inpatient. ?-Follow CBGs fluctuation and adjust regimen as required. ? ?AKI (acute kidney injury) ([place]) ?-Patient with underlying history of chronic kidney disease stage IIIa due to diabetes ?-baseline creatinine of 1.1-1.6 ?-After fluid resuscitation and holding nephrotoxic agents creatinine down to 1.54 (essentially back to baseline). ?-Will continue to follow renal function trend. ?-Continue minimizing nephrotoxic agents ?-Continue to maintain adequate hydration. ?-In the setting of prerenal azotemia continue use of nephrotoxic agents patient has presented with a peak creatinine of 3.42 and a BUN of 95 at time of admission. ? ?Obesity (BMI 30-39.9) ?-Body mass index is 37.45 kg/m?. ?-Low calorie diet, portion control and increase physical activity discussed with patient. ?-Patient with prior history of lap band placement in 2011. ? ?Abdominal pain ?-In the setting of SBO ?-Continue as needed analgesia ?-Patient reports no pain currently; tolerating diet. ?-Follow clinical response and continue supportive care. ? ?Mixed hyperlipidemia ?-Planning to resume statins  ?-Heart healthy diet discussed with patient. ? ?Essential hypertension ?-Blood pressure stable/rising. ?-Continue as needed hydralazine ?-Restart home oral metoprolol. ?-Follow vital signs. ? ?Coronary artery disease, hx LAD and LCX stenting in 2001 & 2004, last cath 2009 with patent stents. ?-No chest pain  currently ?-Resume oral metoprolol; assess for tolerance. ?-Continue holding  aspirin and Plavix for now; if well tolerated diet and initiation of oral meds, will resume on 09/14/2021.. ? ? ? ? ? ?Subjective:  ?Reports passing gas and having couple more bowel movements; no abdominal pain, no nausea, no vomiting.  So far tolerating liquid diet and has been without NG tube since 09/12/2021 at night. ? ?Physical Exam: ?Vitals:  ? 09/12/21 2145 09/13/21 0315 09/13/21 0605 09/13/21 1328  ?BP:  139/78 117/75 127/73  ?Pulse: (!) 101 (!) 110 (!) 102 97  ?Resp:  18    ?Temp:  98 ?F (36.7 ?C)    ?TempSrc:      ?SpO2:  97% 94% 100%  ?Weight:      ?Height:      ? ?General exam: Alert, awake, oriented x 3; no chest pain, no nausea, no vomiting, no abdominal pain.  Patient currently with NG tube removed and tolerating liquid diet. ?Respiratory system: Clear to auscultation. Respiratory effort normal.  No using accessory muscle.  Good saturation on room air. ?Cardiovascular system:RRR. No murmurs, rubs, gallops.  No JVD on exam. ?Gastrointestinal system: Abdomen is obese, nondistended, soft and nontender. No organomegaly or masses felt. Normal bowel sounds heard. ?Central nervous system: Alert and oriented. No focal neurological deficits. ?Extremities: No cyanosis or clubbing; trace to 1+ edema appreciated bilaterally. ?Skin: No petechiae. ?Psychiatry: Judgement and insight appear normal. Mood & affect appropriate.  ? ?Data Reviewed: ?Basic metabolic panel demonstrating sodium 145, potassium 3.5, chloride 109, blood sugar has been in the 130-170s range; BUN 67 creatinine 1.54 ? ?Family Communication: No family at bedside. ? ?Disposition: ?Status is: Inpatient ?Remains inpatient appropriate because: Continue management of SBO and acute on chronic renal failure.. ? ? Planned Discharge Destination: Home ? ? ?Author: ?Barton Dubois, MD ?09/13/2021 5:41 PM ? ?For on call review [email or web address].  ?

## 2021-09-13 NOTE — Progress Notes (Signed)
550 mL of brown output removed from canister after taking NG tube out.  ?

## 2021-09-14 DIAGNOSIS — K56609 Unspecified intestinal obstruction, unspecified as to partial versus complete obstruction: Secondary | ICD-10-CM | POA: Diagnosis not present

## 2021-09-14 DIAGNOSIS — I10 Essential (primary) hypertension: Secondary | ICD-10-CM | POA: Diagnosis not present

## 2021-09-14 DIAGNOSIS — I2510 Atherosclerotic heart disease of native coronary artery without angina pectoris: Secondary | ICD-10-CM | POA: Diagnosis not present

## 2021-09-14 DIAGNOSIS — N179 Acute kidney failure, unspecified: Secondary | ICD-10-CM | POA: Diagnosis not present

## 2021-09-14 LAB — GLUCOSE, CAPILLARY
Glucose-Capillary: 126 mg/dL — ABNORMAL HIGH (ref 70–99)
Glucose-Capillary: 131 mg/dL — ABNORMAL HIGH (ref 70–99)
Glucose-Capillary: 131 mg/dL — ABNORMAL HIGH (ref 70–99)
Glucose-Capillary: 139 mg/dL — ABNORMAL HIGH (ref 70–99)
Glucose-Capillary: 140 mg/dL — ABNORMAL HIGH (ref 70–99)
Glucose-Capillary: 186 mg/dL — ABNORMAL HIGH (ref 70–99)

## 2021-09-14 LAB — BASIC METABOLIC PANEL
Anion gap: 7 (ref 5–15)
BUN: 51 mg/dL — ABNORMAL HIGH (ref 8–23)
CO2: 29 mmol/L (ref 22–32)
Calcium: 9.1 mg/dL (ref 8.9–10.3)
Chloride: 107 mmol/L (ref 98–111)
Creatinine, Ser: 1.31 mg/dL — ABNORMAL HIGH (ref 0.61–1.24)
GFR, Estimated: 57 mL/min — ABNORMAL LOW (ref >60)
Glucose, Bld: 131 mg/dL — ABNORMAL HIGH (ref 70–99)
Potassium: 4.1 mmol/L (ref 3.5–5.1)
Sodium: 143 mmol/L (ref 135–145)

## 2021-09-14 LAB — URIC ACID: Uric Acid, Serum: 11.1 mg/dL — ABNORMAL HIGH (ref 3.7–8.6)

## 2021-09-14 MED ORDER — PANTOPRAZOLE SODIUM 40 MG PO TBEC
40.0000 mg | DELAYED_RELEASE_TABLET | Freq: Every day | ORAL | Status: DC
  Administered 2021-09-14 – 2021-09-16 (×3): 40 mg via ORAL
  Filled 2021-09-14 (×3): qty 1

## 2021-09-14 MED ORDER — FUROSEMIDE 40 MG PO TABS
40.0000 mg | ORAL_TABLET | Freq: Every day | ORAL | Status: DC
  Administered 2021-09-14 – 2021-09-16 (×3): 40 mg via ORAL
  Filled 2021-09-14 (×3): qty 1

## 2021-09-14 MED ORDER — METHYLPREDNISOLONE 4 MG PO TABS
16.0000 mg | ORAL_TABLET | Freq: Two times a day (BID) | ORAL | Status: DC
  Administered 2021-09-14 – 2021-09-15 (×2): 16 mg via ORAL
  Filled 2021-09-14 (×2): qty 4

## 2021-09-14 NOTE — Progress Notes (Signed)
[facility] Progress Note ? ?   ?Subjective: ?Patient seen and examined.  He is currently sleeping with his CPAP in place.  His only complaint at this time is pain in his bilateral ankles.  He is tolerating a diet without nausea and vomiting, and he is moving his bowels without issue.  He denies fevers and chills. ? ?Objective: ?Vital signs in last 24 hours: ?Temp:  [98 ?F (36.7 ?C)-98.5 ?F (36.9 ?C)] 98.5 ?F (36.9 ?C) (05/13 0414) ?Pulse Rate:  [85-99] 85 (05/13 0414) ?Resp:  [18-20] 18 (05/13 0414) ?BP: (112-137)/(62-73) 112/62 (05/13 0414) ?SpO2:  [96 %-100 %] 99 % (05/13 0414) ?Last BM Date : 09/13/21 ? ?Intake/Output from previous day: ?05/12 0701 - 05/13 0700 ?In: 480 [P.O.:480] ?Out: 500 [Urine:500] ?Intake/Output this shift: ?Total I/O ?In: 240 [P.O.:240] ?Out: -  ? ?General appearance: alert, cooperative, and no distress ?GI: Abdomen soft, nondistended, no percussion tenderness, nontender to palpation; no rigidity, guarding, rebound tenderness ? ?Lab Results:  ?Recent Labs  ?  09/12/21 ?0403 09/13/21 ?0501  ?WBC 4.8 5.8  ?HGB 13.5 12.5*  ?HCT 41.8 39.3  ?PLT 159 161  ? ?BMET ?Recent Labs  ?  09/13/21 ?0501 09/14/21 ?0448  ?NA 145 143  ?K 3.5 4.1  ?CL 109 107  ?CO2 27 29  ?GLUCOSE 130* 131*  ?BUN 67* 51*  ?CREATININE 1.54* 1.31*  ?CALCIUM 9.0 9.1  ? ?PT/INR ?No results for input(s): LABPROT, INR in the last 72 hours. ? ?Studies/Results: ?DG Abd Portable 1V-Small Bowel Obstruction Protocol-initial, 8 hr delay ? ?Result Date: 09/12/2021 ?CLINICAL DATA:  Small bowel obstruction, 8 hour delayed film. EXAM: PORTABLE ABDOMEN - 1 VIEW COMPARISON:  Radiograph yesterday.  CT 09/10/2021 FINDINGS: There is enteric contrast in the ascending, transverse, descending and rectosigmoid colon. Mild persistent gaseous small bowel distension centrally. Laparoscopic gastric band. Enteric tube tip passes beyond the band. IMPRESSION: Enteric contrast in the ascending, transverse, descending and rectosigmoid  colon. Findings consistent with resolving or partial small bowel obstruction. Mild persistent gaseous small bowel distension centrally. Electronically Signed   By: Keith Rake M.D.   On: 09/12/2021 19:53   ? ?Anti-infectives: ?Anti-infectives (From admission, onward)  ? ? None  ? ?  ? ? ?Assessment/Plan: ? ?Patient is 76 year old male who was admitted with small bowel obstruction. ? ?-Small bowel obstruction currently resolved, as patient tolerating solid food without nausea and vomiting, and moving his bowels ?-Okay for regular diet from general surgery standpoint, currently on dysphagia 3 diet ?-PRN pain control and antiemetics ?-Okay to restart the patient's Plavix and blood thinners ?-Stable for discharge from general surgery standpoint ?-High-fiber diet information included in discharge instructions ?-Remainder of care per primary team ? ? LOS: 4 days  ? ? ?Joreen Swearingin A Teja Costen ?09/14/2021 ? ?

## 2021-09-14 NOTE — Progress Notes (Signed)
Patient given his evening meds. He was a bit nauseated upon entering room. Provided prn zofran. Patient instructed to wait a few minutes prior to eating to let zofran kick in. Will reassess. ?

## 2021-09-14 NOTE — Progress Notes (Signed)
?Progress Note ? ? ?Patient: Kevin [name]:[id number] DOB: 1946-04-11 DOA: 09/10/2021     4 ?DOS: the patient was seen and examined on 09/14/2021 ?  ?Brief hospital course: ?As per H&P written by Dr. Josephine Cables on 09/10/2021 ?Kevin [name] is a 76 y.o. male with medical history significant of CAD s/p PCI, hypertension, hyperlipidemia, type 2 diabetes with who presents to the emergency department due to abdominal pain and nausea which started yesterday.  Patient complained of sudden onset of abdominal pain which was associated with nausea and dry heaving.  He complained of abdominal distention since onset of symptoms, stated to go to the ED for further evaluation and management.  Of note, patient had a lap band done in [facility] in 2011.  He denies chest pain, shortness of breath, fever, chills. ?  ?ED Course:  ?In the emergency department, he was hemodynamically stable.  Work-up in the ED showed normal CBC. BMP showed BUN/creatinine 95/3.42 (baseline creatinine at 1.1-1.6), CBG 279, lipase 38. ?CT abdomen and pelvis without contrast showed Mid small bowel obstruction with point of transition within the left mid abdomen. Underlying adhesions suspected. Superimposed mildly incomplete duodenal rotation. No free intraperitoneal gas or fluid identified. ?He was treated with IV fentanyl and Zofran.  General surgery was consulted and recommended admitting patient with plan to see patient in the morning per ED physician.  Hospitalist was asked to admit patient for further evaluation and management ? ?Assessment and Plan: ?* Small bowel obstruction ([place]) ?-Reporting feeling much better today and currently without nausea or vomiting.  Patient also reports no abdominal pain. ?-NG to successfully remove overnight; so far tolerating liquid diet. ?-Patient has been able to tolerate soft diet; continue passing gas and moving bowel movements. ?-Per general surgery safe to discharge home. ?-Will start resuming home oral  medications. ? ?Type 2 diabetes mellitus with hyperglycemia ([place]) ?-With nephropathy complication (chronic kidney disease a stage IIIa). ?-Currently advancing diet as tolerated. ?-Continue sliding scale insulin and long-acting insulin ?-Continue to hold oral hypoglycemic agents while inpatient. ?-Initiation of his steroids could potentially increase CBGs; will follow trend and adjust management as required. ? ?AKI (acute kidney injury) ([place]) ?-Patient with underlying history of chronic kidney disease stage IIIa due to diabetes ?-baseline creatinine of 1.1-1.6 ?-After fluid resuscitation and holding nephrotoxic agents creatinine down to 1.3 (essentially back to baseline). ?-Will continue to follow renal function trend. ?-Continue minimizing nephrotoxic agents ?-Continue to maintain adequate hydration. ?-In the setting of prerenal azotemia continue to minimize the use of nephrotoxic agents; patient has presented with a peak creatinine of 3.42 and a BUN of 95 at time of admission. ? ?Obesity (BMI 30-39.9) ?-Body mass index is 37.45 kg/m?. ?-Low calorie diet, portion control and increase physical activity discussed with patient. ?-Patient with prior history of lap band placement in 2011. ? ?Abdominal pain ?-In the setting of SBO ?-Continue supportive care and follow clinical response ?-No further pain reported. ? ?Mixed hyperlipidemia ?-Continue statins. ?-Heart healthy diet discussed with patient. ? ?Essential hypertension ?-Blood pressure stable/rising. ?-Continue as needed hydralazine ?-Continue home metoprolol and Lasix. ?-Follow vital signs. ?-Heart healthy diet encouraged. ? ?Coronary artery disease, hx LAD and LCX stenting in 2001 & 2004, last cath 2009 with patent stents. ?-No chest pain currently ?-Continue oral metoprolol. ?-Resume aspirin and Plavix at discharge. ? ?Gout: ?-Patient will be treated with methylprednisolone ?-Continue as needed analgesics ?-Follow clinical response. ?-Patient reports no prior  history of gout; will check uric acid. ? ? ? ?Subjective:  ?  Patient reports continue having bowel movement and passing gas; no chest pain, no nausea, no vomiting, tolerating diet.  Overnight with significant bilateral ankle pain and swelling that is preventing him to bear weight or walk. ? ?Physical Exam: ?Vitals:  ? 09/13/21 2011 09/13/21 2115 09/14/21 0414 09/14/21 1407  ?BP: 137/72  112/62 (!) 115/51  ?Pulse: 99 86 85 81  ?Resp: '20 18 18 20  '$ ?Temp: 98 ?F (36.7 ?C)  98.5 ?F (36.9 ?C) 98.5 ?F (36.9 ?C)  ?TempSrc: Oral     ?SpO2: 100% 96% 99% 98%  ?Weight:      ?Height:      ? ?General exam: Alert, awake, oriented x 3; reporting no nausea, no vomiting, tolerating diet and expressing bowel movements and passing gas.  Severe complaints of bilateral ankle pain and swelling with inability to bear weight or walk. ?Respiratory system: Clear to auscultation. Respiratory effort normal.  No using accessory muscle.  Good saturation on room air. ?Cardiovascular system:RRR. No murmurs, rubs, gallops.  Unable to properly assess JVD with body habitus. ?Gastrointestinal system: Abdomen is obese, soft, nontender to palpation, positive bowel sounds.   ?Central nervous system: Alert and oriented. No focal neurological deficits. ?Extremities: No cyanosis or clubbing; 2+ edema appreciated bilaterally; significant tenderness to palpation and swelling appreciated around both ankles, no crepitations, slight warmth sensation to palpation.  No open wounds or drainage. ?Skin: No petechiae. ?Psychiatry: Judgement and insight appear normal. Mood & affect appropriate.  ? ?Data Reviewed: ?Sodium 143, potassium 4.1, chloride 107, BUN 51, creatinine 1.31. ? ? ?Family Communication: No family at bedside. ? ?Disposition: ?Status is: Inpatient ?Remains inpatient appropriate because: Continue management of SBO and acute on chronic renal failure.. ? ? Planned Discharge Destination: Home ? ? ?Author: ?Barton Dubois, MD ?09/14/2021 4:25 PM ? ?For on call  review [email or web address].  ?

## 2021-09-14 NOTE — Progress Notes (Signed)
Patient reported pain to his feet that started over night, inhibiting him from standing on them. Notified Dr. Dyann Kief, and provided pain meds as ordered. Patient assisted in repositioning in bed and he requested to go back on his cpap. Done. Call bell within reach. ?

## 2021-09-15 DIAGNOSIS — I2510 Atherosclerotic heart disease of native coronary artery without angina pectoris: Secondary | ICD-10-CM | POA: Diagnosis not present

## 2021-09-15 DIAGNOSIS — K56609 Unspecified intestinal obstruction, unspecified as to partial versus complete obstruction: Secondary | ICD-10-CM | POA: Diagnosis not present

## 2021-09-15 DIAGNOSIS — I10 Essential (primary) hypertension: Secondary | ICD-10-CM | POA: Diagnosis not present

## 2021-09-15 DIAGNOSIS — N179 Acute kidney failure, unspecified: Secondary | ICD-10-CM | POA: Diagnosis not present

## 2021-09-15 LAB — GLUCOSE, CAPILLARY
Glucose-Capillary: 148 mg/dL — ABNORMAL HIGH (ref 70–99)
Glucose-Capillary: 133 mg/dL — ABNORMAL HIGH (ref 70–99)
Glucose-Capillary: 178 mg/dL — ABNORMAL HIGH (ref 70–99)
Glucose-Capillary: 156 mg/dL — ABNORMAL HIGH (ref 70–99)
Glucose-Capillary: 229 mg/dL — ABNORMAL HIGH (ref 70–99)

## 2021-09-15 MED ORDER — ALLOPURINOL 100 MG PO TABS
200.0000 mg | ORAL_TABLET | Freq: Every day | ORAL | Status: DC
  Administered 2021-09-15 – 2021-09-17 (×3): 200 mg via ORAL
  Filled 2021-09-15 (×3): qty 2

## 2021-09-15 MED ORDER — OXYCODONE HCL 5 MG PO TABS
10.0000 mg | ORAL_TABLET | Freq: Four times a day (QID) | ORAL | Status: DC | PRN
  Administered 2021-09-15: 10 mg via ORAL
  Filled 2021-09-15 (×2): qty 2

## 2021-09-15 MED ORDER — PREDNISONE 20 MG PO TABS
40.0000 mg | ORAL_TABLET | Freq: Every day | ORAL | Status: DC
  Administered 2021-09-16 – 2021-09-17 (×2): 40 mg via ORAL
  Filled 2021-09-15 (×2): qty 2

## 2021-09-15 NOTE — Progress Notes (Signed)
Patient seen and examined.  He is currently sitting on the toilet having a bowel movement.  His only complaint is ankle and foot pain.  He denies abdominal pain, nausea, and vomiting.  And he continues to move his bowels.  Stable for discharge from general surgery.  Please call with any questions or concerns. ? ?Graciella Freer, DO ?[facility] ?[address] [place], [place] [address] ?[phone] (office) ? ?

## 2021-09-15 NOTE — Progress Notes (Signed)
?Progress Note ? ? ?Patient: Kevin [name]:[id number] DOB: [birth year] DOA: 09/10/2021     5 ?DOS: the patient was seen and examined on 09/15/2021 ?  ?Brief hospital course: ?As per H&P written by Dr. Josephine Cables on 09/10/2021 ?Kevin [name] is a 76 y.o. male with medical history significant of CAD s/p PCI, hypertension, hyperlipidemia, type 2 diabetes with who presents to the emergency department due to abdominal pain and nausea which started yesterday.  Patient complained of sudden onset of abdominal pain which was associated with nausea and dry heaving.  He complained of abdominal distention since onset of symptoms, stated to go to the ED for further evaluation and management.  Of note, patient had a lap band done in [facility] in 2011.  He denies chest pain, shortness of breath, fever, chills. ?  ?ED Course:  ?In the emergency department, he was hemodynamically stable.  Work-up in the ED showed normal CBC. BMP showed BUN/creatinine 95/3.42 (baseline creatinine at 1.1-1.6), CBG 279, lipase 38. ?CT abdomen and pelvis without contrast showed Mid small bowel obstruction with point of transition within the left mid abdomen. Underlying adhesions suspected. Superimposed mildly incomplete duodenal rotation. No free intraperitoneal gas or fluid identified. ?He was treated with IV fentanyl and Zofran.  General surgery was consulted and recommended admitting patient with plan to see patient in the morning per ED physician.  Hospitalist was asked to admit patient for further evaluation and management ? ?Assessment and Plan: ?* Small bowel obstruction ([place]) ?-Reporting feeling much better today and currently without nausea or vomiting.  Patient also reports no abdominal pain. ?-NG to successfully remove overnight; so far tolerating liquid diet. ?-Patient has been able to tolerate soft diet; continue passing gas and moving bowel movements. ?-Per general surgery safe to discharge home. ?-Will start resuming home oral  medications. ? ?Type 2 diabetes mellitus with hyperglycemia ([place]) ?-With nephropathy complication (chronic kidney disease a stage IIIa). ?-Currently advancing diet as tolerated. ?-Continue sliding scale insulin and long-acting insulin ?-Continue to hold oral hypoglycemic agents while inpatient. ?-Initiation of his steroids could potentially increase CBGs; will follow trend and adjust management as required. ? ?AKI (acute kidney injury) ([place]) ?-Patient with underlying history of chronic kidney disease stage IIIa due to diabetes ?-baseline creatinine of 1.1-1.6 ?-After fluid resuscitation and holding nephrotoxic agents creatinine down to 1.3 (essentially back to baseline). ?-Will continue to follow renal function trend. ?-Continue minimizing nephrotoxic agents ?-Continue to maintain adequate hydration. ?-In the setting of prerenal azotemia continue to minimize the use of nephrotoxic agents; patient has presented with a peak creatinine of 3.42 and a BUN of 95 at time of admission. ? ?Obesity (BMI 30-39.9) ?-Body mass index is 37.45 kg/m?. ?-Low calorie diet, portion control and increase physical activity discussed with patient. ?-Patient with prior history of lap band placement in 2011. ? ?Abdominal pain ?-In the setting of SBO ?-Continue supportive care and follow clinical response ?-No further pain reported. ? ?Mixed hyperlipidemia ?-Continue statins. ?-Heart healthy diet discussed with patient. ? ?Essential hypertension ?-Blood pressure stable/rising. ?-Continue as needed hydralazine ?-Continue home metoprolol and Lasix. ?-Follow vital signs. ?-Heart healthy diet encouraged. ? ?Coronary artery disease, hx LAD and LCX stenting in 2001 & 2004, last cath 2009 with patent stents. ?-No chest pain currently ?-Continue oral metoprolol. ?-Resume aspirin and Plavix at discharge. ? ?Gout: ?-Continue as needed analgesics ?-Follow clinical response. ?-Patient reports no prior history of gout; uric acid 11.1 in the presence of  acute flare. ?-Continue treatment with steroids and  a*allopurinol. ? ?Subjective:  ?No nausea, no vomiting, no abdominal pain.  Continues to have bowel movements.  Afebrile.  Reports mild improvement in his ankle pain/swelling but still with significant difficulty ambulating. ? ?Physical Exam: ?Vitals:  ? 09/14/21 2204 09/15/21 0205 09/15/21 1100 09/15/21 1420  ?BP:  129/78 134/79 (!) 146/65  ?Pulse: 78 71 98 77  ?Resp: '18 16 16 18  '$ ?Temp:  97.8 ?F (36.6 ?C)  98.9 ?F (37.2 ?C)  ?TempSrc:  Oral    ?SpO2: 92% 99% 99% 100%  ?Weight:      ?Height:      ? ?General exam: Alert, awake, oriented x 3; in no major distress. ?Respiratory system: Clear to auscultation. Respiratory effort normal.  Good saturation on room air ?Cardiovascular system:RRR. No murmurs, rubs, gallops.  No JVD ?Gastrointestinal system: Abdomen is obese, nondistended, soft and nontender. No organomegaly or masses felt. Normal bowel sounds heard. ?Central nervous system: Alert and oriented. No focal neurological deficits. ?Extremities: No cyanosis or clubbing.  Bilateral ankle swelling and tenderness to palpation (left more than right). ?Skin: No petechiae; no rashes. ?Psychiatry: Judgement and insight appear normal. Mood & affect appropriate.  ? ?Data Reviewed: ?Uric acid: 11.1 ? ? ?Family Communication: No family at bedside. ? ?Disposition: ?Status is: Inpatient ?Remains inpatient appropriate because: Continue management of SBO and acute on chronic renal failure.. ? ? Planned Discharge Destination: Home ? ? ?Author: ?Barton Dubois, MD ?09/15/2021 3:29 PM ? ?For on call review [email or web address].  ?

## 2021-09-15 NOTE — Progress Notes (Signed)
Pt able to ambulate using FWW from recliner to bathroom, states had "good" bowel movement. States pain some better but still very painful when weight bearing. States pain med given earlier made him more sleepy than anything, but it did help the pain in his ankles/feet enough that he could walk a short distance. ?

## 2021-09-16 DIAGNOSIS — N179 Acute kidney failure, unspecified: Secondary | ICD-10-CM | POA: Diagnosis not present

## 2021-09-16 DIAGNOSIS — I2510 Atherosclerotic heart disease of native coronary artery without angina pectoris: Secondary | ICD-10-CM | POA: Diagnosis not present

## 2021-09-16 DIAGNOSIS — I10 Essential (primary) hypertension: Secondary | ICD-10-CM | POA: Diagnosis not present

## 2021-09-16 DIAGNOSIS — K56609 Unspecified intestinal obstruction, unspecified as to partial versus complete obstruction: Secondary | ICD-10-CM | POA: Diagnosis not present

## 2021-09-16 LAB — BASIC METABOLIC PANEL
Anion gap: 7 (ref 5–15)
BUN: 38 mg/dL — ABNORMAL HIGH (ref 8–23)
CO2: 28 mmol/L (ref 22–32)
Calcium: 9.3 mg/dL (ref 8.9–10.3)
Chloride: 106 mmol/L (ref 98–111)
Creatinine, Ser: 1.54 mg/dL — ABNORMAL HIGH (ref 0.61–1.24)
GFR, Estimated: 47 mL/min — ABNORMAL LOW (ref >60)
Glucose, Bld: 110 mg/dL — ABNORMAL HIGH (ref 70–99)
Potassium: 4.1 mmol/L (ref 3.5–5.1)
Sodium: 141 mmol/L (ref 135–145)

## 2021-09-16 LAB — GLUCOSE, CAPILLARY
Glucose-Capillary: 131 mg/dL — ABNORMAL HIGH (ref 70–99)
Glucose-Capillary: 114 mg/dL — ABNORMAL HIGH (ref 70–99)
Glucose-Capillary: 100 mg/dL — ABNORMAL HIGH (ref 70–99)
Glucose-Capillary: 160 mg/dL — ABNORMAL HIGH (ref 70–99)
Glucose-Capillary: 248 mg/dL — ABNORMAL HIGH (ref 70–99)
Glucose-Capillary: 273 mg/dL — ABNORMAL HIGH (ref 70–99)

## 2021-09-16 MED ORDER — FUROSEMIDE 20 MG PO TABS
20.0000 mg | ORAL_TABLET | Freq: Every day | ORAL | Status: DC
  Administered 2021-09-17: 20 mg via ORAL
  Filled 2021-09-16: qty 1

## 2021-09-16 MED ORDER — POLYETHYLENE GLYCOL 3350 17 G PO PACK
17.0000 g | PACK | Freq: Every day | ORAL | Status: DC
  Administered 2021-09-16 – 2021-09-21 (×3): 17 g via ORAL
  Filled 2021-09-16 (×6): qty 1

## 2021-09-16 MED ORDER — ASPIRIN 81 MG PO CHEW
81.0000 mg | CHEWABLE_TABLET | Freq: Every day | ORAL | Status: DC
  Administered 2021-09-16: 81 mg via ORAL
  Filled 2021-09-16 (×2): qty 1

## 2021-09-16 MED ORDER — PANTOPRAZOLE SODIUM 40 MG PO TBEC
40.0000 mg | DELAYED_RELEASE_TABLET | Freq: Two times a day (BID) | ORAL | Status: DC
  Administered 2021-09-16 – 2021-09-17 (×3): 40 mg via ORAL
  Filled 2021-09-16 (×3): qty 1

## 2021-09-16 MED ORDER — CLOPIDOGREL BISULFATE 75 MG PO TABS
75.0000 mg | ORAL_TABLET | Freq: Every day | ORAL | Status: DC
  Administered 2021-09-16: 75 mg via ORAL
  Filled 2021-09-16 (×2): qty 1

## 2021-09-16 NOTE — Progress Notes (Signed)
Pt began to have abd pain after eating supper similar to pain with SBO. States pain has improved but still aching a bit. No BM today but is passing gas. Did not take dulcolax supp this am due to bowels moving last pm. MD Dyann Kief notified and orders received for miralax. Pt notified and agreeable. ?

## 2021-09-16 NOTE — Care Management Important Message (Signed)
Important Message ? ?Patient Details  ?Name: Kevin [name] ?MRN: [id number] ?Date of Birth: May 18, 1945 ? ? ?Medicare Important Message Given:  Yes ? ? ? ? ?Tommy Medal ?09/16/2021, 12:50 PM ?

## 2021-09-16 NOTE — Progress Notes (Signed)
Patient was not ready for CPAP as usual tonight.  Patient stated he has been having some tummy issues similar to what he had when he came in.  Did not want to go on CPAP at this time.  Asked patient to have his RN to call me when he is ready to go.  Patient in agreement. ?

## 2021-09-16 NOTE — Progress Notes (Signed)
?Progress Note ? ? ?Patient: Kevin [name] GQQ:[id number] DOB: 1945-08-07 DOA: 09/10/2021     6 ?DOS: the patient was seen and examined on 09/16/2021 ?  ?Brief hospital course: ?As per H&P written by Dr. Josephine [name] on 09/10/2021 ?Kevin [name] is a 76 y.o. male with medical history significant of CAD s/p PCI, hypertension, hyperlipidemia, type 2 diabetes with who presents to the emergency department due to abdominal pain and nausea which started yesterday.  Patient complained of sudden onset of abdominal pain which was associated with nausea and dry heaving.  He complained of abdominal distention since onset of symptoms, stated to go to the ED for further evaluation and management.  Of note, patient had a lap band done in [facility] in 2011.  He denies chest pain, shortness of breath, fever, chills. ?  ?ED Course:  ?In the emergency department, he was hemodynamically stable.  Work-up in the ED showed normal CBC. BMP showed BUN/creatinine 95/3.42 (baseline creatinine at 1.1-1.6), CBG 279, lipase 38. ?CT abdomen and pelvis without contrast showed Mid small bowel obstruction with point of transition within the left mid abdomen. Underlying adhesions suspected. Superimposed mildly incomplete duodenal rotation. No free intraperitoneal gas or fluid identified. ?He was treated with IV fentanyl and Zofran.  General surgery was consulted and recommended admitting patient with plan to see patient in the morning per ED physician.  Hospitalist was asked to admit patient for further evaluation and management ? ?Assessment and Plan: ?* Small bowel obstruction ([place]) ?-Reporting feeling much better today and currently without nausea or vomiting.  Patient also reports no abdominal pain. ?-NG to successfully remove overnight; so far tolerating liquid diet. ?-Patient has been able to tolerate soft diet; continue passing gas and moving bowel movements. ?-Per general surgery safe to discharge home. ?-Will start resuming home oral  medications. ? ?Type 2 diabetes mellitus with hyperglycemia ([place]) ?-With nephropathy complication (chronic kidney disease a stage IIIa). ?-Currently advancing diet as tolerated. ?-Continue sliding scale insulin and long-acting insulin ?-Continue to hold oral hypoglycemic agents while inpatient. ?-Initiation of his steroids could potentially increase CBGs; will follow trend and adjust management as required. ? ?AKI (acute kidney injury) ([place]) ?-Patient with underlying history of chronic kidney disease stage IIIa due to diabetes ?-baseline creatinine of 1.1-1.6 ?-After fluid resuscitation and holding nephrotoxic agents creatinine down to 1.5 currently (essentially back to baseline). ?-Will continue to follow renal function trend. ?-Continue minimizing nephrotoxic agents ?-Continue to maintain adequate hydration. ?-In the setting of prerenal azotemia continue to minimize the use of nephrotoxic agents; patient has presented with a peak creatinine of 3.42 and a BUN of 95 at time of admission. ? ?Obesity (BMI 30-39.9) ?-Body mass index is 37.45 kg/m?. ?-Low calorie diet, portion control and increase physical activity discussed with patient. ?-Patient with prior history of lap band placement in 2011. ? ?Abdominal pain ?-In the setting of SBO ?-Continue supportive care and follow clinical response ?-No further pain reported. ?-Tolerating diet without problems and reporting no nausea or vomiting. ? ?Mixed hyperlipidemia ?-Continue statins. ?-Heart healthy diet discussed with patient. ? ?Essential hypertension ?-Blood pressure stable/rising. ?-Continue as needed hydralazine ?-Continue home metoprolol and Lasix. ?-Follow vital signs. ?-Heart healthy diet encouraged. ? ?Coronary artery disease, hx LAD and LCX stenting in 2001 & 2004, last cath 2009 with patent stents. ?-No chest pain currently ?-Continue oral metoprolol. ?-Resume aspirin and Plavix ? ?Gout: ?-Continue as needed analgesics ?-Follow clinical response. ?-Patient  reports no prior history of gout; uric acid 11.1 in the  presence of acute flare. ?-Continue treatment with steroids and allopurinol. ?-Patient advised to keep himself well-hydrated. ? ?Subjective:  ?No nausea, no vomiting, no chest pain, reports no abdominal pain and continue to move his bowels.  Patient is afebrile.  No shortness of breath and expressing just pain in his left ankle to the point of being unable to bear weight. ? ?Physical Exam: ?Vitals:  ? 09/15/21 2055 09/15/21 2205 09/16/21 1101 09/16/21 1332  ?BP: 129/63  (!) 152/82 130/63  ?Pulse: 69 67 70 65  ?Resp: '20 18 18 20  '$ ?Temp: 97.9 ?F (36.6 ?C)   98.8 ?F (37.1 ?C)  ?TempSrc: Oral   Oral  ?SpO2: 97% 97% 100% 97%  ?Weight:      ?Height:      ? ?General exam: Alert, awake, oriented x 3; no nausea, no vomiting, no chest pain.  Patient reports so far tolerating diet without problems and continued to move his bowels and expressed no abdominal pain.  Continues to have significant left ankle discomfort and inability to bear weight. ?Respiratory system: Clear to auscultation. Respiratory effort normal.  No using accessory muscle.  Good saturation on room air. ?Cardiovascular system:RRR. No murmurs, rubs, gallops.  No JVD. ?Gastrointestinal system: Abdomen is obese, nondistended, soft and nontender. No organomegaly or masses felt. Normal bowel sounds heard. ?Central nervous system: Alert and oriented. No focal neurological deficits. ?Extremities: No cyanosis or clubbing; positive left more than right bilateral ankle edema and trace to 1+ swelling of his lower extremities bilaterally (according to patient unchanged from his baseline and most likely associated with lymphedema).  Reports pain to palpation to his ankle joint on the left side.  No crepitations appreciated. ?Skin: No rashes, no petechiae. ?Psychiatry: Judgement and insight appear normal. Mood & affect appropriate.  ? ? ?Data Reviewed: ?Uric acid: 11.1 ?Basic metabolic panel demonstrating sodium 141,  potassium 4.1, chloride 106, BUN 38, creatinine 1.5 ? ? ?Family Communication: No family at bedside. ? ?Disposition: ?Status is: Inpatient ?Remains inpatient appropriate because: Continue management of SBO and acute on chronic renal failure.. ? ? Planned Discharge Destination: Home ? ? ?Author: ?Barton Dubois, MD ?09/16/2021 3:37 PM ? ?For on call review [email or web address]  ?

## 2021-09-17 ENCOUNTER — Inpatient Hospital Stay (HOSPITAL_COMMUNITY): Payer: Medicare PPO

## 2021-09-17 DIAGNOSIS — K56609 Unspecified intestinal obstruction, unspecified as to partial versus complete obstruction: Secondary | ICD-10-CM | POA: Diagnosis not present

## 2021-09-17 LAB — GLUCOSE, CAPILLARY
Glucose-Capillary: 104 mg/dL — ABNORMAL HIGH (ref 70–99)
Glucose-Capillary: 135 mg/dL — ABNORMAL HIGH (ref 70–99)
Glucose-Capillary: 175 mg/dL — ABNORMAL HIGH (ref 70–99)
Glucose-Capillary: 178 mg/dL — ABNORMAL HIGH (ref 70–99)
Glucose-Capillary: 182 mg/dL — ABNORMAL HIGH (ref 70–99)
Glucose-Capillary: 198 mg/dL — ABNORMAL HIGH (ref 70–99)
Glucose-Capillary: 99 mg/dL (ref 70–99)

## 2021-09-17 MED ORDER — FAMOTIDINE 20 MG PO TABS: 20.0000 mg | ORAL_TABLET | Freq: Every day | ORAL | Status: DC

## 2021-09-17 MED ORDER — METOPROLOL TARTRATE 5 MG/5ML IV SOLN
2.5000 mg | Freq: Three times a day (TID) | INTRAVENOUS | Status: DC
  Administered 2021-09-17 – 2021-09-20 (×10): 2.5 mg via INTRAVENOUS
  Filled 2021-09-17 (×11): qty 5

## 2021-09-17 MED ORDER — METHYLPREDNISOLONE SODIUM SUCC 40 MG IJ SOLR
40.0000 mg | Freq: Every day | INTRAMUSCULAR | Status: AC
  Administered 2021-09-17 – 2021-09-19 (×3): 40 mg via INTRAVENOUS
  Filled 2021-09-17 (×4): qty 1

## 2021-09-17 NOTE — Progress Notes (Signed)
?Progress Note ? ? ?Patient: Kevin [name] KXF:[id number] DOB: Mar 02, 1946 DOA: 09/10/2021     7 ?DOS: the patient was seen and examined on 09/17/2021 ?  ?Brief hospital course: ?As per H&P written by Dr. Josephine Cables on 09/10/2021 ?Kevin [name] is a 76 y.o. male with medical history significant of CAD s/p PCI, hypertension, hyperlipidemia, type 2 diabetes with who presents to the emergency department due to abdominal pain and nausea which started yesterday.  Patient complained of sudden onset of abdominal pain which was associated with nausea and dry heaving.  He complained of abdominal distention since onset of symptoms, stated to go to the ED for further evaluation and management.  Of note, patient had a lap band done in [facility] in 2011.  He denies chest pain, shortness of breath, fever, chills. ?  ?ED Course:  ?In the emergency department, he was hemodynamically stable.  Work-up in the ED showed normal CBC. BMP showed BUN/creatinine 95/3.42 (baseline creatinine at 1.1-1.6), CBG 279, lipase 38. ?CT abdomen and pelvis without contrast showed Mid small bowel obstruction with point of transition within the left mid abdomen. Underlying adhesions suspected. Superimposed mildly incomplete duodenal rotation. No free intraperitoneal gas or fluid identified. ?He was treated with IV fentanyl and Zofran.  General surgery was consulted and recommended admitting patient with plan to see patient in the morning per ED physician.  Hospitalist was asked to admit patient for further evaluation and management ? ?Assessment and Plan: ?* Small bowel obstruction ([place]) ?-Reporting experiencing once again severe abdominal pain with associated nausea and dry heaving.  Repeat images redemonstrating SBO with concern for malrotation volvulus and internal hernia. ?-Case discussed with general surgery who is planning to take patient to the OR. ?-Replace NG tube and keep patient NPO. ?-Medications to be given through alternate route. ? ?Type 2  diabetes mellitus with hyperglycemia ([place]) ?-With nephropathy complication (chronic kidney disease stage IIIa). ?-Currently advancing diet as tolerated. ?-Continue sliding scale insulin and long-acting insulin ?-Continue to hold oral hypoglycemic agents while inpatient. ?-Initiation of his steroids could potentially increase CBGs; will follow trend and adjust management as required. ? ?AKI (acute kidney injury) ([place]) ?-Patient with underlying history of chronic kidney disease stage IIIa due to diabetes ?-baseline creatinine of 1.1-1.6 ?-After fluid resuscitation and holding nephrotoxic agents creatinine down to 1.5 currently (essentially back to baseline). ?-Will continue to follow renal function trend. ?-Continue minimizing nephrotoxic agents ?-Continue to maintain adequate hydration. ?-In the setting of prerenal azotemia continue to minimize the use of nephrotoxic agents; patient has presented with a peak creatinine of 3.42 and a BUN of 95 at time of admission. ? ?Obesity (BMI 30-39.9) ?-Body mass index is 37.45 kg/m?. ?-Low calorie diet, portion control and increase physical activity discussed with patient. ?-Patient with prior history of lap band placement in 2011. ? ?Abdominal pain ?-In the setting of SBO/volvulus and internal hernia ?-Maintain adequate hydration; NG tube to be replaced.  As needed antiemetics and analgesics will be provided. ?-Follow general surgery recommendation. ? ?Mixed hyperlipidemia ?-Continue statins when able to tolerate p.o.'s again. ?-Heart healthy diet discussed with patient. ? ?Essential hypertension ?-Blood pressure stable/rising. ?-Continue as needed hydralazine ?-Continue IV metoprolol ?-The rest of his antihypertensive agent has been placed on hold for now. ? ?Coronary artery disease, hx LAD and LCX stenting in 2001 & 2004, last cath 2009 with patent stents. ?-No chest pain currently ?-Continue IV metoprolol ?-Aspirin and Plavix on hold until able to tolerate  p.o.'s. ? ?Gout: ?-Continue as needed  analgesics ?-Follow clinical response. ?-Patient reports no prior history of gout; uric acid 11.1 in the presence of acute flare. ?-Continue treatment with steroids and allopurinol. ?-Patient advised to keep himself well-hydrated. ? ?Subjective:  ?Reporting difficulty bearing weight on his left foot due to ongoing ankle pain.  Overnight with acute episode of abdominal pain with associated nausea and dry heaving.  Repeat CT scan redemonstrating SBO with concern for volvulus and internal hernia. ? ?Physical Exam: ?Vitals:  ? 09/16/21 2113 09/17/21 0014 09/17/21 0548 09/17/21 1436  ?BP: (!) 155/75 (!) 158/89 138/74 (!) 141/68  ?Pulse: 69 68 71 80  ?Resp: '18  17 18  '$ ?Temp: 98.5 ?F (36.9 ?C) 97.9 ?F (36.6 ?C) 97.6 ?F (36.4 ?C) 98 ?F (36.7 ?C)  ?TempSrc: Oral Oral Oral   ?SpO2: 97% 98% 100% 99%  ?Weight:      ?Height:      ? ?General exam: Alert, awake, oriented x 3; patient reporting acute excruciating abdominal pain overnight with associated nausea and dry heaving.  This happened after dinner.  Had not had a bowel movement in the last 24 hours.  Able to pass gas this morning.  Continues to complaining of left ankle swelling and pain. ?Respiratory system: Clear to auscultation. Respiratory effort normal.  Good saturation on room air. ?Cardiovascular system:RRR. No murmurs, rubs, gallops.  No JVD. ?Gastrointestinal system: Abdomen is obese nondistended, soft and mildly tender to palpation in midepigastric area. No organomegaly or masses felt.  Decreased bowel sounds appreciated on examination. ?Central nervous system: Alert and oriented. No focal neurological deficits. ?Extremities: No cyanosis or clubbing.  Chronic excessive dermatitis changes appreciated bilaterally.  2+ edema seen (no new for patient).  Actively complaining of pain on palpation of his left ankle currently. ?Skin: No petechiae. ?Psychiatry: Judgement and insight appear normal. Mood & affect appropriate.  ? ? ?Data  Reviewed: ?Uric acid: 11.1 ?Basic metabolic panel demonstrating sodium 141, potassium 4.1, chloride 106, BUN 38, creatinine 1.5 ? ? ?Family Communication: No family at bedside. ? ?Disposition: ?Status is: Inpatient ?Remains inpatient appropriate because: Continue management of SBO and acute on chronic renal failure.. ? ? Planned Discharge Destination: Home ? ? ?Author: ?Barton Dubois, MD ?09/17/2021 4:31 PM ? ?For on call review [email or web address].  ?

## 2021-09-17 NOTE — Telephone Encounter (Signed)
Please add to august recall ?

## 2021-09-17 NOTE — H&P (View-Only) (Signed)
[facility] Consult  Reason for Consult: SBO Referring Physician: Dr. Dyann Kief   Chief Complaint   Abdominal Pain     HPI: Kevin [name] is a 76 y.o. male with recent SBO that improved after gastrografin SBFT and was set to be discharged after being seen by my partner last week. He had a gout flare and stayed longer. He had been eating and having Bms. His last BM was on Sunday and he says that last night he developed acute onset of abdominal pain in the right abdomen and associated nausea. He had a CT scan today that re-demonstrates a SBO.   The patient had a lap band placed in 2011 at [facility]. He took his Plavix on Monday 5/15. He has a history of CAD and stent placement. He had a normal Stress test in 2018.    He really wants his lap band out too at this point as he is worried about it eroding because he had a friend that had this occur.   Past Medical History:  Diagnosis Date   Angio-edema    Asthma    as a child. no problems now.    CAD (coronary artery disease)    Cellulitis 5/11   left leg   Diabetes mellitus    Diverticulosis    Diverticulosis    HTN (hypertension)    Hyperlipidemia    Melanoma ([place]) 10/11   left foot   Melanoma of foot ([place]) 06/02/2011   Microscopic colitis 10/09/10   Colonoscopy dr [name]   Obesity    OSA on CPAP 01/20/2014   Psoriasis    S/P colonoscopy 07/03/03    Dr [name]->hyperplastic rectal polyp   S/P endoscopy 10/09/10   lap band intact, duodenal erosions   Seasonal allergies    Sleep apnea    Urticaria     Past Surgical History:  Procedure Laterality Date   ABDOMINAL SURGERY     ADENOIDECTOMY     APPENDECTOMY     CARDIAC CATHETERIZATION N/A 02/14/2015   Procedure: Left Heart Cath and Coronary Angiography;  Surgeon: Jettie Booze, MD;  Location: [facility];  Service: Cardiovascular;  Laterality: N/A;   CARDIAC CATHETERIZATION N/A 02/14/2015   Procedure: Coronary Stent Intervention;  Surgeon: Jettie Booze, MD;  Location: [facility];  Service: Cardiovascular;  Laterality: N/A;   COLONOSCOPY N/A 10/09/2010   normal rectum, long redundant colon, pancolonic diverticula, 25m cecal polyp, diminutive polyp at the appendiceal orifice ablated with tip of hot snare (Biopsies suggestive of microscopic colitis; polyp benign; small bowel ok)   CORONARY ANGIOPLASTY  2002/2005/2008   CORONARY ANGIOPLASTY WITH STENT PLACEMENT  06/11/2007   PTCA & stenting distal CX, PTCA & balloon angioplasty in-stent restenotic mid CX coronary artery stent   ESOPHAGOGASTRODUODENOSCOPY N/A 10/09/2010   Normal esophagus, lap band present, 2nd portion duodenum with erosions without frank ulcer   KNEE SURGERY     left   LEFT HEART CATHETERIZATION WITH CORONARY ANGIOGRAM N/A 07/15/2013   Procedure: LEFT HEART CATHETERIZATION WITH CORONARY ANGIOGRAM;  Surgeon: HSinclair Grooms MD;  [facility];  Service: Cardiovascular;  Laterality: N/A;   NM MYOCAR PERF WALL MOTION  06/24/2011   small mild fixed anteroseptal & anteroapical defects.   PENILE PROSTHESIS IMPLANT     SHOULDER SURGERY     left   SINOSCOPY     TONSILLECTOMY     TYMPANOSTOMY TUBE PLACEMENT      Family History  Problem Relation Age of Onset   Coronary artery disease Mother    Diabetes Father    Liver cancer Brother 16   Celiac disease Other 2       brother's daughter   Allergic rhinitis Neg Hx    Angioedema Neg Hx    Asthma Neg Hx    Atopy Neg Hx    Eczema Neg Hx    Immunodeficiency Neg Hx    Urticaria Neg Hx     Social History   Tobacco Use   Smoking status: Former    Packs/day: 3.00    Years: 30.00    Pack years: 90.00    Types: Cigarettes    Start date: 11/18/1964    Quit date: 10/04/1994    Years since quitting: 26.9   Smokeless tobacco: Never   Tobacco comments:    quit smoking 19 years ago  Vaping Use   Vaping Use: Never used  Substance Use Topics   Alcohol use: Yes    Alcohol/week: 0.0 standard drinks     Comment: 3-4 times/year   Drug use: No    Medications: I have reviewed the patient's current medications. Prior to Admission:  Medications Prior to Admission  Medication Sig Dispense Refill Last Dose   Ascorbic Acid (VITAMIN C) 1000 MG tablet Take 1,000 mg by mouth daily.   09/10/2021   aspirin 81 MG tablet Take 81 mg by mouth daily.   09/10/2021   clopidogrel (PLAVIX) 75 MG tablet TAKE ONE TABLET BY MOUTH ONCE DAILY. 90 tablet 0 09/10/2021   Coenzyme Q10 (CO Q 10) 10 MG CAPS Take 1 capsule by mouth daily.   09/10/2021   diphenhydrAMINE (BENADRYL) 25 MG tablet Take 25 mg by mouth every 6 (six) hours as needed.   09/10/2021   fluticasone (FLONASE) 50 MCG/ACT nasal spray Place 2 sprays into both nostrils daily.   09/10/2021   furosemide (LASIX) 40 MG tablet Take 40 mg by mouth daily.   09/10/2021   glipiZIDE (GLUCOTROL) 5 MG tablet Take 5 mg by mouth daily as needed.   unknown   levothyroxine (SYNTHROID) 50 MCG tablet Take 50 mcg by mouth daily before breakfast.   09/10/2021   metoprolol (TOPROL-XL) 100 MG 24 hr tablet Take 100 mg by mouth daily.   09/10/2021 at 0600   niacin (NIASPAN) 1000 MG CR tablet TAKE 1 TABLET BY MOUTH AT BEDTIME. 30 tablet 0 09/09/2021   pravastatin (PRAVACHOL) 40 MG tablet Take 1 tablet (40 mg total) by mouth every evening. 90 tablet 3 09/09/2021   RESVERATROL PO Take 1 capsule by mouth every morning.    09/10/2021   sitaGLIPtin (JANUVIA) 100 MG tablet Take 100 mg by mouth daily.   09/10/2021   tamsulosin (FLOMAX) 0.4 MG CAPS capsule Take 0.4 mg by mouth daily.   09/09/2021   valsartan-hydrochlorothiazide (DIOVAN-HCT) 160-12.5 MG per tablet Take 1 tablet by mouth daily.   09/10/2021   Scheduled:  bisacodyl  10 mg Rectal Daily   insulin aspart  0-15 Units Subcutaneous Q4H   insulin glargine-yfgn  15 Units Subcutaneous Daily   methylPREDNISolone (SOLU-MEDROL) injection  40 mg Intravenous Daily   metoprolol tartrate  2.5 mg Intravenous Q8H   pantoprazole  40 mg Oral BID   polyethylene glycol   17 g Oral Daily   Continuous: HAL:[id number], HYDROmorphone (DILAUDID) injection, ondansetron (ZOFRAN) IV  Allergies  Allergen Reactions   Altace [Ramipril] Shortness Of Breath   Avapro [Irbesartan] Shortness Of Breath   Prednisone Anaphylaxis, Swelling and  Other (See Comments)    SWELLING OF THE LIPS   Shellfish Allergy Anaphylaxis    Shrimp, selfish   Tradjenta [Linagliptin] Shortness Of Breath   Cephalexin Hives and Swelling   Clindamycin Hcl Hives and Swelling   Codeine Nausea And Vomiting and Other (See Comments)    AFFECTS HEART   Contrast Media [Iodinated Contrast Media]     Can be administered if Benadryl is administered prior to prevent reaction    Fenofibrate Other (See Comments)    "My skin started peeling off"   Glycotrol [Support-500] Other (See Comments)    unknown   Indomethacin Other (See Comments)    UNKNOWN REACTION   Minocycline Hcl Hives and Swelling   Morphine And Related Nausea And Vomiting    PROJECTILE VOMITING   Sulfa Drugs Cross Reactors Hives and Swelling   Tetracyclines & Related    Tree Extract Itching     ROS:  A comprehensive review of systems was negative except for: Gastrointestinal: positive for abdominal pain and nausea  Blood pressure (!) 141/68, pulse 80, temperature 98 F (36.7 C), resp. rate 18, height 5' 9.5" (1.765 m), weight 116.7 kg, SpO2 99 %. Physical Exam Vitals reviewed.  Constitutional:      Appearance: He is obese.  HENT:     Head: Normocephalic.  Eyes:     Extraocular Movements: Extraocular movements intact.  Cardiovascular:     Rate and Rhythm: Regular rhythm.  Pulmonary:     Effort: Pulmonary effort is normal.  Abdominal:     General: There is distension.     Tenderness: There is abdominal tenderness.     Comments: Right sided abdominal pain, hernia defect at umbilicus, reducible, the gastric lap band port in the epigastric region  Skin:    General: Skin is warm.  Neurological:     General: No focal  deficit present.     Mental Status: He is alert.  Psychiatric:        Mood and Affect: Mood normal.    Results: Results for orders placed or performed during the hospital encounter of 09/10/21 (from the past 48 hour(s))  Glucose, capillary     Status: Abnormal   Collection Time: 09/15/21  4:17 PM  Result Value Ref Range   Glucose-Capillary 156 (H) 70 - 99 mg/dL    Comment: Glucose reference range applies only to samples taken after fasting for at least 8 hours.  Glucose, capillary     Status: Abnormal   Collection Time: 09/15/21  8:58 PM  Result Value Ref Range   Glucose-Capillary 229 (H) 70 - 99 mg/dL    Comment: Glucose reference range applies only to samples taken after fasting for at least 8 hours.  Glucose, capillary     Status: Abnormal   Collection Time: 09/16/21  1:23 AM  Result Value Ref Range   Glucose-Capillary 131 (H) 70 - 99 mg/dL    Comment: Glucose reference range applies only to samples taken after fasting for at least 8 hours.  Basic metabolic panel     Status: Abnormal   Collection Time: 09/16/21  4:55 AM  Result Value Ref Range   Sodium 141 135 - 145 mmol/L   Potassium 4.1 3.5 - 5.1 mmol/L   Chloride 106 98 - 111 mmol/L   CO2 28 22 - 32 mmol/L   Glucose, Bld 110 (H) 70 - 99 mg/dL    Comment: Glucose reference range applies only to samples taken after fasting for at least 8 hours.  BUN 38 (H) 8 - 23 mg/dL   Creatinine, Ser 1.54 (H) 0.61 - 1.24 mg/dL   Calcium 9.3 8.9 - 10.3 mg/dL   GFR, Estimated 47 (L) >60 mL/min    Comment: (NOTE) Calculated using the CKD-EPI Creatinine Equation (2021)    Anion gap 7 5 - 15    Comment: Performed at [facility], [address], [place], [place] [address]  Glucose, capillary     Status: Abnormal   Collection Time: 09/16/21  5:38 AM  Result Value Ref Range   Glucose-Capillary 114 (H) 70 - 99 mg/dL    Comment: Glucose reference range applies only to samples taken after fasting for at least 8 hours.  Glucose, capillary      Status: Abnormal   Collection Time: 09/16/21  7:57 AM  Result Value Ref Range   Glucose-Capillary 100 (H) 70 - 99 mg/dL    Comment: Glucose reference range applies only to samples taken after fasting for at least 8 hours.  Glucose, capillary     Status: Abnormal   Collection Time: 09/16/21 11:46 AM  Result Value Ref Range   Glucose-Capillary 160 (H) 70 - 99 mg/dL    Comment: Glucose reference range applies only to samples taken after fasting for at least 8 hours.  Glucose, capillary     Status: Abnormal   Collection Time: 09/16/21  5:05 PM  Result Value Ref Range   Glucose-Capillary 248 (H) 70 - 99 mg/dL    Comment: Glucose reference range applies only to samples taken after fasting for at least 8 hours.  Glucose, capillary     Status: Abnormal   Collection Time: 09/16/21  8:07 PM  Result Value Ref Range   Glucose-Capillary 273 (H) 70 - 99 mg/dL    Comment: Glucose reference range applies only to samples taken after fasting for at least 8 hours.  Glucose, capillary     Status: Abnormal   Collection Time: 09/17/21 12:12 AM  Result Value Ref Range   Glucose-Capillary 175 (H) 70 - 99 mg/dL    Comment: Glucose reference range applies only to samples taken after fasting for at least 8 hours.  Glucose, capillary     Status: Abnormal   Collection Time: 09/17/21  3:59 AM  Result Value Ref Range   Glucose-Capillary 135 (H) 70 - 99 mg/dL    Comment: Glucose reference range applies only to samples taken after fasting for at least 8 hours.  Glucose, capillary     Status: None   Collection Time: 09/17/21  7:23 AM  Result Value Ref Range   Glucose-Capillary 99 70 - 99 mg/dL    Comment: Glucose reference range applies only to samples taken after fasting for at least 8 hours.  Glucose, capillary     Status: Abnormal   Collection Time: 09/17/21 11:02 AM  Result Value Ref Range   Glucose-Capillary 104 (H) 70 - 99 mg/dL    Comment: Glucose reference range applies only to samples taken after  fasting for at least 8 hours.   Personally reviewed the CT scan from today and this admission and from 2021 and reviewed with Dr. Thornton Papas- he looks like he has a partial malrotation with the duodenum crossing back over to midline to the right immediately and then from [facility] from 2021 to now shows that there is a 180+ degree turn and concern for mesenteric twisting and abrupt transition from the contrast in the small bowel to no contrast, lap band in place without evidence of any issues  CT ABDOMEN PELVIS WO CONTRAST  Result Date: 09/17/2021 CLINICAL DATA:  Acute generalized abdominal pain. EXAM: CT ABDOMEN AND PELVIS WITHOUT CONTRAST TECHNIQUE: Multidetector CT imaging of the abdomen and pelvis was performed following the standard protocol without IV contrast. RADIATION DOSE REDUCTION: This exam was performed according to the departmental dose-optimization program which includes automated exposure control, adjustment of the mA and/or kV according to patient size and/or use of iterative reconstruction technique. COMPARISON:  Sep 10, 2021. FINDINGS: Lower chest: No acute abnormality. Hepatobiliary: No focal liver abnormality is seen. No gallstones, gallbladder wall thickening, or biliary dilatation. Pancreas: Unremarkable. No pancreatic ductal dilatation or surrounding inflammatory changes. Spleen: Stable small calcified cystic lesion is seen in the spleen. No other abnormality is noted. Adrenals/Urinary Tract: Adrenal glands are unremarkable. Kidneys are normal, without renal calculi, focal lesion, or hydronephrosis. Bladder is unremarkable. Stomach/Bowel: Lap band device is again noted and unchanged in position. Stomach is otherwise unremarkable. Stool and contrast are seen throughout the nondilated colon, although contrast may be from previous CT exam. However, there is continued small bowel dilatation with transition zone seen in the pelvis, best seen on image number 69 of series 2. There is seen some twisting of  mesenteric structures and this is concerning for possible malrotation or possible internal hernia. Vascular/Lymphatic: Aortic atherosclerosis. No enlarged abdominal or pelvic lymph nodes. Reproductive: Mild prostatic enlargement is noted. Penile reservoir is noted anteriorly in the pelvis. Other: No abdominal wall hernia or abnormality. No abdominopelvic ascites. Musculoskeletal: No acute or significant osseous findings. IMPRESSION: There is continued small bowel dilatation with transition zone seen in the pelvis. While this may simply be due to adhesion, there is noted twisting of mesenteric structures in this area, and the possibility of closed loop obstruction secondary to malrotation or even internal hernia cannot be excluded as the cause of bowel obstruction. There is noted stool and contrast in the nondilated colon, but potentially this contrast may have been from prior CT scan of May 9th. These results will be called to the ordering clinician or representative by the Radiologist Assistant, and communication documented in the PACS or zVision Dashboard. Aortic Atherosclerosis (ICD10-I70.0). Electronically Signed   By: Marijo Conception M.D.   On: 09/17/2021 12:54     Assessment & Plan:  Kevin [name] is a 76 y.o. male with SBO and concern for some degree of malrotation and volvulus/ internal hernia with mesenteric swirling. Discussed with patient and the team. He had a plavix 5/15. Discussed that we will do surgery on Friday to prevent any unnecessary bleeding and will plan to have platelets available. I do not want to wait until next week due to risk of worsening volvulus and ischemia. Discussed that I will remove his unwanted laparoscopic band at the same time.  -NPO, NG to be replaced -Will plan for Ex lap, LOA possible SBR and lap band removal on Friday   All questions were answered to the satisfaction of the patient. Updated the team.  Discussed with Dr. Okey Dupre who is in agreement with plan.    Greater than 50% of the 50 minute visit was spent in counseling/ coordination of care regarding the patient's prior imaging and concern for volvulus/ internal hernia.   Virl Cagey 09/17/2021, 3:39 PM

## 2021-09-17 NOTE — Progress Notes (Signed)
Notified Dr. Dyann Kief of patient CT results. ?

## 2021-09-17 NOTE — Consult Note (Addendum)
[facility] Consult ? ?Reason for Consult: SBO ?Referring Physician: Dr. Dyann Kief  ? ?Chief Complaint   ?Abdominal Pain ?  ? ? ?HPI: Kevin [name] is a 76 y.o. male with recent SBO that improved after gastrografin SBFT and was set to be discharged after being seen by my partner last week. He had a gout flare and stayed longer. He had been eating and having Bms. His last BM was on Sunday and he says that last night he developed acute onset of abdominal pain in the right abdomen and associated nausea. He had a CT scan today that re-demonstrates a SBO.  ? ?The patient had a lap band placed in 2011 at [facility]. He took his Plavix on Monday 5/15. He has a history of CAD and stent placement. He had a normal Stress test in 2018.   ? ?He really wants his lap band out too at this point as he is worried about it eroding because he had a friend that had this occur.  ? ?Past Medical History:  ?Diagnosis Date  ? Angio-edema   ? Asthma   ? as a child. no problems now.   ? CAD (coronary artery disease)   ? Cellulitis 5/11  ? left leg  ? Diabetes mellitus   ? Diverticulosis   ? Diverticulosis   ? HTN (hypertension)   ? Hyperlipidemia   ? Melanoma ([place]) 10/11  ? left foot  ? Melanoma of foot ([place]) 06/02/2011  ? Microscopic colitis 10/09/10  ? Colonoscopy dr [name]  ? Obesity   ? OSA on CPAP 01/20/2014  ? Psoriasis   ? S/P colonoscopy 07/03/03  ?  Dr [name]->hyperplastic rectal polyp  ? S/P endoscopy 10/09/10  ? lap band intact, duodenal erosions  ? Seasonal allergies   ? Sleep apnea   ? Urticaria   ? ? ?Past Surgical History:  ?Procedure Laterality Date  ? ABDOMINAL SURGERY    ? ADENOIDECTOMY    ? APPENDECTOMY    ? CARDIAC CATHETERIZATION N/A 02/14/2015  ? Procedure: Left Heart Cath and Coronary Angiography;  Surgeon: Jettie Booze, MD;  Location: [place] [facility];  Service: Cardiovascular;  Laterality: N/A;  ? CARDIAC CATHETERIZATION N/A 02/14/2015  ? Procedure: Coronary Stent Intervention;  Surgeon: Jettie Booze, MD;  Location: [place] [facility];  Service: Cardiovascular;  Laterality: N/A;  ? COLONOSCOPY N/A 10/09/2010  ? normal rectum, long redundant colon, pancolonic diverticula, 2m cecal polyp, diminutive polyp at the appendiceal orifice ablated with tip of hot snare (Biopsies suggestive of microscopic colitis; polyp benign; small bowel ok)  ? CORONARY ANGIOPLASTY  2002/2005/2008  ? CORONARY ANGIOPLASTY WITH STENT PLACEMENT  06/11/2007  ? PTCA & stenting distal CX, PTCA & balloon angioplasty in-stent restenotic mid CX coronary artery stent  ? ESOPHAGOGASTRODUODENOSCOPY N/A 10/09/2010  ? Normal esophagus, lap band present, 2nd portion duodenum with erosions without frank ulcer  ? KNEE SURGERY    ? left  ? LEFT HEART CATHETERIZATION WITH CORONARY ANGIOGRAM N/A 07/15/2013  ? Procedure: LEFT HEART CATHETERIZATION WITH CORONARY ANGIOGRAM;  Surgeon: HSinclair Grooms MD;  [facility];  Service: Cardiovascular;  Laterality: N/A;  ? NM MYOCAR PERF WALL MOTION  06/24/2011  ? small mild fixed anteroseptal & anteroapical defects.  ? PENILE PROSTHESIS IMPLANT    ? SHOULDER SURGERY    ? left  ? SINOSCOPY    ? TONSILLECTOMY    ? TYMPANOSTOMY TUBE PLACEMENT    ? ? ?Family History  ?  Problem Relation Age of Onset  ? Coronary artery disease Mother   ? Diabetes Father   ? Liver cancer Brother 29  ? Celiac disease Other 48  ?     brother's daughter  ? Allergic rhinitis Neg Hx   ? Angioedema Neg Hx   ? Asthma Neg Hx   ? Atopy Neg Hx   ? Eczema Neg Hx   ? Immunodeficiency Neg Hx   ? Urticaria Neg Hx   ? ? ?Social History  ? ?Tobacco Use  ? Smoking status: Former  ?  Packs/day: 3.00  ?  Years: 30.00  ?  Pack years: 90.00  ?  Types: Cigarettes  ?  Start date: 11/18/1964  ?  Quit date: 10/04/1994  ?  Years since quitting: 26.9  ? Smokeless tobacco: Never  ? Tobacco comments:  ?  quit smoking 19 years ago  ?Vaping Use  ? Vaping Use: Never used  ?Substance Use Topics  ? Alcohol use: Yes  ?  Alcohol/week: 0.0 standard drinks  ?   Comment: 3-4 times/year  ? Drug use: No  ? ? ?Medications: I have reviewed the patient's current medications. ?Prior to Admission:  ?Medications Prior to Admission  ?Medication Sig Dispense Refill Last Dose  ? Ascorbic Acid (VITAMIN C) 1000 MG tablet Take 1,000 mg by mouth daily.   09/10/2021  ? aspirin 81 MG tablet Take 81 mg by mouth daily.   09/10/2021  ? clopidogrel (PLAVIX) 75 MG tablet TAKE ONE TABLET BY MOUTH ONCE DAILY. 90 tablet 0 09/10/2021  ? Coenzyme Q10 (CO Q 10) 10 MG CAPS Take 1 capsule by mouth daily.   09/10/2021  ? diphenhydrAMINE (BENADRYL) 25 MG tablet Take 25 mg by mouth every 6 (six) hours as needed.   09/10/2021  ? fluticasone (FLONASE) 50 MCG/ACT nasal spray Place 2 sprays into both nostrils daily.   09/10/2021  ? furosemide (LASIX) 40 MG tablet Take 40 mg by mouth daily.   09/10/2021  ? glipiZIDE (GLUCOTROL) 5 MG tablet Take 5 mg by mouth daily as needed.   unknown  ? levothyroxine (SYNTHROID) 50 MCG tablet Take 50 mcg by mouth daily before breakfast.   09/10/2021  ? metoprolol (TOPROL-XL) 100 MG 24 hr tablet Take 100 mg by mouth daily.   09/10/2021 at 0600  ? niacin (NIASPAN) 1000 MG CR tablet TAKE 1 TABLET BY MOUTH AT BEDTIME. 30 tablet 0 09/09/2021  ? pravastatin (PRAVACHOL) 40 MG tablet Take 1 tablet (40 mg total) by mouth every evening. 90 tablet 3 09/09/2021  ? RESVERATROL PO Take 1 capsule by mouth every morning.    09/10/2021  ? sitaGLIPtin (JANUVIA) 100 MG tablet Take 100 mg by mouth daily.   09/10/2021  ? tamsulosin (FLOMAX) 0.4 MG CAPS capsule Take 0.4 mg by mouth daily.   09/09/2021  ? valsartan-hydrochlorothiazide (DIOVAN-HCT) 160-12.5 MG per tablet Take 1 tablet by mouth daily.   09/10/2021  ? ?Scheduled: ? bisacodyl  10 mg Rectal Daily  ? insulin aspart  0-15 Units Subcutaneous Q4H  ? insulin glargine-yfgn  15 Units Subcutaneous Daily  ? methylPREDNISolone (SOLU-MEDROL) injection  40 mg Intravenous Daily  ? metoprolol tartrate  2.5 mg Intravenous Q8H  ? pantoprazole  40 mg Oral BID  ? polyethylene glycol   17 g Oral Daily  ? ?Continuous: ?ERX:[id number], HYDROmorphone (DILAUDID) injection, ondansetron (ZOFRAN) IV ? ?Allergies  ?Allergen Reactions  ? Altace [Ramipril] Shortness Of Breath  ? Avapro [Irbesartan] Shortness Of Breath  ? Prednisone Anaphylaxis, Swelling and  Other (See Comments)  ?  SWELLING OF THE LIPS  ? Shellfish Allergy Anaphylaxis  ?  Shrimp, selfish  ? Tradjenta [Linagliptin] Shortness Of Breath  ? Cephalexin Hives and Swelling  ? Clindamycin Hcl Hives and Swelling  ? Codeine Nausea And Vomiting and Other (See Comments)  ?  AFFECTS HEART  ? Contrast Media [Iodinated Contrast Media]   ?  Can be administered if Benadryl is administered prior to prevent reaction   ? Fenofibrate Other (See Comments)  ?  "My skin started peeling off"  ? Glycotrol [Support-500] Other (See Comments)  ?  unknown  ? Indomethacin Other (See Comments)  ?  UNKNOWN REACTION  ? Minocycline Hcl Hives and Swelling  ? Morphine And Related Nausea And Vomiting  ?  PROJECTILE VOMITING  ? Sulfa Drugs Cross Reactors Hives and Swelling  ? Tetracyclines & Related   ? Tree Extract Itching  ? ? ? ?ROS:  ?A comprehensive review of systems was negative except for: Gastrointestinal: positive for abdominal pain and nausea ? ?Blood pressure (!) 141/68, pulse 80, temperature 98 ?F (36.7 ?C), resp. rate 18, height 5' 9.5" (1.765 m), weight 116.7 kg, SpO2 99 %. ?Physical Exam ?Vitals reviewed.  ?Constitutional:   ?   Appearance: He is obese.  ?HENT:  ?   Head: Normocephalic.  ?Eyes:  ?   Extraocular Movements: Extraocular movements intact.  ?Cardiovascular:  ?   Rate and Rhythm: Regular rhythm.  ?Pulmonary:  ?   Effort: Pulmonary effort is normal.  ?Abdominal:  ?   General: There is distension.  ?   Tenderness: There is abdominal tenderness.  ?   Comments: Right sided abdominal pain, hernia defect at umbilicus, reducible, the gastric lap band port in the epigastric region  ?Skin: ?   General: Skin is warm.  ?Neurological:  ?   General: No focal  deficit present.  ?   Mental Status: He is alert.  ?Psychiatric:     ?   Mood and Affect: Mood normal.  ? ? ?Results: ?Results for orders placed or performed during the hospital encounter of 09/10/21 (from t

## 2021-09-17 NOTE — Progress Notes (Signed)
Patient resting with eyes closed on round. Even and nonlabored respirations. ?

## 2021-09-17 NOTE — Plan of Care (Signed)

## 2021-09-17 NOTE — Progress Notes (Signed)
Patient was reporting quite a bit of pain upon entering his room. Quite upset, wanting to speak to physician team. Notified Dr. Dyann Kief. Patient talking to family on phone who are trying to cheer him up. Patient meds placed to the side for patient as CT scan ordered. Patient reports not wanting to eat, so just told him to go ahead and hold off until after scan anyways.  ?

## 2021-09-17 NOTE — Telephone Encounter (Signed)
We have been unable to reach patient. Looks like he has been in the hospital since 5/9 with SBO. Loma Sousa, please advise regarding timing of scheduling colonoscopy? ?

## 2021-09-17 NOTE — NC FL2 (Signed)
?[place] MEDICAID FL2 LEVEL OF CARE SCREENING TOOL  ?  ? ?IDENTIFICATION  ?Patient Name: ?Kevin [name] Birthdate: 27-Feb-1946 Sex: male Admission Date (Current Location): ?09/10/2021  ?[place] and [place] Number: ? [place] and Address:  ?[facility] [address] [place] ?     Provider Number: ?[id number]  ?Attending Physician Name and Address:  ?Barton Dubois, MD ? Relative Name and Phone Number:  ?  ?   ?Current Level of Care: ?Hospital Recommended Level of Care: ?[place] Prior Approval Number: ?  ? ?Date Approved/Denied: ?  PASRR Number: ?[id number] A ? ?Discharge Plan: ?SNF ?  ? ?Current Diagnoses: ?Patient Active Problem List  ? Diagnosis Date Noted  ? Abdominal pain 09/11/2021  ? Obesity (BMI 30-39.9) 09/11/2021  ? AKI (acute kidney injury) ([place]) 09/11/2021  ? Type 2 diabetes mellitus with hyperglycemia ([place]) 09/11/2021  ? Small bowel obstruction ([place]) 09/10/2021  ? Anaphylactic shock due to adverse food reaction 08/22/2019  ? Dyslipidemia 02/22/2015  ? Acute coronary syndrome ([place]) 02/14/2015  ? NSTEMI (non-ST elevated myocardial infarction) ([place])   ? Chest pain 02/13/2015  ? OSA on CPAP 01/20/2014  ? Accelerating angina ([place]) 07/14/2013  ? Edema, lower extremity, bil 07/14/2013  ? Coronary artery disease, hx LAD and LCX stenting in 2001 & 2004, last cath 2009 with patent stents. 04/07/2013  ? Essential hypertension 04/07/2013  ? Mixed hyperlipidemia 04/07/2013  ? Diabetes ([place]) 04/07/2013  ? Morbid obesity ([place]) 04/07/2013  ? Melanoma of foot ([place]) 06/02/2011  ? Microscopic colitis 10/24/2010  ? GI bleed 10/01/2010  ? Diarrhea 10/01/2010  ? ? ?Orientation RESPIRATION BLADDER Height & Weight   ?  ?Self, Time, Situation, Place ? Normal Continent Weight: 257 lb 4.4 oz (116.7 kg) ?Height:  5' 9.5" (176.5 cm)  ?BEHAVIORAL SYMPTOMS/MOOD NEUROLOGICAL BOWEL NUTRITION STATUS  ?    Continent Diet (See DC summary)  ?AMBULATORY STATUS COMMUNICATION OF NEEDS Skin    ?Extensive Assist Verbally Normal ?  ?  ?  ?    ?     ?     ? ? ?Personal Care Assistance Level of Assistance  ?Bathing, Feeding, Dressing Bathing Assistance: Limited assistance ?Feeding assistance: Independent ?Dressing Assistance: Limited assistance ?   ? ?Functional Limitations Info  ?Sight, Hearing, Speech Sight Info: Adequate ?Hearing Info: Adequate ?Speech Info: Adequate  ? ? ?SPECIAL CARE FACTORS FREQUENCY  ?PT (By licensed PT), OT (By licensed OT)   ?  ?PT Frequency: 5 times weekly ?OT Frequency: 5 times weekly ?  ?  ?  ?   ? ? ?Contractures Contractures Info: Not present  ? ? ?Additional Factors Info  ?Code Status, Allergies Code Status Info: FULL ?Allergies Info: Altace (Ramipril), Avapro (Irbesartan), Prednisone, Shellfish Allergy, Tradjenta (Linagliptin), Cephalexin, Clindamycin Hcl, Codeine, Contrast Media (Iodinated Contrast Media), Fenofibrate, Glycotrol (Support-500), Indomethacin, Minocycline Hcl, Morphine And Related, Sulfa Drugs Cross Reactors, Tetracyclines & Related, Tree Extract ?  ?  ?  ?   ? ?Current Medications (09/17/2021):  This is the current hospital active medication list ?Current Facility-Administered Medications  ?Medication Dose Route Frequency Provider Last Rate Last Admin  ? allopurinol (ZYLOPRIM) tablet 200 mg  200 mg Oral Daily Barton Dubois, MD   200 mg at 09/17/21 0831  ? aspirin chewable tablet 81 mg  81 mg Oral Daily Barton Dubois, MD   81 mg at 09/17/21 3086  ? bisacodyl (DULCOLAX) suppository 10 mg  10 mg Rectal Daily Pappayliou, Catherine A, DO  10 mg at 09/17/21 3419  ? clopidogrel (PLAVIX) tablet 75 mg  75 mg Oral Daily Barton Dubois, MD   75 mg at 09/17/21 0831  ? famotidine (PEPCID) tablet 20 mg  20 mg Oral Daily Barton Dubois, MD   20 mg at 09/17/21 3790  ? furosemide (LASIX) tablet 20 mg  20 mg Oral Daily Barton Dubois, MD   20 mg at 09/17/21 0830  ? hydrALAZINE (APRESOLINE) injection 10 mg  10 mg Intravenous Q8H PRN Barton Dubois, MD      ? HYDROmorphone  (DILAUDID) injection 0.5 mg  0.5 mg Intravenous Q4H PRN Barton Dubois, MD   0.5 mg at 09/17/21 2409  ? insulin aspart (novoLOG) injection 0-15 Units  0-15 Units Subcutaneous Q4H Adefeso, Oladapo, DO   2 Units at 09/17/21 0423  ? insulin glargine-yfgn (SEMGLEE) injection 15 Units  15 Units Subcutaneous Daily Barton Dubois, MD   15 Units at 09/17/21 [phone]  ? levothyroxine (SYNTHROID) tablet 50 mcg  50 mcg Oral Q0600 Barton Dubois, MD   50 mcg at 09/17/21 0558  ? metoprolol succinate (TOPROL-XL) 24 hr tablet 100 mg  100 mg Oral Daily Barton Dubois, MD   100 mg at 09/17/21 0831  ? ondansetron (ZOFRAN) injection 4 mg  4 mg Intravenous Q6H PRN Adefeso, Oladapo, DO   4 mg at 09/14/21 1652  ? oxyCODONE (Oxy IR/ROXICODONE) immediate release tablet 10 mg  10 mg Oral Q6H PRN Barton Dubois, MD   10 mg at 09/15/21 0912  ? pantoprazole (PROTONIX) EC tablet 40 mg  40 mg Oral BID Barton Dubois, MD   40 mg at 09/17/21 0831  ? polyethylene glycol (MIRALAX / GLYCOLAX) packet 17 g  17 g Oral Daily Barton Dubois, MD   17 g at 09/17/21 2992  ? pravastatin (PRAVACHOL) tablet 40 mg  40 mg Oral QPM Barton Dubois, MD   40 mg at 09/16/21 4268  ? predniSONE (DELTASONE) tablet 40 mg  40 mg Oral Q breakfast Barton Dubois, MD   40 mg at 09/17/21 0830  ? tamsulosin (FLOMAX) capsule 0.4 mg  0.4 mg Oral Daily Barton Dubois, MD   0.4 mg at 09/17/21 0831  ? ? ? ?Discharge Medications: ?Please see discharge summary for a list of discharge medications. ? ?Relevant Imaging Results: ? ?Relevant Lab Results: ? ? ?Additional Information ?SSN: [id number] ? ?Iona Beard, LCSWA ? ? ? ? ?

## 2021-09-17 NOTE — Evaluation (Signed)
Physical Therapy Evaluation ?Patient Details ?Name: Kevin [name] ?MRN: [id number] ?DOB: 20-Jul-1945 ?Today's Date: 09/17/2021 ? ?History of Present Illness ? Kevin [name] is a 76 y.o. male with medical history significant of CAD s/p PCI, hypertension, hyperlipidemia, type 2 diabetes with who presents to the emergency department due to abdominal pain and nausea which started yesterday.  Patient complained of sudden onset of abdominal pain which was associated with nausea and dry heaving.  He complained of abdominal distention since onset of symptoms, stated to go to the ED for further evaluation and management.  Of note, patient had a lap band done in [facility] in 2011.  He denies chest pain, shortness of breath, fever, chills. ?  ?Clinical Impression ? Patient presents sitting at chair in room assisted by nursing staff. Patient was able to perform sit to stand transfer with mod assist and RW but was mainly limited with high levels of B LE pain and generalized weakness impacting patient's ability to perform this independently. Patient was able to ambulate 5 feet in room with RW and mod assist, but was limited reporting high amount of pain levels and difficulty weight bearing through each LE with discomfort. Patient attempted to ambulate towards bed to assess bed mobility but was experiencing high pain levels in B LE in which patient was assisted back to chair. Nurse notified of positioning and mobility status.  Patient will benefit from continued skilled physical therapy in hospital and recommended venue below to increase strength, balance, endurance for safe ADLs and gait.  ?   ? ?Recommendations for follow up therapy are one component of a multi-disciplinary discharge planning process, led by the attending physician.  Recommendations may be updated based on patient status, additional functional criteria and insurance authorization. ? ?Follow Up Recommendations Skilled nursing-short term rehab (<3 hours/day) ? ?   ?Assistance Recommended at Discharge PRN  ?Patient can return home with the following ? Help with stairs or ramp for entrance;A lot of help with walking and/or transfers;A little help with bathing/dressing/bathroom ? ?  ?Equipment Recommendations Rolling walker (2 wheels)  ?Recommendations for Other Services ?    ?  ?Functional Status Assessment Patient has had a recent decline in their functional status and demonstrates the ability to make significant improvements in function in a reasonable and predictable amount of time.  ? ?  ?Precautions / Restrictions Precautions ?Precautions: Fall ?Restrictions ?Weight Bearing Restrictions: No  ? ?  ? ?Mobility ? Bed Mobility ?  ?  ?  ?  ?  ?  ?  ?  ?  ? ?Transfers ?Overall transfer level: Needs assistance ?Equipment used: Rolling walker (2 wheels) ?Transfers: Sit to/from Stand ?Sit to Stand: Mod assist ?  ?  ?  ?  ?  ?General transfer comment: Patient was mod assist with sit to stand transfer with RW utilized. Patient needed verbal cueing and motivation due to being limited by fatigue and pain secondary to medical condition. ?  ? ?Ambulation/Gait ?Ambulation/Gait assistance: Mod assist ?Gait Distance (Feet): 5 Feet ?Assistive device: Rolling walker (2 wheels) ?Gait Pattern/deviations: Step-to pattern, Decreased stride length, Decreased step length - right, Decreased step length - left, Narrow base of support, Decreased weight shift to left, Decreased weight shift to right ?Gait velocity: decreased ?  ?  ?General Gait Details: Patient able to ambulate with mod assist and RW, but was mainly limited by LE pain secondary to medical condition, which impacted tolerancea and distance during assessment. ? ?Stairs ?  ?  ?  ?  ?  ? ?  Wheelchair Mobility ?  ? ?Modified Rankin (Stroke Patients Only) ?  ? ?  ? ?Balance Overall balance assessment: Needs assistance ?Sitting-balance support: Bilateral upper extremity supported, Feet supported ?Sitting balance-Leahy Scale: Fair ?Sitting  balance - Comments: Patient able to sit edge of chair ?  ?Standing balance support: Bilateral upper extremity supported, During functional activity, Reliant on assistive device for balance ?Standing balance-Leahy Scale: Poor ?Standing balance comment: Patient demonstrates generalized weakness and increased pain levels impacting patient weight shifting onto each LE causing patient to not be able to tolerate standing balance for periods of time. ?  ?  ?  ?  ?  ?  ?  ?  ?  ?  ?  ?   ? ? ? ?Pertinent Vitals/Pain Pain Assessment ?Pain Assessment: 0-10 ?Pain Score: 8  ?Pain Location: L side stomach region/ feet- 4/10 ?Pain Descriptors / Indicators: Aching, Discomfort ?Pain Intervention(s): Limited activity within patient's tolerance, Monitored during session  ? ? ?Home Living Family/patient expects to be discharged to:: Private residence ?Living Arrangements: Alone ?Available Help at Discharge: Friend(s) ?Type of Home: House ?Home Access: Stairs to enter ?Entrance Stairs-Rails: Left ?Entrance Stairs-Number of Steps: 3 ?Alternate Level Stairs-Number of Steps: 35 ?Home Layout: Two level ?Home Equipment: None ?Additional Comments: Patient reports not having any assistive devices or equipment at home  ?  ?Prior Function Prior Level of Function : Independent/Modified Independent ?  ?  ?  ?  ?  ?  ?Mobility Comments: Patient reports ambulating at home and in community independently prior to visit. Patient also reports driving as well. ?ADLs Comments: Patient reports being able to complete ADL's and functional tasks independently ?  ? ? ?Hand Dominance  ?   ? ?  ?Extremity/Trunk Assessment  ? Upper Extremity Assessment ?Upper Extremity Assessment: Overall WFL for tasks assessed ?  ? ?Lower Extremity Assessment ?Lower Extremity Assessment: Generalized weakness ?  ? ?Cervical / Trunk Assessment ?Cervical / Trunk Assessment: Normal  ?Communication  ? Communication: No difficulties  ?Cognition Arousal/Alertness:  Awake/alert ?Behavior During Therapy: [facility] for tasks assessed/performed ?Overall Cognitive Status: Within Functional Limits for tasks assessed ?  ?  ?  ?  ?  ?  ?  ?  ?  ?  ?  ?  ?  ?  ?  ?  ?  ?  ?  ? ?  ?General Comments   ? ?  ?Exercises    ? ?Assessment/Plan  ?  ?PT Assessment All further PT needs can be met in the next venue of care;Patient needs continued PT services  ?PT Problem List Decreased strength;Decreased activity tolerance;Decreased balance;Decreased mobility;Decreased coordination;Pain ? ?   ?  ?PT Treatment Interventions DME instruction;Gait training;Functional mobility training;Therapeutic exercise;Therapeutic activities;Balance training   ? ?PT Goals (Current goals can be found in the Care Plan section)  ?Acute Rehab PT Goals ?Patient Stated Goal: return home ?PT Goal Formulation: With patient ?Time For Goal Achievement: 10/01/21 ?Potential to Achieve Goals: Fair ? ?  ?Frequency Min 3X/week ?  ? ? ?Co-evaluation   ?  ?  ?  ?  ? ? ?  ?AM-PAC PT "6 Clicks" Mobility  ?Outcome Measure Help needed turning from your back to your side while in a flat bed without using bedrails?: A Little ?Help needed moving from lying on your back to sitting on the side of a flat bed without using bedrails?: A Little ?Help needed moving to and from a bed to a chair (including a wheelchair)?: A Lot ?Help needed standing  up from a chair using your arms (e.g., wheelchair or bedside chair)?: A Lot ?Help needed to walk in hospital room?: A Lot ?Help needed climbing 3-5 steps with a railing? : A Lot ?6 Click Score: 14 ? ?  ?End of Session   ?Activity Tolerance: Patient limited by pain;Patient limited by fatigue;Patient tolerated treatment well ?Patient left: in chair;with call bell/phone within reach ?Nurse Communication: Mobility status ?PT Visit Diagnosis: Unsteadiness on feet (R26.81);Other abnormalities of gait and mobility (R26.89);Muscle weakness (generalized) (M62.81);Pain ?Pain - part of body: Leg ?  ? ?Time:  9794-9971 ?PT Time Calculation (min) (ACUTE ONLY): 24 min ? ? ?Charges:   PT Evaluation ?$PT Eval Moderate Complexity: 1 Mod ?PT Treatments ?$Therapeutic Activity: 23-37 mins ?  ?   ? ? ?1:58 PM, 09/17/21 ?Lestine Box, S/PT  ?

## 2021-09-17 NOTE — Plan of Care (Signed)
?  Problem: Acute Rehab PT Goals(only PT should resolve) ?Goal: Pt Will Go Supine/Side To Sit ?Outcome: Progressing ?Flowsheets (Taken 09/17/2021 1358) ?Pt will go Supine/Side to Sit: ? with min guard assist ? with minimal assist ?Goal: Patient Will Perform Sitting Balance ?Outcome: Progressing ?Flowsheets (Taken 09/17/2021 1358) ?Patient will perform sitting balance: ? with modified independence ? with supervision ?Goal: Patient Will Transfer Sit To/From Stand ?Outcome: Progressing ?Flowsheets (Taken 09/17/2021 1358) ?Patient will transfer sit to/from stand: ? with minimal assist ? with min guard assist ?Goal: Pt Will Transfer Bed To Chair/Chair To Bed ?Outcome: Progressing ?Flowsheets (Taken 09/17/2021 1358) ?Pt will Transfer Bed to Chair/Chair to Bed: ? min guard assist ? with min assist ?Goal: Pt Will Perform Standing Balance Or Pre-Gait ?Outcome: Progressing ?Flowsheets (Taken 09/17/2021 1358) ?Pt will perform standing balance or pre-gait: ? with Supervision ? with min guard assist ?Goal: Pt Will Ambulate ?Outcome: Progressing ?Flowsheets (Taken 09/17/2021 1358) ?Pt will Ambulate: ? 25 feet ? with rolling walker ? with min guard assist ? with minimal assist ? ?2:00 PM, 09/17/21 ?Lestine Box, S/PT  ?  ?

## 2021-09-17 NOTE — TOC Initial Note (Signed)
Transition of Care (TOC) - Initial/Assessment Note  ? ? ?Patient Details  ?Name: Kevin [name] ?MRN: [id number] ?Date of Birth: 08/13/1945 ? ?Transition of Care (TOC) CM/SW Contact:    ?Shade Flood, LCSW ?Phone Number: ?09/17/2021, 11:28 AM ? ?Clinical Narrative:                 ? ?Pt admitted from home. PT recommending SNF rehab at dc. Met with pt at bedside today to assist with dc planning. Reviewed PT recommendations with pt. Pt states he lives alone at home and is independent in ADLs at baseline. Per pt, he does not use any DME at home. He drives and is able to get to appointments and obtain medications as needed. ? ?Per pt, he is not able to walk right now and is agreeable to SNF rehab. CMS provider options reviewed. Will refer as requested. ? ?Pt will need insurance authorization prior to dc. MD stating that pt may be medically stable for dc tomorrow. TOC will follow. ?Expected Discharge Plan: [place] ?Barriers to Discharge: Continued Medical Work up ? ? ?Patient Goals and CMS Choice ?Patient states their goals for this hospitalization and ongoing recovery are:: get better ?CMS Medicare.gov Compare Post Acute Care list provided to:: Patient ?Choice offered to / list presented to : Patient ? ?Expected Discharge Plan and Services ?Expected Discharge Plan: [place] ?In-house Referral: Clinical Social Work ?  ?Post Acute Care Choice: [place] ?Living arrangements for the past 2 months: [place] ?                ?  ?  ?  ?  ?  ?  ?  ?  ?  ?  ? ?Prior Living Arrangements/Services ?Living arrangements for the past 2 months: [place] ?Lives with:: Self ?Patient language and need for interpreter reviewed:: Yes ?Do you feel safe going back to the place where you live?: Yes      ?Need for Family Participation in Patient Care: No (Comment) ?Care giver support system in place?: No (comment) ?  ?Criminal Activity/Legal Involvement Pertinent to Current  Situation/Hospitalization: No - Comment as needed ? ?Activities of Daily Living ?Home Assistive Devices/Equipment: None ?ADL Screening (condition at time of admission) ?Patient's cognitive ability adequate to safely complete daily activities?: Yes ?Is the patient deaf or have difficulty hearing?: No ?Does the patient have difficulty seeing, even when wearing glasses/contacts?: No ?Does the patient have difficulty concentrating, remembering, or making decisions?: No ?Patient able to express need for assistance with ADLs?: Yes ?Does the patient have difficulty dressing or bathing?: No ?Independently performs ADLs?: Yes (appropriate for developmental age) ?Does the patient have difficulty walking or climbing stairs?: Yes ?Weakness of Legs: None ?Weakness of Arms/Hands: None ? ?Permission Sought/Granted ?Permission sought to share information with : Customer service manager ?Permission granted to share information with : Yes, Verbal Permission Granted ?   ? Permission granted to share info w AGENCY: snfs ?   ?   ? ?Emotional Assessment ?Appearance:: Appears stated age ?Attitude/Demeanor/Rapport: Engaged ?Affect (typically observed): Pleasant, Accepting ?Orientation: : Oriented to Self, Oriented to Place, Oriented to  Time, Oriented to Situation ?Alcohol / Substance Use: Not Applicable ?Psych Involvement: No (comment) ? ?Admission diagnosis:  Small bowel obstruction ([place]) [K56.609] ?SBO (small bowel obstruction) ([place]) [K56.609] ?Patient Active Problem List  ? Diagnosis Date Noted  ? Abdominal pain 09/11/2021  ? Obesity (BMI 30-39.9) 09/11/2021  ? AKI (acute kidney injury) ([place]) 09/11/2021  ?  Type 2 diabetes mellitus with hyperglycemia ([place]) 09/11/2021  ? Small bowel obstruction ([place]) 09/10/2021  ? Anaphylactic shock due to adverse food reaction 08/22/2019  ? Dyslipidemia 02/22/2015  ? Acute coronary syndrome ([place]) 02/14/2015  ? NSTEMI (non-ST elevated myocardial infarction) ([place])   ? Chest pain 02/13/2015  ? OSA  on CPAP 01/20/2014  ? Accelerating angina ([place]) 07/14/2013  ? Edema, lower extremity, bil 07/14/2013  ? Coronary artery disease, hx LAD and LCX stenting in 2001 & 2004, last cath 2009 with patent stents. 04/07/2013  ? Essential hypertension 04/07/2013  ? Mixed hyperlipidemia 04/07/2013  ? Diabetes ([place]) 04/07/2013  ? Morbid obesity ([place]) 04/07/2013  ? Melanoma of foot ([place]) 06/02/2011  ? Microscopic colitis 10/24/2010  ? GI bleed 10/01/2010  ? Diarrhea 10/01/2010  ? ?PCP:  Sharilyn Sites, MD ?Pharmacy:   ?[facility] [place] ?[place] [address] ?Phone: [phone] Fax: [phone] ? ? ? ? ?Social Determinants of Health (SDOH) Interventions ?  ? ?Readmission Risk Interventions ? ?  09/17/2021  ? 11:27 AM  ?Readmission Risk Prevention Plan  ?Transportation Screening Complete  ?Home Care Screening Complete  ?Medication Review (RN CM) Complete  ? ? ? ?

## 2021-09-17 NOTE — Progress Notes (Signed)
Patient refused CPAP for tonight.  I told patient that if he changed his mind, to let his nurse know so she could call me. ?

## 2021-09-17 NOTE — Progress Notes (Signed)
Patient informed consent placed on chart. Patient states no nausea at this time, will wait for Dr. Constance Haw tomorrow on NG tube. ?

## 2021-09-18 DIAGNOSIS — N179 Acute kidney failure, unspecified: Secondary | ICD-10-CM | POA: Diagnosis not present

## 2021-09-18 DIAGNOSIS — K56609 Unspecified intestinal obstruction, unspecified as to partial versus complete obstruction: Secondary | ICD-10-CM | POA: Diagnosis not present

## 2021-09-18 DIAGNOSIS — M109 Gout, unspecified: Secondary | ICD-10-CM

## 2021-09-18 DIAGNOSIS — I10 Essential (primary) hypertension: Secondary | ICD-10-CM | POA: Diagnosis not present

## 2021-09-18 LAB — BASIC METABOLIC PANEL
Anion gap: 8 (ref 5–15)
BUN: 34 mg/dL — ABNORMAL HIGH (ref 8–23)
CO2: 27 mmol/L (ref 22–32)
Calcium: 8.9 mg/dL (ref 8.9–10.3)
Chloride: 103 mmol/L (ref 98–111)
Creatinine, Ser: 1.29 mg/dL — ABNORMAL HIGH (ref 0.61–1.24)
GFR, Estimated: 58 mL/min — ABNORMAL LOW (ref >60)
Glucose, Bld: 141 mg/dL — ABNORMAL HIGH (ref 70–99)
Potassium: 4.4 mmol/L (ref 3.5–5.1)
Sodium: 138 mmol/L (ref 135–145)

## 2021-09-18 LAB — CBC
HCT: 33.3 % — ABNORMAL LOW (ref 39.0–52.0)
Hemoglobin: 10.9 g/dL — ABNORMAL LOW (ref 13.0–17.0)
MCH: 31.6 pg (ref 26.0–34.0)
MCHC: 32.7 g/dL (ref 30.0–36.0)
MCV: 96.5 fL (ref 80.0–100.0)
Platelets: 188 10*3/uL (ref 150–400)
RBC: 3.45 MIL/uL — ABNORMAL LOW (ref 4.22–5.81)
RDW: 12.9 % (ref 11.5–15.5)
WBC: 7.1 10*3/uL (ref 4.0–10.5)
nRBC: 0 % (ref 0.0–0.2)

## 2021-09-18 LAB — GLUCOSE, CAPILLARY
Glucose-Capillary: 105 mg/dL — ABNORMAL HIGH (ref 70–99)
Glucose-Capillary: 118 mg/dL — ABNORMAL HIGH (ref 70–99)
Glucose-Capillary: 131 mg/dL — ABNORMAL HIGH (ref 70–99)
Glucose-Capillary: 140 mg/dL — ABNORMAL HIGH (ref 70–99)
Glucose-Capillary: 144 mg/dL — ABNORMAL HIGH (ref 70–99)
Glucose-Capillary: 153 mg/dL — ABNORMAL HIGH (ref 70–99)

## 2021-09-18 MED ORDER — SODIUM CHLORIDE 0.9 % IV SOLN: INTRAVENOUS | Status: DC

## 2021-09-18 MED ORDER — PANTOPRAZOLE SODIUM 40 MG IV SOLR
40.0000 mg | Freq: Two times a day (BID) | INTRAVENOUS | Status: DC
Start: 2021-09-18 — End: 2021-09-23
  Administered 2021-09-18 – 2021-09-22 (×9): 40 mg via INTRAVENOUS
  Filled 2021-09-18 (×10): qty 10

## 2021-09-18 NOTE — TOC Progression Note (Signed)
Transition of Care (TOC) - Progression Note  ? ? ?Patient Details  ?Name: LYDELL MOGA ?MRN: [id number] ?Date of Birth: 1945/09/12 ? ?Transition of Care (TOC) CM/SW Contact  ?Salome Arnt, LCSW ?Phone Number: ?09/18/2021, 11:23 AM ? ?Clinical Narrative:  LCSW provided bed offers and pt chooses [facility]. Facility notified. CMA to updated authorization. However, pt scheduled for surgery on Friday. TOC will continue to follow.    ? ? ? ?Expected Discharge Plan: [place] ?Barriers to Discharge: Continued Medical Work up ? ?Expected Discharge Plan and Services ?Expected Discharge Plan: [place] ?In-house Referral: Clinical Social Work ?  ?Post Acute Care Choice: [place] ?Living arrangements for the past 2 months: [place] ?                ?  ?  ?  ?  ?  ?  ?  ?  ?  ?  ? ? ?Social Determinants of Health (SDOH) Interventions ?  ? ?Readmission Risk Interventions ? ?  09/17/2021  ? 11:27 AM  ?Readmission Risk Prevention Plan  ?Transportation Screening Complete  ?Home Care Screening Complete  ?Medication Review (RN CM) Complete  ? ? ?

## 2021-09-18 NOTE — Hospital Course (Signed)
Kevin [name] is a 76 y.o. male with medical history significant of CAD s/p PCI, hypertension, hyperlipidemia, type 2 diabetes with who presents to the emergency department due to abdominal pain and nausea which started yesterday.  Patient complained of sudden onset of abdominal pain which was associated with nausea and dry heaving.  He complained of abdominal distention since onset of symptoms, stated to go to the ED for further evaluation and management.  Of note, patient had a lap band done in [facility] in 2011.  He denies chest pain, shortness of breath, fever, chills. ?  ?ED Course:  ?In the emergency department, he was hemodynamically stable.  Work-up in the ED showed normal CBC. BMP showed BUN/creatinine 95/3.42 (baseline creatinine at 1.1-1.6), CBG 279, lipase 38. ?CT abdomen and pelvis without contrast showed Mid small bowel obstruction with point of transition within the left mid abdomen. Underlying adhesions suspected. Superimposed mildly incomplete duodenal rotation. No free intraperitoneal gas or fluid identified. ?He was treated with IV fentanyl and Zofran.  General surgery was consulted and recommended admitting patient with plan to see patient in the morning per ED physician.  Hospitalist was asked to admit patient for further evaluation and management ?  ?

## 2021-09-18 NOTE — Progress Notes (Signed)
Denies nausea and abd pain.  C/O bilateral foot pain from gout and receiving 0.5 mg of dilaudid q4 prn.  Stated pain in feet better today since he was able to stand for suppository and stated he would not have been able to stand yesterday due to foot pain.  Edema in feet about at +2 and stated that the edema is not new that is comes and goes.   ?

## 2021-09-18 NOTE — Progress Notes (Signed)
Continues to deny nausea and abd pain except for a twinge every now and then.  Ambulated for second time to bathroom and had small loose bm.   ?

## 2021-09-18 NOTE — Progress Notes (Signed)
?  ?       ?PROGRESS NOTE ? ?KIMM UNGARO UQJ:[id number] DOB: May 09, 1945 DOA: 09/10/2021 ?PCP: Sharilyn Sites, MD ? ?Brief History:  ?Kevin [name] is a 76 y.o. male with medical history significant of CAD s/p PCI, hypertension, hyperlipidemia, type 2 diabetes with who presents to the emergency department due to abdominal pain and nausea which started yesterday.  Patient complained of sudden onset of abdominal pain which was associated with nausea and dry heaving.  He complained of abdominal distention since onset of symptoms, stated to go to the ED for further evaluation and management.  Of note, patient had a lap band done in [facility] in 2011.  He denies chest pain, shortness of breath, fever, chills. ?  ?ED Course:  ?In the emergency department, he was hemodynamically stable.  Work-up in the ED showed normal CBC. BMP showed BUN/creatinine 95/3.42 (baseline creatinine at 1.1-1.6), CBG 279, lipase 38. ?CT abdomen and pelvis without contrast showed Mid small bowel obstruction with point of transition within the left mid abdomen. Underlying adhesions suspected. Superimposed mildly incomplete duodenal rotation. No free intraperitoneal gas or fluid identified. ?He was treated with IV fentanyl and Zofran.  General surgery was consulted and recommended admitting patient with plan to see patient in the morning per ED physician.  Hospitalist was asked to admit patient for further evaluation and management ?   ? ? ?Assessment and Plan: ?* Small bowel obstruction ([place]) ?-Reporting experiencing once again severe abdominal pain with associated nausea and dry heaving.  ?5/16 CT abd--continued small bowel dilatation with transition zone seen ?in the pelvis;  noted twisting of mesenteric structures in this area, and the ?possibility of closed loop obstruction secondary to malrotation or ?even internal hernia cannot be excluded as the cause of bowel ?obstruction ?-Case discussed with general surgery who is planning to take patient  to the OR 5/19 ?-keep patient NPO. ?-Medications to be given through alternate route. ? ?Type 2 diabetes mellitus with hyperglycemia ([place]) ?-With nephropathy complication (chronic kidney disease stage IIIa). ?-Continue sliding scale insulin and long-acting insulin ?-Continue to hold oral hypoglycemic agents while inpatient. ?-Initiation of his steroids could potentially increase CBGs; will follow trend and adjust management as required. ?09/11/21 A1C--7.7 ? ?Acute renal failure superimposed on stage 3a chronic kidney disease ([place]) ?-baseline creatinine of 1.1-1.4 ?-serum creatinine peaked 3.42 ?-Continue minimizing nephrotoxic agents ?-Continue to maintain adequate hydration. ?-In the setting of prerenal azotemia; volume depletion ?-improving with IVF ? ?Obesity (BMI 30-39.9) ?-Body mass index is 37.45 kg/m?. ?-Low calorie diet, portion control and increase physical activity discussed with patient. ?-Patient with prior history of lap band placement in 2011. ? ?Mixed hyperlipidemia ?-Continue statins when able to tolerate p.o.'s again. ?-Heart healthy diet discussed with patient. ? ?Essential hypertension ?-Blood pressure stable ?-Continue as needed hydralazine ?-Continue IV metoprolol ?-The rest of his antihypertensive agent has been placed on hold for now. ? ?Coronary artery disease, hx LAD and LCX stenting in 2001 & 2004, last cath 2009 with patent stents. ?-No chest pain currently ?-Continue IV metoprolol ?-Aspirin and Plavix on hold until able to tolerate p.o.'s. ? ? ? ? ?Family Communication:   no Family at bedside ? ?Consultants:  general surgery ? ?Code Status:  FULL  ? ?DVT Prophylaxis:  [place] Heparin ? ? ?Procedures: ?As Listed in Progress Note Above ? ?Antibiotics: ?None ? ? ? ?Subjective: ?Patient states abd pain better when not taking any po.  Had one bm today.  Denies f/c, cp, sob, abd pain.  Feet pain 50% better ? ?Objective: ?Vitals:  ? 09/17/21 2136 09/18/21 0546 09/18/21 0549 09/18/21 1326  ?BP: 137/67  (!) 143/83  (!) 142/70  ?Pulse: 65 71 73 67  ?Resp: 16  18   ?Temp: 98.2 ?F (36.8 ?C)  98.2 ?F (36.8 ?C) 98 ?F (36.7 ?C)  ?TempSrc: Oral  Oral Oral  ?SpO2: 98%  95% 98%  ?Weight:      ?Height:      ? ? ?Intake/Output Summary (Last 24 hours) at 09/18/2021 1857 ?Last data filed at 09/18/2021 1810 ?Gross per 24 hour  ?Intake 451.21 ml  ?Output --  ?Net 451.21 ml  ? ?Weight change:  ?Exam: ? ?General:  Pt is alert, follows commands appropriately, not in acute distress ?HEENT: No icterus, No thrush, No neck mass, [place]/AT ?Cardiovascular: RRR, S1/S2, no rubs, no gallops ?Respiratory: CTA bilaterally, no wheezing, no crackles, no rhonchi ?Abdomen: Soft/+BS, mild diffuse tender, non distended, no guarding ?Extremities: trace LE edema, No lymphangitis, No petechiae, No rashes, no synovitis ? ? ?Data Reviewed: ?I have personally reviewed following labs and imaging studies ?Basic Metabolic Panel: ?Recent Labs  ?Lab 09/12/21 ?0403 09/13/21 ?0501 09/14/21 ?0448 09/16/21 ?9323 09/18/21 ?5573  ?NA 142 145 143 141 138  ?K 4.2 3.5 4.1 4.1 4.4  ?CL 108 109 107 106 103  ?CO2 '25 27 29 28 27  '$ ?GLUCOSE 179* 130* 131* 110* 141*  ?BUN 87* 67* 51* 38* 34*  ?CREATININE 2.05* 1.54* 1.31* 1.54* 1.29*  ?CALCIUM 9.1 9.0 9.1 9.3 8.9  ?MG 2.3  --   --   --   --   ? ?Liver Function Tests: ?No results for input(s): AST, ALT, ALKPHOS, BILITOT, PROT, ALBUMIN in the last 168 hours. ?No results for input(s): LIPASE, AMYLASE in the last 168 hours. ?No results for input(s): AMMONIA in the last 168 hours. ?Coagulation Profile: ?No results for input(s): INR, PROTIME in the last 168 hours. ?CBC: ?Recent Labs  ?Lab 09/12/21 ?0403 09/13/21 ?0501 09/18/21 ?2202  ?WBC 4.8 5.8 7.1  ?HGB 13.5 12.5* 10.9*  ?HCT 41.8 39.3 33.3*  ?MCV 97.4 99.5 96.5  ?PLT 159 161 188  ? ?Cardiac Enzymes: ?No results for input(s): CKTOTAL, CKMB, CKMBINDEX, TROPONINI in the last 168 hours. ?BNP: ?Invalid input(s): POCBNP ?CBG: ?Recent Labs  ?Lab 09/17/21 ?2341 09/18/21 ?0425 09/18/21 ?5427  09/18/21 ?1131 09/18/21 ?1628  ?GLUCAP 182* 131* 118* 153* 140*  ? ?HbA1C: ?No results for input(s): HGBA1C in the last 72 hours. ?Urine analysis: ?   ?Component Value Date/Time  ? COLORURINE YELLOW 09/11/2021 1500  ? APPEARANCEUR CLEAR 09/11/2021 1500  ? LABSPEC 1.013 09/11/2021 1500  ? PHURINE 5.0 09/11/2021 1500  ? GLUCOSEU 150 (A) 09/11/2021 1500  ? HGBUR NEGATIVE 09/11/2021 1500  ? BILIRUBINUR NEGATIVE 09/11/2021 1500  ? KETONESUR NEGATIVE 09/11/2021 1500  ? PROTEINUR NEGATIVE 09/11/2021 1500  ? UROBILINOGEN 0.2 10/01/2010 1544  ? NITRITE NEGATIVE 09/11/2021 1500  ? LEUKOCYTESUR NEGATIVE 09/11/2021 1500  ? ?Sepsis Labs: ?'@LABRCNTIP'$ (procalcitonin:4,lacticidven:4) ?)No results found for this or any previous visit (from the past 240 hour(s)).  ? ?Scheduled Meds: ? bisacodyl  10 mg Rectal Daily  ? insulin aspart  0-15 Units Subcutaneous Q4H  ? insulin glargine-yfgn  15 Units Subcutaneous Daily  ? methylPREDNISolone (SOLU-MEDROL) injection  40 mg Intravenous Daily  ? metoprolol tartrate  2.5 mg Intravenous Q8H  ? pantoprazole (PROTONIX) IV  40 mg Intravenous Q12H  ? polyethylene glycol  17 g Oral Daily  ? ?Continuous Infusions: ? sodium chloride 75 mL/hr at 09/18/21  1434  ? ? ?Procedures/Studies: ?CT ABDOMEN PELVIS WO CONTRAST ? ?Result Date: 09/17/2021 ?CLINICAL DATA:  Acute generalized abdominal pain. EXAM: CT ABDOMEN AND PELVIS WITHOUT CONTRAST TECHNIQUE: Multidetector CT imaging of the abdomen and pelvis was performed following the standard protocol without IV contrast. RADIATION DOSE REDUCTION: This exam was performed according to the departmental dose-optimization program which includes automated exposure control, adjustment of the mA and/or kV according to patient size and/or use of iterative reconstruction technique. COMPARISON:  Sep 10, 2021. FINDINGS: Lower chest: No acute abnormality. Hepatobiliary: No focal liver abnormality is seen. No gallstones, gallbladder wall thickening, or biliary dilatation.  Pancreas: Unremarkable. No pancreatic ductal dilatation or surrounding inflammatory changes. Spleen: Stable small calcified cystic lesion is seen in the spleen. No other abnormality is noted. Adrenals/Uri

## 2021-09-18 NOTE — Progress Notes (Signed)
[facility] Progress Note ? ?   ?Subjective: ?Had a BM with suppository. No nausea. Feeling ok. Says he gets some twinges of pain. Reviewed CT with him and demonstrated the volvulus.  ? ?Objective: ?Vital signs in last 24 hours: ?Temp:  [98 ?F (36.7 ?C)-98.2 ?F (36.8 ?C)] 98 ?F (36.7 ?C) (05/17 1326) ?Pulse Rate:  [65-80] 67 (05/17 1326) ?Resp:  [16-18] 18 (05/17 0549) ?BP: (137-143)/(67-83) 142/70 (05/17 1326) ?SpO2:  [95 %-99 %] 98 % (05/17 1326) ?Last BM Date : 09/18/21 ? ?Intake/Output from previous day: ?No intake/output data recorded. ?Intake/Output this shift: ?No intake/output data recorded. ? ?General appearance: alert and no distress ?GI: soft, distended, nontender  ? ?Lab Results:  ?Recent Labs  ?  09/18/21 ?5681  ?WBC 7.1  ?HGB 10.9*  ?HCT 33.3*  ?PLT 188  ? ?BMET ?Recent Labs  ?  09/16/21 ?0455 09/18/21 ?2751  ?NA 141 138  ?K 4.1 4.4  ?CL 106 103  ?CO2 28 27  ?GLUCOSE 110* 141*  ?BUN 38* 34*  ?CREATININE 1.54* 1.29*  ?CALCIUM 9.3 8.9  ? ?PT/INR ?No results for input(s): LABPROT, INR in the last 72 hours. ? ?Studies/Results: ?CT ABDOMEN PELVIS WO CONTRAST ? ?Result Date: 09/17/2021 ?CLINICAL DATA:  Acute generalized abdominal pain. EXAM: CT ABDOMEN AND PELVIS WITHOUT CONTRAST TECHNIQUE: Multidetector CT imaging of the abdomen and pelvis was performed following the standard protocol without IV contrast. RADIATION DOSE REDUCTION: This exam was performed according to the departmental dose-optimization program which includes automated exposure control, adjustment of the mA and/or kV according to patient size and/or use of iterative reconstruction technique. COMPARISON:  Sep 10, 2021. FINDINGS: Lower chest: No acute abnormality. Hepatobiliary: No focal liver abnormality is seen. No gallstones, gallbladder wall thickening, or biliary dilatation. Pancreas: Unremarkable. No pancreatic ductal dilatation or surrounding inflammatory changes. Spleen: Stable small calcified cystic lesion is seen in  the spleen. No other abnormality is noted. Adrenals/Urinary Tract: Adrenal glands are unremarkable. Kidneys are normal, without renal calculi, focal lesion, or hydronephrosis. Bladder is unremarkable. Stomach/Bowel: Lap band device is again noted and unchanged in position. Stomach is otherwise unremarkable. Stool and contrast are seen throughout the nondilated colon, although contrast may be from previous CT exam. However, there is continued small bowel dilatation with transition zone seen in the pelvis, best seen on image number 69 of series 2. There is seen some twisting of mesenteric structures and this is concerning for possible malrotation or possible internal hernia. Vascular/Lymphatic: Aortic atherosclerosis. No enlarged abdominal or pelvic lymph nodes. Reproductive: Mild prostatic enlargement is noted. Penile reservoir is noted anteriorly in the pelvis. Other: No abdominal wall hernia or abnormality. No abdominopelvic ascites. Musculoskeletal: No acute or significant osseous findings. IMPRESSION: There is continued small bowel dilatation with transition zone seen in the pelvis. While this may simply be due to adhesion, there is noted twisting of mesenteric structures in this area, and the possibility of closed loop obstruction secondary to malrotation or even internal hernia cannot be excluded as the cause of bowel obstruction. There is noted stool and contrast in the nondilated colon, but potentially this contrast may have been from prior CT scan of May 9th. These results will be called to the ordering clinician or representative by the Radiologist Assistant, and communication documented in the PACS or zVision Dashboard. Aortic Atherosclerosis (ICD10-I70.0). Electronically Signed   By: Marijo Conception M.D.   On: 09/17/2021 12:54   ? ?Anti-infectives: ?Anti-infectives (From admission, onward)  ? ? None  ? ?  ? ? ?  Assessment/Plan: ?Patient with volvulus versus internal hernia, SBO. Doing fair. No vomiting or  nausea. I think he is intermittently obstructed but this is not going to resolve.  ?Ex lap, plan for lysis of adhesions, reduction of volvulus, internal hernia, and removal of lap band Friday. ?Will cross for platelets for Friday given the plavix use.  ?Will discuss further tomorrow  ? ? LOS: 8 days  ? ? ?Kevin [name] ?09/18/2021 ? ?

## 2021-09-18 NOTE — Progress Notes (Signed)
Physical Therapy Treatment ?Patient Details ?Name: Kevin [name] ?MRN: [id number] ?DOB: Dec 04, 1945 ?Today's Date: 09/18/2021 ? ? ?History of Present Illness Kevin [name] is a 76 y.o. male with medical history significant of CAD s/p PCI, hypertension, hyperlipidemia, type 2 diabetes with who presents to the emergency department due to abdominal pain and nausea which started yesterday.  Patient complained of sudden onset of abdominal pain which was associated with nausea and dry heaving.  He complained of abdominal distention since onset of symptoms, stated to go to the ED for further evaluation and management.  Of note, patient had a lap band done in [facility] in 2011.  He denies chest pain, shortness of breath, fever, chills. ? ?  ?PT Comments  ? ? Patient was able to perform sit to stand transfer independently with RW, but still needed min guard and supervision for safety. Patient was able to tolerate pre-gait therapeutic exercises but experiences fatigue resulting in technical form breakdown and needing motivation to participate. Patient was able to ambulate for 20 feet with RW and contact guard assist with primarily being limited by fatigue and LE discomfort/pain secondary to medical complications. SNF may still be next recommended venue of care depending on how patient is progressing during stay. Patient will benefit from continued skilled physical therapy in hospital and recommended venue below to increase strength, balance, endurance for safe ADLs and gait.  ?  ?Recommendations for follow up therapy are one component of a multi-disciplinary discharge planning process, led by the attending physician.  Recommendations may be updated based on patient status, additional functional criteria and insurance authorization. ? ?Follow Up Recommendations ? Skilled nursing-short term rehab (<3 hours/day) ?  ?  ?Assistance Recommended at Discharge PRN  ?Patient can return home with the following Help with stairs or ramp for  entrance;A lot of help with walking and/or transfers;A little help with bathing/dressing/bathroom ?  ?Equipment Recommendations ? Rolling walker (2 wheels)  ?  ?Recommendations for Other Services   ? ? ?  ?Precautions / Restrictions Precautions ?Precautions: Fall ?Restrictions ?Weight Bearing Restrictions: No  ?  ? ?Mobility ? Bed Mobility ?  ?  ?  ?  ?  ?  ?  ?  ?  ? ?Transfers ?Overall transfer level: Needs assistance ?Equipment used: Rolling walker (2 wheels) ?Transfers: Sit to/from Stand ?Sit to Stand: Supervision, Min guard ?  ?  ?  ?  ?  ?General transfer comment: Patient was able to perform sit to stand transfer independently today with RW, but still required min guarding and supervision assist. ?  ? ?Ambulation/Gait ?Ambulation/Gait assistance: Min guard, Supervision ?Gait Distance (Feet): 25 Feet ?Assistive device: Rolling walker (2 wheels) ?Gait Pattern/deviations: Step-to pattern, Decreased stride length, Decreased step length - right, Decreased step length - left, Narrow base of support, Decreased weight shift to left, Decreased weight shift to right ?Gait velocity: decreased ?  ?  ?General Gait Details: Patient was able to ambulate into hallway for 25 feet with min guarding and supervision with RW. Patient was limited by LE pain but was able to tolerate ambulating further compared to intial evaluation. ? ? ?Stairs ?  ?  ?  ?  ?  ? ? ?Wheelchair Mobility ?  ? ?Modified Rankin (Stroke Patients Only) ?  ? ? ?  ?Balance Overall balance assessment: Needs assistance ?Sitting-balance support: Bilateral upper extremity supported, Feet supported ?Sitting balance-Leahy Scale: Fair ?Sitting balance - Comments: Patient able to sit edge of chair ?  ?Standing balance support: Bilateral  upper extremity supported, During functional activity, Reliant on assistive device for balance ?Standing balance-Leahy Scale: Fair ?Standing balance comment: Patient progressed with ability to perform standing balance, but still  experiences generalized fatigue and coordination deficits impacting balancing ability. Patient demonstrtates ability to weight shift better compared to intial evaluation. ?  ?  ?  ?  ?  ?  ?  ?  ?  ?  ?  ?  ? ?  ?Cognition Arousal/Alertness: Awake/alert ?Behavior During Therapy: [facility] for tasks assessed/performed ?Overall Cognitive Status: Within Functional Limits for tasks assessed ?  ?  ?  ?  ?  ?  ?  ?  ?  ?  ?  ?  ?  ?  ?  ?  ?  ?  ?  ? ?  ?Exercises General Exercises - Lower Extremity ?Long Arc Quad: AROM, 10 reps, Both, Seated ?Hip Flexion/Marching: AROM, 10 reps, Both, Seated ?Toe Raises: AROM, Both, 15 reps, Seated ?Heel Raises: AROM, Both, 15 reps, Seated ? ?  ?General Comments   ?  ?  ? ?Pertinent Vitals/Pain Pain Assessment ?Pain Assessment: Faces ?Faces Pain Scale: Hurts a little bit ?Pain Descriptors / Indicators: Aching, Discomfort ?Pain Intervention(s): Limited activity within patient's tolerance, Monitored during session, Repositioned  ? ? ?Home Living   ?  ?  ?  ?  ?  ?  ?  ?  ?  ?   ?  ?Prior Function    ?  ?  ?   ? ?PT Goals (current goals can now be found in the care plan section) Acute Rehab PT Goals ?Patient Stated Goal: return home ?PT Goal Formulation: With patient ?Time For Goal Achievement: 10/01/21 ?Potential to Achieve Goals: Fair ?Progress towards PT goals: Progressing toward goals ? ?  ?Frequency ? ? ? Min 3X/week ? ? ? ?  ?PT Plan Current plan remains appropriate  ? ? ?Co-evaluation   ?  ?  ?  ?  ? ?  ?AM-PAC PT "6 Clicks" Mobility   ?Outcome Measure ? Help needed turning from your back to your side while in a flat bed without using bedrails?: A Little ?Help needed moving from lying on your back to sitting on the side of a flat bed without using bedrails?: A Little ?Help needed moving to and from a bed to a chair (including a wheelchair)?: A Little ?Help needed standing up from a chair using your arms (e.g., wheelchair or bedside chair)?: A Little ?Help needed to walk in hospital room?:  A Little ?Help needed climbing 3-5 steps with a railing? : A Lot ?6 Click Score: 17 ? ?  ?End of Session   ?Activity Tolerance: Patient limited by pain;Patient limited by fatigue;Patient tolerated treatment well ?Patient left: in chair;with call bell/phone within reach ?Nurse Communication: Mobility status ?PT Visit Diagnosis: Unsteadiness on feet (R26.81);Other abnormalities of gait and mobility (R26.89);Muscle weakness (generalized) (M62.81);Pain ?  ? ? ?Time: 4580-9983 ?PT Time Calculation (min) (ACUTE ONLY): 16 min ? ?Charges:  $Therapeutic Activity: 8-22 mins          ?          ? ?4:01 PM, 09/18/21 ?Lestine Box, S/PT  ? ?

## 2021-09-18 NOTE — Assessment & Plan Note (Addendum)
Improving with steroids started on 5/15 Repeat uric acid 5/22--7.9 Received 6 days of steroids during hospitalization Overall improved

## 2021-09-19 DIAGNOSIS — M109 Gout, unspecified: Secondary | ICD-10-CM | POA: Diagnosis not present

## 2021-09-19 DIAGNOSIS — N1831 Chronic kidney disease, stage 3a: Secondary | ICD-10-CM | POA: Diagnosis not present

## 2021-09-19 DIAGNOSIS — N179 Acute kidney failure, unspecified: Secondary | ICD-10-CM | POA: Diagnosis not present

## 2021-09-19 DIAGNOSIS — K56609 Unspecified intestinal obstruction, unspecified as to partial versus complete obstruction: Secondary | ICD-10-CM | POA: Diagnosis not present

## 2021-09-19 LAB — PREPARE RBC (CROSSMATCH)

## 2021-09-19 LAB — GLUCOSE, CAPILLARY
Glucose-Capillary: 82 mg/dL (ref 70–99)
Glucose-Capillary: 79 mg/dL (ref 70–99)
Glucose-Capillary: 104 mg/dL — ABNORMAL HIGH (ref 70–99)
Glucose-Capillary: 130 mg/dL — ABNORMAL HIGH (ref 70–99)
Glucose-Capillary: 124 mg/dL — ABNORMAL HIGH (ref 70–99)

## 2021-09-19 LAB — ABO/RH: ABO/RH(D): A POS

## 2021-09-19 MED ORDER — CHLORHEXIDINE GLUCONATE CLOTH 2 % EX PADS
6.0000 | MEDICATED_PAD | Freq: Once | CUTANEOUS | Status: AC
  Administered 2021-09-19: 6 via TOPICAL

## 2021-09-19 MED ORDER — CIPROFLOXACIN IN D5W 400 MG/200ML IV SOLN
400.0000 mg | INTRAVENOUS | Status: AC
  Administered 2021-09-20: 400 mg via INTRAVENOUS
  Filled 2021-09-19: qty 200

## 2021-09-19 MED ORDER — METRONIDAZOLE 500 MG/100ML IV SOLN
500.0000 mg | INTRAVENOUS | Status: AC
  Administered 2021-09-20: 500 mg via INTRAVENOUS
  Filled 2021-09-19: qty 100

## 2021-09-19 MED ORDER — CHLORHEXIDINE GLUCONATE CLOTH 2 % EX PADS: 6.0000 | MEDICATED_PAD | Freq: Once | CUTANEOUS | Status: DC

## 2021-09-19 MED ORDER — INSULIN GLARGINE-YFGN 100 UNIT/ML ~~LOC~~ SOLN
5.0000 [IU] | Freq: Every day | SUBCUTANEOUS | Status: DC
  Administered 2021-09-19: 5 [IU] via SUBCUTANEOUS
  Filled 2021-09-19 (×2): qty 0.05

## 2021-09-19 NOTE — Progress Notes (Addendum)
[facility] Progress Note     Subjective: Patient seen and evaluated. He reports doing about the same as yesterday. He says that he has intermittent twinges of abdominal pain. He denies any nausea or vomiting. His main concern is foot and ankle pain due to gout.  Objective: Vital signs in last 24 hours: Temp:  [97.6 F (36.4 C)-98.3 F (36.8 C)] 97.6 F (36.4 C) (05/18 0417) Pulse Rate:  [61-70] 70 (05/18 0417) Resp:  [18-20] 18 (05/18 0417) BP: (142-164)/(70-86) 149/71 (05/18 0417) SpO2:  [98 %-100 %] 98 % (05/18 0417) Last BM Date : 09/18/21  Intake/Output from previous day: 05/17 0701 - 05/18 0700 In: 1196.7 [P.O.:240; I.V.:956.7] Out: -  Intake/Output this shift: No intake/output data recorded.  Physical Exam Constitutional: Awake, pleasant. Sitting in chair. No acute distress. Abdomen: Soft, non-distended. Non-tender. Lap band button in place in upper abdomen. MSK: Moves all extremities.  Skin: Warm and dry.  Neuro:AAOx3. No focal deficits on exam. Psych: Normal mood and affect.  Lab Results:  Recent Labs    09/18/21 0528  WBC 7.1  HGB 10.9*  HCT 33.3*  PLT 188   BMET Recent Labs    09/18/21 0528  NA 138  K 4.4  CL 103  CO2 27  GLUCOSE 141*  BUN 34*  CREATININE 1.29*  CALCIUM 8.9   PT/INR No results for input(s): LABPROT, INR in the last 72 hours.  Studies/Results: CT ABDOMEN PELVIS WO CONTRAST  Result Date: 09/17/2021 CLINICAL DATA:  Acute generalized abdominal pain. EXAM: CT ABDOMEN AND PELVIS WITHOUT CONTRAST TECHNIQUE: Multidetector CT imaging of the abdomen and pelvis was performed following the standard protocol without IV contrast. RADIATION DOSE REDUCTION: This exam was performed according to the departmental dose-optimization program which includes automated exposure control, adjustment of the mA and/or kV according to patient size and/or use of iterative reconstruction technique. COMPARISON:  Sep 10, 2021. FINDINGS: Lower  chest: No acute abnormality. Hepatobiliary: No focal liver abnormality is seen. No gallstones, gallbladder wall thickening, or biliary dilatation. Pancreas: Unremarkable. No pancreatic ductal dilatation or surrounding inflammatory changes. Spleen: Stable small calcified cystic lesion is seen in the spleen. No other abnormality is noted. Adrenals/Urinary Tract: Adrenal glands are unremarkable. Kidneys are normal, without renal calculi, focal lesion, or hydronephrosis. Bladder is unremarkable. Stomach/Bowel: Lap band device is again noted and unchanged in position. Stomach is otherwise unremarkable. Stool and contrast are seen throughout the nondilated colon, although contrast may be from previous CT exam. However, there is continued small bowel dilatation with transition zone seen in the pelvis, best seen on image number 69 of series 2. There is seen some twisting of mesenteric structures and this is concerning for possible malrotation or possible internal hernia. Vascular/Lymphatic: Aortic atherosclerosis. No enlarged abdominal or pelvic lymph nodes. Reproductive: Mild prostatic enlargement is noted. Penile reservoir is noted anteriorly in the pelvis. Other: No abdominal wall hernia or abnormality. No abdominopelvic ascites. Musculoskeletal: No acute or significant osseous findings. IMPRESSION: There is continued small bowel dilatation with transition zone seen in the pelvis. While this may simply be due to adhesion, there is noted twisting of mesenteric structures in this area, and the possibility of closed loop obstruction secondary to malrotation or even internal hernia cannot be excluded as the cause of bowel obstruction. There is noted stool and contrast in the nondilated colon, but potentially this contrast may have been from prior CT scan of May 9th. These results will be called to the ordering clinician or representative by the  Psychologist, clinical, and communication documented in the PACS or zVision  Dashboard. Aortic Atherosclerosis (ICD10-I70.0). Electronically Signed   By: Marijo Conception M.D.   On: 09/17/2021 12:54    Assessment/Plan:  Kevin [name] is a 76 y.o male with SBO and volvulus vs internal hernia seen on CT.   - Plan for Exploratory Laparotomy, lysis of adhesions, reduction of volvulus, internal hernia, and removal of lap band tomorrow - Type and cross, last dose of Plavix was 5/15 - Diet NPO after midnight   LOS: 9 days    Kevin [name], Medical Student 09/19/2021

## 2021-09-20 ENCOUNTER — Encounter (HOSPITAL_COMMUNITY): Payer: Self-pay | Admitting: Internal Medicine

## 2021-09-20 ENCOUNTER — Other Ambulatory Visit: Payer: Self-pay

## 2021-09-20 ENCOUNTER — Inpatient Hospital Stay (HOSPITAL_COMMUNITY): Payer: Medicare PPO | Admitting: Anesthesiology

## 2021-09-20 ENCOUNTER — Encounter (HOSPITAL_COMMUNITY)
Admission: EM | Disposition: A | Discharge status: Skilled Nursing Facility | Payer: Self-pay | Source: Home / Self Care | Attending: Internal Medicine

## 2021-09-20 DIAGNOSIS — K56609 Unspecified intestinal obstruction, unspecified as to partial versus complete obstruction: Secondary | ICD-10-CM | POA: Diagnosis not present

## 2021-09-20 DIAGNOSIS — K562 Volvulus: Secondary | ICD-10-CM

## 2021-09-20 DIAGNOSIS — Z8719 Personal history of other diseases of the digestive system: Secondary | ICD-10-CM

## 2021-09-20 DIAGNOSIS — Z9884 Bariatric surgery status: Secondary | ICD-10-CM

## 2021-09-20 DIAGNOSIS — N179 Acute kidney failure, unspecified: Secondary | ICD-10-CM | POA: Diagnosis not present

## 2021-09-20 DIAGNOSIS — I10 Essential (primary) hypertension: Secondary | ICD-10-CM | POA: Diagnosis not present

## 2021-09-20 DIAGNOSIS — Z4651 Encounter for fitting and adjustment of gastric lap band: Secondary | ICD-10-CM

## 2021-09-20 DIAGNOSIS — M109 Gout, unspecified: Secondary | ICD-10-CM | POA: Diagnosis not present

## 2021-09-20 DIAGNOSIS — I2510 Atherosclerotic heart disease of native coronary artery without angina pectoris: Secondary | ICD-10-CM

## 2021-09-20 HISTORY — PX: GASTRIC BANDING PORT REVISION: SHX5246

## 2021-09-20 HISTORY — DX: Personal history of other diseases of the digestive system: Z87.19

## 2021-09-20 HISTORY — PX: LAPAROTOMY: SHX154

## 2021-09-20 LAB — URINALYSIS, COMPLETE (UACMP) WITH MICROSCOPIC
Bacteria, UA: NONE SEEN
Bilirubin Urine: NEGATIVE
Glucose, UA: 150 mg/dL — AB
Ketones, ur: 5 mg/dL — AB
Leukocytes,Ua: NEGATIVE
Nitrite: NEGATIVE
Protein, ur: 30 mg/dL — AB
RBC / HPF: >50 RBC/hpf — ABNORMAL HIGH (ref 0–5)
Specific Gravity, Urine: 1.015 (ref 1.005–1.030)
WBC, UA: >50 WBC/hpf — ABNORMAL HIGH (ref 0–5)
pH: 5 (ref 5.0–8.0)

## 2021-09-20 LAB — GLUCOSE, CAPILLARY
Glucose-Capillary: 105 mg/dL — ABNORMAL HIGH (ref 70–99)
Glucose-Capillary: 130 mg/dL — ABNORMAL HIGH (ref 70–99)
Glucose-Capillary: 133 mg/dL — ABNORMAL HIGH (ref 70–99)
Glucose-Capillary: 159 mg/dL — ABNORMAL HIGH (ref 70–99)
Glucose-Capillary: 59 mg/dL — ABNORMAL LOW (ref 70–99)
Glucose-Capillary: 72 mg/dL (ref 70–99)
Glucose-Capillary: 89 mg/dL (ref 70–99)

## 2021-09-20 SURGERY — LAPAROTOMY, EXPLORATORY
Anesthesia: General | Site: Abdomen

## 2021-09-20 MED ORDER — HYDROMORPHONE HCL 1 MG/ML IJ SOLN
0.2500 mg | INTRAMUSCULAR | Status: DC | PRN
  Administered 2021-09-20 (×3): 0.5 mg via INTRAVENOUS
  Filled 2021-09-20 (×3): qty 0.5

## 2021-09-20 MED ORDER — DEXTROSE 50 % IV SOLN
25.0000 mL | Freq: Once | INTRAVENOUS | Status: AC
  Administered 2021-09-20: 25 mL via INTRAVENOUS

## 2021-09-20 MED ORDER — SODIUM CHLORIDE 0.9 % IR SOLN
Status: DC | PRN
  Administered 2021-09-20: 2000 mL

## 2021-09-20 MED ORDER — LACTATED RINGERS IV BOLUS
1000.0000 mL | Freq: Once | INTRAVENOUS | Status: AC
  Administered 2021-09-20: 1000 mL via INTRAVENOUS

## 2021-09-20 MED ORDER — METOPROLOL TARTRATE 5 MG/5ML IV SOLN
2.5000 mg | Freq: Four times a day (QID) | INTRAVENOUS | Status: AC
  Administered 2021-09-20 – 2021-09-22 (×9): 2.5 mg via INTRAVENOUS
  Filled 2021-09-20 (×8): qty 5

## 2021-09-20 MED ORDER — PHENYLEPHRINE HCL-NACL 20-0.9 MG/250ML-% IV SOLN
INTRAVENOUS | Status: AC
  Filled 2021-09-20: qty 250

## 2021-09-20 MED ORDER — DEXTROSE 50 % IV SOLN
INTRAVENOUS | Status: AC
Start: 2021-09-20 — End: 2021-09-20
  Filled 2021-09-20: qty 50

## 2021-09-20 MED ORDER — HYDROMORPHONE HCL 1 MG/ML IJ SOLN: 0.5000 mg | Freq: Once | INTRAMUSCULAR | Status: DC

## 2021-09-20 MED ORDER — ONDANSETRON HCL 4 MG/2ML IJ SOLN
INTRAMUSCULAR | Status: DC | PRN
  Administered 2021-09-20: 4 mg via INTRAVENOUS

## 2021-09-20 MED ORDER — DOCUSATE SODIUM 100 MG PO CAPS
100.0000 mg | ORAL_CAPSULE | Freq: Two times a day (BID) | ORAL | Status: DC
  Administered 2021-09-20 – 2021-09-23 (×7): 100 mg via ORAL
  Filled 2021-09-20 (×7): qty 1

## 2021-09-20 MED ORDER — PROPOFOL 10 MG/ML IV BOLUS
INTRAVENOUS | Status: DC | PRN
  Administered 2021-09-20: 50 mg via INTRAVENOUS

## 2021-09-20 MED ORDER — TRAMADOL HCL 50 MG PO TABS
50.0000 mg | ORAL_TABLET | Freq: Four times a day (QID) | ORAL | Status: DC | PRN
  Administered 2021-09-20 – 2021-09-22 (×2): 50 mg via ORAL
  Filled 2021-09-20 (×2): qty 1

## 2021-09-20 MED ORDER — SEVOFLURANE IN SOLN
RESPIRATORY_TRACT | Status: AC
  Filled 2021-09-20: qty 250

## 2021-09-20 MED ORDER — SUCCINYLCHOLINE CHLORIDE 200 MG/10ML IV SOSY
PREFILLED_SYRINGE | INTRAVENOUS | Status: AC
  Filled 2021-09-20: qty 10

## 2021-09-20 MED ORDER — ROCURONIUM BROMIDE 10 MG/ML (PF) SYRINGE
PREFILLED_SYRINGE | INTRAVENOUS | Status: AC
  Filled 2021-09-20: qty 10

## 2021-09-20 MED ORDER — KETAMINE HCL 50 MG/5ML IJ SOSY
PREFILLED_SYRINGE | INTRAMUSCULAR | Status: AC
  Filled 2021-09-20: qty 5

## 2021-09-20 MED ORDER — DEXTROSE 50 % IV SOLN
50.0000 mL | Freq: Once | INTRAVENOUS | Status: AC
Start: 2021-09-20 — End: 2021-09-20
  Administered 2021-09-20: 50 mL via INTRAVENOUS

## 2021-09-20 MED ORDER — VECURONIUM BROMIDE 10 MG IV SOLR
INTRAVENOUS | Status: DC | PRN
Start: 2021-09-20 — End: 2021-09-20
  Administered 2021-09-20: 2 mg via INTRAVENOUS
  Administered 2021-09-20: 1 mg via INTRAVENOUS
  Administered 2021-09-20: 5 mg via INTRAVENOUS

## 2021-09-20 MED ORDER — CHLORHEXIDINE GLUCONATE 0.12 % MT SOLN
15.0000 mL | Freq: Once | OROMUCOSAL | Status: AC
  Administered 2021-09-20: 15 mL via OROMUCOSAL

## 2021-09-20 MED ORDER — PROPOFOL 10 MG/ML IV BOLUS
INTRAVENOUS | Status: AC
  Filled 2021-09-20: qty 20

## 2021-09-20 MED ORDER — FENTANYL CITRATE (PF) 100 MCG/2ML IJ SOLN
INTRAMUSCULAR | Status: DC | PRN
Start: 2021-09-20 — End: 2021-09-20
  Administered 2021-09-20: 150 ug via INTRAVENOUS
  Administered 2021-09-20: 50 ug via INTRAVENOUS
  Administered 2021-09-20: 100 ug via INTRAVENOUS
  Administered 2021-09-20: 50 ug via INTRAVENOUS

## 2021-09-20 MED ORDER — BUPIVACAINE LIPOSOME 1.3 % IJ SUSP
INTRAMUSCULAR | Status: DC | PRN
Start: 2021-09-20 — End: 2021-09-20
  Administered 2021-09-20: 20 mL

## 2021-09-20 MED ORDER — SUCCINYLCHOLINE CHLORIDE 200 MG/10ML IV SOSY
PREFILLED_SYRINGE | INTRAVENOUS | Status: DC | PRN
  Administered 2021-09-20: 120 mg via INTRAVENOUS

## 2021-09-20 MED ORDER — ONDANSETRON HCL 4 MG/2ML IJ SOLN
INTRAMUSCULAR | Status: AC
  Filled 2021-09-20: qty 2

## 2021-09-20 MED ORDER — FENTANYL CITRATE (PF) 250 MCG/5ML IJ SOLN
INTRAMUSCULAR | Status: AC
  Filled 2021-09-20: qty 5

## 2021-09-20 MED ORDER — ORAL CARE MOUTH RINSE: 15.0000 mL | Freq: Once | OROMUCOSAL | Status: AC

## 2021-09-20 MED ORDER — SUGAMMADEX SODIUM 200 MG/2ML IV SOLN
INTRAVENOUS | Status: DC | PRN
  Administered 2021-09-20: 300 mg via INTRAVENOUS

## 2021-09-20 MED ORDER — FENTANYL CITRATE (PF) 100 MCG/2ML IJ SOLN
INTRAMUSCULAR | Status: AC
Start: 2021-09-20 — End: ?
  Filled 2021-09-20: qty 2

## 2021-09-20 MED ORDER — KETAMINE HCL 10 MG/ML IJ SOLN
INTRAMUSCULAR | Status: DC | PRN
  Administered 2021-09-20: 50 mg via INTRAVENOUS

## 2021-09-20 MED ORDER — LACTATED RINGERS IV SOLN: INTRAVENOUS | Status: DC | PRN

## 2021-09-20 MED ORDER — LACTATED RINGERS IV SOLN: INTRAVENOUS | Status: DC

## 2021-09-20 MED ORDER — HYDROMORPHONE HCL 1 MG/ML IJ SOLN
0.5000 mg | INTRAMUSCULAR | Status: DC | PRN
  Administered 2021-09-20 (×3): 0.5 mg via INTRAVENOUS
  Filled 2021-09-20 (×2): qty 0.5

## 2021-09-20 MED ORDER — DEXTROSE 50 % IV SOLN
INTRAVENOUS | Status: AC
  Filled 2021-09-20: qty 50

## 2021-09-20 MED ORDER — INSULIN ASPART 100 UNIT/ML IJ SOLN
0.0000 [IU] | INTRAMUSCULAR | Status: DC
  Administered 2021-09-20: 1 [IU] via SUBCUTANEOUS
  Administered 2021-09-20 – 2021-09-21 (×2): 2 [IU] via SUBCUTANEOUS
  Administered 2021-09-21: 1 [IU] via SUBCUTANEOUS
  Administered 2021-09-21: 2 [IU] via SUBCUTANEOUS

## 2021-09-20 SURGICAL SUPPLY — 39 items
APL PRP STRL LF DISP 70% ISPRP (MISCELLANEOUS) ×1
CHLORAPREP W/TINT 26 (MISCELLANEOUS) ×3 IMPLANT
CLOTH BEACON ORANGE TIMEOUT ST (SAFETY) ×3 IMPLANT
COVER LIGHT HANDLE STERIS (MISCELLANEOUS) ×6 IMPLANT
DRAPE UTILITY W/TAPE 26X15 (DRAPES) ×1 IMPLANT
DRAPE WARM FLUID 44X44 (DRAPES) ×3 IMPLANT
DRSG OPSITE POSTOP 4X10 (GAUZE/BANDAGES/DRESSINGS) ×1 IMPLANT
ELECT BLADE 6 FLAT ULTRCLN (ELECTRODE) ×1 IMPLANT
ELECT REM PT RETURN 9FT ADLT (ELECTROSURGICAL) ×2
ELECTRODE REM PT RTRN 9FT ADLT (ELECTROSURGICAL) ×2 IMPLANT
GAUZE 4X4 16PLY ~~LOC~~+RFID DBL (SPONGE) ×1 IMPLANT
GLOVE BIO SURGEON STRL SZ 6.5 (GLOVE) ×3 IMPLANT
GLOVE BIOGEL PI IND STRL 6.5 (GLOVE) ×2 IMPLANT
GLOVE BIOGEL PI IND STRL 7.0 (GLOVE) ×4 IMPLANT
GLOVE BIOGEL PI INDICATOR 6.5 (GLOVE) ×2
GLOVE BIOGEL PI INDICATOR 7.0 (GLOVE) ×2
GLOVE SURG SS PI 6.5 STRL IVOR (GLOVE) ×1 IMPLANT
GOWN STRL REUS W/TWL LRG LVL3 (GOWN DISPOSABLE) ×10 IMPLANT
HANDLE SUCTION POOLE (INSTRUMENTS) ×2 IMPLANT
INST SET MAJOR GENERAL (KITS) ×3 IMPLANT
KIT TURNOVER KIT A (KITS) ×3 IMPLANT
MANIFOLD NEPTUNE II (INSTRUMENTS) ×3 IMPLANT
NDL HYPO 21X1.5 SAFETY (NEEDLE) ×2 IMPLANT
NEEDLE HYPO 21X1.5 SAFETY (NEEDLE) ×2 IMPLANT
NS IRRIG 1000ML POUR BTL (IV SOLUTION) ×6 IMPLANT
PACK MAJOR ABDOMINAL (CUSTOM PROCEDURE TRAY) ×3 IMPLANT
PAD ARMBOARD 7.5X6 YLW CONV (MISCELLANEOUS) ×3 IMPLANT
PENCIL SMOKE EVACUATOR COATED (MISCELLANEOUS) ×3 IMPLANT
RETRACTOR WND ALEXIS-O 25 LRG (MISCELLANEOUS) IMPLANT
RTRCTR WOUND ALEXIS O 25CM LRG (MISCELLANEOUS) ×2
SET BASIN LINEN APH (SET/KITS/TRAYS/PACK) ×3 IMPLANT
SPONGE T-LAP 18X18 ~~LOC~~+RFID (SPONGE) ×3 IMPLANT
STAPLER VISISTAT (STAPLE) ×3 IMPLANT
SUCTION POOLE HANDLE (INSTRUMENTS) ×2
SUT PDS AB CT VIOLET #0 27IN (SUTURE) ×6 IMPLANT
SUT SILK 3 0 SH CR/8 (SUTURE) ×4 IMPLANT
SYR 20ML LL LF (SYRINGE) ×5 IMPLANT
TOWEL ~~LOC~~+RFID 17X26 BLUE (SPONGE) ×1 IMPLANT
TRAY FOLEY MTR SLVR 16FR STAT (SET/KITS/TRAYS/PACK) ×3 IMPLANT

## 2021-09-20 NOTE — Anesthesia Preprocedure Evaluation (Addendum)
Anesthesia Evaluation  Patient identified by MRN, date of birth, ID band Patient awake    Reviewed: Allergy & Precautions, NPO status , Patient's Chart, lab work & pertinent test results  Airway Mallampati: II  TM Distance: >3 FB Neck ROM: Full    Dental  (+) Dental Advisory Given, Missing, Chipped,    Pulmonary asthma , sleep apnea and Continuous Positive Airway Pressure Ventilation , former smoker,    Pulmonary exam normal breath sounds clear to auscultation       Cardiovascular Exercise Tolerance: Good hypertension, Pt. on medications + angina + CAD, + Past MI and + Cardiac Stents  Normal cardiovascular exam Rhythm:Regular Rate:Normal  17-Sep-2021 18:09:17 [place] [facility]-AP-300 ROUTINE RECORD 12-07-1945 (89 yr) Male Caucasian Vent. rate 81 BPM PR interval 156 ms QRS duration 84 ms QT/QTcB 354/411 ms P-R-T axes 45 -5 10 Normal sinus rhythm Normal ECG When compared with ECG of 24-Sep-2020 20:38, PREVIOUS ECG IS PRESENT No significant change since last tracing Confirmed by Lauree Chandler [phone]) on 09/18/2021 5:37:04 PM   Neuro/Psych negative neurological ROS  negative psych ROS   GI/Hepatic negative GI ROS, Neg liver ROS,   Endo/Other  diabetes, Well Controlled, Type 2, Oral Hypoglycemic Agents  Renal/GU Renal InsufficiencyRenal disease  negative genitourinary   Musculoskeletal negative musculoskeletal ROS (+)   Abdominal   Peds negative pediatric ROS (+)  Hematology negative hematology ROS (+)   Anesthesia Other Findings   Reproductive/Obstetrics negative OB ROS                             Anesthesia Physical Anesthesia Plan  ASA: 3  Anesthesia Plan: General   Post-op Pain Management: Dilaudid IV   Induction: Intravenous, Rapid sequence and Cricoid pressure planned  PONV Risk Score and Plan: 4 or greater and Ondansetron  Airway Management Planned:  Oral ETT  Additional Equipment:   Intra-op Plan:   Post-operative Plan: Extubation in OR and Possible Post-op intubation/ventilation  Informed Consent: I have reviewed the patients History and Physical, chart, labs and discussed the procedure including the risks, benefits and alternatives for the proposed anesthesia with the patient or authorized representative who has indicated his/her understanding and acceptance.     Dental advisory given  Plan Discussed with: CRNA and Surgeon  Anesthesia Plan Comments:         Anesthesia Quick Evaluation

## 2021-09-20 NOTE — Anesthesia Postprocedure Evaluation (Signed)
Anesthesia Post Note  Patient: [name]  Procedure(s) Performed: EXPLORATORY LAPAROTOMY,  LYSIS OF ADHESIONS, REDUCTION OF VOLVULUS (Abdomen) GASTRIC BAND REMOVAL (Abdomen)  Patient location during evaluation: PACU Anesthesia Type: General Level of consciousness: awake and alert and oriented Pain management: pain level controlled Vital Signs Assessment: post-procedure vital signs reviewed and stable Respiratory status: spontaneous breathing, nonlabored ventilation and respiratory function stable Cardiovascular status: blood pressure returned to baseline and stable Postop Assessment: no apparent nausea or vomiting Anesthetic complications: no   No notable events documented.   Last Vitals:  Vitals:   09/20/21 1200 09/20/21 1215  BP: (!) 178/79 (!) 164/77  Pulse: 80 78  Resp: 14 (!) 0  Temp:    SpO2: 100% 98%    Last Pain:  Vitals:   09/20/21 1215  TempSrc:   PainSc: 6                  Alane Hanssen C Melvinia Ashby

## 2021-09-20 NOTE — Progress Notes (Signed)
Patient assisted to the bedside chair. He has been voiding in the urinal. Pain tolerable at this time.

## 2021-09-20 NOTE — Anesthesia Procedure Notes (Signed)
Procedure Name: Intubation Date/Time: 09/20/2021 9:34 AM Performed by: Orlie Dakin, CRNA Pre-anesthesia Checklist: Patient identified, Emergency Drugs available, Suction available and Patient being monitored Patient Re-evaluated:Patient Re-evaluated prior to induction Oxygen Delivery Method: Circle system utilized Preoxygenation: Pre-oxygenation with 100% oxygen Induction Type: IV induction Laryngoscope Size: Mac and 4 Grade View: Grade I Tube type: Oral Tube size: 8.0 mm Number of attempts: 1 Airway Equipment and Method: Stylet Placement Confirmation: ETT inserted through vocal cords under direct vision, positive ETCO2 and breath sounds checked- equal and bilateral Secured at: 23 cm Tube secured with: Tape Dental Injury: Teeth and Oropharynx as per pre-operative assessment  Comments: DL and intubation per [place] CRNA

## 2021-09-20 NOTE — Transfer of Care (Signed)
Immediate Anesthesia Transfer of Care Note  Patient: Kevin [name]  Procedure(s) Performed: EXPLORATORY LAPAROTOMY,  LYSIS OF ADHESIONS, REDUCTION OF VOLVULUS (Abdomen) GASTRIC BAND REMOVAL (Abdomen)  Patient Location: PACU  Anesthesia Type:General  Level of Consciousness: awake, alert  and oriented  Airway & Oxygen Therapy: Patient Spontanous Breathing and Patient connected to face mask oxygen  Post-op Assessment: Report given to RN and Post -op Vital signs reviewed and stable  Post vital signs: Reviewed and stable  Last Vitals:  Vitals Value Taken Time  BP    Temp    Pulse    Resp    SpO2      Last Pain:  Vitals:   09/20/21 0805  TempSrc: Oral  PainSc: 0-No pain      Patients Stated Pain Goal: 0 (26/41/58 3094)  Complications: No notable events documented.

## 2021-09-20 NOTE — Op Note (Signed)
[facility] Operative Note  09/20/21  Preoperative Diagnosis: Small bowel obstruction possible volvulus versus internal hernia, gastric band in place    Postoperative Diagnosis: Small bowel obstruction, small bowel volvulus, gastric band in place     Procedure(s) Performed: Exploratory laparotomy, reduction of volvulus, lysis of adhesions, removal of gastric band    Surgeon: Kevin Matar. Constance Haw, MD   Assistants: Kevin Freer, Kevin [name]   Anesthesia: General endotracheal   Anesthesiologist: Dr. Charna Elizabeth, MD   Specimens: Gastric band   Estimated Blood Loss: Minimal   Blood Replacement: None    Complications: None   Wound Class: Clean   Operative Indications: Kevin [name] is a 76 yo who had a small bowel obstruction and concern for volvulus or internal hernia and a gastric band in place that he wanted removed. We discussed exploration and lysis of adhesions, volvulus reduction or internal hernia reduction, gastric band removal and risk of bleeding, infection, needing a resection, injury to stomach from the band removal. He opted to proceed. He was crossed for blood given his recent Plavix use (off 4 days preop) pending any need for blood products.   Findings: Gastric band with significant scarring and rind, small bowel volvulus of the proximal jejunum and distal ileum with interloop adhesions but no obstruction    Procedure: The patient was taken to the operating room and placed supine. General endotracheal anesthesia was induced. Intravenous antibiotics were administered per protocol.  An orogastric tube positioned to decompress the stomach.  A foley was placed. The abdomen was prepared and draped in the usual sterile fashion.   A midline incision was made and carried down to the fascia. The fascia was entered with care and opened and a wound protector was placed. The gastric band catheter was divided since it was in the middle of the field in the anterior abdomen.  The port site for the  gastric band was opened and the capsule for the port was opened the sutures were removed and the port and the portion of the catheter attached to it was removed in its entirety.   Attention was then turned to the small bowel. Proximal jejunum was clearly flipping over the distal bowel going into the right upper quadrant and looked dilated and the mesentery was edematous. This was consistent with a volvulized segment of small bowel.  The remaining bowel was ran and the terminal ileum had interloop adhesions that snaked back to the proximal ileum and some adhesion in the right pelvis from his prior appendectomy. The adhesion to the right pelvis were taken down with scissors.  There was a small area of possible serosal injury on the terminal ileum and the cecum that were oversewed with Lembert 3-0 Silk sutures. Epiploic fat adjacent to these areas were tacked over the sites.    The bowel was ran back multiple times. There was no other injuries or points of obstruction. The volvulus had been reduced. He did have a redundant sigmoid colon but no signs of major diverticular disease. The bowel was then packed down and he was placed in reverse Trendelenburg position. The laparotomy site had to be extended up to get to the gastric band. The band was scarred down with the liver edge lying over it. With care the liver was carefully cauterized off. The band capsule was opened and I was able to get my finger around the band and the band was cut.  The band would not pull from the very extensive capsule and scarring, so I  had to further dissect out the anterior portion of the capsule scar off the band and the band finally released. The gastric band and remaining portion of the catheter were removed in their entirety. The posterior part of the rind/ capsule was very thick and acted almost like a nature lap band given how scarred in this was. I released this posterior wall of the capsule with care using a  right angle and scissors.  There was no signs of any gastric injury. The patient was flattened back out. Saline was placed in the area and the Ng was used to insufflate the stomach with air. There was no leaking of air or bubbles.   The irrigation was suctioned out. No bleeding from the liver edge was noted. The right pelvis was inspected again and no bleeding was noted. The abdomen was closed with 0 PDS suture in the standard fashion. The port site from the gastric band was cauterized removing some of the capsule. Exparel was injected.  The skin on both the midline and the port site were closed with staples. A honeycomb dressing was placed over both sites.   Kevin [name] was assisting throughout the procedure helping with retraction and removal of the very scarred in gastric band and helping to release the adhesions in the pelvis. She was present for the critical portions of the case.   Final inspection revealed acceptable hemostasis. All counts were correct at the end of the case. The patient was awakened from anesthesia and extubated without complication.  The patient went to the PACU in stable condition.   Kevin Labrum, MD [facility] [address] [place], [place] [address] [phone] (office)

## 2021-09-20 NOTE — Progress Notes (Signed)
[facility]  Updated his sister in law. Will do clears but  go slow. Volvulus reduced and gastric band out.   Curlene Labrum, MD [facility] [address] [place], [place] [address] [phone] (office)

## 2021-09-20 NOTE — Progress Notes (Signed)
PROGRESS NOTE  Kevin [name] UYQ:[id number] DOB: 09/01/1945 DOA: 09/10/2021 PCP: Sharilyn Sites, MD  Brief History:  Kevin [name] is a 76 y.o. male with medical history significant of CAD s/p PCI, hypertension, hyperlipidemia, type 2 diabetes with who presents to the emergency department due to abdominal pain and nausea which started yesterday.  Patient complained of sudden onset of abdominal pain which was associated with nausea and dry heaving.  He complained of abdominal distention since onset of symptoms, stated to go to the ED for further evaluation and management.  Of note, patient had a lap band done in [facility] in 2011.  He denies chest pain, shortness of breath, fever, chills.   ED Course:  In the emergency department, he was hemodynamically stable.  Work-up in the ED showed normal CBC. BMP showed BUN/creatinine 95/3.42 (baseline creatinine at 1.1-1.6), CBG 279, lipase 38. CT abdomen and pelvis without contrast showed Mid small bowel obstruction with point of transition within the left mid abdomen. Underlying adhesions suspected. Superimposed mildly incomplete duodenal rotation. No free intraperitoneal gas or fluid identified. He was treated with IV fentanyl and Zofran.  General surgery was consulted and recommended admitting patient with plan to see patient in the morning per ED physician.  Hospitalist was asked to admit patient for further evaluation and management      Assessment and Plan: * Small bowel obstruction ([place]) -Reporting experiencing once again severe abdominal pain with associated nausea and dry heaving.  5/16 CT abd--continued small bowel dilatation with transition zone seen in the pelvis;  noted twisting of mesenteric structures in this area, and the possibility of closed loop obstruction secondary to malrotation or even internal hernia cannot be excluded as the cause of bowel obstruction -Case discussed with general surgery who is planning to take patient  to the OR 5/19 -keep patient NPO. -Medications to be given through alternate route.  Gouty arthritis of both feet Improving with steroids started on 5/15  Type 2 diabetes mellitus with hyperglycemia ([place]) -With nephropathy complication (chronic kidney disease stage IIIa). -Continue sliding scale insulin and long-acting insulin -Continue to hold oral hypoglycemic agents while inpatient. -Initiation of his steroids could potentially increase CBGs; will follow trend and adjust management as required. 09/11/21 A1C--7.7 --discontinue semglee as CBGs trending down  Acute renal failure superimposed on stage 3a chronic kidney disease (HCC) -baseline creatinine of 1.1-1.4 -serum creatinine peaked 3.42 -Continue minimizing nephrotoxic agents -Continue to maintain adequate hydration. -In the setting of prerenal azotemia; volume depletion -improved with IVF  Obesity (BMI 30-39.9) -Body mass index is 37.45 kg/m. -Patient with prior history of lap band placement in 2011.  Mixed hyperlipidemia -Continue statins when able to tolerate p.o.'s again. -Heart healthy diet discussed with patient.  Essential hypertension -Blood pressure stable -Continue as needed hydralazine -Continue IV metoprolol -The rest of his antihypertensive agent has been placed on hold until able to take po  Coronary artery disease, hx LAD and LCX stenting in 2001 & 2004, last cath 2009 with patent stents. -No chest pain currently -Continue IV metoprolol -Aspirin and Plavix on hold until able to tolerate p.o.'s. and for surgery   Family Communication:   no Family at bedside   Consultants:  general surgery   Code Status:  FULL    DVT Prophylaxis:  [place] Heparin     Procedures: As Listed in Progress Note Above   Antibiotics: None       Subjective: Foot pain continues  to improve.  Denies f/c, cp, sob.  Passing gas, but no BM last 24 hours.    Objective: Vitals:   09/19/21 1337 09/19/21 1525 09/19/21  2008 09/20/21 0418  BP: 137/68 (!) 147/69 (!) 143/77 (!) 141/59  Pulse: 71  (!) 58 67  Resp: '20  19 17  '$ Temp: 98.1 F (36.7 C)  97.9 F (36.6 C) 97.7 F (36.5 C)  TempSrc: Oral   Oral  SpO2: 100% 99% 98% 98%  Weight:      Height:        Intake/Output Summary (Last 24 hours) at 09/20/2021 0717 Last data filed at 09/19/2021 1543 Gross per 24 hour  Intake --  Output 600 ml  Net -600 ml   Weight change:  Exam:  General:  Pt is alert, follows commands appropriately, not in acute distress HEENT: No icterus, No thrush, No neck mass, [place]/AT Cardiovascular: RRR, S1/S2, no rubs, no gallops Respiratory: CTA bilaterally, no wheezing, no crackles, no rhonchi Abdomen: Soft/+BS, non tender, non distended, no guarding Extremities: trace LE edema, No lymphangitis, No petechiae, No rashes, no synovitis   Data Reviewed: I have personally reviewed following labs and imaging studies Basic Metabolic Panel: Recent Labs  Lab 09/14/21 0448 09/16/21 0455 09/18/21 0528  NA 143 141 138  K 4.1 4.1 4.4  CL 107 106 103  CO2 '29 28 27  '$ GLUCOSE 131* 110* 141*  BUN 51* 38* 34*  CREATININE 1.31* 1.54* 1.29*  CALCIUM 9.1 9.3 8.9   Liver Function Tests: No results for input(s): AST, ALT, ALKPHOS, BILITOT, PROT, ALBUMIN in the last 168 hours. No results for input(s): LIPASE, AMYLASE in the last 168 hours. No results for input(s): AMMONIA in the last 168 hours. Coagulation Profile: No results for input(s): INR, PROTIME in the last 168 hours. CBC: Recent Labs  Lab 09/18/21 0528  WBC 7.1  HGB 10.9*  HCT 33.3*  MCV 96.5  PLT 188   Cardiac Enzymes: No results for input(s): CKTOTAL, CKMB, CKMBINDEX, TROPONINI in the last 168 hours. BNP: Invalid input(s): POCBNP CBG: Recent Labs  Lab 09/19/21 1143 09/19/21 1536 09/19/21 2009 09/20/21 0015 09/20/21 0415  GLUCAP 104* 130* 124* 89 72   HbA1C: No results for input(s): HGBA1C in the last 72 hours. Urine analysis:    Component Value  Date/Time   COLORURINE YELLOW 09/11/2021 1500   APPEARANCEUR CLEAR 09/11/2021 1500   LABSPEC 1.013 09/11/2021 1500   PHURINE 5.0 09/11/2021 1500   GLUCOSEU 150 (A) 09/11/2021 1500   HGBUR NEGATIVE 09/11/2021 1500   BILIRUBINUR NEGATIVE 09/11/2021 1500   KETONESUR NEGATIVE 09/11/2021 1500   PROTEINUR NEGATIVE 09/11/2021 1500   UROBILINOGEN 0.2 10/01/2010 1544   NITRITE NEGATIVE 09/11/2021 1500   LEUKOCYTESUR NEGATIVE 09/11/2021 1500   Sepsis Labs: '@LABRCNTIP'$ (procalcitonin:4,lacticidven:4) )No results found for this or any previous visit (from the past 240 hour(s)).   Scheduled Meds:  bisacodyl  10 mg Rectal Daily   Chlorhexidine Gluconate Cloth  6 each Topical Once   insulin aspart  0-9 Units Subcutaneous Q4H   methylPREDNISolone (SOLU-MEDROL) injection  40 mg Intravenous Daily   metoprolol tartrate  2.5 mg Intravenous Q8H   pantoprazole (PROTONIX) IV  40 mg Intravenous Q12H   polyethylene glycol  17 g Oral Daily   Continuous Infusions:  sodium chloride 75 mL/hr at 09/19/21 0219   metronidazole     And   ciprofloxacin      Procedures/Studies: CT ABDOMEN PELVIS WO CONTRAST  Result Date: 09/17/2021 CLINICAL DATA:  Acute generalized abdominal  pain. EXAM: CT ABDOMEN AND PELVIS WITHOUT CONTRAST TECHNIQUE: Multidetector CT imaging of the abdomen and pelvis was performed following the standard protocol without IV contrast. RADIATION DOSE REDUCTION: This exam was performed according to the departmental dose-optimization program which includes automated exposure control, adjustment of the mA and/or kV according to patient size and/or use of iterative reconstruction technique. COMPARISON:  Sep 10, 2021. FINDINGS: Lower chest: No acute abnormality. Hepatobiliary: No focal liver abnormality is seen. No gallstones, gallbladder wall thickening, or biliary dilatation. Pancreas: Unremarkable. No pancreatic ductal dilatation or surrounding inflammatory changes. Spleen: Stable small calcified cystic  lesion is seen in the spleen. No other abnormality is noted. Adrenals/Urinary Tract: Adrenal glands are unremarkable. Kidneys are normal, without renal calculi, focal lesion, or hydronephrosis. Bladder is unremarkable. Stomach/Bowel: Lap band device is again noted and unchanged in position. Stomach is otherwise unremarkable. Stool and contrast are seen throughout the nondilated colon, although contrast may be from previous CT exam. However, there is continued small bowel dilatation with transition zone seen in the pelvis, best seen on image number 69 of series 2. There is seen some twisting of mesenteric structures and this is concerning for possible malrotation or possible internal hernia. Vascular/Lymphatic: Aortic atherosclerosis. No enlarged abdominal or pelvic lymph nodes. Reproductive: Mild prostatic enlargement is noted. Penile reservoir is noted anteriorly in the pelvis. Other: No abdominal wall hernia or abnormality. No abdominopelvic ascites. Musculoskeletal: No acute or significant osseous findings. IMPRESSION: There is continued small bowel dilatation with transition zone seen in the pelvis. While this may simply be due to adhesion, there is noted twisting of mesenteric structures in this area, and the possibility of closed loop obstruction secondary to malrotation or even internal hernia cannot be excluded as the cause of bowel obstruction. There is noted stool and contrast in the nondilated colon, but potentially this contrast may have been from prior CT scan of May 9th. These results will be called to the ordering clinician or representative by the Radiologist Assistant, and communication documented in the PACS or zVision Dashboard. Aortic Atherosclerosis (ICD10-I70.0). Electronically Signed   By: Marijo Conception M.D.   On: 09/17/2021 12:54   CT ABDOMEN PELVIS WO CONTRAST  Result Date: 09/10/2021 CLINICAL DATA:  Abdominal pain, vomiting, bowel obstruction EXAM: CT ABDOMEN AND PELVIS WITHOUT  CONTRAST TECHNIQUE: Multidetector CT imaging of the abdomen and pelvis was performed following the standard protocol without IV contrast. RADIATION DOSE REDUCTION: This exam was performed according to the departmental dose-optimization program which includes automated exposure control, adjustment of the mA and/or kV according to patient size and/or use of iterative reconstruction technique. COMPARISON:  03/05/2020 FINDINGS: Lower chest: The visualized lung bases are clear. At least moderate coronary artery calcification. Global cardiac size within normal limits. Hepatobiliary: No focal liver abnormality is seen. No gallstones, gallbladder wall thickening, or biliary dilatation. Pancreas: Unremarkable. No pancreatic ductal dilatation or surrounding inflammatory changes. Spleen: 12 mm rim calcified cystic lesion within the a upper pole of the spleen is stable since prior examination and is safely considered benign, possibly the sequela of remote trauma or inflammation. Spleen is otherwise unremarkable. Adrenals/Urinary Tract: Adrenal glands are unremarkable. Kidneys are normal, without renal calculi, focal lesion, or hydronephrosis. Bladder is unremarkable. Stomach/Bowel: There are multiple fluid-filled dilated loops of small bowel seen proximally with a point of transition within the mid to distal small bowel within the left mid abdomen at axial image # 49/2 and coronal image # 50/5 in keeping with a mid small bowel obstruction. A  discrete obstructing lesion is not identified and an underlying adhesion is favored. There is i mildly ncomplete duodenal rotation with the ligament of Treitz seen to the left of midline, but not elevating to the level of the gastroduodenal junction, best seen on coronal image # 75/5. The distal small bowel appears decompressed as does the colon. Moderate sigmoid colonic diverticulosis is noted. The appendix is not clearly identified, however, no secondary signs of appendicitis are seen  within the right lower quadrant. Gastric lap band device is in place and appears intact and appropriately position. No free intraperitoneal gas or fluid. Vascular/Lymphatic: Aortic atherosclerosis. No enlarged abdominal or pelvic lymph nodes. Reproductive: Mild prostatic enlargement. Decompressed penile prosthetic reservoir noted within the pre vesicular space. Other: No abdominal wall hernia. Musculoskeletal: No acute bone abnormality. No lytic or blastic bone lesion. Osseous structures are age-appropriate. IMPRESSION: Mid small bowel obstruction with point of transition within the left mid abdomen. Underlying adhesions suspected. Superimposed mildly incomplete duodenal rotation. No free intraperitoneal gas or fluid identified. Status post gastric lap band placement. Device appears intact and normally position. Moderate sigmoid diverticulosis. Mild prostatic hypertrophy. Aortic Atherosclerosis (ICD10-I70.0). Electronically Signed   By: Fidela Salisbury M.D.   On: 09/10/2021 21:20   DG Abd 1 View  Result Date: 09/11/2021 CLINICAL DATA:  NG tube placement. EXAM: ABDOMEN - 1 VIEW COMPARISON:  10/08/2010 FINDINGS: 1305 hours. NG tube tip is in the mid stomach. Visualized upper abdomen reveals gaseous bowel distension. Lap band overlies the medial left upper quadrant. IMPRESSION: NG tube tip is in the mid stomach. Lap band noted in the region of the esophagogastric junction. Electronically Signed   By: Misty Stanley M.D.   On: 09/11/2021 13:13   DG Abd Portable 1V-Small Bowel Obstruction Protocol-initial, 8 hr delay  Result Date: 09/12/2021 CLINICAL DATA:  Small bowel obstruction, 8 hour delayed film. EXAM: PORTABLE ABDOMEN - 1 VIEW COMPARISON:  Radiograph yesterday.  CT 09/10/2021 FINDINGS: There is enteric contrast in the ascending, transverse, descending and rectosigmoid colon. Mild persistent gaseous small bowel distension centrally. Laparoscopic gastric band. Enteric tube tip passes beyond the band.  IMPRESSION: Enteric contrast in the ascending, transverse, descending and rectosigmoid colon. Findings consistent with resolving or partial small bowel obstruction. Mild persistent gaseous small bowel distension centrally. Electronically Signed   By: Keith Rake M.D.   On: 09/12/2021 19:53    Orson Eva, DO  [facility]  If 7PM-7AM, please contact night-coverage [email or web address] Password [email or web address] 09/20/2021, 7:17 AM   LOS: 10 days

## 2021-09-20 NOTE — Interval H&P Note (Signed)
History and Physical Interval Note:  09/20/2021 8:50 AM  Kevin [name]  has presented today for surgery, with the diagnosis of small bowel obstruction, possible volvulous versus internal hernia, gastric band.  The various methods of treatment have been discussed with the patient and family. After consideration of risks, benefits and other options for treatment, the patient has consented to  Procedure(s): EXPLORATORY LAPAROTOMY, lysis of adhesions, possible small bowel resection, removal of gastric band (N/A) as a surgical intervention.  The patient's history has been reviewed, patient examined, no change in status, stable for surgery.  I have reviewed the patient's chart and labs.  Questions were answered to the patient's satisfaction.   Potential volvulus, removal of lap band  Virl Cagey

## 2021-09-21 DIAGNOSIS — K56609 Unspecified intestinal obstruction, unspecified as to partial versus complete obstruction: Secondary | ICD-10-CM | POA: Diagnosis not present

## 2021-09-21 DIAGNOSIS — N1831 Chronic kidney disease, stage 3a: Secondary | ICD-10-CM | POA: Diagnosis not present

## 2021-09-21 DIAGNOSIS — N179 Acute kidney failure, unspecified: Secondary | ICD-10-CM | POA: Diagnosis not present

## 2021-09-21 DIAGNOSIS — E782 Mixed hyperlipidemia: Secondary | ICD-10-CM | POA: Diagnosis not present

## 2021-09-21 LAB — GLUCOSE, CAPILLARY
Glucose-Capillary: 104 mg/dL — ABNORMAL HIGH (ref 70–99)
Glucose-Capillary: 122 mg/dL — ABNORMAL HIGH (ref 70–99)
Glucose-Capillary: 140 mg/dL — ABNORMAL HIGH (ref 70–99)
Glucose-Capillary: 144 mg/dL — ABNORMAL HIGH (ref 70–99)
Glucose-Capillary: 151 mg/dL — ABNORMAL HIGH (ref 70–99)
Glucose-Capillary: 167 mg/dL — ABNORMAL HIGH (ref 70–99)

## 2021-09-21 MED ORDER — INSULIN ASPART 100 UNIT/ML IJ SOLN
0.0000 [IU] | Freq: Three times a day (TID) | INTRAMUSCULAR | Status: DC
  Administered 2021-09-22 – 2021-09-23 (×2): 2 [IU] via SUBCUTANEOUS

## 2021-09-21 NOTE — Progress Notes (Signed)
PROGRESS NOTE  SANKALP FERRELL OBS:[id number] DOB: 1945-11-30 DOA: 09/10/2021 PCP: Sharilyn Sites, MD  Brief History:  MD SMOLA is a 76 y.o. male with medical history significant of CAD s/p PCI, hypertension, hyperlipidemia, type 2 diabetes with who presents to the emergency department due to abdominal pain and nausea which started yesterday.  Patient complained of sudden onset of abdominal pain which was associated with nausea and dry heaving.  He complained of abdominal distention since onset of symptoms, stated to go to the ED for further evaluation and management.  Of note, patient had a lap band done in [facility] in 2011.  He denies chest pain, shortness of breath, fever, chills.   ED Course:  In the emergency department, he was hemodynamically stable.  Work-up in the ED showed normal CBC. BMP showed BUN/creatinine 95/3.42 (baseline creatinine at 1.1-1.6), CBG 279, lipase 38. CT abdomen and pelvis without contrast showed Mid small bowel obstruction with point of transition within the left mid abdomen. Underlying adhesions suspected. Superimposed mildly incomplete duodenal rotation. No free intraperitoneal gas or fluid identified. He was treated with IV fentanyl and Zofran.  General surgery was consulted and recommended admitting patient with plan to see patient in the morning per ED physician.  Hospitalist was asked to admit patient for further evaluation and management      Assessment and Plan: * Small bowel obstruction ([place]) -Reporting experiencing once again severe abdominal pain with associated nausea and dry heaving.  5/16 CT abd--continued small bowel dilatation with transition zone seen in the pelvis;  noted twisting of mesenteric structures in this area, and the possibility of closed loop obstruction secondary to malrotation or even internal hernia cannot be excluded as the cause of bowel obstruction -5/19--: Exploratory laparotomy, reduction of volvulus, lysis of  adhesions, removal of gastric band  5/20--full liquid diet, increase activity  Volvulus of jejunum (HCC) S/p reduction 09/20/20--Dr. Constance Haw  Gouty arthritis of both feet Improving with steroids started on 5/15  Type 2 diabetes mellitus with hyperglycemia ([place]) -With nephropathy complication (chronic kidney disease stage IIIa). -Continue sliding scale insulin  -Continue to hold oral hypoglycemic agents while inpatient. -Initiation of his steroids could potentially increase CBGs; will follow trend and adjust management as required. 09/11/21 A1C--7.7 --discontinue semglee as CBGs trending down  Acute renal failure superimposed on stage 3a chronic kidney disease (HCC) -baseline creatinine of 1.1-1.4 -serum creatinine peaked 3.42 -Continue minimizing nephrotoxic agents -Continue to maintain adequate hydration. -In the setting of prerenal azotemia; volume depletion -improved with IVF  Obesity (BMI 30-39.9) -Body mass index is 37.45 kg/m. -Patient with prior history of lap band placement in 2011.  Mixed hyperlipidemia -Continue statins when able to tolerate p.o.'s again. -Heart healthy diet discussed with patient.  Essential hypertension -Blood pressure controlled -Continue as needed hydralazine -Continue IV metoprolol until tolerating po -The rest of his antihypertensive agent has been placed on hold until able to take po  Coronary artery disease, hx LAD and LCX stenting in 2001 & 2004, last cath 2009 with patent stents. -No chest pain currently -Continue IV metoprolol -Aspirin and Plavix on hold until able to tolerate p.o.'s. and for surgery   Family Communication:   no Family at bedside   Consultants:  general surgery   Code Status:  FULL    DVT Prophylaxis:  [place] Heparin     Procedures: As Listed in Progress Note Above   Antibiotics: None  Subjective: Patient denies fevers, chills, headache, chest pain, dyspnea, nausea, vomiting, diarrhea,  abdominal pain, dysuria, hematuria, hematochezia, and melena.   Objective: Vitals:   09/20/21 1954 09/21/21 0015 09/21/21 0430 09/21/21 1315  BP: 136/78 128/75 121/85 115/81  Pulse: (!) 109 (!) 110 (!) 101 (!) 102  Resp: '20 20 20 18  '$ Temp: 98.2 F (36.8 C) 98.5 F (36.9 C) 98.1 F (36.7 C) 98 F (36.7 C)  TempSrc: Oral Oral Oral Oral  SpO2: 96% 93% 96% 100%  Weight:      Height:        Intake/Output Summary (Last 24 hours) at 09/21/2021 1633 Last data filed at 09/21/2021 1300 Gross per 24 hour  Intake 1640 ml  Output 500 ml  Net 1140 ml   Weight change:  Exam:  General:  Pt is alert, follows commands appropriately, not in acute distress HEENT: No icterus, No thrush, No neck mass, [place]/AT Cardiovascular: RRR, S1/S2, no rubs, no gallops Respiratory: CTA bilaterally, no wheezing, no crackles, no rhonchi Abdomen: Soft/+BS, upper abd tender, non distended, no guarding Extremities: trace LE edema, No lymphangitis, No petechiae, No rashes, no synovitis   Data Reviewed: I have personally reviewed following labs and imaging studies Basic Metabolic Panel: Recent Labs  Lab 09/16/21 0455 09/18/21 0528  NA 141 138  K 4.1 4.4  CL 106 103  CO2 28 27  GLUCOSE 110* 141*  BUN 38* 34*  CREATININE 1.54* 1.29*  CALCIUM 9.3 8.9   Liver Function Tests: No results for input(s): AST, ALT, ALKPHOS, BILITOT, PROT, ALBUMIN in the last 168 hours. No results for input(s): LIPASE, AMYLASE in the last 168 hours. No results for input(s): AMMONIA in the last 168 hours. Coagulation Profile: No results for input(s): INR, PROTIME in the last 168 hours. CBC: Recent Labs  Lab 09/18/21 0528  WBC 7.1  HGB 10.9*  HCT 33.3*  MCV 96.5  PLT 188   Cardiac Enzymes: No results for input(s): CKTOTAL, CKMB, CKMBINDEX, TROPONINI in the last 168 hours. BNP: Invalid input(s): POCBNP CBG: Recent Labs  Lab 09/21/21 0016 09/21/21 0432 09/21/21 0732 09/21/21 1115 09/21/21 1614  GLUCAP 151* 122*  104* 167* 140*   HbA1C: No results for input(s): HGBA1C in the last 72 hours. Urine analysis:    Component Value Date/Time   COLORURINE YELLOW 09/20/2021 1750   APPEARANCEUR HAZY (A) 09/20/2021 1750   LABSPEC 1.015 09/20/2021 1750   PHURINE 5.0 09/20/2021 1750   GLUCOSEU 150 (A) 09/20/2021 1750   HGBUR LARGE (A) 09/20/2021 1750   BILIRUBINUR NEGATIVE 09/20/2021 1750   KETONESUR 5 (A) 09/20/2021 1750   PROTEINUR 30 (A) 09/20/2021 1750   UROBILINOGEN 0.2 10/01/2010 1544   NITRITE NEGATIVE 09/20/2021 1750   LEUKOCYTESUR NEGATIVE 09/20/2021 1750   Sepsis Labs: '@LABRCNTIP'$ (procalcitonin:4,lacticidven:4) )No results found for this or any previous visit (from the past 240 hour(s)).   Scheduled Meds:  bisacodyl  10 mg Rectal Daily   Chlorhexidine Gluconate Cloth  6 each Topical Once   docusate sodium  100 mg Oral BID   [START ON 09/22/2021] insulin aspart  0-9 Units Subcutaneous TID WC   metoprolol tartrate  2.5 mg Intravenous Q6H   pantoprazole (PROTONIX) IV  40 mg Intravenous Q12H   polyethylene glycol  17 g Oral Daily   Continuous Infusions:  sodium chloride 75 mL/hr at 09/21/21 8341    Procedures/Studies: CT ABDOMEN PELVIS WO CONTRAST  Result Date: 09/17/2021 CLINICAL DATA:  Acute generalized abdominal pain. EXAM: CT ABDOMEN AND PELVIS WITHOUT CONTRAST TECHNIQUE:  Multidetector CT imaging of the abdomen and pelvis was performed following the standard protocol without IV contrast. RADIATION DOSE REDUCTION: This exam was performed according to the departmental dose-optimization program which includes automated exposure control, adjustment of the mA and/or kV according to patient size and/or use of iterative reconstruction technique. COMPARISON:  Sep 10, 2021. FINDINGS: Lower chest: No acute abnormality. Hepatobiliary: No focal liver abnormality is seen. No gallstones, gallbladder wall thickening, or biliary dilatation. Pancreas: Unremarkable. No pancreatic ductal dilatation or  surrounding inflammatory changes. Spleen: Stable small calcified cystic lesion is seen in the spleen. No other abnormality is noted. Adrenals/Urinary Tract: Adrenal glands are unremarkable. Kidneys are normal, without renal calculi, focal lesion, or hydronephrosis. Bladder is unremarkable. Stomach/Bowel: Lap band device is again noted and unchanged in position. Stomach is otherwise unremarkable. Stool and contrast are seen throughout the nondilated colon, although contrast may be from previous CT exam. However, there is continued small bowel dilatation with transition zone seen in the pelvis, best seen on image number 69 of series 2. There is seen some twisting of mesenteric structures and this is concerning for possible malrotation or possible internal hernia. Vascular/Lymphatic: Aortic atherosclerosis. No enlarged abdominal or pelvic lymph nodes. Reproductive: Mild prostatic enlargement is noted. Penile reservoir is noted anteriorly in the pelvis. Other: No abdominal wall hernia or abnormality. No abdominopelvic ascites. Musculoskeletal: No acute or significant osseous findings. IMPRESSION: There is continued small bowel dilatation with transition zone seen in the pelvis. While this may simply be due to adhesion, there is noted twisting of mesenteric structures in this area, and the possibility of closed loop obstruction secondary to malrotation or even internal hernia cannot be excluded as the cause of bowel obstruction. There is noted stool and contrast in the nondilated colon, but potentially this contrast may have been from prior CT scan of May 9th. These results will be called to the ordering clinician or representative by the Radiologist Assistant, and communication documented in the PACS or zVision Dashboard. Aortic Atherosclerosis (ICD10-I70.0). Electronically Signed   By: Marijo Conception M.D.   On: 09/17/2021 12:54   CT ABDOMEN PELVIS WO CONTRAST  Result Date: 09/10/2021 CLINICAL DATA:  Abdominal pain,  vomiting, bowel obstruction EXAM: CT ABDOMEN AND PELVIS WITHOUT CONTRAST TECHNIQUE: Multidetector CT imaging of the abdomen and pelvis was performed following the standard protocol without IV contrast. RADIATION DOSE REDUCTION: This exam was performed according to the departmental dose-optimization program which includes automated exposure control, adjustment of the mA and/or kV according to patient size and/or use of iterative reconstruction technique. COMPARISON:  03/05/2020 FINDINGS: Lower chest: The visualized lung bases are clear. At least moderate coronary artery calcification. Global cardiac size within normal limits. Hepatobiliary: No focal liver abnormality is seen. No gallstones, gallbladder wall thickening, or biliary dilatation. Pancreas: Unremarkable. No pancreatic ductal dilatation or surrounding inflammatory changes. Spleen: 12 mm rim calcified cystic lesion within the a upper pole of the spleen is stable since prior examination and is safely considered benign, possibly the sequela of remote trauma or inflammation. Spleen is otherwise unremarkable. Adrenals/Urinary Tract: Adrenal glands are unremarkable. Kidneys are normal, without renal calculi, focal lesion, or hydronephrosis. Bladder is unremarkable. Stomach/Bowel: There are multiple fluid-filled dilated loops of small bowel seen proximally with a point of transition within the mid to distal small bowel within the left mid abdomen at axial image # 49/2 and coronal image # 50/5 in keeping with a mid small bowel obstruction. A discrete obstructing lesion is not identified and an underlying  adhesion is favored. There is i mildly ncomplete duodenal rotation with the ligament of Treitz seen to the left of midline, but not elevating to the level of the gastroduodenal junction, best seen on coronal image # 75/5. The distal small bowel appears decompressed as does the colon. Moderate sigmoid colonic diverticulosis is noted. The appendix is not clearly  identified, however, no secondary signs of appendicitis are seen within the right lower quadrant. Gastric lap band device is in place and appears intact and appropriately position. No free intraperitoneal gas or fluid. Vascular/Lymphatic: Aortic atherosclerosis. No enlarged abdominal or pelvic lymph nodes. Reproductive: Mild prostatic enlargement. Decompressed penile prosthetic reservoir noted within the pre vesicular space. Other: No abdominal wall hernia. Musculoskeletal: No acute bone abnormality. No lytic or blastic bone lesion. Osseous structures are age-appropriate. IMPRESSION: Mid small bowel obstruction with point of transition within the left mid abdomen. Underlying adhesions suspected. Superimposed mildly incomplete duodenal rotation. No free intraperitoneal gas or fluid identified. Status post gastric lap band placement. Device appears intact and normally position. Moderate sigmoid diverticulosis. Mild prostatic hypertrophy. Aortic Atherosclerosis (ICD10-I70.0). Electronically Signed   By: Fidela Salisbury M.D.   On: 09/10/2021 21:20   DG Abd 1 View  Result Date: 09/11/2021 CLINICAL DATA:  NG tube placement. EXAM: ABDOMEN - 1 VIEW COMPARISON:  10/08/2010 FINDINGS: 1305 hours. NG tube tip is in the mid stomach. Visualized upper abdomen reveals gaseous bowel distension. Lap band overlies the medial left upper quadrant. IMPRESSION: NG tube tip is in the mid stomach. Lap band noted in the region of the esophagogastric junction. Electronically Signed   By: Misty Stanley M.D.   On: 09/11/2021 13:13   DG Abd Portable 1V-Small Bowel Obstruction Protocol-initial, 8 hr delay  Result Date: 09/12/2021 CLINICAL DATA:  Small bowel obstruction, 8 hour delayed film. EXAM: PORTABLE ABDOMEN - 1 VIEW COMPARISON:  Radiograph yesterday.  CT 09/10/2021 FINDINGS: There is enteric contrast in the ascending, transverse, descending and rectosigmoid colon. Mild persistent gaseous small bowel distension centrally. Laparoscopic  gastric band. Enteric tube tip passes beyond the band. IMPRESSION: Enteric contrast in the ascending, transverse, descending and rectosigmoid colon. Findings consistent with resolving or partial small bowel obstruction. Mild persistent gaseous small bowel distension centrally. Electronically Signed   By: Keith Rake M.D.   On: 09/12/2021 19:53    Orson Eva, DO  [facility]  If 7PM-7AM, please contact night-coverage [email or web address] Password [facility] 09/21/2021, 4:33 PM   LOS: 11 days

## 2021-09-21 NOTE — Plan of Care (Signed)

## 2021-09-21 NOTE — Progress Notes (Signed)
Physical Therapy Treatment Patient Details Name: Kevin [name] MRN: [id number] DOB: 01-28-1946 Today's Date: 09/21/2021   History of Present Illness Kevin [name] is a 76 y.o. male with medical history significant of CAD s/p PCI, hypertension, hyperlipidemia, type 2 diabetes with who presents to the emergency department due to abdominal pain and nausea which started yesterday.  Patient complained of sudden onset of abdominal pain which was associated with nausea and dry heaving.  He complained of abdominal distention since onset of symptoms, stated to go to the ED for further evaluation and management.  Of note, patient had a lap band done in [facility] in 2011.  He denies chest pain, shortness of breath, fever, chills.    PT Comments    Patient seated in chair at beginning of session. He requires min G/A to transfer to standing along with momentum and use of RW secondary to foot pain and weakness. He ambulates increased distance today with use of RW and slight unsteadiness. Although patient performing better, patient is without available assistance at home and lives on the second floor of a house requiring that he be able to climb about 20 stairs to enter so he may need rehab before returning home. Patient will benefit from continued skilled physical therapy in hospital and recommended venue below to increase strength, balance, endurance for safe ADLs and gait.   Recommendations for follow up therapy are one component of a multi-disciplinary discharge planning process, led by the attending physician.  Recommendations may be updated based on patient status, additional functional criteria and insurance authorization.  Follow Up Recommendations  Skilled nursing-short term rehab (<3 hours/day)     Assistance Recommended at Discharge PRN  Patient can return home with the following Help with stairs or ramp for entrance;A little help with bathing/dressing/bathroom;A little help with walking and/or  transfers;Assistance with cooking/housework;Assist for transportation   Equipment Recommendations  Rolling walker (2 wheels)    Recommendations for Other Services       Precautions / Restrictions Precautions Precautions: Fall Restrictions Weight Bearing Restrictions: No     Mobility  Bed Mobility                    Transfers Overall transfer level: Needs assistance Equipment used: Rolling walker (2 wheels) Transfers: Sit to/from Stand Sit to Stand: Min guard, Min assist           General transfer comment: labored, requires several attempts and momentum to transfer to standing with RW    Ambulation/Gait Ambulation/Gait assistance: Min guard Gait Distance (Feet): 80 Feet Assistive device: Rolling walker (2 wheels) Gait Pattern/deviations: Decreased stride length, Step-through pattern Gait velocity: decreased     General Gait Details: slow, labored cadence with RW, limited by fatigue and foot pain   Stairs             Wheelchair Mobility    Modified Rankin (Stroke Patients Only)       Balance   Sitting-balance support: No upper extremity supported, Feet supported Sitting balance-Leahy Scale: Good Sitting balance - Comments: Patient able to sit edge of chair   Standing balance support: Bilateral upper extremity supported, During functional activity, Reliant on assistive device for balance Standing balance-Leahy Scale: Fair Standing balance comment: with RW                            Cognition Arousal/Alertness: Awake/alert Behavior During Therapy: WFL for tasks assessed/performed Overall Cognitive Status: Within  Functional Limits for tasks assessed                                          Exercises      General Comments        Pertinent Vitals/Pain Pain Assessment Pain Assessment: 0-10 Pain Score: 4  Pain Location: L side stomach region/ feet- 4/10 Pain Descriptors / Indicators: Aching, Discomfort,  Sore Pain Intervention(s): Limited activity within patient's tolerance, Monitored during session, Repositioned    Home Living                          Prior Function            PT Goals (current goals can now be found in the care plan section) Acute Rehab PT Goals Patient Stated Goal: return home PT Goal Formulation: With patient Time For Goal Achievement: 10/01/21 Potential to Achieve Goals: Fair Progress towards PT goals: Progressing toward goals    Frequency    Min 3X/week      PT Plan Current plan remains appropriate    Co-evaluation              AM-PAC PT "6 Clicks" Mobility   Outcome Measure  Help needed turning from your back to your side while in a flat bed without using bedrails?: A Little Help needed moving from lying on your back to sitting on the side of a flat bed without using bedrails?: A Little Help needed moving to and from a bed to a chair (including a wheelchair)?: A Little Help needed standing up from a chair using your arms (e.g., wheelchair or bedside chair)?: A Little Help needed to walk in hospital room?: A Little Help needed climbing 3-5 steps with a railing? : A Lot 6 Click Score: 17    End of Session Equipment Utilized During Treatment: Gait belt Activity Tolerance: Patient limited by pain;Patient limited by fatigue;Patient tolerated treatment well Patient left: in chair;with call bell/phone within reach;with nursing/sitter in room Nurse Communication: Mobility status PT Visit Diagnosis: Unsteadiness on feet (R26.81);Other abnormalities of gait and mobility (R26.89);Muscle weakness (generalized) (M62.81);Pain Pain - part of body: Ankle and joints of foot     Time: 1005-1021 PT Time Calculation (min) (ACUTE ONLY): 16 min  Charges:  $Therapeutic Activity: 8-22 mins                    11:01 AM, 09/21/21 Mearl Latin PT, DPT Physical Therapist at [facility], [place]

## 2021-09-21 NOTE — Assessment & Plan Note (Signed)
S/p reduction 09/20/20--Dr. Constance Haw

## 2021-09-21 NOTE — Progress Notes (Signed)
[facility]  Sitting in the chair. No pain. Says he is not nauseated. He feels good. Having flatus but no BM. No nausea.  BP 121/85 (BP Location: Left Arm)   Pulse (!) 101   Temp 98.1 F (36.7 C) (Oral)   Resp 20   Ht '5\' 9"'$  (1.753 m)   Wt 113.4 kg   SpO2 96%   BMI 36.92 kg/m  Midline incision with some staining on the honeycomb inferiorly Otherwise mildly distended, appropriately tender  Patient s/p Ex lap, reduction of volvulus, removal of gastric band. Doing well. Full liquid diet Bowel regimen Labs tomorrow Shirline Frees, MD [facility] [address] [place], [place] [address] [phone] (office)

## 2021-09-22 DIAGNOSIS — I10 Essential (primary) hypertension: Secondary | ICD-10-CM | POA: Diagnosis not present

## 2021-09-22 DIAGNOSIS — M109 Gout, unspecified: Secondary | ICD-10-CM | POA: Diagnosis not present

## 2021-09-22 DIAGNOSIS — N179 Acute kidney failure, unspecified: Secondary | ICD-10-CM | POA: Diagnosis not present

## 2021-09-22 DIAGNOSIS — K56609 Unspecified intestinal obstruction, unspecified as to partial versus complete obstruction: Secondary | ICD-10-CM | POA: Diagnosis not present

## 2021-09-22 LAB — MAGNESIUM: Magnesium: 1.9 mg/dL (ref 1.7–2.4)

## 2021-09-22 LAB — CBC WITH DIFFERENTIAL/PLATELET
Abs Immature Granulocytes: 0.11 10*3/uL — ABNORMAL HIGH (ref 0.00–0.07)
Basophils Absolute: 0 10*3/uL (ref 0.0–0.1)
Basophils Relative: 0 %
Eosinophils Absolute: 0.3 10*3/uL (ref 0.0–0.5)
Eosinophils Relative: 3 %
HCT: 35.4 % — ABNORMAL LOW (ref 39.0–52.0)
Hemoglobin: 11.7 g/dL — ABNORMAL LOW (ref 13.0–17.0)
Immature Granulocytes: 1 %
Lymphocytes Relative: 8 %
Lymphs Abs: 0.7 10*3/uL (ref 0.7–4.0)
MCH: 32.3 pg (ref 26.0–34.0)
MCHC: 33.1 g/dL (ref 30.0–36.0)
MCV: 97.8 fL (ref 80.0–100.0)
Monocytes Absolute: 0.7 10*3/uL (ref 0.1–1.0)
Monocytes Relative: 7 %
Neutro Abs: 7.6 10*3/uL (ref 1.7–7.7)
Neutrophils Relative %: 81 %
Platelets: 243 10*3/uL (ref 150–400)
RBC: 3.62 MIL/uL — ABNORMAL LOW (ref 4.22–5.81)
RDW: 13.2 % (ref 11.5–15.5)
WBC: 9.5 10*3/uL (ref 4.0–10.5)
nRBC: 0 % (ref 0.0–0.2)

## 2021-09-22 LAB — BASIC METABOLIC PANEL
Anion gap: 7 (ref 5–15)
BUN: 17 mg/dL (ref 8–23)
CO2: 22 mmol/L (ref 22–32)
Calcium: 8.3 mg/dL — ABNORMAL LOW (ref 8.9–10.3)
Chloride: 111 mmol/L (ref 98–111)
Creatinine, Ser: 1.07 mg/dL (ref 0.61–1.24)
GFR, Estimated: >60 mL/min (ref >60)
Glucose, Bld: 114 mg/dL — ABNORMAL HIGH (ref 70–99)
Potassium: 3.9 mmol/L (ref 3.5–5.1)
Sodium: 140 mmol/L (ref 135–145)

## 2021-09-22 LAB — GLUCOSE, CAPILLARY
Glucose-Capillary: 115 mg/dL — ABNORMAL HIGH (ref 70–99)
Glucose-Capillary: 117 mg/dL — ABNORMAL HIGH (ref 70–99)
Glucose-Capillary: 119 mg/dL — ABNORMAL HIGH (ref 70–99)
Glucose-Capillary: 123 mg/dL — ABNORMAL HIGH (ref 70–99)
Glucose-Capillary: 151 mg/dL — ABNORMAL HIGH (ref 70–99)
Glucose-Capillary: 233 mg/dL — ABNORMAL HIGH (ref 70–99)

## 2021-09-22 LAB — URINE CULTURE: Culture: NO GROWTH

## 2021-09-22 MED ORDER — FUROSEMIDE 40 MG PO TABS
40.0000 mg | ORAL_TABLET | Freq: Every day | ORAL | Status: DC
  Administered 2021-09-23: 40 mg via ORAL
  Filled 2021-09-22: qty 1

## 2021-09-22 MED ORDER — TAMSULOSIN HCL 0.4 MG PO CAPS
0.4000 mg | ORAL_CAPSULE | Freq: Every day | ORAL | Status: DC
  Administered 2021-09-22: 0.4 mg via ORAL
  Filled 2021-09-22: qty 1

## 2021-09-22 MED ORDER — METOPROLOL TARTRATE 50 MG PO TABS
50.0000 mg | ORAL_TABLET | Freq: Two times a day (BID) | ORAL | Status: DC
Start: 2021-09-22 — End: 2021-09-23
  Administered 2021-09-22 – 2021-09-23 (×2): 50 mg via ORAL
  Filled 2021-09-22: qty 1

## 2021-09-22 MED ORDER — METHYLPREDNISOLONE SODIUM SUCC 40 MG IJ SOLR
40.0000 mg | Freq: Once | INTRAMUSCULAR | Status: AC
Start: 2021-09-22 — End: 2021-09-22
  Administered 2021-09-22: 40 mg via INTRAVENOUS
  Filled 2021-09-22: qty 1

## 2021-09-22 MED ORDER — CLOPIDOGREL BISULFATE 75 MG PO TABS
75.0000 mg | ORAL_TABLET | Freq: Every day | ORAL | Status: DC
  Administered 2021-09-22 – 2021-09-23 (×2): 75 mg via ORAL
  Filled 2021-09-22 (×2): qty 1

## 2021-09-22 MED ORDER — PRAVASTATIN SODIUM 40 MG PO TABS
40.0000 mg | ORAL_TABLET | Freq: Every day | ORAL | Status: DC
  Administered 2021-09-22: 40 mg via ORAL
  Filled 2021-09-22: qty 1

## 2021-09-22 MED ORDER — METOPROLOL TARTRATE 50 MG PO TABS
50.0000 mg | ORAL_TABLET | Freq: Two times a day (BID) | ORAL | Status: DC
  Filled 2021-09-22: qty 1

## 2021-09-22 MED ORDER — ASPIRIN 81 MG PO CHEW
81.0000 mg | CHEWABLE_TABLET | Freq: Every day | ORAL | Status: DC
  Administered 2021-09-22 – 2021-09-23 (×2): 81 mg via ORAL
  Filled 2021-09-22 (×2): qty 1

## 2021-09-22 MED ORDER — LEVOTHYROXINE SODIUM 50 MCG PO TABS
50.0000 ug | ORAL_TABLET | Freq: Every day | ORAL | Status: DC
  Administered 2021-09-23: 50 ug via ORAL
  Filled 2021-09-22: qty 1

## 2021-09-22 NOTE — Progress Notes (Signed)
[facility]  Doing good with full liquids. Had Bms x2. No nausea. Feeling good.  BP (!) 175/72   Pulse 85   Temp 97.7 F (36.5 C)   Resp 18   Ht '5\' 9"'$  (1.753 m)   Wt 113.4 kg   SpO2 96%   BMI 36.92 kg/m  Midline dressing removed. Staples c/d/I with no erythema, some bruising/  irritation on the inferior edge and minor skin edge bleeding, gauze and tape placed  Patient s/p Ex lap, reduction of volvulus, removal of gastric band. Doing well. Diabetic diet Bowel regimen [facility]  Rehab in the next day or so likely pending approval.  Curlene Labrum, MD [facility] [address] [place], [place] [address] [phone] (office)

## 2021-09-22 NOTE — Progress Notes (Signed)
PROGRESS NOTE  PARKE JANDREAU QBH:[id number] DOB: 1945-12-15 DOA: 09/10/2021 PCP: Sharilyn Sites, MD  Brief History:  Kevin [name] is a 76 y.o. male with medical history significant of CAD s/p PCI, hypertension, hyperlipidemia, type 2 diabetes with who presents to the emergency department due to abdominal pain and nausea which started yesterday.  Patient complained of sudden onset of abdominal pain which was associated with nausea and dry heaving.  He complained of abdominal distention since onset of symptoms, stated to go to the ED for further evaluation and management.  Of note, patient had a lap band done in [facility] in 2011.  He denies chest pain, shortness of breath, fever, chills.   ED Course:  In the emergency department, he was hemodynamically stable.  Work-up in the ED showed normal CBC. BMP showed BUN/creatinine 95/3.42 (baseline creatinine at 1.1-1.6), CBG 279, lipase 38. CT abdomen and pelvis without contrast showed Mid small bowel obstruction with point of transition within the left mid abdomen. Underlying adhesions suspected. Superimposed mildly incomplete duodenal rotation. No free intraperitoneal gas or fluid identified. He was treated with IV fentanyl and Zofran.  General surgery was consulted and recommended admitting patient with plan to see patient in the morning per ED physician.  Hospitalist was asked to admit patient for further evaluation and management      Assessment and Plan: * Small bowel obstruction ([place]) -Reporting experiencing once again severe abdominal pain with associated nausea and dry heaving.  5/16 CT abd--continued small bowel dilatation with transition zone seen in the pelvis;  noted twisting of mesenteric structures in this area, and the possibility of closed loop obstruction secondary to malrotation or even internal hernia cannot be excluded as the cause of bowel obstruction -5/19--: Exploratory laparotomy, reduction of volvulus, lysis of  adhesions, removal of gastric band  5/20--full liquid diet, increase activity 5/21--carb modified diet  Volvulus of jejunum ([place]) S/p reduction 09/20/20--Dr. Constance Haw  Gouty arthritis of both feet Improving with steroids started on 5/15 Repeat uric acid 5/22 Give additional one time dose solumedrol 5/21  Type 2 diabetes mellitus with hyperglycemia ([place]) -With nephropathy complication (chronic kidney disease stage IIIa). -Continue sliding scale insulin  -Continue to hold oral hypoglycemic agents while inpatient. -Initiation of his steroids could potentially increase CBGs; will follow trend and adjust management as required. 09/11/21 A1C--7.7 --discontinue semglee as CBGs trending down  Acute renal failure superimposed on stage 3a chronic kidney disease (HCC) -baseline creatinine of 1.1-1.4 -serum creatinine peaked 3.42 -Continue minimizing nephrotoxic agents -Continue to maintain adequate hydration. -In the setting of prerenal azotemia; volume depletion -improved with IVF  Obesity (BMI 30-39.9) -Body mass index is 37.45 kg/m. -Patient with prior history of lap band placement in 2011.  Mixed hyperlipidemia -Continue statins when able to tolerate p.o.'s again. -Heart healthy diet discussed with patient.  Essential hypertension -Blood pressure controlled -Continue as needed hydralazine -Continue IV metoprolol until tolerating po>>now transition to po 5/21  Coronary artery disease, hx LAD and LCX stenting in 2001 & 2004, last cath 2009 with patent stents. -No chest pain currently -Continue IV metoprolol>>convert to po metoprolol  -Aspirin and Plavix on hold until able to tolerate p.o.'s. and for surgery -5/21--restart ASA and plavix      Family Communication:  no Family at bedside  Consultants:  general surgery  Code Status:  FULL   DVT Prophylaxis:  SCDs   Procedures: As Listed in Progress Note Above  Antibiotics: None  Subjective: Patient complains of  left foot pain.  Patient denies fevers, chills, headache, chest pain, dyspnea, nausea, vomiting, diarrhea, abdominal pain, dysuria, hematuria, hematochezia, and melena.   Objective: Vitals:   09/21/21 1818 09/21/21 2030 09/22/21 0354 09/22/21 1501  BP: (!) 154/84 (!) 151/80 (!) 175/72 140/75  Pulse: 95 79 85 79  Resp: 18   18  Temp:  98.3 F (36.8 C) 97.7 F (36.5 C) 97.7 F (36.5 C)  TempSrc:      SpO2: 98% 100% 96% 100%  Weight:      Height:        Intake/Output Summary (Last 24 hours) at 09/22/2021 1701 Last data filed at 09/22/2021 1500 Gross per 24 hour  Intake 1340 ml  Output 800 ml  Net 540 ml   Weight change:  Exam:  General:  Pt is alert, follows commands appropriately, not in acute distress HEENT: No icterus, No thrush, No neck mass, [place]/AT Cardiovascular: RRR, S1/S2, no rubs, no gallops Respiratory: CTA bilaterally, no wheezing, no crackles, no rhonchi Abdomen: Soft/+BS, non tender, non distended, no guarding Extremities: 1 + LEedema, No lymphangitis, No petechiae, No rashes, no synovitis   Data Reviewed: I have personally reviewed following labs and imaging studies Basic Metabolic Panel: Recent Labs  Lab 09/16/21 0455 09/18/21 0528 09/22/21 0425  NA 141 138 140  K 4.1 4.4 3.9  CL 106 103 111  CO2 '28 27 22  '$ GLUCOSE 110* 141* 114*  BUN 38* 34* 17  CREATININE 1.54* 1.29* 1.07  CALCIUM 9.3 8.9 8.3*  MG  --   --  1.9   Liver Function Tests: No results for input(s): AST, ALT, ALKPHOS, BILITOT, PROT, ALBUMIN in the last 168 hours. No results for input(s): LIPASE, AMYLASE in the last 168 hours. No results for input(s): AMMONIA in the last 168 hours. Coagulation Profile: No results for input(s): INR, PROTIME in the last 168 hours. CBC: Recent Labs  Lab 09/18/21 0528 09/22/21 0425  WBC 7.1 9.5  NEUTROABS  --  7.6  HGB 10.9* 11.7*  HCT 33.3* 35.4*  MCV 96.5 97.8  PLT 188 243   Cardiac Enzymes: No results for input(s): CKTOTAL, CKMB, CKMBINDEX,  TROPONINI in the last 168 hours. BNP: Invalid input(s): POCBNP CBG: Recent Labs  Lab 09/21/21 2033 09/22/21 0021 09/22/21 0355 09/22/21 0723 09/22/21 1127  GLUCAP 144* 123* 115* 117* 151*   HbA1C: No results for input(s): HGBA1C in the last 72 hours. Urine analysis:    Component Value Date/Time   COLORURINE YELLOW 09/20/2021 1750   APPEARANCEUR HAZY (A) 09/20/2021 1750   LABSPEC 1.015 09/20/2021 1750   PHURINE 5.0 09/20/2021 1750   GLUCOSEU 150 (A) 09/20/2021 1750   HGBUR LARGE (A) 09/20/2021 1750   BILIRUBINUR NEGATIVE 09/20/2021 1750   KETONESUR 5 (A) 09/20/2021 1750   PROTEINUR 30 (A) 09/20/2021 1750   UROBILINOGEN 0.2 10/01/2010 1544   NITRITE NEGATIVE 09/20/2021 1750   LEUKOCYTESUR NEGATIVE 09/20/2021 1750   Sepsis Labs: '@LABRCNTIP'$ (procalcitonin:4,lacticidven:4) ) Recent Results (from the past 240 hour(s))  Urine Culture     Status: None   Collection Time: 09/20/21  5:50 PM   Specimen: Urine, Clean Catch  Result Value Ref Range Status   Specimen Description   Final    URINE, CLEAN CATCH Performed at [facility], [address] [place], [place] [address]    Special Requests   Final    NONE Performed at [facility], [address] [place],  [address]    Culture   Final  NO GROWTH Performed at [facility], [place] [address] [place], [place] [address]    Report Status 09/22/2021 FINAL  Final     Scheduled Meds:  bisacodyl  10 mg Rectal Daily   Chlorhexidine Gluconate Cloth  6 each Topical Once   docusate sodium  100 mg Oral BID   [START ON 09/23/2021] furosemide  40 mg Oral Daily   insulin aspart  0-9 Units Subcutaneous TID WC   [START ON 09/23/2021] levothyroxine  50 mcg Oral Q0600   methylPREDNISolone (SOLU-MEDROL) injection  40 mg Intravenous Once   metoprolol tartrate  2.5 mg Intravenous Q6H   metoprolol tartrate  50 mg Oral BID   pantoprazole (PROTONIX) IV  40 mg Intravenous Q12H   polyethylene glycol  17 g Oral Daily   pravastatin   40 mg Oral q1800   tamsulosin  0.4 mg Oral QPC supper   Continuous Infusions:  Procedures/Studies: CT ABDOMEN PELVIS WO CONTRAST  Result Date: 09/17/2021 CLINICAL DATA:  Acute generalized abdominal pain. EXAM: CT ABDOMEN AND PELVIS WITHOUT CONTRAST TECHNIQUE: Multidetector CT imaging of the abdomen and pelvis was performed following the standard protocol without IV contrast. RADIATION DOSE REDUCTION: This exam was performed according to the departmental dose-optimization program which includes automated exposure control, adjustment of the mA and/or kV according to patient size and/or use of iterative reconstruction technique. COMPARISON:  Sep 10, 2021. FINDINGS: Lower chest: No acute abnormality. Hepatobiliary: No focal liver abnormality is seen. No gallstones, gallbladder wall thickening, or biliary dilatation. Pancreas: Unremarkable. No pancreatic ductal dilatation or surrounding inflammatory changes. Spleen: Stable small calcified cystic lesion is seen in the spleen. No other abnormality is noted. Adrenals/Urinary Tract: Adrenal glands are unremarkable. Kidneys are normal, without renal calculi, focal lesion, or hydronephrosis. Bladder is unremarkable. Stomach/Bowel: Lap band device is again noted and unchanged in position. Stomach is otherwise unremarkable. Stool and contrast are seen throughout the nondilated colon, although contrast may be from previous CT exam. However, there is continued small bowel dilatation with transition zone seen in the pelvis, best seen on image number 69 of series 2. There is seen some twisting of mesenteric structures and this is concerning for possible malrotation or possible internal hernia. Vascular/Lymphatic: Aortic atherosclerosis. No enlarged abdominal or pelvic lymph nodes. Reproductive: Mild prostatic enlargement is noted. Penile reservoir is noted anteriorly in the pelvis. Other: No abdominal wall hernia or abnormality. No abdominopelvic ascites. Musculoskeletal: No  acute or significant osseous findings. IMPRESSION: There is continued small bowel dilatation with transition zone seen in the pelvis. While this may simply be due to adhesion, there is noted twisting of mesenteric structures in this area, and the possibility of closed loop obstruction secondary to malrotation or even internal hernia cannot be excluded as the cause of bowel obstruction. There is noted stool and contrast in the nondilated colon, but potentially this contrast may have been from prior CT scan of May 9th. These results will be called to the ordering clinician or representative by the Radiologist Assistant, and communication documented in the PACS or zVision Dashboard. Aortic Atherosclerosis (ICD10-I70.0). Electronically Signed   By: Marijo Conception M.D.   On: 09/17/2021 12:54   CT ABDOMEN PELVIS WO CONTRAST  Result Date: 09/10/2021 CLINICAL DATA:  Abdominal pain, vomiting, bowel obstruction EXAM: CT ABDOMEN AND PELVIS WITHOUT CONTRAST TECHNIQUE: Multidetector CT imaging of the abdomen and pelvis was performed following the standard protocol without IV contrast. RADIATION DOSE REDUCTION: This exam was performed according to the departmental dose-optimization program which  includes automated exposure control, adjustment of the mA and/or kV according to patient size and/or use of iterative reconstruction technique. COMPARISON:  03/05/2020 FINDINGS: Lower chest: The visualized lung bases are clear. At least moderate coronary artery calcification. Global cardiac size within normal limits. Hepatobiliary: No focal liver abnormality is seen. No gallstones, gallbladder wall thickening, or biliary dilatation. Pancreas: Unremarkable. No pancreatic ductal dilatation or surrounding inflammatory changes. Spleen: 12 mm rim calcified cystic lesion within the a upper pole of the spleen is stable since prior examination and is safely considered benign, possibly the sequela of remote trauma or inflammation. Spleen is  otherwise unremarkable. Adrenals/Urinary Tract: Adrenal glands are unremarkable. Kidneys are normal, without renal calculi, focal lesion, or hydronephrosis. Bladder is unremarkable. Stomach/Bowel: There are multiple fluid-filled dilated loops of small bowel seen proximally with a point of transition within the mid to distal small bowel within the left mid abdomen at axial image # 49/2 and coronal image # 50/5 in keeping with a mid small bowel obstruction. A discrete obstructing lesion is not identified and an underlying adhesion is favored. There is i mildly ncomplete duodenal rotation with the ligament of Treitz seen to the left of midline, but not elevating to the level of the gastroduodenal junction, best seen on coronal image # 75/5. The distal small bowel appears decompressed as does the colon. Moderate sigmoid colonic diverticulosis is noted. The appendix is not clearly identified, however, no secondary signs of appendicitis are seen within the right lower quadrant. Gastric lap band device is in place and appears intact and appropriately position. No free intraperitoneal gas or fluid. Vascular/Lymphatic: Aortic atherosclerosis. No enlarged abdominal or pelvic lymph nodes. Reproductive: Mild prostatic enlargement. Decompressed penile prosthetic reservoir noted within the pre vesicular space. Other: No abdominal wall hernia. Musculoskeletal: No acute bone abnormality. No lytic or blastic bone lesion. Osseous structures are age-appropriate. IMPRESSION: Mid small bowel obstruction with point of transition within the left mid abdomen. Underlying adhesions suspected. Superimposed mildly incomplete duodenal rotation. No free intraperitoneal gas or fluid identified. Status post gastric lap band placement. Device appears intact and normally position. Moderate sigmoid diverticulosis. Mild prostatic hypertrophy. Aortic Atherosclerosis (ICD10-I70.0). Electronically Signed   By: Fidela Salisbury M.D.   On: 09/10/2021 21:20    DG Abd 1 View  Result Date: 09/11/2021 CLINICAL DATA:  NG tube placement. EXAM: ABDOMEN - 1 VIEW COMPARISON:  10/08/2010 FINDINGS: 1305 hours. NG tube tip is in the mid stomach. Visualized upper abdomen reveals gaseous bowel distension. Lap band overlies the medial left upper quadrant. IMPRESSION: NG tube tip is in the mid stomach. Lap band noted in the region of the esophagogastric junction. Electronically Signed   By: Misty Stanley M.D.   On: 09/11/2021 13:13   DG Abd Portable 1V-Small Bowel Obstruction Protocol-initial, 8 hr delay  Result Date: 09/12/2021 CLINICAL DATA:  Small bowel obstruction, 8 hour delayed film. EXAM: PORTABLE ABDOMEN - 1 VIEW COMPARISON:  Radiograph yesterday.  CT 09/10/2021 FINDINGS: There is enteric contrast in the ascending, transverse, descending and rectosigmoid colon. Mild persistent gaseous small bowel distension centrally. Laparoscopic gastric band. Enteric tube tip passes beyond the band. IMPRESSION: Enteric contrast in the ascending, transverse, descending and rectosigmoid colon. Findings consistent with resolving or partial small bowel obstruction. Mild persistent gaseous small bowel distension centrally. Electronically Signed   By: Keith Rake M.D.   On: 09/12/2021 19:53    Kevin Eva, DO  [facility]  If 7PM-7AM, please contact night-coverage [email or web address] Password [facility] 09/22/2021, 5:01 PM  LOS: 12 days

## 2021-09-23 ENCOUNTER — Encounter (HOSPITAL_COMMUNITY): Payer: Self-pay | Admitting: General Surgery

## 2021-09-23 DIAGNOSIS — I10 Essential (primary) hypertension: Secondary | ICD-10-CM | POA: Diagnosis not present

## 2021-09-23 DIAGNOSIS — K56609 Unspecified intestinal obstruction, unspecified as to partial versus complete obstruction: Secondary | ICD-10-CM | POA: Diagnosis not present

## 2021-09-23 DIAGNOSIS — K562 Volvulus: Secondary | ICD-10-CM

## 2021-09-23 DIAGNOSIS — E669 Obesity, unspecified: Secondary | ICD-10-CM | POA: Diagnosis not present

## 2021-09-23 DIAGNOSIS — N179 Acute kidney failure, unspecified: Secondary | ICD-10-CM | POA: Diagnosis not present

## 2021-09-23 LAB — BPAM RBC
Blood Product Expiration Date: 202306142359
Blood Product Expiration Date: 202306162359
Unit Type and Rh: 6200
Unit Type and Rh: 6200

## 2021-09-23 LAB — TYPE AND SCREEN
ABO/RH(D): A POS
Antibody Screen: NEGATIVE
Unit division: 0
Unit division: 0

## 2021-09-23 LAB — BASIC METABOLIC PANEL
Anion gap: 3 — ABNORMAL LOW (ref 5–15)
BUN: 16 mg/dL (ref 8–23)
CO2: 24 mmol/L (ref 22–32)
Calcium: 8.4 mg/dL — ABNORMAL LOW (ref 8.9–10.3)
Chloride: 112 mmol/L — ABNORMAL HIGH (ref 98–111)
Creatinine, Ser: 1.22 mg/dL (ref 0.61–1.24)
GFR, Estimated: >60 mL/min (ref >60)
Glucose, Bld: 194 mg/dL — ABNORMAL HIGH (ref 70–99)
Potassium: 4.4 mmol/L (ref 3.5–5.1)
Sodium: 139 mmol/L (ref 135–145)

## 2021-09-23 LAB — SURGICAL PATHOLOGY

## 2021-09-23 LAB — GLUCOSE, CAPILLARY
Glucose-Capillary: 167 mg/dL — ABNORMAL HIGH (ref 70–99)
Glucose-Capillary: 133 mg/dL — ABNORMAL HIGH (ref 70–99)

## 2021-09-23 LAB — URIC ACID: Uric Acid, Serum: 7.9 mg/dL (ref 3.7–8.6)

## 2021-09-23 MED ORDER — TRAMADOL HCL 50 MG PO TABS: 50.0000 mg | ORAL_TABLET | Freq: Four times a day (QID) | ORAL | 0 refills | Status: DC | PRN

## 2021-09-23 MED ORDER — PANTOPRAZOLE SODIUM 40 MG PO TBEC
40.0000 mg | DELAYED_RELEASE_TABLET | Freq: Two times a day (BID) | ORAL | Status: DC
Start: 2021-09-23 — End: 2021-09-23
  Administered 2021-09-23: 40 mg via ORAL
  Filled 2021-09-23: qty 1

## 2021-09-23 NOTE — Discharge Summary (Signed)
Physician Discharge Summary   Patient: Kevin [name] MRN: [id number] DOB: 1946-03-22  Admit date:     09/10/2021  Discharge date: 09/23/21  Discharge Physician: Shanon Brow Barbara Keng   PCP: Sharilyn Sites, MD   Recommendations at discharge:   Please follow up with primary care provider within 1-2 weeks  Please repeat BMP and CBC in one week   Hospital Course: Kevin [name] is a 76 y.o. male with medical history significant of CAD s/p PCI, hypertension, hyperlipidemia, type 2 diabetes with who presents to the emergency department due to abdominal pain and nausea which started yesterday.  Patient complained of sudden onset of abdominal pain which was associated with nausea and dry heaving.  He complained of abdominal distention since onset of symptoms, stated to go to the ED for further evaluation and management.  Of note, patient had a lap band done in [facility] in 2011.  He denies chest pain, shortness of breath, fever, chills.   ED Course:  In the emergency department, he was hemodynamically stable.  Work-up in the ED showed normal CBC. BMP showed BUN/creatinine 95/3.42 (baseline creatinine at 1.1-1.6), CBG 279, lipase 38. CT abdomen and pelvis without contrast showed Mid small bowel obstruction with point of transition within the left mid abdomen. Underlying adhesions suspected. Superimposed mildly incomplete duodenal rotation. No free intraperitoneal gas or fluid identified. He was treated with IV fentanyl and Zofran.  General surgery was consulted and recommended admitting patient with plan to see patient in the morning per ED physician.  Hospitalist was asked to admit patient for further evaluation and management    Assessment and Plan: * Small bowel obstruction ([place]) -Reporting experiencing once again severe abdominal pain with associated nausea and dry heaving.  5/16 CT abd--continued small bowel dilatation with transition zone seen in the pelvis;  noted twisting of mesenteric structures in this  area, and the possibility of closed loop obstruction secondary to malrotation or even internal hernia cannot be excluded as the cause of bowel obstruction -5/19--: Exploratory laparotomy, reduction of volvulus, lysis of adhesions, removal of gastric band  5/20--full liquid diet, increase activity 5/21--carb modified diet which patient tolerated  Volvulus of jejunum ([place]) S/p reduction 09/20/20--Dr. Constance Haw  Gouty arthritis of both feet Improving with steroids started on 5/15 Repeat uric acid 5/22--7.9 Received 6 days of steroids during hospitalization Overall improved  Type 2 diabetes mellitus with hyperglycemia ([place]) -With nephropathy complication (chronic kidney disease stage IIIa). -Continue sliding scale insulin  -Continue to hold oral hypoglycemic agents while inpatient. -Initiation of his steroids could potentially increase CBGs; will follow trend and adjust management as required. 09/11/21 A1C--7.7 --discontinue semglee as CBGs trending down --restart glipizide and januvia at time of dc  Acute renal failure superimposed on stage 3a chronic kidney disease (HCC) -baseline creatinine of 1.1-1.4 -serum creatinine peaked 3.42 -Continue minimizing nephrotoxic agents -Continue to maintain adequate hydration. -In the setting of prerenal azotemia; volume depletion -improved with IVF -serum creatinine 1.22 at time of d/c  Obesity (BMI 30-39.9) -Body mass index is 37.45 kg/m. -Patient with prior history of lap band placement in 2011.  Mixed hyperlipidemia -Continue statins when able to tolerate p.o.'s again. -Heart healthy diet discussed with patient.  Essential hypertension -Blood pressure controlled but gradually climbing back up at time of d/c -Continue as needed hydralazine -Continue IV metoprolol until tolerating po>>now transition to po 5/21 Restart valsartan   Coronary artery disease, hx LAD and LCX stenting in 2001 & 2004, last cath 2009 with patent stents. -No  chest pain currently -Continue IV metoprolol>>convert to po metoprolol  -Aspirin and Plavix on hold until able to tolerate p.o.'s. and for surgery -5/21--restart ASA and plavix         Consultants: general surgery Procedures performed: ex lap with reduction of volvulus and removal of lap band 5/19 Disposition: Home Diet recommendation:  Cardiac diet DISCHARGE MEDICATION: Allergies as of 09/23/2021       Reactions   Altace [ramipril] Shortness Of Breath   Avapro [irbesartan] Shortness Of Breath   Prednisone Anaphylaxis, Swelling, Other (See Comments)   SWELLING OF THE LIPS   Shellfish Allergy Anaphylaxis   Shrimp, selfish   Tradjenta [linagliptin] Shortness Of Breath   Cephalexin Hives, Swelling   Clindamycin Hcl Hives, Swelling   Codeine Nausea And Vomiting, Other (See Comments)   AFFECTS HEART   Contrast Media [iodinated Contrast Media]    Can be administered if Benadryl is administered prior to prevent reaction    Fenofibrate Other (See Comments)   "My skin started peeling off"   Glycotrol [support-500] Other (See Comments)   unknown   Indomethacin Other (See Comments)   UNKNOWN REACTION   Minocycline Hcl Hives, Swelling   Morphine And Related Nausea And Vomiting   PROJECTILE VOMITING   Sulfa Drugs Cross Reactors Hives, Swelling   Tetracyclines & Related    Tree Extract Itching        Medication List     TAKE these medications    aspirin 81 MG tablet Take 81 mg by mouth daily.   clopidogrel 75 MG tablet Commonly known as: PLAVIX TAKE ONE TABLET BY MOUTH ONCE DAILY.   Co Q 10 10 MG Caps Take 1 capsule by mouth daily.   diphenhydrAMINE 25 MG tablet Commonly known as: BENADRYL Take 25 mg by mouth every 6 (six) hours as needed.   fluticasone 50 MCG/ACT nasal spray Commonly known as: FLONASE Place 2 sprays into both nostrils daily.   furosemide 40 MG tablet Commonly known as: LASIX Take 40 mg by mouth daily.   glipiZIDE 5 MG tablet Commonly  known as: GLUCOTROL Take 5 mg by mouth daily as needed.   levothyroxine 50 MCG tablet Commonly known as: SYNTHROID Take 50 mcg by mouth daily before breakfast.   metoprolol succinate 100 MG 24 hr tablet Commonly known as: TOPROL-XL Take 100 mg by mouth daily.   niacin 1000 MG CR tablet Commonly known as: NIASPAN TAKE 1 TABLET BY MOUTH AT BEDTIME.   pravastatin 40 MG tablet Commonly known as: PRAVACHOL Take 1 tablet (40 mg total) by mouth every evening.   RESVERATROL PO Take 1 capsule by mouth every morning.   sitaGLIPtin 100 MG tablet Commonly known as: JANUVIA Take 100 mg by mouth daily.   tamsulosin 0.4 MG Caps capsule Commonly known as: FLOMAX Take 0.4 mg by mouth daily.   traMADol 50 MG tablet Commonly known as: ULTRAM Take 1 tablet (50 mg total) by mouth every 6 (six) hours as needed for moderate pain.   valsartan-hydrochlorothiazide 160-12.5 MG tablet Commonly known as: DIOVAN-HCT Take 1 tablet by mouth daily.   vitamin C 1000 MG tablet Take 1,000 mg by mouth daily.        Contact information for follow-up providers     Virl Cagey, MD Follow up on 10/03/2021.   Specialty: General Surgery Why: For suture removal Contact information: [address] Linna Hoff [facility] [address] [phone]              Contact information for after-discharge  care     [facility] .   Service: Skilled Nursing Contact information: [address] [place] [phone]                    Discharge Exam: Danley Danker Weights   09/10/21 1729 09/10/21 2327 09/20/21 0805  Weight: 117.9 kg 116.7 kg 113.4 kg   HEENT:  [place]/AT, No thrush, no icterus CV:  RRR, no rub, no S3, no S4 Lung:  CTA, no wheeze, no rhonchi Abd:  soft/+BS, NT Ext:  1 +LE edema, no lymphangitis, no synovitis, no rash   Condition at discharge: stable  The results of significant diagnostics from this  hospitalization (including imaging, microbiology, ancillary and laboratory) are listed below for reference.   Imaging Studies: CT ABDOMEN PELVIS WO CONTRAST  Result Date: 09/17/2021 CLINICAL DATA:  Acute generalized abdominal pain. EXAM: CT ABDOMEN AND PELVIS WITHOUT CONTRAST TECHNIQUE: Multidetector CT imaging of the abdomen and pelvis was performed following the standard protocol without IV contrast. RADIATION DOSE REDUCTION: This exam was performed according to the departmental dose-optimization program which includes automated exposure control, adjustment of the mA and/or kV according to patient size and/or use of iterative reconstruction technique. COMPARISON:  Sep 10, 2021. FINDINGS: Lower chest: No acute abnormality. Hepatobiliary: No focal liver abnormality is seen. No gallstones, gallbladder wall thickening, or biliary dilatation. Pancreas: Unremarkable. No pancreatic ductal dilatation or surrounding inflammatory changes. Spleen: Stable small calcified cystic lesion is seen in the spleen. No other abnormality is noted. Adrenals/Urinary Tract: Adrenal glands are unremarkable. Kidneys are normal, without renal calculi, focal lesion, or hydronephrosis. Bladder is unremarkable. Stomach/Bowel: Lap band device is again noted and unchanged in position. Stomach is otherwise unremarkable. Stool and contrast are seen throughout the nondilated colon, although contrast may be from previous CT exam. However, there is continued small bowel dilatation with transition zone seen in the pelvis, best seen on image number 69 of series 2. There is seen some twisting of mesenteric structures and this is concerning for possible malrotation or possible internal hernia. Vascular/Lymphatic: Aortic atherosclerosis. No enlarged abdominal or pelvic lymph nodes. Reproductive: Mild prostatic enlargement is noted. Penile reservoir is noted anteriorly in the pelvis. Other: No abdominal wall hernia or abnormality. No abdominopelvic  ascites. Musculoskeletal: No acute or significant osseous findings. IMPRESSION: There is continued small bowel dilatation with transition zone seen in the pelvis. While this may simply be due to adhesion, there is noted twisting of mesenteric structures in this area, and the possibility of closed loop obstruction secondary to malrotation or even internal hernia cannot be excluded as the cause of bowel obstruction. There is noted stool and contrast in the nondilated colon, but potentially this contrast may have been from prior CT scan of May 9th. These results will be called to the ordering clinician or representative by the Radiologist Assistant, and communication documented in the PACS or zVision Dashboard. Aortic Atherosclerosis (ICD10-I70.0). Electronically Signed   By: Marijo Conception M.D.   On: 09/17/2021 12:54   CT ABDOMEN PELVIS WO CONTRAST  Result Date: 09/10/2021 CLINICAL DATA:  Abdominal pain, vomiting, bowel obstruction EXAM: CT ABDOMEN AND PELVIS WITHOUT CONTRAST TECHNIQUE: Multidetector CT imaging of the abdomen and pelvis was performed following the standard protocol without IV contrast. RADIATION DOSE REDUCTION: This exam was performed according to the departmental dose-optimization program which includes automated exposure control, adjustment of the mA and/or kV according to patient size and/or use of iterative  reconstruction technique. COMPARISON:  03/05/2020 FINDINGS: Lower chest: The visualized lung bases are clear. At least moderate coronary artery calcification. Global cardiac size within normal limits. Hepatobiliary: No focal liver abnormality is seen. No gallstones, gallbladder wall thickening, or biliary dilatation. Pancreas: Unremarkable. No pancreatic ductal dilatation or surrounding inflammatory changes. Spleen: 12 mm rim calcified cystic lesion within the a upper pole of the spleen is stable since prior examination and is safely considered benign, possibly the sequela of remote trauma  or inflammation. Spleen is otherwise unremarkable. Adrenals/Urinary Tract: Adrenal glands are unremarkable. Kidneys are normal, without renal calculi, focal lesion, or hydronephrosis. Bladder is unremarkable. Stomach/Bowel: There are multiple fluid-filled dilated loops of small bowel seen proximally with a point of transition within the mid to distal small bowel within the left mid abdomen at axial image # 49/2 and coronal image # 50/5 in keeping with a mid small bowel obstruction. A discrete obstructing lesion is not identified and an underlying adhesion is favored. There is i mildly ncomplete duodenal rotation with the ligament of Treitz seen to the left of midline, but not elevating to the level of the gastroduodenal junction, best seen on coronal image # 75/5. The distal small bowel appears decompressed as does the colon. Moderate sigmoid colonic diverticulosis is noted. The appendix is not clearly identified, however, no secondary signs of appendicitis are seen within the right lower quadrant. Gastric lap band device is in place and appears intact and appropriately position. No free intraperitoneal gas or fluid. Vascular/Lymphatic: Aortic atherosclerosis. No enlarged abdominal or pelvic lymph nodes. Reproductive: Mild prostatic enlargement. Decompressed penile prosthetic reservoir noted within the pre vesicular space. Other: No abdominal wall hernia. Musculoskeletal: No acute bone abnormality. No lytic or blastic bone lesion. Osseous structures are age-appropriate. IMPRESSION: Mid small bowel obstruction with point of transition within the left mid abdomen. Underlying adhesions suspected. Superimposed mildly incomplete duodenal rotation. No free intraperitoneal gas or fluid identified. Status post gastric lap band placement. Device appears intact and normally position. Moderate sigmoid diverticulosis. Mild prostatic hypertrophy. Aortic Atherosclerosis (ICD10-I70.0). Electronically Signed   By: Fidela Salisbury  M.D.   On: 09/10/2021 21:20   DG Abd 1 View  Result Date: 09/11/2021 CLINICAL DATA:  NG tube placement. EXAM: ABDOMEN - 1 VIEW COMPARISON:  10/08/2010 FINDINGS: 1305 hours. NG tube tip is in the mid stomach. Visualized upper abdomen reveals gaseous bowel distension. Lap band overlies the medial left upper quadrant. IMPRESSION: NG tube tip is in the mid stomach. Lap band noted in the region of the esophagogastric junction. Electronically Signed   By: Misty Stanley M.D.   On: 09/11/2021 13:13   DG Abd Portable 1V-Small Bowel Obstruction Protocol-initial, 8 hr delay  Result Date: 09/12/2021 CLINICAL DATA:  Small bowel obstruction, 8 hour delayed film. EXAM: PORTABLE ABDOMEN - 1 VIEW COMPARISON:  Radiograph yesterday.  CT 09/10/2021 FINDINGS: There is enteric contrast in the ascending, transverse, descending and rectosigmoid colon. Mild persistent gaseous small bowel distension centrally. Laparoscopic gastric band. Enteric tube tip passes beyond the band. IMPRESSION: Enteric contrast in the ascending, transverse, descending and rectosigmoid colon. Findings consistent with resolving or partial small bowel obstruction. Mild persistent gaseous small bowel distension centrally. Electronically Signed   By: Keith Rake M.D.   On: 09/12/2021 19:53    Microbiology: Results for orders placed or performed during the hospital encounter of 09/10/21  Urine Culture     Status: None   Collection Time: 09/20/21  5:50 PM   Specimen: Urine, Clean Catch  Result  Value Ref Range Status   Specimen Description   Final    URINE, CLEAN CATCH Performed at [facility], [address] [place], [place] [address]    Special Requests   Final    NONE Performed at [facility], [address] [place], [place] [address]    Culture   Final    NO GROWTH Performed at [facility], [place] [address], [place],  [address]    Report Status 09/22/2021 FINAL  Final    Labs: CBC: Recent Labs  Lab 09/18/21 0528  09/22/21 0425  WBC 7.1 9.5  NEUTROABS  --  7.6  HGB 10.9* 11.7*  HCT 33.3* 35.4*  MCV 96.5 97.8  PLT 188 092   Basic Metabolic Panel: Recent Labs  Lab 09/18/21 0528 09/22/21 0425 09/23/21 0443  NA 138 140 139  K 4.4 3.9 4.4  CL 103 111 112*  CO2 '27 22 24  '$ GLUCOSE 141* 114* 194*  BUN 34* 17 16  CREATININE 1.29* 1.07 1.22  CALCIUM 8.9 8.3* 8.4*  MG  --  1.9  --    Liver Function Tests: No results for input(s): AST, ALT, ALKPHOS, BILITOT, PROT, ALBUMIN in the last 168 hours. CBG: Recent Labs  Lab 09/22/21 1127 09/22/21 1703 09/22/21 2210 09/23/21 0734 09/23/21 1146  GLUCAP 151* 119* 233* 167* 133*    Discharge time spent: greater than 30 minutes.  Signed: Orson Eva, MD [facility] 09/23/2021

## 2021-09-23 NOTE — Care Management Important Message (Signed)
Important Message  Patient Details  Name: Kevin [name] MRN: [id number] Date of Birth: [birth year]   Medicare Important Message Given:  Yes     Tommy Medal 09/23/2021, 12:28 PM

## 2021-09-23 NOTE — Progress Notes (Signed)
Pt has discharge orders, pt will go to [place], report called and given to nurse Marissa, no further questions at this time.

## 2021-09-23 NOTE — TOC Transition Note (Signed)
Transition of Care [facility]) - CM/SW Discharge Note   Patient Details  Name: Kevin [name] MRN: [id number] Date of Birth: May 09, 1945  Transition of Care [facility]) CM/SW Contact:  Shade Flood, LCSW Phone Number: 09/23/2021, 12:46 PM   Clinical Narrative:     Pt stable for dc today per MD. Plan remains for transfer to [facility] for rehab. Spoke with pt who is agreeable to wheelchair [employer].   Updated Debbie at [facility]. They can take pt today. DC clinical sent electronically. RN to call report. W/C Lucianne Lei transport will be arranged.  No other TOC needs for dc.  Final next level of care: Skilled Nursing Facility Barriers to Discharge: Barriers Resolved   Patient Goals and CMS Choice Patient states their goals for this hospitalization and ongoing recovery are:: get better CMS Medicare.gov Compare Post Acute Care list provided to:: Patient Choice offered to / list presented to : Patient  Discharge Placement              Patient chooses bed at: Other - please specify in the comment section below: [facility]) Patient to be transferred to facility by: [place] Name of family member notified: pt only Patient and family notified of of transfer: 09/23/21  Discharge Plan and Services In-house Referral: Clinical Social Work   Post Acute Care Choice: [place]                               Social Determinants of Health (SDOH) Interventions     Readmission Risk Interventions    09/17/2021   11:27 AM  Readmission Risk Prevention Plan  Transportation Screening Complete  Home Care Screening Complete  Medication Review (RN CM) Complete

## 2021-09-23 NOTE — Discharge Instructions (Signed)
Discharge Open Abdominal Surgery Instructions:  Common Complaints: Pain at the incision site is common. This will improve with time. Take your pain medications as described below. Some nausea is common and poor appetite. The main goal is to stay hydrated the first few days after surgery.   Diet/ Activity: Diet as tolerated. You have started and tolerated a diet in the hospital, and should continue to increase what you are able to eat.   You may not have a large appetite, but it is important to stay hydrated. Drink 64 ounces of water a day. Your appetite will return with time.  Keep a dry dressing in place over your staples daily or as needed. Some minor pink/ blood tinged drainage is expected. This will stop in a few days after surgery.  Shower per your regular routine daily.  Do not take hot showers. Take warm showers that are less than 10 minutes. Path the incision dry. Wear an abdominal binder daily with activity. You do not have to wear this while sleeping or sitting.  Rest and listen to your body, but do not remain in bed all day.  Walk everyday for at least 15-20 minutes. Deep cough and move around every 1-2 hours in the first few days after surgery.  Do not lift > 10 lbs, perform excessive bending, pushing, pulling, squatting for 6-8 weeks after surgery.  The activity restrictions and the abdominal binder are to prevent hernia formation at your incision while you are healing.  Do not place lotions or balms on your incision unless instructed to specifically by Dr. Stuart Guillen.   Pain Expectations and Narcotics: -After surgery you will have pain associated with your incisions and this is normal. The pain is muscular and nerve pain, and will get better with time. -You are encouraged and expected to take non narcotic medications like tylenol and ibuprofen (when able) to treat pain as multiple modalities can aid with pain treatment. -Narcotics are only used when pain is severe or there is  breakthrough pain. -You are not expected to have a pain score of 0 after surgery, as we cannot prevent pain. A pain score of 3-4 that allows you to be functional, move, walk, and tolerate some activity is the goal. The pain will continue to improve over the days after surgery and is dependent on your surgery. -Due to [place] law, we are only able to give a certain amount of pain medication to treat post operative pain, and we only give additional narcotics on a patient by patient basis.  -For most laparoscopic surgery, studies have shown that the majority of patients only need 10-15 narcotic pills, and for open surgeries most patients only need 15-20.   -Having appropriate expectations of pain and knowledge of pain management with non narcotics is important as we do not want anyone to become addicted to narcotic pain medication.  -Using ice packs in the first 48 hours and heating pads after 48 hours, wearing an abdominal binder (when recommended), and using over the counter medications are all ways to help with pain management.   -Simple acts like meditation and mindfulness practices after surgery can also help with pain control and research has proven the benefit of these practices.  Medication: Take tylenol and ibuprofen as needed for pain control, alternating every 4-6 hours.  Example:  Tylenol 1000mg @ 6am, 12noon, 6pm, 12midnight (Do not exceed 4000mg of tylenol a day). Ibuprofen 800mg @ 9am, 3pm, 9pm, 3am (Do not exceed 3600mg of ibuprofen a day).    Take tramadol for breakthrough pain every 4 hours.  Take Colace for constipation related to narcotic pain medication. If you do not have a bowel movement in 2 days, take Miralax over the counter.  Drink plenty of water to also prevent constipation.   Contact Information: If you have questions or concerns, please call our office, [phone], Monday- Thursday 8AM-5PM and Friday 8AM-12Noon.  If it is after hours or on the weekend, please call [facility] Main  Number, [phone], [phone], and ask to speak to the surgeon on call for Dr. Constance Haw at [facility].

## 2021-09-23 NOTE — Progress Notes (Signed)
Pt states he has two sets of keys and a wallet in possession with security, called security and they will be bringing pts belongings up shortly.

## 2021-09-23 NOTE — Progress Notes (Signed)
[facility]  Doing well. Eating. No complaints.   Can shower.  BP (!) 161/81 (BP Location: Right Arm)   Pulse 65   Temp 97.7 F (36.5 C) (Oral)   Resp 18   Ht '5\' 9"'$  (1.753 m)   Wt 113.4 kg   SpO2 99%   BMI 36.92 kg/m  Incisions c/d/I with no erythema or drainage, some bruising  Patient s/p Ex lap, reduction of volvulus, removal of gastric band. Doing well. Diabetic diet Bowel regimen Binder Will see in clinic 6/1 to get staples out.  No heavy lifting > 10 lbs, excessive bending, pushing, pulling, or squatting for 6-8 weeks after surgery.    Curlene Labrum, MD [facility] [address] [place], [place] [address] [phone] (office)

## 2021-09-24 DIAGNOSIS — K56609 Unspecified intestinal obstruction, unspecified as to partial versus complete obstruction: Secondary | ICD-10-CM | POA: Diagnosis not present

## 2021-09-24 DIAGNOSIS — M10379 Gout due to renal impairment, unspecified ankle and foot: Secondary | ICD-10-CM | POA: Diagnosis not present

## 2021-09-24 DIAGNOSIS — Z6841 Body Mass Index (BMI) 40.0 and over, adult: Secondary | ICD-10-CM | POA: Diagnosis not present

## 2021-09-24 DIAGNOSIS — M1991 Primary osteoarthritis, unspecified site: Secondary | ICD-10-CM | POA: Diagnosis not present

## 2021-09-24 DIAGNOSIS — N183 Chronic kidney disease, stage 3 unspecified: Secondary | ICD-10-CM | POA: Diagnosis not present

## 2021-09-24 DIAGNOSIS — E1129 Type 2 diabetes mellitus with other diabetic kidney complication: Secondary | ICD-10-CM | POA: Diagnosis not present

## 2021-09-24 DIAGNOSIS — I249 Acute ischemic heart disease, unspecified: Secondary | ICD-10-CM | POA: Diagnosis not present

## 2021-10-03 ENCOUNTER — Ambulatory Visit (INDEPENDENT_AMBULATORY_CARE_PROVIDER_SITE_OTHER): Payer: Medicare PPO | Admitting: General Surgery

## 2021-10-03 ENCOUNTER — Encounter: Payer: Self-pay | Admitting: General Surgery

## 2021-10-03 VITALS — BP 163/104 | HR 70 | Temp 98.0°F | Resp 16 | Ht 69.5 in | Wt 260.0 lb

## 2021-10-03 DIAGNOSIS — R609 Edema, unspecified: Secondary | ICD-10-CM

## 2021-10-03 DIAGNOSIS — K562 Volvulus: Secondary | ICD-10-CM

## 2021-10-03 NOTE — Progress Notes (Unsigned)
[facility]  Future Appointments  Date Time Provider [place]  11/07/2021  1:15 PM Virl Cagey, MD [facility] None

## 2021-10-03 NOTE — Patient Instructions (Addendum)
No heavy lifting > 10 lbs, excessive bending, pushing, pulling, or squatting for 6-8 weeks after surgery.  Diet as tolerated.  Take fiber like benefiber or metamucil if needed for bowel movements.   Notified Dr. Delanna Ahmadi office of the BP and the edema in the legs.  Will see when they want to see you regarding the BP.

## 2021-10-07 ENCOUNTER — Telehealth: Payer: Self-pay | Admitting: *Deleted

## 2021-10-07 NOTE — Telephone Encounter (Signed)
Call placed to patient. LMTRC.  

## 2021-10-07 NOTE — Telephone Encounter (Signed)
Received call from patient [phone]~ telephone.  Surgical Date: 09/20/2021  Procedure: Exploratory laparotomy, reduction of volvulus, lysis of adhesions, removal of gastric band   Patient reports that skin surrounding incision is red and tender. Requested ABTx.   States that he has multiple allergies, but he can tolerate Cipro.   Pharmacy: [facility]  Please advise.

## 2021-10-08 ENCOUNTER — Telehealth: Payer: Self-pay | Admitting: Cardiovascular Disease

## 2021-10-08 MED ORDER — CIPROFLOXACIN HCL 500 MG PO TABS: 500.0000 mg | ORAL_TABLET | Freq: Two times a day (BID) | ORAL | 0 refills | Status: AC

## 2021-10-08 NOTE — Telephone Encounter (Signed)
Pt c/o swelling: STAT is pt has developed SOB within 24 hours  If swelling, where is the swelling located? Both legs   How much weight have you gained and in what time span? Yes, 20lbs two weeks.   Have you gained 3 pounds in a day or 5 pounds in a week? Doesn't know   Do you have a log of your daily weights (if so, list)? no  Are you currently taking a fluid pill? Yes   Are you currently SOB? No   Have you traveled recently? No   Pt c/o medication issue:  1. Name of Medication: clopidogrel (PLAVIX) 75 MG tablet  2. How are you currently taking this medication (dosage and times per day)? TAKE ONE TABLET BY MOUTH ONCE DAILY  3. Are you having a reaction (difficulty breathing--STAT)?   4. What is your medication issue? Broke out in a rash.  He hit his finger on something and it has a big purple knot on it.  He took himself off of the blood thinner.

## 2021-10-08 NOTE — Telephone Encounter (Signed)
Spoke with patient of Dr. [name] - 2 concerns - swelling, weight gain; possible med reaction   He was admitted for SBO - in hospital 2 weeks - said he was "pumped full of fluids" Weight was 253lbs prior to surgery - was 273lbs at discharge  At his post-op appt, weight was 261lbs He does not know his weight today - he does not weigh daily He reports leg edema persists - he elevates legs and it will not go down. He does report having venous insufficiency.  He denies shortness of breath. He takes furosemide '40mg'$  daily Renal function WNL Advised he take lasix '40mg'$  BID for 2 days and call on Thursday with update  He expressed concerns about plavix - possible side effects. He reports itching, splotches on areas over his body. He is concerned about a big purple knot on his finger. He had a cath in 2016, reports itching since he started plavix. He is asking for a different medication. On recent med change, was that while in hospital, he developed gout -- was treated in hospital with steroid and then post-discharge PCP prescribed colchicine -- finished this course 1.5 weeks ago. He was given Rx for tramadol but has not taken.   Will send to MD to review his concerns and follow up

## 2021-10-08 NOTE — Telephone Encounter (Signed)
Call placed to patient and patient made aware.   Prescription sent to pharmacy.   Appointment scheduled.  

## 2021-10-09 ENCOUNTER — Telehealth: Payer: Self-pay | Admitting: *Deleted

## 2021-10-09 ENCOUNTER — Encounter: Payer: Medicare PPO | Admitting: General Surgery

## 2021-10-09 NOTE — Telephone Encounter (Signed)
Spoke with patient and advised to STOP clopidogrel per MD  He has taken extra lasix as advised with no noticeable change in LE edema. He has not worn compression stockings since he came home from hospital. Encourage him to do this.   He saw vascular surgeon in the past (looks like 2011 - Dr. Oneida Alar) -- would it be advisable to repeat LE venous reflux studies to reassess/refer to VVS again? Will send to MD.

## 2021-10-09 NOTE — Telephone Encounter (Signed)
Received call from patient.   Reports that he is having issues with transportation and will not be able to make appointment on 10/09/2021 for wound check.   Patient reports that redness has greatly decreased. States that area is not tender or warm to touch. Continues to take Cipro as directed.  Appointment scheduled for Tuesday for wound check.   Dr. Constance Haw made aware.

## 2021-10-09 NOTE — Telephone Encounter (Signed)
Lorretta Harp, MD  You Just now (4:41 PM)   Probably should see an APP for further evaluation of his edema.  This has been a chronic problem in the past.    Spoke with patient. Scheduled with Denyse Amass NP 6/12 @ 1:55pm

## 2021-10-09 NOTE — Telephone Encounter (Signed)
Lorretta Harp, MD  Fidel Levy, RN Caller: Unspecified (Yesterday,  8:59 AM) Faythe Ghee to discontinue clopidogrel

## 2021-10-13 NOTE — Progress Notes (Deleted)
Cardiology Clinic Note   Patient Name: PIERCE BAROCIO Date of Encounter: 10/13/2021  Primary Care Provider:  Sharilyn Sites, MD Primary Cardiologist:  Quay Burow, MD  Patient Profile    [name]. Rotundo 76 year old male presents the clinic today for an evaluation of his accelerated/increased heart rate.  Past Medical History    Past Medical History:  Diagnosis Date   Angio-edema    Asthma    as a child. no problems now.    CAD (coronary artery disease)    Cellulitis 5/11   left leg   Diabetes mellitus    Diverticulosis    Diverticulosis    HTN (hypertension)    Hyperlipidemia    Melanoma ([place]) 10/11   left foot   Melanoma of foot ([place]) 06/02/2011   Microscopic colitis 10/09/10   Colonoscopy dr [name]   Obesity    OSA on CPAP 01/20/2014   Psoriasis    S/P colonoscopy 07/03/03    Dr [name]->hyperplastic rectal polyp   S/P endoscopy 10/09/10   lap band intact, duodenal erosions   Seasonal allergies    Sleep apnea    Urticaria    Past Surgical History:  Procedure Laterality Date   ABDOMINAL SURGERY     ADENOIDECTOMY     APPENDECTOMY     CARDIAC CATHETERIZATION N/A 02/14/2015   Procedure: Left Heart Cath and Coronary Angiography;  Surgeon: Jettie Booze, MD;  Location: [facility];  Service: Cardiovascular;  Laterality: N/A;   CARDIAC CATHETERIZATION N/A 02/14/2015   Procedure: Coronary Stent Intervention;  Surgeon: Jettie Booze, MD;  Location: [place] [facility];  Service: Cardiovascular;  Laterality: N/A;   COLONOSCOPY N/A 10/09/2010   normal rectum, long redundant colon, pancolonic diverticula, 35m cecal polyp, diminutive polyp at the appendiceal orifice ablated with tip of hot snare (Biopsies suggestive of microscopic colitis; polyp benign; small bowel ok)   CORONARY ANGIOPLASTY  2002/2005/2008   CORONARY ANGIOPLASTY WITH STENT PLACEMENT  06/11/2007   PTCA & stenting distal CX, PTCA & balloon angioplasty in-stent restenotic mid CX coronary artery  stent   ESOPHAGOGASTRODUODENOSCOPY N/A 10/09/2010   Normal esophagus, lap band present, 2nd portion duodenum with erosions without frank ulcer   GASTRIC BANDING PORT REVISION N/A 09/20/2021   Procedure: GASTRIC BAND REMOVAL;  Surgeon: BVirl Cagey MD;  Location: [facility];  Service: General;  Laterality: N/A;   KNEE SURGERY     left   LAPAROTOMY N/A 09/20/2021   Procedure: EXPLORATORY LAPAROTOMY,  LYSIS OF ADHESIONS, REDUCTION OF VOLVULUS;  Surgeon: BVirl Cagey MD;  Location: [facility];  Service: General;  Laterality: N/A;   LEFT HEART CATHETERIZATION WITH CORONARY ANGIOGRAM N/A 07/15/2013   Procedure: LEFT HEART CATHETERIZATION WITH CORONARY ANGIOGRAM;  Surgeon: HSinclair Grooms MD;  [facility];  Service: Cardiovascular;  Laterality: N/A;   NM MYOCAR PERF WALL MOTION  06/24/2011   small mild fixed anteroseptal & anteroapical defects.   PENILE PROSTHESIS IMPLANT     SHOULDER SURGERY     left   SINOSCOPY     TONSILLECTOMY     TYMPANOSTOMY TUBE PLACEMENT      Allergies  Allergies  Allergen Reactions   Altace [Ramipril] Shortness Of Breath   Avapro [Irbesartan] Shortness Of Breath   Prednisone Anaphylaxis, Swelling and Other (See Comments)    SWELLING OF THE LIPS   Shellfish Allergy Anaphylaxis    Shrimp, selfish   Tradjenta [Linagliptin] Shortness Of Breath   Cephalexin Hives and Swelling  Clindamycin Hcl Hives and Swelling   Codeine Nausea And Vomiting and Other (See Comments)    AFFECTS HEART   Contrast Media [Iodinated Contrast Media]     Can be administered if Benadryl is administered prior to prevent reaction    Fenofibrate Other (See Comments)    "My skin started peeling off"   Glycotrol [Support-500] Other (See Comments)    unknown   Indomethacin Other (See Comments)    UNKNOWN REACTION   Minocycline Hcl Hives and Swelling   Morphine And Related Nausea And Vomiting    PROJECTILE VOMITING   Sulfa Drugs Cross Reactors Hives and Swelling    Tetracyclines & Related    Tree Extract Itching    History of Present Illness    Mr. Liew PMH includes coronary artery disease status post remote LAD and circumflex stenting 2001 and in 2004 by Dr. [name] and [name].  He discontinued his smoking and drinking at that time.  His PMH also includes essential hypertension, HLD, type 2 diabetes, and OSA on CPAP.  He underwent lap banding however, he ultimately regained his weight.  He underwent an ablation procedure by Dr. Elisabeth Cara of his left great saphenous vein.  He did not have significant improvement with the procedure.  He had a functional study 8/10 which was nonischemic.  He had follow-up February 2013 due to chest pain which showed low risk.  He was admitted to [facility] with shortness of breath.  He underwent cardiac catheterization by Dr. [name] which showed a patent LAD and circumflex stents, left dominant system and occluded nondominant right.  Medical therapy was recommended.  He reported he experimented with stopping his newly added diabetes medication.  He reported he felt clinically improved after discontinuation.  He was admitted 10/16 with unstable angina and underwent cardiac catheterization by Dr. [name].  It showed in-stent restenosis of his LAD with a 95% distal edge stenosis.  It was treated with DES.  He was also noted to have a ostial diagonal branch stenosis which was jailed and a 75% first obtuse marginal branch stenosis as well as an occluded nondominant RCA.  He was seen in follow-up by Dr. [name] on 06/06/2020.  During that time he reported 1 episode of jaw pain but no chest pain.  He did complain of pain in both shoulders with radiation to his elbows and hands.  He reported he was scheduled for an MRI on Friday.  He was felt that his symptoms were consistent with cervical radiculopathy.  He was referred to the weight loss clinic.  He was seeking alternative therapies for his OSA and reported intolerance of  his CPAP.  He contacted nurse triage line on 06/15/2020.  He indicated that he was noticing increased heart rates as high as 150.  He reported that he had noticed the increase after starting to take his Synthroid.  However, he reported he had been on Synthroid for quite some time.  He requested that Dr. [name] reach out to Dr. [name] to discuss lowering his Synthroid medication.  He presented to the clinic 06/26/20 for follow-up evaluation stated he noticed a few weeks ago he had increased heart rates that he felt was related to his initiation of Synthroid medication.  We reviewed his chart and his EKGs.  In September 2021 he was also noted to have a accelerated rhythm.  He requested evaluation for shoulder surgery as well.  Due to his paroxysmal nature of his heart rates we ordered a 14-day  ZIO monitor,  CBC, and BMP.  Told him that we will need to defer his surgery until after his cardiac event monitor results were reviewed.  I planned follow-up in 8 weeks and asked him to avoid triggers for accelerated heart rate.  EKG showed normal sinus rhythm 84 bpm no ST or T wave deviation.  He was seen in follow-up by Dr. [name] on 03/08/2021.  His cardiac event monitor was reviewed at that time.  It showed occasional PACs, PVCs, and short runs of SVT.  He reported that his palpitations had resolved since lowering his dose of Synthroid.  He was considered low risk for his upcoming shoulder surgeries.  He presents to the clinic today for follow-up evaluation states***  Today he denies chest pain, shortness of breath, lower extremity edema, fatigue, palpitations, melena, hematuria, hemoptysis, diaphoresis, weakness, presyncope, syncope, orthopnea, and PND.   Home Medications    Prior to Admission medications   Medication Sig Start Date End Date Taking? Authorizing Provider  Ascorbic Acid (VITAMIN C) 1000 MG tablet Take 1,000 mg by mouth daily.    [provider]  aspirin 81 MG tablet Take 81 mg by mouth  daily.    [provider]  cetirizine (ZYRTEC) 10 MG tablet Take 10 mg by mouth daily.    [provider]  clopidogrel (PLAVIX) 75 MG tablet TAKE ONE TABLET BY MOUTH ONCE DAILY. 06/11/20   Lorretta Harp, MD  Coenzyme Q10 (CO Q 10) 10 MG CAPS Take 1 capsule by mouth daily.    [provider]  diphenhydrAMINE (BENADRYL) 25 MG tablet Take 25 mg by mouth every 6 (six) hours as needed.    [provider]  EPINEPHrine (EPIPEN 2-PAK) 0.3 mg/0.3 mL IJ SOAJ injection Inject 0.3 mLs (0.3 mg total) into the muscle as needed for anaphylaxis. 01/12/19   Valentina Shaggy, MD  fluticasone [facility]) 50 MCG/ACT nasal spray Place 2 sprays into both nostrils daily.    [provider]  furosemide (LASIX) 40 MG tablet Take 40 mg by mouth daily.    [provider]  HYDROcodone-acetaminophen (NORCO/VICODIN) 5-325 MG tablet Take 1-2 tablets by mouth every 6 (six) hours as needed. 03/05/20   Volanda Napoleon, PA-C  levothyroxine (SYNTHROID) 100 MCG tablet Take 100 mcg by mouth daily before breakfast.    [provider]  metoprolol (TOPROL-XL) 100 MG 24 hr tablet Take 100 mg by mouth daily.    [provider]  niacin (NIASPAN) 1000 MG CR tablet Take 1 tablet (1,000 mg total) by mouth at bedtime. Pt needs to keep upcoming appt in Feb for further refills 05/22/20   Lorretta Harp, MD  nitrofurantoin, macrocrystal-monohydrate, (MACROBID) 100 MG capsule Take 1 capsule (100 mg total) by mouth 2 (two) times daily. 01/16/20   Idol, Almyra Free, PA-C  ondansetron (ZOFRAN ODT) 4 MG disintegrating tablet Take 1 tablet (4 mg total) by mouth every 8 (eight) hours as needed for nausea or vomiting. 03/05/20   Volanda Napoleon, PA-C  pravastatin (PRAVACHOL) 40 MG tablet Take 1 tablet (40 mg total) by mouth every evening. 04/18/19   Lorretta Harp, MD  RESVERATROL PO Take 1 capsule by mouth every morning.     [provider]  sitaGLIPtin (JANUVIA) 100 MG  tablet Take 100 mg by mouth daily.    [provider]  valsartan-hydrochlorothiazide (DIOVAN-HCT) 160-12.5 MG per tablet Take 1 tablet by mouth daily.    [provider]    Family History  Family History  Problem Relation Age of Onset   Coronary artery disease Mother    Diabetes Father    Liver cancer Brother 67   Celiac disease Other 69       brother's daughter   Allergic rhinitis Neg Hx    Angioedema Neg Hx    Asthma Neg Hx    Atopy Neg Hx    Eczema Neg Hx    Immunodeficiency Neg Hx    Urticaria Neg Hx    He indicated that his mother is deceased. He indicated that his father is deceased. He indicated that one of his two brothers is deceased. He indicated that his maternal grandmother is deceased. He indicated that his maternal grandfather is deceased. He indicated that his paternal grandmother is deceased. He indicated that his paternal grandfather is deceased. He indicated that the status of his neg hx is unknown. He indicated that the status of his other is unknown.  Social History    Social History   Socioeconomic History   Marital status: Divorced    Spouse name: Not on file   Number of children: 1   Years of education: Not on file   Highest education level: Not on file  Occupational History   Occupation: Leisure centre manager: [employer]    Comment: [place]  Tobacco Use   Smoking status: Former    Packs/day: 3.00    Years: 30.00    Total pack years: 90.00    Types: Cigarettes    Start date: 11/18/1964    Quit date: 10/04/1994    Years since quitting: 27.0   Smokeless tobacco: Never   Tobacco comments:    quit smoking 19 years ago  Vaping Use   Vaping Use: Never used  Substance and Sexual Activity   Alcohol use: Yes    Alcohol/week: 0.0 standard drinks of alcohol    Comment: 3-4 times/year   Drug use: No   Sexual activity: Not Currently  Other Topics Concern   Not on file  Social History Narrative   Not on file   Social  Determinants of Health   Financial Resource Strain: Not on file  Food Insecurity: Not on file  Transportation Needs: Not on file  Physical Activity: Not on file  Stress: Not on file  Social Connections: Not on file  Intimate Partner Violence: Not on file     Review of Systems    General:  No chills, fever, night sweats or weight changes.  Cardiovascular:  No chest pain, dyspnea on exertion, edema, orthopnea, palpitations, paroxysmal nocturnal dyspnea. Dermatological: No rash, lesions/masses Respiratory: No cough, dyspnea Urologic: No hematuria, dysuria Abdominal:   No nausea, vomiting, diarrhea, bright red blood per rectum, melena, or hematemesis Neurologic:  No visual changes, wkns, changes in mental status. All other systems reviewed and are otherwise negative except as noted above.  Physical Exam    VS:  There were no vitals taken for this visit. , BMI There is no height or weight on file to calculate BMI. GEN: Well nourished, well developed, in no acute distress. HEENT: normal. Neck: Supple, no JVD, carotid bruits, or masses. Cardiac: RRR, no murmurs, rubs, or gallops. No clubbing, cyanosis, edema.  Radials/DP/PT 2+ and equal bilaterally.  Respiratory:  Respirations regular and unlabored, clear to auscultation bilaterally. GI: Soft, nontender, nondistended, BS + x 4. MS: no deformity or atrophy. Skin: warm and dry, no rash. Neuro:  Strength and sensation are intact. Psych: Normal affect.  Accessory Clinical Findings    Recent Labs: 09/11/2021: ALT 17 09/22/2021: Hemoglobin 11.7; Magnesium 1.9; Platelets 243 09/23/2021: BUN 16; Creatinine, Ser 1.22; Potassium 4.4; Sodium 139   Recent Lipid Panel    Component Value Date/Time   CHOL 148 10/12/2017 0826   TRIG 145 10/12/2017 0826   HDL 45 10/12/2017 0826   CHOLHDL 3.3 10/12/2017 0826   CHOLHDL 3.1 12/06/2015 0844   VLDL 22 12/06/2015 0844   LDLCALC 74 10/12/2017 0826    ECG personally reviewed by me  today-***  EKG 06/06/2020 normal sinus rhythm no ST or T wave deviation 84 bpm- No acute changes   Cardiac catheterization 03/17/2015  Non-dominant Prox RCA lesion, 100% stenosed. Unchanged from prior. Ost 2nd Diag lesion, jailed by the prior stent, 80% stenosed. It increased somewhat in severity but TIMI-3 flow was maintained. It was easily wired even after the stent was placed. Mid LAD-1 lesion, 99% stenosed, focal in-stent restenosis. There is a 95% de novo lesion just past the distal edge of the stent. The entire area was treated with a 3.5 x 28 Synergy drug-eluting stent, postdilated to 3.8 mm. Post intervention, there is a 0% residual stenosis.   Continue dual antiplatelet therapy for at least a year without interruption. The patient did rule in for a non-STEMI. He had already been taking clopidogrel so I did not change this. It would not be unreasonable to look at a more potent antiplatelet inhibitor such as Brilinta if he can handle this from a bleeding risk standpoint.   Continue aggressive secondary prevention. Diagnostic Dominance: Left    Intervention     Cardiac event monitor-2022 Occasional PACs, PVCs and short runs of SVT.  Assessment & Plan   1.  Accelerated heart rate-heart rate today ***.  Underwent diagnostics with cardiac event monitor which showed PACs, PVCs, and short runs of SVT.  He had  been started on Synthroid at 100 mcg.  His dose was lowered to 50 mcg and his palpitations resolved. Continue to monitor Heart healthy low-sodium diet Increase physical activity as tolerated Avoid triggers caffeine, chocolate, EtOH, dehydration etc.  Coronary artery disease-no recent episodes of chest discomfort.  Cardiac catheterization 2001, 2004, and most recently 2016.  Details above. Continue aspirin, Plavix, co-Q10, metoprolol, pravastatin, valsartan, HCTZ Heart healthy low-sodium diet-salty 6 given Increase physical activity as tolerated  Essential hypertension-BP  today ***120/70.  Well-controlled at home. Continue metoprolol, valsartan, HCTZ Heart healthy low-sodium diet-salty 6 given Increase physical activity as tolerated  Hyperlipidemia-LDL 82 on 03/15/2020. Continue pravastatin Heart healthy low-sodium high-fiber diet Increase physical activity as tolerated  Hypothyroidism-reports compliance with levothyroxine Continue current medical therapy Follows with PCP  Disposition: Follow-up with Dr. [name] or me in 4-6 months.   Jossie Ng. Cam Dauphin NP-C    10/13/2021, 11:56 AM [place] [address] Office [phone] Fax [phone]  Notice: This dictation was prepared with Dragon dictation along with smaller phrase technology. Any transcriptional errors that result from this process are unintentional and may not be corrected upon review.  I spent 12 minutes examining this patient, reviewing medications, and using patient centered shared decision making involving her cardiac care.  Prior to her visit I spent greater than 20 minutes reviewing her past medical history,  medications, and prior cardiac tests.

## 2021-10-14 ENCOUNTER — Other Ambulatory Visit: Payer: Self-pay | Admitting: Cardiovascular Disease

## 2021-10-14 ENCOUNTER — Ambulatory Visit: Payer: Medicare PPO | Admitting: General Practice

## 2021-10-15 ENCOUNTER — Ambulatory Visit (INDEPENDENT_AMBULATORY_CARE_PROVIDER_SITE_OTHER): Payer: Medicare PPO | Admitting: General Surgery

## 2021-10-15 ENCOUNTER — Encounter: Payer: Self-pay | Admitting: General Surgery

## 2021-10-15 VITALS — BP 143/79 | HR 80 | Temp 98.1°F | Resp 16 | Ht 69.5 in | Wt 262.0 lb

## 2021-10-15 DIAGNOSIS — K562 Volvulus: Secondary | ICD-10-CM

## 2021-10-15 NOTE — Progress Notes (Signed)
[facility]  Doing well. Incision healing.   BP (!) 143/79   Pulse 80   Temp 98.1 F (36.7 C) (Oral)   Resp 16   Ht 5' 9.5" (1.765 m)   Wt 262 lb (118.8 kg)   SpO2 98%   BMI 38.14 kg/m  Midline with some irritation in the umbilical region and scabbing, no extending erythema   Patient s/p Ex lap, reduction volvulus, and removal gastric band. Doing well.   No heavy lifting > 10 lbs, excessive bending, pushing, pulling, or squatting for 6-8 weeks after surgery.   Call with questions or concerns.  PRN follow up   Future Appointments  Date Time Provider [place]  10/17/2021  9:15 AM Warren Lacy, PA-C [facility] [place], MD [facility] [address] [place], [place] [address] [phone] (office)

## 2021-10-15 NOTE — Patient Instructions (Signed)
No heavy lifting > 10 lbs, excessive bending, pushing, pulling, or squatting for 6-8 weeks after surgery.   Call with questions or concerns.  

## 2021-10-17 ENCOUNTER — Encounter: Payer: Self-pay | Admitting: Physician Assistant

## 2021-10-17 ENCOUNTER — Ambulatory Visit: Payer: Medicare PPO | Admitting: Physician Assistant

## 2021-10-17 VITALS — BP 173/77 | HR 73 | Ht 69.5 in | Wt 261.8 lb

## 2021-10-17 DIAGNOSIS — E782 Mixed hyperlipidemia: Secondary | ICD-10-CM

## 2021-10-17 DIAGNOSIS — I2510 Atherosclerotic heart disease of native coronary artery without angina pectoris: Secondary | ICD-10-CM

## 2021-10-17 DIAGNOSIS — R600 Localized edema: Secondary | ICD-10-CM | POA: Diagnosis not present

## 2021-10-17 DIAGNOSIS — I10 Essential (primary) hypertension: Secondary | ICD-10-CM | POA: Diagnosis not present

## 2021-10-17 LAB — BASIC METABOLIC PANEL
BUN/Creatinine Ratio: 16 (ref 10–24)
BUN: 17 mg/dL (ref 8–27)
CO2: 26 mmol/L (ref 20–29)
Calcium: 10 mg/dL (ref 8.6–10.2)
Chloride: 101 mmol/L (ref 96–106)
Creatinine, Ser: 1.09 mg/dL (ref 0.76–1.27)
Glucose: 128 mg/dL — ABNORMAL HIGH (ref 70–99)
Potassium: 4.1 mmol/L (ref 3.5–5.2)
Sodium: 140 mmol/L (ref 134–144)
eGFR: 71 mL/min/{1.73_m2} (ref >59)

## 2021-10-17 MED ORDER — VALSARTAN-HYDROCHLOROTHIAZIDE 160-12.5 MG PO TABS: 1.0000 | ORAL_TABLET | Freq: Every day | ORAL | 3 refills | Status: DC

## 2021-10-17 NOTE — Patient Instructions (Addendum)
Medication Instructions:  Increase Lasix to 80 mg ( Take 2 40 mg Tablet for 5 Day) *If you need a refill on your cardiac medications before your next appointment, please call your pharmacy*   Lab Work: BMET Today If you have labs (blood work) drawn today and your tests are completely normal, you will receive your results only by: [place] (if you have MyChart) OR A paper copy in the mail If you have any lab test that is abnormal or we need to change your treatment, we will call you to review the results.   Testing/Procedures: No Testing   Follow-Up: At [facility], you and your health needs are our priority.  As part of our continuing mission to provide you with exceptional heart care, we have created designated Provider Care Teams.  These Care Teams include your primary Cardiologist (physician) and Advanced Practice Providers (APPs -  Physician Assistants and Nurse Practitioners) who all work together to provide you with the care you need, when you need it.  We recommend signing up for the patient portal called "MyChart".  Sign up information is provided on this After Visit Summary.  MyChart is used to connect with patients for Virtual Visits (Telemedicine).  Patients are able to view lab/test results, encounter notes, upcoming appointments, etc.  Non-urgent messages can be sent to your provider as well.   To learn more about what you can do with MyChart, go to [email or web address].    Your next appointment:   2 week(s)  The format for your next appointment:   In Person  Provider:   Caron Presume, PA-C    Then, Quay Burow, MD will plan to see you again in 5 month(s).    Other Instructions If Dry weight is 253 lbs. Before 5 day return to lasix 40 mg Daily.  Important Information About Sugar

## 2021-10-17 NOTE — Progress Notes (Signed)
Cardiology Office Note:    Date:  10/17/2021   ID:  Kevin [name], DOB 12/02/1945, MRN [id number]  PCP:  Sharilyn Sites, [place] Cardiologist: Quay Burow, MD   Reason for visit: LE edema  History of Present Illness:    Kevin [name] is a 76 y.o. male with a hx of CAD, status post remote LAD and circumflex stenting in 2001 and 2004 respectively, remote tobacco use, hypertension, hyperlipidemia, diabetes, sleep apnea, obesity status post lap band in 2011.  He had chest pain in 2016 with LHC showing LAD stent had diffuse in-stent restenosis with a 95% distal edge stenosis which was restented using a 3.5 x 28 mm long synergy drug-eluting stent. He did have an ostial diagonal branch stenosis which was "jailedand a 75% first obtuse marginal branch stenosis as well as an occluded nondominant RCA.  He has a history of palpitations following Synthroid 100 mcg.  An event monitor showed occasional PACs, PVCs and short runs of SVT.  His dose was lowered to 50 mcg and his palpitations improved.  He last saw Dr. [name] in November 2022 and was doing well.  Patient called June 6 with concerns of swelling, weight gain and possible med reaction.  Admitted to the hospital for small bowel obstruction (history of lap band in 2011, volvulus of jejunum S/p reduction 09/20/20).  He underwent exploratory laparotomy, reduction of volvulus, lysis of adhesions and removal of gastric band.  With acute on chronic kidney disease, he was given IV fluids.  His baseline creatinine is 1.1-1.4 and it peaked at 3.4.  Creatinine 1.2 on discharge.  His weight was 253 pounds prior to surgery and to 271 pounds at discharge.  He had resulting lower extremity edema despite Lasix 40 mg daily.  He was recommended to take Lasix 40 mg twice daily for 2 days.  He also called with concerns of Plavix reporting itching and splotchy areas.  Plavix discontinued.  Today he states is not sure the itching was 2/2 Plavix.  Today, he  states he has a history of lower extremity edema but it typically resolves with leg elevation.  Even with doubling his Lasix for 2 days, leg elevation and compression hose, he has not been able to reduce his leg swelling below the knees.  His weight at home now is 261 pounds, 8 pounds up from his dry weight of 253 pounds.  He does say he has a history of left lower extremity cellulitis with chronic skin discoloration and swelling worse on left.  He denies any current lower extremity blisters or weeping.  He states his HCTZ was discontinued secondary to a gout flare in the hospital.  He really thinks he needs to put back on this to help his swelling and BP control.  He sleeps in a chair chronically.  He denies PND, orthopnea, shortness of breath, chest pain, lightheadedness and palpitations.  He states his blood pressure is usually well controlled at home.    Past Medical History:  Diagnosis Date   Angio-edema    Asthma    as a child. no problems now.    CAD (coronary artery disease)    Cellulitis 5/11   left leg   Diabetes mellitus    Diverticulosis    Diverticulosis    HTN (hypertension)    Hyperlipidemia    Melanoma ([place]) 10/11   left foot   Melanoma of foot ([place]) 06/02/2011   Microscopic colitis 10/09/10   Colonoscopy dr [name]  Obesity    OSA on CPAP 01/20/2014   Psoriasis    S/P colonoscopy 07/03/03    Dr [name]->hyperplastic rectal polyp   S/P endoscopy 10/09/10   lap band intact, duodenal erosions   Seasonal allergies    Sleep apnea    Urticaria     Past Surgical History:  Procedure Laterality Date   ABDOMINAL SURGERY     ADENOIDECTOMY     APPENDECTOMY     CARDIAC CATHETERIZATION N/A 02/14/2015   Procedure: Left Heart Cath and Coronary Angiography;  Surgeon: Jettie Booze, MD;  Location: [facility];  Service: Cardiovascular;  Laterality: N/A;   CARDIAC CATHETERIZATION N/A 02/14/2015   Procedure: Coronary Stent Intervention;  Surgeon: Jettie Booze, MD;   Location: [facility];  Service: Cardiovascular;  Laterality: N/A;   COLONOSCOPY N/A 10/09/2010   normal rectum, long redundant colon, pancolonic diverticula, 82m cecal polyp, diminutive polyp at the appendiceal orifice ablated with tip of hot snare (Biopsies suggestive of microscopic colitis; polyp benign; small bowel ok)   CORONARY ANGIOPLASTY  2002/2005/2008   CORONARY ANGIOPLASTY WITH STENT PLACEMENT  06/11/2007   PTCA & stenting distal CX, PTCA & balloon angioplasty in-stent restenotic mid CX coronary artery stent   ESOPHAGOGASTRODUODENOSCOPY N/A 10/09/2010   Normal esophagus, lap band present, 2nd portion duodenum with erosions without frank ulcer   GASTRIC BANDING PORT REVISION N/A 09/20/2021   Procedure: GASTRIC BAND REMOVAL;  Surgeon: BVirl Cagey MD;  Location: [facility];  Service: General;  Laterality: N/A;   KNEE SURGERY     left   LAPAROTOMY N/A 09/20/2021   Procedure: EXPLORATORY LAPAROTOMY,  LYSIS OF ADHESIONS, REDUCTION OF VOLVULUS;  Surgeon: BVirl Cagey MD;  Location: [facility];  Service: General;  Laterality: N/A;   LEFT HEART CATHETERIZATION WITH CORONARY ANGIOGRAM N/A 07/15/2013   Procedure: LEFT HEART CATHETERIZATION WITH CORONARY ANGIOGRAM;  Surgeon: HSinclair Grooms MD;  [facility];  Service: Cardiovascular;  Laterality: N/A;   NM MYOCAR PERF WALL MOTION  06/24/2011   small mild fixed anteroseptal & anteroapical defects.   PENILE PROSTHESIS IMPLANT     SHOULDER SURGERY     left   SINOSCOPY     TONSILLECTOMY     TYMPANOSTOMY TUBE PLACEMENT      Current Medications: Current Meds  Medication Sig   Ascorbic Acid (VITAMIN C) 1000 MG tablet Take 1,000 mg by mouth daily.   aspirin 81 MG tablet Take 81 mg by mouth daily.   Coenzyme Q10 (CO Q 10) 10 MG CAPS Take 1 capsule by mouth daily.   diphenhydrAMINE (BENADRYL) 25 MG tablet Take 25 mg by mouth every 6 (six) hours as needed.   fluticasone (FLONASE) 50 MCG/ACT nasal spray Place 2 sprays  into both nostrils daily.   furosemide (LASIX) 40 MG tablet Take 40 mg by mouth daily.   glipiZIDE (GLUCOTROL) 5 MG tablet Take 5 mg by mouth daily as needed.   levothyroxine (SYNTHROID) 50 MCG tablet Take 50 mcg by mouth daily before breakfast.   metoprolol (TOPROL-XL) 100 MG 24 hr tablet Take 100 mg by mouth daily.   niacin (NIASPAN) 1000 MG CR tablet TAKE 1 TABLET BY MOUTH AT BEDTIME.   pravastatin (PRAVACHOL) 40 MG tablet Take 1 tablet (40 mg total) by mouth every evening.   RESVERATROL PO Take 1 capsule by mouth every morning.    sitaGLIPtin (JANUVIA) 100 MG tablet Take 100 mg by mouth daily.   tamsulosin (FLOMAX) 0.4 MG  CAPS capsule Take 0.4 mg by mouth daily.   traMADol (ULTRAM) 50 MG tablet Take 1 tablet (50 mg total) by mouth every 6 (six) hours as needed for moderate pain.   valsartan (DIOVAN) 160 MG tablet Take 160 mg by mouth daily.   [DISCONTINUED] valsartan-hydrochlorothiazide (DIOVAN-HCT) 160-12.5 MG per tablet Take 1 tablet by mouth daily.     Allergies:   Altace [ramipril], Avapro [irbesartan], Prednisone, Shellfish allergy, Tradjenta [linagliptin], Cephalexin, Clindamycin hcl, Codeine, Contrast media [iodinated contrast media], Fenofibrate, Glycotrol [support-500], Indomethacin, Minocycline hcl, Morphine and related, Sulfa drugs cross reactors, Tetracyclines & related, and Tree extract   Social History   Socioeconomic History   Marital status: Divorced    Spouse name: Not on file   Number of children: 1   Years of education: Not on file   Highest education level: Not on file  Occupational History   Occupation: Leisure centre manager: [employer]    Comment: [place]  Tobacco Use   Smoking status: Former    Packs/day: 3.00    Years: 30.00    Total pack years: 90.00    Types: Cigarettes    Start date: 11/18/1964    Quit date: 10/04/1994    Years since quitting: 27.0   Smokeless tobacco: Never   Tobacco comments:    quit smoking 19 years ago  Vaping Use    Vaping Use: Never used  Substance and Sexual Activity   Alcohol use: Yes    Alcohol/week: 0.0 standard drinks of alcohol    Comment: 3-4 times/year   Drug use: No   Sexual activity: Not Currently  Other Topics Concern   Not on file  Social History Narrative   Not on file   Social Determinants of Health   Financial Resource Strain: Not on file  Food Insecurity: Not on file  Transportation Needs: Not on file  Physical Activity: Not on file  Stress: Not on file  Social Connections: Not on file     Family History: The patient's family history includes Celiac disease (age of onset: 40) in an other family member; Coronary artery disease in his mother; Diabetes in his father; Liver cancer (age of onset: 65) in his brother. There is no history of Allergic rhinitis, Angioedema, Asthma, Atopy, Eczema, Immunodeficiency, or Urticaria.  ROS:   Please see the history of present illness.     EKGs/Labs/Other Studies Reviewed:    Recent Labs: 09/11/2021: ALT 17 09/22/2021: Hemoglobin 11.7; Magnesium 1.9; Platelets 243 09/23/2021: BUN 16; Creatinine, Ser 1.22; Potassium 4.4; Sodium 139   Recent Lipid Panel Lab Results  Component Value Date/Time   CHOL 148 10/12/2017 08:26 AM   TRIG 145 10/12/2017 08:26 AM   HDL 45 10/12/2017 08:26 AM   LDLCALC 74 10/12/2017 08:26 AM    Physical Exam:    VS:  BP (!) 173/77   Pulse 73   Ht 5' 9.5" (1.765 m)   Wt 261 lb 12.8 oz (118.8 kg)   SpO2 100%   BMI 38.11 kg/m    No data found.  Wt Readings from Last 3 Encounters:  10/17/21 261 lb 12.8 oz (118.8 kg)  10/15/21 262 lb (118.8 kg)  10/03/21 260 lb (117.9 kg)     GEN:  Well nourished, well developed in no acute distress, obese HEENT: Normal NECK: No JVD; No carotid bruits CARDIAC: RRR, no murmurs, rubs, gallops RESPIRATORY:  Clear to auscultation without rales, wheezing or rhonchi  ABDOMEN: Soft, non-tender, non-distended MUSCULOSKELETAL: 2-3+ LE  edema to knees bilaterally, pitting, ruddy  appearance in LLE SKIN: Warm and dry NEUROLOGIC:  Alert and oriented PSYCHIATRIC:  Normal affect     ASSESSMENT AND PLAN   Lower extremity edema -Chronic, presumably related to venous reflux disease  s/p endovenous ablation of the left by Dr. Elisabeth Cara in the past.   -Worsened post aggressive IVF hydration following SBO and A/CKD -Resume HCTZ 12.5 mg daily via Diovan 160 mg-HCTZ 12.5 mg once daily tablet.  Monitor for gout recurrence. -Increase Lasix to 80 mg for 5 days. -Check BMET today and again in 1 week.  He does mention a history of dehydration. -I will reevaluate in the office in 2 weeks.  If his LE swelling does not improved, we will consider changing him to torsemide. -Continue salt restriction, leg elevation and compression stockings.  Coronary artery disease with no angina -History of PCI of LAD and LCx in in 2001 & 2004 -Last [place] 2016: He had diffuse in-stent restenosis of his LAD stent with a 95% distal edge stenosis which was restented with a 3.5 mm x 28 mm long Synergy drug-eluting stent.  He had a diagonal branch ostium that was jailed and and a 75% first obtuse marginal branch stenosis as well as an occluded nondominant RCA.   -Plavix discontinued June 2023 with complaints of possible side effect of itching and bruising -Continue aspirin and statin therapy.  Sleep apnea -Did not tolerate CPAP.  He is seeing Dr. Valetta Mole and exploring the inspire device.  Hypertension, BP elevated today -Repeat blood pressure 154/80. -Add back HCTZ. -Asked him to bring a blood pressure log to his a follow-up appointment in 2 weeks. -Goal BP is <130/80.  Recommend DASH diet (high in vegetables, fruits, low-fat dairy products, whole grains, poultry, fish, and nuts and low in sweets, sugar-sweetened beverages, and red meats), salt restriction and increase physical activity.  Hyperlipidemia  -Continue pravastatin.  She is doing -Discussed cholesterol lowering diets - Mediterranean diet,  DASH diet, vegetarian diet, low-carbohydrate diet and avoidance of trans fats.  Discussed healthier choice substitutes.  Nuts, high-fiber foods, and fiber supplements may also improve lipids.    Disposition - Follow-up in 2 weeks for follow-up on lower extremity edema.  Follow-up with Dr. [name] in November for annual visit.   Medication Adjustments/Labs and Tests Ordered: Current medicines are reviewed at length with the patient today.  Concerns regarding medicines are outlined above.  Orders Placed This Encounter  Procedures   For home use only DME Other see comment   Basic metabolic panel   Basic metabolic panel   Meds ordered this encounter  Medications   valsartan-hydrochlorothiazide (DIOVAN-HCT) 160-12.5 MG tablet    Sig: Take 1 tablet by mouth daily.    Dispense:  90 tablet    Refill:  3    Patient Instructions  Medication Instructions:  Increase Lasix to 80 mg ( Take 2 40 mg Tablet for 5 Day) *If you need a refill on your cardiac medications before your next appointment, please call your pharmacy*   Lab Work: BMET Today If you have labs (blood work) drawn today and your tests are completely normal, you will receive your results only by: [place] (if you have MyChart) OR A paper copy in the mail If you have any lab test that is abnormal or we need to change your treatment, we will call you to review the results.   Testing/Procedures: No Testing   Follow-Up: At [facility], you and your health needs are  our priority.  As part of our continuing mission to provide you with exceptional heart care, we have created designated Provider Care Teams.  These Care Teams include your primary Cardiologist (physician) and Advanced Practice Providers (APPs -  Physician Assistants and Nurse Practitioners) who all work together to provide you with the care you need, when you need it.  We recommend signing up for the patient portal called "MyChart".  Sign up information is  provided on this After Visit Summary.  MyChart is used to connect with patients for Virtual Visits (Telemedicine).  Patients are able to view lab/test results, encounter notes, upcoming appointments, etc.  Non-urgent messages can be sent to your provider as well.   To learn more about what you can do with MyChart, go to [email or web address].    Your next appointment:   2 week(s)  The format for your next appointment:   In Person  Provider:   Caron Presume, PA-C    Then, Quay Burow, MD will plan to see you again in 5 month(s).    Other Instructions If Dry weight is 253 lbs. Before 5 day return to lasix 40 mg Daily.  Important Information About Sugar         Signed, Gaston Islam  10/17/2021 10:18 AM    [place]

## 2021-10-24 ENCOUNTER — Telehealth: Payer: Self-pay

## 2021-10-24 DIAGNOSIS — I10 Essential (primary) hypertension: Secondary | ICD-10-CM

## 2021-10-24 DIAGNOSIS — R600 Localized edema: Secondary | ICD-10-CM | POA: Diagnosis not present

## 2021-10-24 DIAGNOSIS — I2510 Atherosclerotic heart disease of native coronary artery without angina pectoris: Secondary | ICD-10-CM | POA: Diagnosis not present

## 2021-10-24 DIAGNOSIS — E782 Mixed hyperlipidemia: Secondary | ICD-10-CM | POA: Diagnosis not present

## 2021-10-24 NOTE — Telephone Encounter (Addendum)
Called patient regarding results. Patient had understanding of results.----- Message from Warren Lacy, PA-C sent at 10/20/2021 10:20 PM EDT ----- Kidney function & potassium normal.  Recheck again in 1 week after increasing diuretics.

## 2021-10-25 LAB — BASIC METABOLIC PANEL
BUN/Creatinine Ratio: 30 — ABNORMAL HIGH (ref 10–24)
BUN: 35 mg/dL — ABNORMAL HIGH (ref 8–27)
CO2: 24 mmol/L (ref 20–29)
Calcium: 9.7 mg/dL (ref 8.6–10.2)
Chloride: 100 mmol/L (ref 96–106)
Creatinine, Ser: 1.18 mg/dL (ref 0.76–1.27)
Glucose: 177 mg/dL — ABNORMAL HIGH (ref 70–99)
Potassium: 4.3 mmol/L (ref 3.5–5.2)
Sodium: 139 mmol/L (ref 134–144)
eGFR: 64 mL/min/{1.73_m2} (ref >59)

## 2021-10-30 ENCOUNTER — Telehealth: Payer: Self-pay

## 2021-10-30 NOTE — Telephone Encounter (Addendum)
Called patient regarding results. Patient had understanding of results----- Message from Warren Lacy, PA-C sent at 10/30/2021  2:46 PM EDT ----- Creatinine remains normal.  Potassium normal. Continue current treatment. Has follow-up 7/6.

## 2021-10-31 ENCOUNTER — Ambulatory Visit: Payer: Medicare PPO | Admitting: Physician Assistant

## 2021-11-07 ENCOUNTER — Ambulatory Visit: Payer: Medicare PPO | Admitting: Physician Assistant

## 2021-11-07 ENCOUNTER — Encounter: Payer: Medicare PPO | Admitting: General Surgery

## 2021-11-07 ENCOUNTER — Encounter: Payer: Self-pay | Admitting: Physician Assistant

## 2021-11-07 VITALS — BP 132/64 | HR 80 | Ht 69.5 in | Wt 256.0 lb

## 2021-11-07 DIAGNOSIS — E119 Type 2 diabetes mellitus without complications: Secondary | ICD-10-CM

## 2021-11-07 DIAGNOSIS — I2510 Atherosclerotic heart disease of native coronary artery without angina pectoris: Secondary | ICD-10-CM

## 2021-11-07 DIAGNOSIS — E785 Hyperlipidemia, unspecified: Secondary | ICD-10-CM | POA: Diagnosis not present

## 2021-11-07 DIAGNOSIS — I10 Essential (primary) hypertension: Secondary | ICD-10-CM

## 2021-11-07 DIAGNOSIS — R600 Localized edema: Secondary | ICD-10-CM | POA: Diagnosis not present

## 2021-11-07 NOTE — Progress Notes (Signed)
Cardiology Office Note:    Date:  11/09/2021   ID:  Kevin [name], DOB 04-11-1946, MRN [id number]  PCP:  Sharilyn Sites, MD   [facility] Cardiologist:  Quay Burow, MD     Referring MD: Sharilyn Sites, MD   Chief Complaint  Patient presents with   Follow-up    2 weeks.   Edema    History of Present Illness:    Kevin [name] is a 76 y.o. male with a hx of CAD s/p remote LAD and left circumflex stenting in 2001 and 2004 respectively, remote tobacco use, hypertension, hyperlipidemia, DM2, obstructive sleep apnea and obesity s/p lap band in 2011.  He had chest pain in 2016.  Cardiac catheterization at the time showed LAD stent had diffuse in-stent restenosis and 95% distal edge stenosis that was treated with DES, he also had ostial diagonal lesion that was jailed and 75% OM1 lesion as well as occluded nondominant RCA.  He has a history of palpitation following Synthroid medication.  Event monitor showed occasional PAC and PVC and short run of SVT.  Synthroid dose was lowered to 50 mcg and palpitation resolved.  More recently, patient called cardiology service in early June concerning swelling, weight gain and possible medication reaction.  He was admitted to the hospital with small bowel obstruction.  He underwent exploratory laparotomy and reduction of volvulus, lysis of adhesion and removal of gastric band.  He had acute on chronic renal insufficiency and the was given IV fluid.  Creatinine peaked at 3.4, baseline 1.1.  Creatinine was 1.2 on discharge.  His weight prior to surgery was 253 pounds, his weight was 271 pounds on discharge.  He had lower extremity edema despite Lasix 40 mg daily.  Lasix was later increased to twice a day for 2 days.  He called cardiology service concerning Plavix causing itchiness in the splotchy area, Plavix was discontinued.  When he was seen on 10/17/2021 by Caron Presume, PA-C, he continued to have lower extremity edema.  His weight was 261  pounds which is 8 pounds higher than his dry weight of 253 pounds.  HCTZ was resumed via Diovan-HCTZ.  Lasix was increased to 80 mg daily for 5 days.  Patient presents today for follow-up.  After 5 days of higher dose of Lasix, he is weight is now 256 pounds which is still 3 pounds higher than his baseline dry weight.  On physical exam, he has 1-2+ pitting edema, his lung is clear.  I think he is near euvolemic level.  I asked him to increase his Lasix to 80 mg for 3 days before going back to the previous 40 mg daily.  He need to keep a weight diary, if his weight increased by more than 3 pounds overnight or 5 pounds in his yet single week, I have instructed the patient to take extra dose of Lasix.  Recently, he did have gout flare, initially his PCP wanted to switch his valsartan-HCTZ back to valsartan by itself, however he has since went back to valsartan HCTZ and is doing well.  Blood pressure very well controlled.  We will continue on the current therapy.  Follow-up with Dr. [name] in November as previously scheduled  Past Medical History:  Diagnosis Date   Angio-edema    Asthma    as a child. no problems now.    CAD (coronary artery disease)    Cellulitis 5/11   left leg   Diabetes mellitus    Diverticulosis  Diverticulosis    HTN (hypertension)    Hyperlipidemia    Melanoma ([place]) 10/11   left foot   Melanoma of foot ([place]) 06/02/2011   Microscopic colitis 10/09/10   Colonoscopy dr [name]   Obesity    OSA on CPAP 01/20/2014   Psoriasis    S/P colonoscopy 07/03/03    Dr [name]->hyperplastic rectal polyp   S/P endoscopy 10/09/10   lap band intact, duodenal erosions   Seasonal allergies    Sleep apnea    Urticaria     Past Surgical History:  Procedure Laterality Date   ABDOMINAL SURGERY     ADENOIDECTOMY     APPENDECTOMY     CARDIAC CATHETERIZATION N/A 02/14/2015   Procedure: Left Heart Cath and Coronary Angiography;  Surgeon: Jettie Booze, MD;  Location: [facility];   Service: Cardiovascular;  Laterality: N/A;   CARDIAC CATHETERIZATION N/A 02/14/2015   Procedure: Coronary Stent Intervention;  Surgeon: Jettie Booze, MD;  Location: [facility];  Service: Cardiovascular;  Laterality: N/A;   COLONOSCOPY N/A 10/09/2010   normal rectum, long redundant colon, pancolonic diverticula, 81m cecal polyp, diminutive polyp at the appendiceal orifice ablated with tip of hot snare (Biopsies suggestive of microscopic colitis; polyp benign; small bowel ok)   CORONARY ANGIOPLASTY  2002/2005/2008   CORONARY ANGIOPLASTY WITH STENT PLACEMENT  06/11/2007   PTCA & stenting distal CX, PTCA & balloon angioplasty in-stent restenotic mid CX coronary artery stent   ESOPHAGOGASTRODUODENOSCOPY N/A 10/09/2010   Normal esophagus, lap band present, 2nd portion duodenum with erosions without frank ulcer   GASTRIC BANDING PORT REVISION N/A 09/20/2021   Procedure: GASTRIC BAND REMOVAL;  Surgeon: BVirl Cagey MD;  Location: [facility];  Service: General;  Laterality: N/A;   KNEE SURGERY     left   LAPAROTOMY N/A 09/20/2021   Procedure: EXPLORATORY LAPAROTOMY,  LYSIS OF ADHESIONS, REDUCTION OF VOLVULUS;  Surgeon: BVirl Cagey MD;  Location: [facility];  Service: General;  Laterality: N/A;   LEFT HEART CATHETERIZATION WITH CORONARY ANGIOGRAM N/A 07/15/2013   Procedure: LEFT HEART CATHETERIZATION WITH CORONARY ANGIOGRAM;  Surgeon: HSinclair Grooms MD;  [facility];  Service: Cardiovascular;  Laterality: N/A;   NM MYOCAR PERF WALL MOTION  06/24/2011   small mild fixed anteroseptal & anteroapical defects.   PENILE PROSTHESIS IMPLANT     SHOULDER SURGERY     left   SINOSCOPY     TONSILLECTOMY     TYMPANOSTOMY TUBE PLACEMENT      Current Medications: Current Meds  Medication Sig   Ascorbic Acid (VITAMIN C) 1000 MG tablet Take 1,000 mg by mouth daily.   aspirin 81 MG tablet Take 81 mg by mouth daily.   Coenzyme Q10 (CO Q 10) 10 MG CAPS Take 1 capsule by mouth  daily.   colchicine 0.6 MG tablet Take 0.6 mg by mouth daily as needed.   diphenhydrAMINE (BENADRYL) 25 MG tablet Take 25 mg by mouth every 6 (six) hours as needed.   fluticasone (FLONASE) 50 MCG/ACT nasal spray Place 2 sprays into both nostrils daily.   furosemide (LASIX) 40 MG tablet Take 40 mg by mouth daily.   glipiZIDE (GLUCOTROL) 5 MG tablet Take 5 mg by mouth daily as needed.   levothyroxine (SYNTHROID) 50 MCG tablet Take 50 mcg by mouth daily before breakfast.   metoprolol (TOPROL-XL) 100 MG 24 hr tablet Take 100 mg by mouth daily.   niacin (NIASPAN) 1000 MG CR tablet TAKE 1  TABLET BY MOUTH AT BEDTIME.   pravastatin (PRAVACHOL) 40 MG tablet Take 1 tablet (40 mg total) by mouth every evening.   RESVERATROL PO Take 1 capsule by mouth every morning.    sitaGLIPtin (JANUVIA) 100 MG tablet Take 100 mg by mouth daily.   tamsulosin (FLOMAX) 0.4 MG CAPS capsule Take 0.4 mg by mouth daily.   traMADol (ULTRAM) 50 MG tablet Take 1 tablet (50 mg total) by mouth every 6 (six) hours as needed for moderate pain.   valsartan (DIOVAN) 160 MG tablet Take 160 mg by mouth daily.   valsartan-hydrochlorothiazide (DIOVAN-HCT) 160-12.5 MG tablet Take 1 tablet by mouth daily.     Allergies:   Altace [ramipril], Avapro [irbesartan], Prednisone, Shellfish allergy, Tradjenta [linagliptin], Cephalexin, Clindamycin hcl, Codeine, Contrast media [iodinated contrast media], Fenofibrate, Glycotrol [support-500], Indomethacin, Minocycline hcl, Morphine and related, Sulfa drugs cross reactors, Tetracyclines & related, and Tree extract   Social History   Socioeconomic History   Marital status: Divorced    Spouse name: Not on file   Number of children: 1   Years of education: Not on file   Highest education level: Not on file  Occupational History   Occupation: Leisure centre manager: [employer]    Comment:   Tobacco Use   Smoking status: Former    Packs/day: 3.00    Years: 30.00    Total pack  years: 90.00    Types: Cigarettes    Start date: 11/18/1964    Quit date: 10/04/1994    Years since quitting: 27.1   Smokeless tobacco: Never   Tobacco comments:    quit smoking 19 years ago  Vaping Use   Vaping Use: Never used  Substance and Sexual Activity   Alcohol use: Yes    Alcohol/week: 0.0 standard drinks of alcohol    Comment: 3-4 times/year   Drug use: No   Sexual activity: Not Currently  Other Topics Concern   Not on file  Social History Narrative   Not on file   Social Determinants of Health   Financial Resource Strain: Not on file  Food Insecurity: Not on file  Transportation Needs: Not on file  Physical Activity: Not on file  Stress: Not on file  Social Connections: Not on file     Family History: The patient's family history includes Celiac disease (age of onset: 91) in an other family member; Coronary artery disease in his mother; Diabetes in his father; Liver cancer (age of onset: 35) in his brother. There is no history of Allergic rhinitis, Angioedema, Asthma, Atopy, Eczema, Immunodeficiency, or Urticaria.  ROS:   Please see the history of present illness.     All other systems reviewed and are negative.  EKGs/Labs/Other Studies Reviewed:    The following studies were reviewed today:  Myoview 11/12/2016 The left ventricular ejection fraction is normal (55-65%). Nuclear stress EF: 59%. There was no ST segment deviation noted during stress. The study is normal. This is a low risk study.   Normal resting and stress perfusion. No ischemia or infarction EF 59%  EKG:  EKG is not ordered today.    Recent Labs: 09/11/2021: ALT 17 09/22/2021: Hemoglobin 11.7; Magnesium 1.9; Platelets 243 10/24/2021: BUN 35; Creatinine, Ser 1.18; Potassium 4.3; Sodium 139  Recent Lipid Panel    Component Value Date/Time   CHOL 148 10/12/2017 0826   TRIG 145 10/12/2017 0826   HDL 45 10/12/2017 0826   CHOLHDL 3.3 10/12/2017 0826   CHOLHDL 3.1  12/06/2015 0844   VLDL 22  12/06/2015 0844   LDLCALC 74 10/12/2017 0826     Risk Assessment/Calculations:           Physical Exam:    VS:  BP 132/64 (BP Location: Left Arm, Patient Position: Sitting, Cuff Size: Large)   Pulse 80   Ht 5' 9.5" (1.765 m)   Wt 256 lb (116.1 kg)   BMI 37.26 kg/m     Wt Readings from Last 3 Encounters:  11/07/21 256 lb (116.1 kg)  10/17/21 261 lb 12.8 oz (118.8 kg)  10/15/21 262 lb (118.8 kg)     GEN:  Well nourished, well developed in no acute distress HEENT: Normal NECK: No JVD; No carotid bruits LYMPHATICS: No lymphadenopathy CARDIAC: RRR, no murmurs, rubs, gallops RESPIRATORY:  Clear to auscultation without rales, wheezing or rhonchi  ABDOMEN: Soft, non-tender, non-distended MUSCULOSKELETAL:  No edema; No deformity  SKIN: Warm and dry NEUROLOGIC:  Alert and oriented x 3 PSYCHIATRIC:  Normal affect   ASSESSMENT:    1. Leg edema   2. Coronary artery disease involving native coronary artery of native heart without angina pectoris   3. Essential hypertension   4. Hyperlipidemia LDL goal <70   5. Controlled type 2 diabetes mellitus without complication, without long-term current use of insulin (HCC)    PLAN:    In order of problems listed above:  Leg edema: I recommend to increase Lasix to 80 mg daily for 3 days before going back to 40 mg daily thereafter.  CAD: Denies any recent chest pain  Hypertension: Blood pressure stable  Hyperlipidemia: On pravastatin  DM2: Managed by primary care provider.           Medication Adjustments/Labs and Tests Ordered: Current medicines are reviewed at length with the patient today.  Concerns regarding medicines are outlined above.  No orders of the defined types were placed in this encounter.  No orders of the defined types were placed in this encounter.   Patient Instructions  Medication Instructions:  Temporarily increase Lasix to 80 mg daily for 3 days. Then go back to taking Lasix 40 mg daily  *If you  need a refill on your cardiac medications before your next appointment, please call your pharmacy*   Lab Work: NONE ordered at this time of appointment   If you have labs (blood work) drawn today and your tests are completely normal, you will receive your results only by: [place] (if you have MyChart) OR A paper copy in the mail If you have any lab test that is abnormal or we need to change your treatment, we will call you to review the results.   Testing/Procedures: NONE ordered at this time of appointment     Follow-Up: At [facility], you and your health needs are our priority.  As part of our continuing mission to provide you with exceptional heart care, we have created designated Provider Care Teams.  These Care Teams include your primary Cardiologist (physician) and Advanced Practice Providers (APPs -  Physician Assistants and Nurse Practitioners) who all work together to provide you with the care you need, when you need it.  We recommend signing up for the patient portal called "MyChart".  Sign up information is provided on this After Visit Summary.  MyChart is used to connect with patients for Virtual Visits (Telemedicine).  Patients are able to view lab/test results, encounter notes, upcoming appointments, etc.  Non-urgent messages can be sent to your provider as well.  To learn more about what you can do with MyChart, go to [email or web address].    Your next appointment:    March 19 2022  The format for your next appointment:   In Person  Provider:   Quay Burow, MD     Other Instructions   Important Information About Sugar         Hilbert Corrigan, [place]  11/09/2021 11:13 PM    [place]

## 2021-11-07 NOTE — Patient Instructions (Addendum)
Medication Instructions:  Temporarily increase Lasix to 80 mg daily for 3 days. Then go back to taking Lasix 40 mg daily  *If you need a refill on your cardiac medications before your next appointment, please call your pharmacy*   Lab Work: NONE ordered at this time of appointment   If you have labs (blood work) drawn today and your tests are completely normal, you will receive your results only by: [place] (if you have MyChart) OR A paper copy in the mail If you have any lab test that is abnormal or we need to change your treatment, we will call you to review the results.   Testing/Procedures: NONE ordered at this time of appointment     Follow-Up: At [facility], you and your health needs are our priority.  As part of our continuing mission to provide you with exceptional heart care, we have created designated Provider Care Teams.  These Care Teams include your primary Cardiologist (physician) and Advanced Practice Providers (APPs -  Physician Assistants and Nurse Practitioners) who all work together to provide you with the care you need, when you need it.  We recommend signing up for the patient portal called "MyChart".  Sign up information is provided on this After Visit Summary.  MyChart is used to connect with patients for Virtual Visits (Telemedicine).  Patients are able to view lab/test results, encounter notes, upcoming appointments, etc.  Non-urgent messages can be sent to your provider as well.   To learn more about what you can do with MyChart, go to [email or web address].    Your next appointment:    March 19 2022  The format for your next appointment:   In Person  Provider:   Quay Burow, MD     Other Instructions   Important Information About Sugar

## 2021-11-12 DIAGNOSIS — M10379 Gout due to renal impairment, unspecified ankle and foot: Secondary | ICD-10-CM | POA: Diagnosis not present

## 2021-11-12 DIAGNOSIS — N183 Chronic kidney disease, stage 3 unspecified: Secondary | ICD-10-CM | POA: Diagnosis not present

## 2021-11-12 DIAGNOSIS — E1129 Type 2 diabetes mellitus with other diabetic kidney complication: Secondary | ICD-10-CM | POA: Diagnosis not present

## 2021-11-12 DIAGNOSIS — K56609 Unspecified intestinal obstruction, unspecified as to partial versus complete obstruction: Secondary | ICD-10-CM | POA: Diagnosis not present

## 2021-11-12 DIAGNOSIS — Z6839 Body mass index (BMI) 39.0-39.9, adult: Secondary | ICD-10-CM | POA: Diagnosis not present

## 2021-11-12 DIAGNOSIS — I2510 Atherosclerotic heart disease of native coronary artery without angina pectoris: Secondary | ICD-10-CM | POA: Diagnosis not present

## 2021-11-15 ENCOUNTER — Encounter: Payer: Self-pay | Admitting: *Deleted

## 2021-11-18 ENCOUNTER — Other Ambulatory Visit: Payer: Self-pay | Admitting: Cardiovascular Disease

## 2021-11-21 ENCOUNTER — Encounter: Payer: Self-pay | Admitting: Internal Medicine

## 2021-11-28 ENCOUNTER — Telehealth: Payer: Self-pay | Admitting: Cardiovascular Disease

## 2021-11-28 NOTE — Telephone Encounter (Signed)
Becca from [place] called stating that Melvenia Needles, PA prescribed pt compression socks and she needs to know what kind and is it for knee or thigh length. Please advise.

## 2021-11-28 NOTE — Telephone Encounter (Signed)
Spoke to Ravinia with [employer].Advised knee hi compression hose minimal compression.

## 2021-12-16 ENCOUNTER — Encounter: Payer: Self-pay | Admitting: *Deleted

## 2021-12-16 ENCOUNTER — Encounter: Payer: Medicare PPO | Attending: Family Medicine | Admitting: Nutrition

## 2021-12-16 VITALS — Ht 69.5 in | Wt 257.0 lb

## 2021-12-16 DIAGNOSIS — E1165 Type 2 diabetes mellitus with hyperglycemia: Secondary | ICD-10-CM | POA: Insufficient documentation

## 2021-12-16 DIAGNOSIS — M109 Gout, unspecified: Secondary | ICD-10-CM | POA: Diagnosis not present

## 2021-12-16 DIAGNOSIS — E669 Obesity, unspecified: Secondary | ICD-10-CM | POA: Diagnosis not present

## 2021-12-16 DIAGNOSIS — I10 Essential (primary) hypertension: Secondary | ICD-10-CM | POA: Diagnosis not present

## 2021-12-16 DIAGNOSIS — E785 Hyperlipidemia, unspecified: Secondary | ICD-10-CM | POA: Diagnosis not present

## 2021-12-16 NOTE — Patient Instructions (Signed)
Procedure: Colonoscopy  Estimated body mass index is 36.83 kg/m as calculated from the following:   Height as of this encounter: 5' 9.5" (1.765 m).   Weight as of this encounter: 253 lb (114.8 kg).   Have you had a colonoscopy before?  10/09/10, Dr. [name]  Do you have family history of colon cancer  no  Do you have a family history of polyps? no  Previous colonoscopy with polyps removed? Yes 10 years ago per pt  Do you have a history colorectal cancer?   no  Are you diabetic?  yes  Do you have a prosthetic or mechanical heart valve? no  Do you have a pacemaker/defibrillator?   no  Have you had endocarditis/atrial fibrillation?  no  Do you use supplemental oxygen/CPAP?  no  Have you had joint replacement within the last 12 months?  no  Do you tend to be constipated or have to use laxatives?  no   Do you have history of alcohol use? If yes, how much and how often.  no  Do you have history or are you using drugs? If yes, what do are you  using?  no  Have you ever had a stroke/heart attack?  no  Have you ever had a heart or other vascular stent placed,? Yes last one 2016  Do you take weight loss medication? no   Do you take any blood-thinning medications such as: (Plavix, aspirin, Coumadin, Aggrenox, Brilinta, Xarelto, Eliquis, Pradaxa, Savaysa or Effient) plavix '75mg'$   If yes we need the name, milligram, dosage and who is prescribing doctor:  Dr. [name]             Current Outpatient Medications  Medication Sig Dispense Refill   Ascorbic Acid (VITAMIN C) 1000 MG tablet Take 1,000 mg by mouth daily.     aspirin 81 MG tablet Take 81 mg by mouth daily.     Coenzyme Q10 (CO Q 10) 10 MG CAPS Take 1 capsule by mouth daily.     colchicine 0.6 MG tablet Take 0.6 mg by mouth daily as needed. (Patient not taking: Reported on 12/16/2021)     diphenhydrAMINE (BENADRYL) 25 MG tablet Take 25 mg by mouth every 6 (six) hours as needed.     fluticasone (FLONASE) 50 MCG/ACT nasal spray  Place 2 sprays into both nostrils daily.     furosemide (LASIX) 40 MG tablet Take 40 mg by mouth daily.     glipiZIDE (GLUCOTROL) 5 MG tablet Take 5 mg by mouth daily as needed.     levothyroxine (SYNTHROID) 50 MCG tablet Take 50 mcg by mouth daily before breakfast.     metoprolol (TOPROL-XL) 100 MG 24 hr tablet Take 100 mg by mouth daily.     niacin (NIASPAN) 1000 MG CR tablet TAKE 1 TABLET BY MOUTH AT BEDTIME. 30 tablet 0   pravastatin (PRAVACHOL) 40 MG tablet Take 1 tablet (40 mg total) by mouth every evening. 90 tablet 3   RESVERATROL PO Take 1 capsule by mouth every morning.      sitaGLIPtin (JANUVIA) 100 MG tablet Take 100 mg by mouth daily.     tamsulosin (FLOMAX) 0.4 MG CAPS capsule Take 0.4 mg by mouth daily.     traMADol (ULTRAM) 50 MG tablet Take 1 tablet (50 mg total) by mouth every 6 (six) hours as needed for moderate pain. (Patient not taking: Reported on 12/16/2021) 10 tablet 0   valsartan (DIOVAN) 160 MG tablet Take 160 mg by mouth daily. (Patient not  taking: Reported on 12/16/2021)     valsartan-hydrochlorothiazide (DIOVAN-HCT) 160-12.5 MG tablet Take 1 tablet by mouth daily. 90 tablet 3   No current facility-administered medications for this visit.    Allergies  Allergen Reactions   Altace [Ramipril] Shortness Of Breath   Avapro [Irbesartan] Shortness Of Breath   Prednisone Anaphylaxis, Swelling and Other (See Comments)    SWELLING OF THE LIPS   Shellfish Allergy Anaphylaxis    Shrimp, selfish   Tradjenta [Linagliptin] Shortness Of Breath   Cephalexin Hives and Swelling   Clindamycin Hcl Hives and Swelling   Codeine Nausea And Vomiting and Other (See Comments)    AFFECTS HEART   Contrast Media [Iodinated Contrast Media]     Can be administered if Benadryl is administered prior to prevent reaction    Fenofibrate Other (See Comments)    "My skin started peeling off"   Glycotrol [Support-500] Other (See Comments)    unknown   Indomethacin Other (See Comments)     UNKNOWN REACTION   Minocycline Hcl Hives and Swelling   Morphine And Related Nausea And Vomiting    PROJECTILE VOMITING   Sulfa Drugs Cross Reactors Hives and Swelling   Tetracyclines & Related    Tree Extract Itching

## 2021-12-16 NOTE — Patient Instructions (Signed)
Goals Established by Pt Eat three meals per day at times discussed Focus on more whole food plant based foods Keep drinking water Test blood sugars twice a day. Don't eat after 7 pm.

## 2021-12-16 NOTE — Progress Notes (Addendum)
Medical Nutrition Therapy  Appointment Start time:  0900  Appointment End time:  1000  Primary concerns today:  DM Type 2 and Gout Referral diagnosis: E11.8 M10.9 Preferred learning style: Read  Learning readiness: Ready    NUTRITION ASSESSMENT  76 yr old Kevin [name] here for his diabetes and gout. He notes he had bowel surgery in May and was in the hospital and had a gout attack during that admission. He notes he had never had Gout before.  Desires information to prevent gout and what to eat to help  lose weight and improve his diabetes. PCP Dr. [name]. Had a lap band removed during the surgery also. Lap Band was done at [facility],  2006 or 2011. He didn't lose any weight by having the lap band done. Retired from the [employer]. Dx 20+ years ago with DM. His whole family has DM. Brother died of liver cancer. He is not testing blood sugars. He notes he eats 1 meal a day usually. May have a protein bar at some other time during the day. Has been avoiding carbs and eating more protein. He has lost about 6 lbs in the last few months.  Anthropometrics  Wt Readings from Last 3 Encounters:  11/07/21 256 lb (116.1 kg)  10/17/21 261 lb 12.8 oz (118.8 kg)  10/15/21 262 lb (118.8 kg)   Ht Readings from Last 3 Encounters:  11/07/21 5' 9.5" (1.765 m)  10/17/21 5' 9.5" (1.765 m)  10/15/21 5' 9.5" (1.765 m)   There is no height or weight on file to calculate BMI. '@BMIFA'$ @ Facility age limit for growth %iles is 20 years. Facility age limit for growth %iles is 20 years.    Clinical Medical Hx: See chart Medications: Januvia, Glipizide,  Labs: a1c 7.7%.    Latest Ref Rng & Units 10/24/2021    8:16 AM 10/17/2021   10:35 AM 09/23/2021    4:43 AM  CMP  Glucose 70 - 99 mg/dL 177  128  194   BUN 8 - 27 mg/dL 35  17  16   Creatinine 0.76 - 1.27 mg/dL 1.18  1.09  1.22   Sodium 134 - 144 mmol/L 139  140  139   Potassium 3.5 - 5.2 mmol/L 4.3  4.1  4.4   Chloride 96 - 106 mmol/L 100  101   112   CO2 20 - 29 mmol/L '24  26  24   '$ Calcium 8.6 - 10.2 mg/dL 9.7  10.0  8.4    Lipid Panel     Component Value Date/Time   CHOL 148 10/12/2017 0826   TRIG 145 10/12/2017 0826   HDL 45 10/12/2017 0826   CHOLHDL 3.3 10/12/2017 0826   CHOLHDL 3.1 12/06/2015 0844   VLDL 22 12/06/2015 0844   LDLCALC 74 10/12/2017 0826   LABVLDL 29 10/12/2017 0826     Notable Signs/Symptoms: None currently  Lifestyle & Dietary Hx Lives by himself. Divorced. Eats out some and cooks at home some. Eats only 1 meal per day-usually breakfast   Estimated daily fluid intake: 128 oz Supplements: Vit C Sleep: 8 hrs Stress / self-care: none Current average weekly physical activity: Walks some but not much due to knees needing to be replace.  24-Hr Dietary Recall First Meal:  8 am  2 eggs, cheese, sausage and coffee- swt and low; and 2 slice toast,  Snack:  Second Meal: Protein bar Snack:  Third Meal:  Snack:  Beverages: water  Estimated Energy Needs Calories: 1800 Carbohydrate:  200g Protein: 135g Fat: 50g   NUTRITION DIAGNOSIS  NB-1.1 Food and nutrition-related knowledge deficit As related to Diabetes Type 2.  As evidenced by A1C 7.7%..   NUTRITION INTERVENTION  Nutrition education (E-1) on the following topics:  Nutrition and Diabetes education provided on My Plate, CHO counting, meal planning, portion sizes, timing of meals, avoiding snacks between meals unless having a low blood sugar, target ranges for A1C and blood sugars, signs/symptoms and treatment of hyper/hypoglycemia, monitoring blood sugars, taking medications as prescribed, benefits of exercising 30 minutes per day and prevention of complications of DM.  Lifestyle Medicine  - Whole Food, Plant Predominant Nutrition is highly recommended: Eat Plenty of vegetables, Mushrooms, fruits, Legumes, Whole Grains, Nuts, seeds in lieu of processed meats, processed snacks/pastries red meat, poultry, eggs.    -It is better to avoid simple  carbohydrates including: Cakes, Sweet Desserts, Ice Cream, Soda (diet and regular), Sweet Tea, Candies, Chips, Cookies, Store Bought Juices, Alcohol in Excess of  1-2 drinks a day, Lemonade,  Artificial Sweeteners, Doughnuts, Coffee Creamers, "Sugar-free" Products, etc, etc.  This is not a complete list.....  Exercise: If you are able: 30 -60 minutes a day ,4 days a week, or 150 minutes a week.  The longer the better.  Combine stretch, strength, and aerobic activities.  If you were told in the past that you have high risk for cardiovascular diseases, you may seek evaluation by your heart doctor prior to initiating moderate to intense exercise programs.   Handouts Provided Include  Lifestyle Medicine Gout handout  Learning Style & Readiness for Change Teaching method utilized: Visual & Auditory  Demonstrated degree of understanding via: Teach Back  Barriers to learning/adherence to lifestyle change: none  Goals Established by Pt Eat three meals per day at times discussed Focus on more whole food plant based foods Keep drinking water Test blood sugars twice a day. Don't eat after 7 pm.   MONITORING & EVALUATION Dietary intake, weekly physical activity, and blood sugars in 2-3 months.  Next Steps  Patient is to work on better food choices and eating 3 meals per day.[name]

## 2021-12-16 NOTE — Addendum Note (Signed)
Addended by: Tamsen Roers D on: 12/16/2021 11:09 AM   Modules accepted: Orders

## 2021-12-19 ENCOUNTER — Encounter: Payer: Self-pay | Admitting: *Deleted

## 2021-12-19 NOTE — Progress Notes (Signed)
OV sch'd 01/13/22, apt letter mailed to patient

## 2021-12-19 NOTE — Progress Notes (Signed)
Please schedule per Magda Paganini. Thanks!

## 2021-12-19 NOTE — Progress Notes (Signed)
Patient will need in person OV due to heart disease/leg edema, recent exploratory surgery with reduction of volvulus and removal of gastric band. Need to assess prior to consideration of colonoscopy.

## 2021-12-24 ENCOUNTER — Other Ambulatory Visit: Payer: Self-pay | Admitting: Cardiovascular Disease

## 2022-01-13 ENCOUNTER — Ambulatory Visit: Payer: Medicare PPO | Admitting: Gastroenterology

## 2022-01-14 DIAGNOSIS — I10 Essential (primary) hypertension: Secondary | ICD-10-CM | POA: Diagnosis not present

## 2022-01-14 DIAGNOSIS — Z6841 Body Mass Index (BMI) 40.0 and over, adult: Secondary | ICD-10-CM | POA: Diagnosis not present

## 2022-01-14 DIAGNOSIS — N183 Chronic kidney disease, stage 3 unspecified: Secondary | ICD-10-CM | POA: Diagnosis not present

## 2022-01-14 DIAGNOSIS — Z23 Encounter for immunization: Secondary | ICD-10-CM | POA: Diagnosis not present

## 2022-01-14 DIAGNOSIS — E782 Mixed hyperlipidemia: Secondary | ICD-10-CM | POA: Diagnosis not present

## 2022-01-14 DIAGNOSIS — E119 Type 2 diabetes mellitus without complications: Secondary | ICD-10-CM | POA: Diagnosis not present

## 2022-01-14 DIAGNOSIS — G4733 Obstructive sleep apnea (adult) (pediatric): Secondary | ICD-10-CM | POA: Diagnosis not present

## 2022-01-14 DIAGNOSIS — E1129 Type 2 diabetes mellitus with other diabetic kidney complication: Secondary | ICD-10-CM | POA: Diagnosis not present

## 2022-01-21 ENCOUNTER — Emergency Department (HOSPITAL_COMMUNITY): Payer: Medicare PPO

## 2022-01-21 ENCOUNTER — Emergency Department (HOSPITAL_COMMUNITY)
Admission: EM | Admit: 2022-01-21 | Discharge: 2022-01-21 | Disposition: A | Discharge status: Home / Self Care | Payer: Medicare PPO | Attending: Emergency Medicine | Admitting: Emergency Medicine

## 2022-01-21 ENCOUNTER — Other Ambulatory Visit: Payer: Self-pay

## 2022-01-21 ENCOUNTER — Encounter (HOSPITAL_COMMUNITY): Payer: Self-pay

## 2022-01-21 DIAGNOSIS — R197 Diarrhea, unspecified: Secondary | ICD-10-CM | POA: Diagnosis not present

## 2022-01-21 DIAGNOSIS — Z7982 Long term (current) use of aspirin: Secondary | ICD-10-CM | POA: Insufficient documentation

## 2022-01-21 DIAGNOSIS — R109 Unspecified abdominal pain: Secondary | ICD-10-CM | POA: Insufficient documentation

## 2022-01-21 DIAGNOSIS — R112 Nausea with vomiting, unspecified: Secondary | ICD-10-CM | POA: Diagnosis not present

## 2022-01-21 LAB — COMPREHENSIVE METABOLIC PANEL
ALT: 17 U/L (ref 0–44)
AST: 17 U/L (ref 15–41)
Albumin: 4.3 g/dL (ref 3.5–5.0)
Alkaline Phosphatase: 39 U/L (ref 38–126)
Anion gap: 13 (ref 5–15)
BUN: 45 mg/dL — ABNORMAL HIGH (ref 8–23)
CO2: 26 mmol/L (ref 22–32)
Calcium: 9.9 mg/dL (ref 8.9–10.3)
Chloride: 100 mmol/L (ref 98–111)
Creatinine, Ser: 1.39 mg/dL — ABNORMAL HIGH (ref 0.61–1.24)
GFR, Estimated: 53 mL/min — ABNORMAL LOW (ref >60)
Glucose, Bld: 131 mg/dL — ABNORMAL HIGH (ref 70–99)
Potassium: 3.9 mmol/L (ref 3.5–5.1)
Sodium: 139 mmol/L (ref 135–145)
Total Bilirubin: 0.7 mg/dL (ref 0.3–1.2)
Total Protein: 7.8 g/dL (ref 6.5–8.1)

## 2022-01-21 LAB — CBC
HCT: 34.6 % — ABNORMAL LOW (ref 39.0–52.0)
Hemoglobin: 11.6 g/dL — ABNORMAL LOW (ref 13.0–17.0)
MCH: 31.5 pg (ref 26.0–34.0)
MCHC: 33.5 g/dL (ref 30.0–36.0)
MCV: 94 fL (ref 80.0–100.0)
Platelets: 201 10*3/uL (ref 150–400)
RBC: 3.68 MIL/uL — ABNORMAL LOW (ref 4.22–5.81)
RDW: 13.1 % (ref 11.5–15.5)
WBC: 5.6 10*3/uL (ref 4.0–10.5)
nRBC: 0 % (ref 0.0–0.2)

## 2022-01-21 LAB — URINALYSIS, ROUTINE W REFLEX MICROSCOPIC
Bilirubin Urine: NEGATIVE
Glucose, UA: NEGATIVE mg/dL
Hgb urine dipstick: NEGATIVE
Ketones, ur: NEGATIVE mg/dL
Leukocytes,Ua: NEGATIVE
Nitrite: NEGATIVE
Protein, ur: NEGATIVE mg/dL
Specific Gravity, Urine: 1.012 (ref 1.005–1.030)
pH: 6 (ref 5.0–8.0)

## 2022-01-21 LAB — LIPASE, BLOOD: Lipase: 39 U/L (ref 11–51)

## 2022-01-21 MED ORDER — ONDANSETRON HCL 4 MG/2ML IJ SOLN
4.0000 mg | Freq: Once | INTRAMUSCULAR | Status: AC
  Administered 2022-01-21: 4 mg via INTRAVENOUS
  Filled 2022-01-21: qty 2

## 2022-01-21 MED ORDER — PANTOPRAZOLE SODIUM 40 MG PO TBEC: 40.0000 mg | DELAYED_RELEASE_TABLET | Freq: Every day | ORAL | 0 refills | Status: DC

## 2022-01-21 MED ORDER — FENTANYL CITRATE PF 50 MCG/ML IJ SOSY
50.0000 ug | PREFILLED_SYRINGE | Freq: Once | INTRAMUSCULAR | Status: DC
  Filled 2022-01-21: qty 1

## 2022-01-21 MED ORDER — ONDANSETRON HCL 4 MG PO TABS: 4.0000 mg | ORAL_TABLET | Freq: Four times a day (QID) | ORAL | 0 refills | Status: DC

## 2022-01-21 MED ORDER — SODIUM CHLORIDE 0.9 % IV BOLUS
500.0000 mL | Freq: Once | INTRAVENOUS | Status: AC
  Administered 2022-01-21: 500 mL via INTRAVENOUS

## 2022-01-21 NOTE — Discharge Instructions (Signed)
Frequent small sips of clear fluids today, bland diet as tolerated.  Also, I recommend that you drink plenty of water over the next several days.  Your kidney tests today were slightly abnormal, this was felt to be related to some dehydration.  You will need to have your kidney test rechecked in 1 to 2 weeks  Your primary care provider can arrange this for you.  Return to the emergency department for any new or worsening symptoms such as increasing abdominal pain, fever, persistent vomiting

## 2022-01-21 NOTE — ED Provider Notes (Signed)
[name] EMERGENCY DEPARTMENT Provider Note   CSN: [id number] Arrival date & time: 01/21/22  1327     History  Chief Complaint  Patient presents with   Abdominal Pain    Kevin [name] is a 76 y.o. male.   Abdominal Pain Associated symptoms: diarrhea, nausea and vomiting   Associated symptoms: no chest pain, no chills, no dysuria, no fever and no shortness of breath         Kevin [name] is a 76 y.o. male with past medical history significant for hypertension, type 2 diabetes, hyperlipidemia, asthma and CAD who presents to the Emergency Department complaining of mid abdominal pain x 3 days.  He notes crampy abdominal pain that has been associated with nausea and 2 episodes of vomiting with some loose stools since onset.  He notes having a area that is "swollen" to the left of a previous abdominal surgery incision.  He was seen here in May of this year and underwent exploratory laparotomy for volvulus and removal of lap band.  He denies any complications from his surgery.  He does note having swelling along the incision that is only noticeable when he stands or strains to have a bowel movement  He denies any fever, chills, urinary symptoms, chest pain or shortness of breath.   Home Medications Prior to Admission medications   Medication Sig Start Date End Date Taking? Authorizing Provider  Ascorbic Acid (VITAMIN C) 1000 MG tablet Take 1,000 mg by mouth daily.    [provider]  aspirin 81 MG tablet Take 81 mg by mouth daily.    [provider]  Coenzyme Q10 (CO Q 10) 10 MG CAPS Take 1 capsule by mouth daily.    [provider]  colchicine 0.6 MG tablet Take 0.6 mg by mouth daily as needed. Patient not taking: Reported on 12/16/2021    [provider]  diphenhydrAMINE (BENADRYL) 25 MG tablet Take 25 mg by mouth every 6 (six) hours as needed.    [provider]  fluticasone (FLONASE) 50 MCG/ACT nasal spray Place 2 sprays into both  nostrils daily.    [provider]  furosemide (LASIX) 40 MG tablet Take 40 mg by mouth daily.    [provider]  glipiZIDE (GLUCOTROL) 5 MG tablet Take 5 mg by mouth daily as needed.    [provider]  levothyroxine (SYNTHROID) 50 MCG tablet Take 50 mcg by mouth daily before breakfast.    [provider]  metoprolol (TOPROL-XL) 100 MG 24 hr tablet Take 100 mg by mouth daily.    [provider]  niacin (NIASPAN) 1000 MG CR tablet TAKE 1 TABLET BY MOUTH AT BEDTIME. 12/24/21   Lorretta Harp, MD  pravastatin (PRAVACHOL) 40 MG tablet Take 1 tablet (40 mg total) by mouth every evening. 04/18/19   Lorretta Harp, MD  RESVERATROL PO Take 1 capsule by mouth every morning.     [provider]  sitaGLIPtin (JANUVIA) 100 MG tablet Take 100 mg by mouth daily.    [provider]  tamsulosin (FLOMAX) 0.4 MG CAPS capsule Take 0.4 mg by mouth daily. 07/17/20   [provider]  traMADol (ULTRAM) 50 MG tablet Take 1 tablet (50 mg total) by mouth every 6 (six) hours as needed for moderate pain. Patient not taking: Reported on 12/16/2021 09/23/21   Orson Eva, MD  valsartan (DIOVAN) 160 MG tablet Take 160 mg by mouth daily. Patient not taking: Reported on 12/16/2021  [provider]  valsartan-hydrochlorothiazide (DIOVAN-HCT) 160-12.5 MG tablet Take 1 tablet by mouth daily. 10/17/21   Warren Lacy, PA-C      Allergies    Altace [ramipril], Avapro [irbesartan], Prednisone, Shellfish allergy, Tradjenta [linagliptin], Cephalexin, Clindamycin hcl, Codeine, Contrast media [iodinated contrast media], Fenofibrate, Glycotrol [support-500], Indomethacin, Minocycline hcl, Morphine and related, Sulfa drugs cross reactors, Tetracyclines & related, and Tree extract    Review of Systems   Review of Systems  Constitutional:  Positive for appetite change. Negative for chills and fever.  HENT:  Negative for trouble swallowing.    Respiratory:  Negative for shortness of breath.   Cardiovascular:  Negative for chest pain, palpitations and leg swelling.  Gastrointestinal:  Positive for abdominal pain, diarrhea, nausea and vomiting.  Genitourinary:  Negative for difficulty urinating, dysuria and flank pain.  Musculoskeletal:  Negative for arthralgias and back pain.  Skin:  Negative for rash.  Neurological:  Negative for dizziness, weakness and numbness.    Physical Exam Updated Vital Signs BP (!) 148/76 (BP Location: Left Arm)   Pulse 65   Temp 98.1 F (36.7 C) (Oral)   Resp 18   Ht 5' 9.5" (1.765 m)   Wt 114.8 kg   SpO2 99%   BMI 36.83 kg/m  Physical Exam Vitals and nursing note reviewed.  Constitutional:      General: He is not in acute distress.    Appearance: Normal appearance. He is well-developed. He is not ill-appearing or toxic-appearing.  HENT:     Mouth/Throat:     Mouth: Mucous membranes are moist.  Cardiovascular:     Rate and Rhythm: Normal rate and regular rhythm.     Pulses: Normal pulses.  Pulmonary:     Effort: Pulmonary effort is normal. No respiratory distress.  Abdominal:     Palpations: Abdomen is soft.     Hernia: A hernia is present. Hernia is present in the ventral area.     Comments: Patient has some mild tenderness to the mid abdomen.  There is no guarding or rebound tenderness.  No abdominal distention.  There is a visible ventral hernia that spontaneously reduces when patient is supine and at rest.  Surgical scar appears well-healed.  No surrounding erythema  Musculoskeletal:        General: Normal range of motion.     Right lower leg: No edema.     Left lower leg: No edema.  Skin:    General: Skin is warm.  Neurological:     General: No focal deficit present.     Mental Status: He is alert.     Motor: No weakness.     ED Results / Procedures / Treatments   Labs (all labs ordered are listed, but only abnormal results are displayed) Labs Reviewed  COMPREHENSIVE  METABOLIC PANEL - Abnormal; Notable for the following components:      Result Value   Glucose, Bld 131 (*)    BUN 45 (*)    Creatinine, Ser 1.39 (*)    GFR, Estimated 53 (*)    All other components within normal limits  CBC - Abnormal; Notable for the following components:   RBC 3.68 (*)    Hemoglobin 11.6 (*)    HCT 34.6 (*)    All other components within normal limits  LIPASE, BLOOD  URINALYSIS, ROUTINE W REFLEX MICROSCOPIC    EKG None  Radiology CT ABDOMEN PELVIS WO CONTRAST  Result Date: 01/21/2022 CLINICAL DATA:  Nausea vomiting, bowel obstruction suspected.  Abdominal pain. EXAM: CT ABDOMEN AND PELVIS WITHOUT CONTRAST TECHNIQUE: Multidetector CT imaging of the abdomen and pelvis was performed following the standard protocol without IV contrast. RADIATION DOSE REDUCTION: This exam was performed according to the departmental dose-optimization program which includes automated exposure control, adjustment of the mA and/or kV according to patient size and/or use of iterative reconstruction technique. COMPARISON:  CT examination dated Sep 17, 2021 FINDINGS: Lower chest: No acute abnormality. Hepatobiliary: No focal liver abnormality is seen. No gallstones, gallbladder wall thickening, or biliary dilatation. Pancreas: Moderate atrophy of the pancreas. No pancreatic ductal dilatation or surrounding inflammatory changes. Spleen: Normal in size. Calcified cyst or hemangioma in the posterior aspect of the spleen. Adrenals/Urinary Tract: Adrenal glands are unremarkable. Kidneys are normal, without renal calculi, focal lesion, or hydronephrosis. Bladder is unremarkable. Stomach/Bowel: Stomach is within normal limits. Appendix not identified. No evidence of bowel wall thickening, distention, or inflammatory changes. Scattered colonic diverticulosis without evidence of acute diverticulitis Vascular/Lymphatic: Aortic atherosclerosis. No enlarged abdominal or pelvic lymph nodes. Reproductive: Prostate is  enlarged measuring approximately 5.9 x 4.9 cm. Penile implant is noted. Other: No abdominal wall hernia or abnormality. No abdominopelvic ascites. Musculoskeletal: Moderate multilevel degenerate disc disease of the lumbar spine prominent at L5-S1 and L2-L3. No acute osseous abnormality IMPRESSION: 1. Bowel loops are normal in caliber. No evidence of obstruction, colitis or diverticulitis. 2. Enlarged prostate. 3. Moderate multilevel degenerate disc disease of the lumbar spine, prominent at L2-L3 and L5-S1. Electronically Signed   By: Keane Police D.O.   On: 01/21/2022 16:23    Procedures Procedures    Medications Ordered in ED Medications  fentaNYL (SUBLIMAZE) injection 50 mcg (50 mcg Intravenous Not Given 01/21/22 1653)  ondansetron (ZOFRAN) injection 4 mg (4 mg Intravenous Given 01/21/22 1700)  sodium chloride 0.9 % bolus 500 mL (500 mLs Intravenous New Bag/Given 01/21/22 1659)    ED Course/ Medical Decision Making/ A&P                           Medical Decision Making Patient here for evaluation of abdominal pain for 3 days.  Pain is along the mid abdomen near a surgical scar.  He endorses having nausea with 2 episodes of vomiting and some loose stools.  No melena or hematochezia.  On exam, patient well-appearing nontoxic.  Vital signs reassuring.  He does have a visible ventral hernia when standing or when he bears down, this spontaneously reduces at rest and in supine position.  Differential diagnosis would include but not limited to incarcerated hernia, SBO, gastroenteritis, infection  Plan includes labs, IV fluids, antiemetic and pain medication.  He will need CT imaging of the abdomen for further evaluation as SBO is of high clinical concern given recent surgery.  I feel that incarcerated hernia is less likely given spontaneous reduction of the hernia when patient is supine.  He has allergy to contrast, so will proceed with Noncon CT.  Amount and/or Complexity of Data Reviewed Labs:  ordered.    Details: Labs interpreted by me, no evidence of leukocytosis.  Chemistries show elevated BUN and creatinine which is thought to be prerenal and secondary to some mild dehydration.  Will provide IV fluids.  Lipase unremarkable, urinalysis without evidence of infection. Radiology: ordered.    Details: CT abdomen and pelvis without evidence of obstruction colitis or diverticulitis. Discussion of management or test interpretation with external provider(s): Patient here with likely gastroenteritis.  He had a overall reassuring work-up.  He does have some likely dehydration and mild changes of his kidney functions.  I have addressed this with him and encouraged increased fluid intake over the next several days.  He is agreeable to follow-up with PCP to have his kidney functions rechecked in 1 week.  We will start PPI and antiemetic for symptomatic relief.  Patient appears appropriate for discharge home, all questions were answered.  Return precautions discussed.  Risk Prescription drug management.           Final Clinical Impression(s) / ED Diagnoses Final diagnoses:  Nausea vomiting and diarrhea    Rx / DC Orders ED Discharge Orders     None         Bufford Lope 01/23/22 1645    Noemi Chapel, MD 02/03/22 1323

## 2022-01-21 NOTE — ED Triage Notes (Signed)
Pt presents to ED with complaints of mid abdominal pain. Pt states "something had popped out on the left lower side." Pt reports nausea, vomiting x 1, and diarrhea

## 2022-01-27 ENCOUNTER — Other Ambulatory Visit: Payer: Self-pay

## 2022-01-27 ENCOUNTER — Other Ambulatory Visit: Payer: Self-pay | Admitting: Cardiovascular Disease

## 2022-01-27 ENCOUNTER — Encounter (HOSPITAL_COMMUNITY): Payer: Self-pay | Admitting: Emergency Medicine

## 2022-01-27 ENCOUNTER — Emergency Department (HOSPITAL_COMMUNITY)
Admission: EM | Admit: 2022-01-27 | Discharge: 2022-01-28 | Discharge status: Against Medical Advice | Payer: Medicare PPO | Attending: Emergency Medicine | Admitting: Emergency Medicine

## 2022-01-27 DIAGNOSIS — Z7902 Long term (current) use of antithrombotics/antiplatelets: Secondary | ICD-10-CM | POA: Insufficient documentation

## 2022-01-27 DIAGNOSIS — I10 Essential (primary) hypertension: Secondary | ICD-10-CM | POA: Insufficient documentation

## 2022-01-27 DIAGNOSIS — I2510 Atherosclerotic heart disease of native coronary artery without angina pectoris: Secondary | ICD-10-CM | POA: Diagnosis not present

## 2022-01-27 DIAGNOSIS — K5900 Constipation, unspecified: Secondary | ICD-10-CM | POA: Insufficient documentation

## 2022-01-27 DIAGNOSIS — Z7985 Long-term (current) use of injectable non-insulin antidiabetic drugs: Secondary | ICD-10-CM | POA: Diagnosis not present

## 2022-01-27 DIAGNOSIS — Z5321 Procedure and treatment not carried out due to patient leaving prior to being seen by health care provider: Secondary | ICD-10-CM | POA: Insufficient documentation

## 2022-01-27 DIAGNOSIS — R600 Localized edema: Secondary | ICD-10-CM | POA: Diagnosis not present

## 2022-01-27 DIAGNOSIS — I700 Atherosclerosis of aorta: Secondary | ICD-10-CM | POA: Diagnosis not present

## 2022-01-27 DIAGNOSIS — E119 Type 2 diabetes mellitus without complications: Secondary | ICD-10-CM | POA: Insufficient documentation

## 2022-01-27 DIAGNOSIS — R109 Unspecified abdominal pain: Secondary | ICD-10-CM | POA: Insufficient documentation

## 2022-01-27 DIAGNOSIS — Z87891 Personal history of nicotine dependence: Secondary | ICD-10-CM | POA: Diagnosis not present

## 2022-01-27 DIAGNOSIS — Z7984 Long term (current) use of oral hypoglycemic drugs: Secondary | ICD-10-CM | POA: Diagnosis not present

## 2022-01-27 DIAGNOSIS — R110 Nausea: Secondary | ICD-10-CM | POA: Diagnosis not present

## 2022-01-27 DIAGNOSIS — Z79899 Other long term (current) drug therapy: Secondary | ICD-10-CM | POA: Diagnosis not present

## 2022-01-27 LAB — COMPREHENSIVE METABOLIC PANEL
ALT: 20 U/L (ref 0–44)
AST: 15 U/L (ref 15–41)
Albumin: 3.8 g/dL (ref 3.5–5.0)
Alkaline Phosphatase: 38 U/L (ref 38–126)
Anion gap: 7 (ref 5–15)
BUN: 27 mg/dL — ABNORMAL HIGH (ref 8–23)
CO2: 27 mmol/L (ref 22–32)
Calcium: 9.3 mg/dL (ref 8.9–10.3)
Chloride: 104 mmol/L (ref 98–111)
Creatinine, Ser: 1.2 mg/dL (ref 0.61–1.24)
GFR, Estimated: >60 mL/min (ref >60)
Glucose, Bld: 165 mg/dL — ABNORMAL HIGH (ref 70–99)
Potassium: 4 mmol/L (ref 3.5–5.1)
Sodium: 138 mmol/L (ref 135–145)
Total Bilirubin: 0.8 mg/dL (ref 0.3–1.2)
Total Protein: 6.7 g/dL (ref 6.5–8.1)

## 2022-01-27 LAB — CBC
HCT: 30.2 % — ABNORMAL LOW (ref 39.0–52.0)
Hemoglobin: 10 g/dL — ABNORMAL LOW (ref 13.0–17.0)
MCH: 31.8 pg (ref 26.0–34.0)
MCHC: 33.1 g/dL (ref 30.0–36.0)
MCV: 96.2 fL (ref 80.0–100.0)
Platelets: 156 10*3/uL (ref 150–400)
RBC: 3.14 MIL/uL — ABNORMAL LOW (ref 4.22–5.81)
RDW: 13.3 % (ref 11.5–15.5)
WBC: 5 10*3/uL (ref 4.0–10.5)
nRBC: 0 % (ref 0.0–0.2)

## 2022-01-27 LAB — LIPASE, BLOOD: Lipase: 35 U/L (ref 11–51)

## 2022-01-27 NOTE — ED Triage Notes (Signed)
Pt tot he ED with complaints of constipation with abdominal pain for the last week.  The patient is also complaining about nausea, but has not vomited.

## 2022-01-28 ENCOUNTER — Encounter (HOSPITAL_COMMUNITY): Payer: Self-pay | Admitting: *Deleted

## 2022-01-28 ENCOUNTER — Other Ambulatory Visit: Payer: Self-pay

## 2022-01-28 ENCOUNTER — Emergency Department (HOSPITAL_COMMUNITY)
Admission: EM | Admit: 2022-01-28 | Discharge: 2022-01-28 | Disposition: A | Discharge status: Home / Self Care | Payer: Medicare PPO | Source: Home / Self Care | Attending: Emergency Medicine | Admitting: Emergency Medicine

## 2022-01-28 ENCOUNTER — Emergency Department (HOSPITAL_COMMUNITY): Payer: Medicare PPO

## 2022-01-28 DIAGNOSIS — Z79899 Other long term (current) drug therapy: Secondary | ICD-10-CM | POA: Insufficient documentation

## 2022-01-28 DIAGNOSIS — R109 Unspecified abdominal pain: Secondary | ICD-10-CM | POA: Diagnosis not present

## 2022-01-28 DIAGNOSIS — Z7984 Long term (current) use of oral hypoglycemic drugs: Secondary | ICD-10-CM | POA: Insufficient documentation

## 2022-01-28 DIAGNOSIS — Z7982 Long term (current) use of aspirin: Secondary | ICD-10-CM | POA: Insufficient documentation

## 2022-01-28 DIAGNOSIS — Z7902 Long term (current) use of antithrombotics/antiplatelets: Secondary | ICD-10-CM | POA: Insufficient documentation

## 2022-01-28 DIAGNOSIS — I700 Atherosclerosis of aorta: Secondary | ICD-10-CM | POA: Diagnosis not present

## 2022-01-28 DIAGNOSIS — Z87891 Personal history of nicotine dependence: Secondary | ICD-10-CM | POA: Insufficient documentation

## 2022-01-28 DIAGNOSIS — R600 Localized edema: Secondary | ICD-10-CM | POA: Diagnosis not present

## 2022-01-28 DIAGNOSIS — I2510 Atherosclerotic heart disease of native coronary artery without angina pectoris: Secondary | ICD-10-CM | POA: Insufficient documentation

## 2022-01-28 DIAGNOSIS — K5900 Constipation, unspecified: Secondary | ICD-10-CM | POA: Insufficient documentation

## 2022-01-28 DIAGNOSIS — I10 Essential (primary) hypertension: Secondary | ICD-10-CM | POA: Insufficient documentation

## 2022-01-28 DIAGNOSIS — E119 Type 2 diabetes mellitus without complications: Secondary | ICD-10-CM | POA: Insufficient documentation

## 2022-01-28 MED ORDER — ONDANSETRON 4 MG PO TBDP
4.0000 mg | ORAL_TABLET | Freq: Once | ORAL | Status: AC
  Administered 2022-01-28: 4 mg via ORAL
  Filled 2022-01-28: qty 1

## 2022-01-28 MED ORDER — ONDANSETRON HCL 4 MG/2ML IJ SOLN: 4.0000 mg | Freq: Once | INTRAMUSCULAR | Status: DC

## 2022-01-28 NOTE — ED Triage Notes (Signed)
Pt c/o not having a bm x 5 days and states this am when he woke up his left calf was swollen

## 2022-01-28 NOTE — ED Provider Notes (Signed)
[facility] EMERGENCY DEPARTMENT Provider Note   CSN: [id number] Arrival date & time: 01/28/22  9675     History  Chief Complaint  Patient presents with   Constipation    Kevin [name] is a 76 y.o. male.  Patient here with complaint of abdominal pain nausea and some vomiting.  Patient states his abdomen is been bothering him for about a month but got worse in the last week.  Patient seen on September 19 here had CT scan of the abdomen without any acute findings.  Patient states he has not had a bowel movement for 5 days.  Patient has a history of volvulus and history of lysis of adhesions done by Dr. Constance Haw here on May 2023.  Patient also had gastric banding port revision in Sep 20, 2021.  Patient is a former smoker quit in 1996.  Other surgical history includes appendectomy coronary angioplasty with stent most recent cardiac catheterization 2016 history of hyperlipidemia history of cellulitis coronary artery disease hypertension diabetes.       Home Medications Prior to Admission medications   Medication Sig Start Date End Date Taking? Authorizing Provider  Ascorbic Acid (VITAMIN C) 1000 MG tablet Take 1,000 mg by mouth daily.    [provider]  aspirin 81 MG tablet Take 81 mg by mouth daily.    [provider]  clopidogrel (PLAVIX) 75 MG tablet TAKE ONE TABLET BY MOUTH ONCE DAILY. 01/27/22   Lorretta Harp, MD  Coenzyme Q10 (CO Q 10) 10 MG CAPS Take 1 capsule by mouth daily.    [provider]  colchicine 0.6 MG tablet Take 0.6 mg by mouth daily as needed. Patient not taking: Reported on 12/16/2021    [provider]  diphenhydrAMINE (BENADRYL) 25 MG tablet Take 25 mg by mouth every 6 (six) hours as needed.    [provider]  fluticasone (FLONASE) 50 MCG/ACT nasal spray Place 2 sprays into both nostrils daily.    [provider]  furosemide (LASIX) 40 MG tablet Take 40 mg by mouth daily.    [provider]   glipiZIDE (GLUCOTROL) 5 MG tablet Take 5 mg by mouth daily as needed.    [provider]  levothyroxine (SYNTHROID) 50 MCG tablet Take 50 mcg by mouth daily before breakfast.    [provider]  metoprolol (TOPROL-XL) 100 MG 24 hr tablet Take 100 mg by mouth daily.    [provider]  niacin (NIASPAN) 1000 MG CR tablet TAKE 1 TABLET BY MOUTH AT BEDTIME. 12/24/21   Lorretta Harp, MD  ondansetron (ZOFRAN) 4 MG tablet Take 1 tablet (4 mg total) by mouth every 6 (six) hours. 01/21/22   Triplett, Tammy, PA-C  pantoprazole (PROTONIX) 40 MG tablet Take 1 tablet (40 mg total) by mouth daily. 01/21/22   Triplett, Tammy, PA-C  pravastatin (PRAVACHOL) 40 MG tablet Take 1 tablet (40 mg total) by mouth every evening. 04/18/19   Lorretta Harp, MD  RESVERATROL PO Take 1 capsule by mouth every morning.     [provider]  sitaGLIPtin (JANUVIA) 100 MG tablet Take 100 mg by mouth daily.    [provider]  tamsulosin (FLOMAX) 0.4 MG CAPS capsule Take 0.4 mg by mouth daily. 07/17/20   [provider]  traMADol (ULTRAM) 50 MG tablet Take 1 tablet (50 mg total) by mouth every 6 (six) hours as needed for moderate pain. Patient not taking: Reported on 12/16/2021 09/23/21   Orson Eva, MD  valsartan (DIOVAN) 160 MG tablet Take 160 mg by mouth daily. Patient not taking: Reported on 12/16/2021    [provider]  valsartan-hydrochlorothiazide (DIOVAN-HCT) 160-12.5 MG tablet Take 1 tablet by mouth daily. 10/17/21   Warren Lacy, PA-C      Allergies    Altace [ramipril], Avapro [irbesartan], Prednisone, Shellfish allergy, Tradjenta [linagliptin], Cephalexin, Clindamycin hcl, Codeine, Contrast media [iodinated contrast media], Fenofibrate, Glycotrol [support-500], Indomethacin, Minocycline hcl, Morphine and related, Sulfa drugs cross reactors, Tetracyclines & related, and Tree extract    Review of Systems   Review of Systems  Constitutional:  Negative  for chills and fever.  HENT:  Negative for ear pain and sore throat.   Eyes:  Negative for pain and visual disturbance.  Respiratory:  Negative for cough and shortness of breath.   Cardiovascular:  Positive for leg swelling. Negative for chest pain and palpitations.  Gastrointestinal:  Positive for abdominal pain, constipation, nausea and vomiting.  Genitourinary:  Negative for dysuria and hematuria.  Musculoskeletal:  Negative for arthralgias and back pain.  Skin:  Negative for color change and rash.  Neurological:  Negative for seizures and syncope.  All other systems reviewed and are negative.   Physical Exam Updated Vital Signs BP 136/76   Pulse (!) 58   Temp 97.7 F (36.5 C) (Oral)   Resp 16   Ht 1.765 m (5' 9.5")   Wt 114.4 kg   SpO2 100%   BMI 36.71 kg/m  Physical Exam Vitals and nursing note reviewed.  Constitutional:      General: He is not in acute distress.    Appearance: Normal appearance. He is well-developed.  HENT:     Head: Normocephalic and atraumatic.  Eyes:     Extraocular Movements: Extraocular movements intact.     Conjunctiva/sclera: Conjunctivae normal.     Pupils: Pupils are equal, round, and reactive to light.  Cardiovascular:     Rate and Rhythm: Normal rate and regular rhythm.     Heart sounds: No murmur heard. Pulmonary:     Effort: Pulmonary effort is normal. No respiratory distress.     Breath sounds: Normal breath sounds.  Abdominal:     General: There is distension.     Palpations: Abdomen is soft.     Tenderness: There is abdominal tenderness. There is no guarding.  Musculoskeletal:        General: Swelling and tenderness present.     Cervical back: Neck supple.     Left lower leg: Edema present.     Comments: Mild swelling to both lower extremities left greater than right.  Some chronic erythema to the skin.  Of left calf with increased swelling and tenderness.  Skin:    General: Skin is warm and dry.     Capillary Refill:  Capillary refill takes less than 2 seconds.  Neurological:     General: No focal deficit present.     Mental Status: He is alert and oriented to person, place, and time.  Psychiatric:        Mood and Affect: Mood normal.     ED Results / Procedures / Treatments   Labs (all labs ordered are listed, but only abnormal results are displayed) Labs Reviewed - No data to display  EKG None  Radiology US Venous Img Lower  Left (DVT Study)  Result Date: 01/28/2022 CLINICAL DATA:  Calf swelling. EXAM: Left LOWER EXTREMITY VENOUS DOPPLER ULTRASOUND TECHNIQUE: Gray-scale sonography with compression, as well as color and duplex ultrasound,  were performed to evaluate the deep venous system(s) from the level of the common femoral vein through the popliteal and proximal calf veins. COMPARISON:  None Available. FINDINGS: VENOUS Normal compressibility of the common femoral, superficial femoral, and popliteal veins, as well as the visualized calf veins. Visualized portions of profunda femoral vein and great saphenous vein unremarkable. No filling defects to suggest DVT on grayscale or color Doppler imaging. Doppler waveforms show normal direction of venous flow, normal respiratory plasticity and response to augmentation. Limited views of the contralateral common femoral vein are unremarkable. OTHER Prominent subcutaneous soft tissue edema of the left calf. Limitations: none IMPRESSION: Negative. Electronically Signed   By: Keane Police D.O.   On: 01/28/2022 10:08   CT Abdomen Pelvis Wo Contrast  Result Date: 01/28/2022 CLINICAL DATA:  Acute right-sided abdominal pain, constipation. EXAM: CT ABDOMEN AND PELVIS WITHOUT CONTRAST TECHNIQUE: Multidetector CT imaging of the abdomen and pelvis was performed following the standard protocol without IV contrast. RADIATION DOSE REDUCTION: This exam was performed according to the departmental dose-optimization program which includes automated exposure control, adjustment of  the mA and/or kV according to patient size and/or use of iterative reconstruction technique. COMPARISON:  January 21, 2022. FINDINGS: Lower chest: No acute abnormality. Hepatobiliary: No focal liver abnormality is seen. No gallstones, gallbladder wall thickening, or biliary dilatation. Pancreas: Unremarkable. No pancreatic ductal dilatation or surrounding inflammatory changes. Spleen: Normal in size without focal abnormality. Adrenals/Urinary Tract: Adrenal glands are unremarkable. Kidneys are normal, without renal calculi, focal lesion, or hydronephrosis. Bladder is unremarkable. Stomach/Bowel: The stomach appears normal. There is no evidence of bowel obstruction or inflammation. Status post appendectomy. Large amount of stool seen throughout the colon, particularly in the rectum. Sigmoid diverticulosis is noted without inflammation. Vascular/Lymphatic: Aortic atherosclerosis. No enlarged abdominal or pelvic lymph nodes. Reproductive: Mild prostatic enlargement is noted. Penile implant reservoir is noted anteriorly in the pelvis. Other: No abdominal wall hernia or abnormality. No abdominopelvic ascites. Musculoskeletal: No acute or significant osseous findings. IMPRESSION: Large amount of stool seen throughout the colon, particularly in the rectum. There is no evidence of bowel obstruction or inflammation. Sigmoid diverticulosis without inflammation. Mild prostatic enlargement. Aortic Atherosclerosis (ICD10-I70.0). Electronically Signed   By: Marijo Conception M.D.   On: 01/28/2022 09:12    Procedures Procedures    Medications Ordered in ED Medications  ondansetron (ZOFRAN-ODT) disintegrating tablet 4 mg (4 mg Oral Given 01/28/22 0931)    ED Course/ Medical Decision Making/ A&P                           Medical Decision Making Amount and/or Complexity of Data Reviewed Labs: ordered. Radiology: ordered.  Risk Prescription drug management.   Patient had negative CT scan on 19 September.  But  states symptoms are worse so we will repeat.  Make sure no recurrent evidence of bowel obstruction or volvulus.  Patient has a contrast allergy so the CAT scan to be done without contrast.  Also will get Doppler studies of the left lower extremity to rule out DVT.  No evidence of deep vein thrombosis patient with bilateral leg swelling does have some increased erythema to the left leg.  Probably chronic changes there.  CT scan of the abdomen consistent with constipation no evidence of any bowel obstruction or any other acute abnormalities.  We will treat the patient with MiraLAX and have him follow-up with his gastroenterologist Dr. [name].   Final Clinical Impression(s) / ED Diagnoses  Final diagnoses:  Constipation, unspecified constipation type    Rx / DC Orders ED Discharge Orders     None         Fredia Sorrow, MD 01/28/22 1119

## 2022-01-28 NOTE — Discharge Instructions (Signed)
I would recommend MiraLAX 4 times a day to start and then drop it down to 2 times a day.  Available over-the-counter at the pharmacy and most supermarkets.  Also I would make an appointment to follow-up with Dr. [name] gastroenterology for further evaluation no evidence of any bowel obstruction here today.  No evidence of any blood clots in the leg.  Swelling in the legs is showing chronic changes.

## 2022-02-01 ENCOUNTER — Emergency Department (HOSPITAL_COMMUNITY)
Admission: EM | Admit: 2022-02-01 | Discharge: 2022-02-01 | Disposition: A | Discharge status: Home / Self Care | Payer: Medicare PPO | Attending: Emergency Medicine | Admitting: Emergency Medicine

## 2022-02-01 ENCOUNTER — Encounter (HOSPITAL_COMMUNITY): Payer: Self-pay

## 2022-02-01 ENCOUNTER — Other Ambulatory Visit: Payer: Self-pay

## 2022-02-01 ENCOUNTER — Emergency Department (HOSPITAL_COMMUNITY): Payer: Medicare PPO

## 2022-02-01 DIAGNOSIS — D649 Anemia, unspecified: Secondary | ICD-10-CM | POA: Insufficient documentation

## 2022-02-01 DIAGNOSIS — Z7982 Long term (current) use of aspirin: Secondary | ICD-10-CM | POA: Insufficient documentation

## 2022-02-01 DIAGNOSIS — I10 Essential (primary) hypertension: Secondary | ICD-10-CM | POA: Insufficient documentation

## 2022-02-01 DIAGNOSIS — K5900 Constipation, unspecified: Secondary | ICD-10-CM | POA: Insufficient documentation

## 2022-02-01 DIAGNOSIS — R1013 Epigastric pain: Secondary | ICD-10-CM | POA: Diagnosis present

## 2022-02-01 DIAGNOSIS — Z7901 Long term (current) use of anticoagulants: Secondary | ICD-10-CM | POA: Insufficient documentation

## 2022-02-01 LAB — CBC WITH DIFFERENTIAL/PLATELET
Abs Immature Granulocytes: 0.01 10*3/uL (ref 0.00–0.07)
Basophils Absolute: 0 10*3/uL (ref 0.0–0.1)
Basophils Relative: 1 %
Eosinophils Absolute: 0.2 10*3/uL (ref 0.0–0.5)
Eosinophils Relative: 6 %
HCT: 31.4 % — ABNORMAL LOW (ref 39.0–52.0)
Hemoglobin: 10.6 g/dL — ABNORMAL LOW (ref 13.0–17.0)
Immature Granulocytes: 0 %
Lymphocytes Relative: 23 %
Lymphs Abs: 1 10*3/uL (ref 0.7–4.0)
MCH: 31.8 pg (ref 26.0–34.0)
MCHC: 33.8 g/dL (ref 30.0–36.0)
MCV: 94.3 fL (ref 80.0–100.0)
Monocytes Absolute: 0.5 10*3/uL (ref 0.1–1.0)
Monocytes Relative: 12 %
Neutro Abs: 2.5 10*3/uL (ref 1.7–7.7)
Neutrophils Relative %: 58 %
Platelets: 158 10*3/uL (ref 150–400)
RBC: 3.33 MIL/uL — ABNORMAL LOW (ref 4.22–5.81)
RDW: 13.3 % (ref 11.5–15.5)
WBC: 4.3 10*3/uL (ref 4.0–10.5)
nRBC: 0 % (ref 0.0–0.2)

## 2022-02-01 LAB — COMPREHENSIVE METABOLIC PANEL
ALT: 17 U/L (ref 0–44)
AST: 17 U/L (ref 15–41)
Albumin: 4.1 g/dL (ref 3.5–5.0)
Alkaline Phosphatase: 41 U/L (ref 38–126)
Anion gap: 6 (ref 5–15)
BUN: 21 mg/dL (ref 8–23)
CO2: 28 mmol/L (ref 22–32)
Calcium: 9.7 mg/dL (ref 8.9–10.3)
Chloride: 101 mmol/L (ref 98–111)
Creatinine, Ser: 1.19 mg/dL (ref 0.61–1.24)
GFR, Estimated: >60 mL/min (ref >60)
Glucose, Bld: 149 mg/dL — ABNORMAL HIGH (ref 70–99)
Potassium: 3.7 mmol/L (ref 3.5–5.1)
Sodium: 135 mmol/L (ref 135–145)
Total Bilirubin: 0.6 mg/dL (ref 0.3–1.2)
Total Protein: 7.6 g/dL (ref 6.5–8.1)

## 2022-02-01 LAB — URINALYSIS, ROUTINE W REFLEX MICROSCOPIC
Bilirubin Urine: NEGATIVE
Glucose, UA: NEGATIVE mg/dL
Hgb urine dipstick: NEGATIVE
Ketones, ur: NEGATIVE mg/dL
Leukocytes,Ua: NEGATIVE
Nitrite: NEGATIVE
Protein, ur: NEGATIVE mg/dL
Specific Gravity, Urine: 1.006 (ref 1.005–1.030)
pH: 6 (ref 5.0–8.0)

## 2022-02-01 MED ORDER — DOCUSATE SODIUM 100 MG PO CAPS: 100.0000 mg | ORAL_CAPSULE | Freq: Two times a day (BID) | ORAL | 0 refills | Status: DC

## 2022-02-01 NOTE — ED Notes (Signed)
EDP at [facility] 

## 2022-02-01 NOTE — Discharge Instructions (Signed)
Get a bottle of magnesium citrate and drink half the bottle today.  If you do not have a bowel movement today then you can go ahead and drink the other half a bottle tomorrow.  Also wrote for stool softener for you to help regulate your stools.  This medicine will not affect your kidney function.  Follow-up with her doctor as needed

## 2022-02-01 NOTE — ED Notes (Signed)
Alert, NAD, calm, interactive, to xray via stretcher

## 2022-02-01 NOTE — ED Triage Notes (Signed)
Patient states that he is having continued abdominal pain with decreased bowel movements. Concerned that he has SBO however states this is third visit this week for same and told he does not have obstruction. Complaining of lower back pain.

## 2022-02-01 NOTE — ED Provider Notes (Signed)
[facility] EMERGENCY DEPARTMENT Provider Note   CSN: [id number] Arrival date & time: 02/01/22  3086     History {Add pertinent medical, surgical, social history, OB history to HPI:1} Chief Complaint  Patient presents with   Abdominal Pain    Kevin [name] is a 76 y.o. male.  Patient complains of abdominal pain.  He has a history of hypertension.  He has been here many times and is concerned about small bowel obstruction.  Patient is not vomiting   Abdominal Pain      Home Medications Prior to Admission medications   Medication Sig Start Date End Date Taking? Authorizing Provider  docusate sodium (COLACE) 100 MG capsule Take 1 capsule (100 mg total) by mouth every 12 (twelve) hours. 02/01/22  Yes Milton Ferguson, MD  Ascorbic Acid (VITAMIN C) 1000 MG tablet Take 1,000 mg by mouth daily.    [provider]  aspirin 81 MG tablet Take 81 mg by mouth daily.    [provider]  clopidogrel (PLAVIX) 75 MG tablet TAKE ONE TABLET BY MOUTH ONCE DAILY. 01/27/22   Lorretta Harp, MD  Coenzyme Q10 (CO Q 10) 10 MG CAPS Take 1 capsule by mouth daily.    [provider]  colchicine 0.6 MG tablet Take 0.6 mg by mouth daily as needed. Patient not taking: Reported on 12/16/2021    [provider]  diphenhydrAMINE (BENADRYL) 25 MG tablet Take 25 mg by mouth every 6 (six) hours as needed.    [provider]  fluticasone (FLONASE) 50 MCG/ACT nasal spray Place 2 sprays into both nostrils daily.    [provider]  furosemide (LASIX) 40 MG tablet Take 40 mg by mouth daily.    [provider]  glipiZIDE (GLUCOTROL) 5 MG tablet Take 5 mg by mouth daily as needed.    [provider]  levothyroxine (SYNTHROID) 50 MCG tablet Take 50 mcg by mouth daily before breakfast.    [provider]  metoprolol (TOPROL-XL) 100 MG 24 hr tablet Take 100 mg by mouth daily.    [provider]  niacin (NIASPAN) 1000 MG CR tablet  TAKE 1 TABLET BY MOUTH AT BEDTIME. 12/24/21   Lorretta Harp, MD  ondansetron (ZOFRAN) 4 MG tablet Take 1 tablet (4 mg total) by mouth every 6 (six) hours. 01/21/22   Triplett, Tammy, PA-C  pantoprazole (PROTONIX) 40 MG tablet Take 1 tablet (40 mg total) by mouth daily. 01/21/22   Triplett, Tammy, PA-C  pravastatin (PRAVACHOL) 40 MG tablet Take 1 tablet (40 mg total) by mouth every evening. 04/18/19   Lorretta Harp, MD  RESVERATROL PO Take 1 capsule by mouth every morning.     [provider]  sitaGLIPtin (JANUVIA) 100 MG tablet Take 100 mg by mouth daily.    [provider]  tamsulosin (FLOMAX) 0.4 MG CAPS capsule Take 0.4 mg by mouth daily. 07/17/20   [provider]  traMADol (ULTRAM) 50 MG tablet Take 1 tablet (50 mg total) by mouth every 6 (six) hours as needed for moderate pain. Patient not taking: Reported on 12/16/2021 09/23/21   Orson Eva, MD  valsartan (DIOVAN) 160 MG tablet Take 160 mg by mouth daily. Patient not taking: Reported on 12/16/2021    [provider]  valsartan-hydrochlorothiazide (DIOVAN-HCT) 160-12.5 MG tablet Take 1 tablet by mouth daily. 10/17/21   Warren Lacy, PA-C      Allergies    Altace [ramipril], Avapro [irbesartan], Prednisone, Shellfish allergy, Tradjenta [linagliptin],  Cephalexin, Clindamycin hcl, Codeine, Contrast media [iodinated contrast media], Fenofibrate, Glycotrol [support-500], Indomethacin, Minocycline hcl, Morphine and related, Sulfa drugs cross reactors, Tetracyclines & related, and Tree extract    Review of Systems   Review of Systems  Gastrointestinal:  Positive for abdominal pain.    Physical Exam Updated Vital Signs BP (!) 126/96   Pulse 61   Temp 97.9 F (36.6 C)   Resp (!) 24   Ht 5' 9.5" (1.765 m)   Wt 114.8 kg   SpO2 100%   BMI 36.83 kg/m  Physical Exam  ED Results / Procedures / Treatments   Labs (all labs ordered are listed, but only abnormal results are displayed) Labs Reviewed   CBC WITH DIFFERENTIAL/PLATELET - Abnormal; Notable for the following components:      Result Value   RBC 3.33 (*)    Hemoglobin 10.6 (*)    HCT 31.4 (*)    All other components within normal limits  COMPREHENSIVE METABOLIC PANEL - Abnormal; Notable for the following components:   Glucose, Bld 149 (*)    All other components within normal limits  URINALYSIS, ROUTINE W REFLEX MICROSCOPIC - Abnormal; Notable for the following components:   Color, Urine STRAW (*)    All other components within normal limits    EKG None  Radiology DG ABD ACUTE 2+V W 1V CHEST  Result Date: 02/01/2022 CLINICAL DATA:  Constipation.  Decreased bowel movements. EXAM: DG ABDOMEN ACUTE WITH 1 VIEW CHEST COMPARISON:  CT AP 01/28/2022 FINDINGS: Heart size and mediastinal contours are unremarkable. There is no pleural effusion or edema. No airspace opacities identified. Moderate stool burden identified throughout the colon. Desiccated stool identified within the rectum. Scattered air-filled loops of small bowel are noted which appears similar to previous CT. No pathologic dilatation of the large or small bowel loops. IMPRESSION: 1. Moderate stool burden throughout the colon compatible with constipation. Desiccated stool within the rectum. 2. No acute cardiopulmonary abnormalities. Electronically Signed   By: Kerby Moors M.D.   On: 02/01/2022 08:24    Procedures Procedures  {Document cardiac monitor, telemetry assessment procedure when appropriate:1}  Medications Ordered in ED Medications - No data to display  ED Course/ Medical Decision Making/ A&P                           Medical Decision Making Amount and/or Complexity of Data Reviewed Labs: ordered. Radiology: ordered.  Risk OTC drugs.   Patient with constipation.  He is given magnesium citrate and Colace and will follow-up with PCP  {Document critical care time when appropriate:1} {Document review of labs and clinical decision tools ie heart  score, Chads2Vasc2 etc:1}  {Document your independent review of radiology images, and any outside records:1} {Document your discussion with family members, caretakers, and with consultants:1} {Document social determinants of health affecting pt's care:1} {Document your decision making why or why not admission, treatments were needed:1} Final Clinical Impression(s) / ED Diagnoses Final diagnoses:  Constipation, unspecified constipation type    Rx / DC Orders ED Discharge Orders          Ordered    docusate sodium (COLACE) 100 MG capsule  Every 12 hours        02/01/22 1013

## 2022-02-03 DIAGNOSIS — R972 Elevated prostate specific antigen [PSA]: Secondary | ICD-10-CM | POA: Diagnosis not present

## 2022-02-04 ENCOUNTER — Encounter: Payer: Self-pay | Admitting: *Deleted

## 2022-02-04 ENCOUNTER — Telehealth: Payer: Self-pay | Admitting: Gastroenterology

## 2022-02-04 ENCOUNTER — Ambulatory Visit: Payer: Medicare PPO | Admitting: Gastroenterology

## 2022-02-04 ENCOUNTER — Encounter: Payer: Self-pay | Admitting: Gastroenterology

## 2022-02-04 VITALS — BP 163/80 | HR 72 | Temp 97.8°F | Ht 69.0 in | Wt 256.0 lb

## 2022-02-04 DIAGNOSIS — R1084 Generalized abdominal pain: Secondary | ICD-10-CM | POA: Diagnosis not present

## 2022-02-04 DIAGNOSIS — K562 Volvulus: Secondary | ICD-10-CM

## 2022-02-04 DIAGNOSIS — K439 Ventral hernia without obstruction or gangrene: Secondary | ICD-10-CM

## 2022-02-04 DIAGNOSIS — K5900 Constipation, unspecified: Secondary | ICD-10-CM

## 2022-02-04 DIAGNOSIS — Z8719 Personal history of other diseases of the digestive system: Secondary | ICD-10-CM | POA: Diagnosis not present

## 2022-02-04 DIAGNOSIS — Z86010 Personal history of colonic polyps: Secondary | ICD-10-CM

## 2022-02-04 DIAGNOSIS — K219 Gastro-esophageal reflux disease without esophagitis: Secondary | ICD-10-CM | POA: Diagnosis not present

## 2022-02-04 NOTE — Progress Notes (Unsigned)
GI Office Note    Referring Provider: Sharilyn Sites, MD Primary Care Physician:  Sharilyn Sites, MD Primary Gastroenterologist: Dr. [name]  Date:  02/04/2022  ID:  Kevin [name], DOB 1945-12-19, MRN [id number]   Chief Complaint   Chief Complaint  Patient presents with   Abdominal Pain    Stomach pains. Was having some constipation. Went to the ED was given magnesium citrate. That helped some. Dry heaves in the morning. Has a swollen area on the left side of stomach. No pain in the area.      History of Present Illness  Kevin [name] is a 76 y.o. male with a history of asthma, CAD s/p stents in 2009, diabetes, HTN, HLD, melanoma, microscopic colitis, OSA not using CPAP, Lap-Band surgery*** presenting today for follow-up to discuss scheduling TCS given recent surgery.  Last colonoscopy June 2012: Normal rectum, long redundant colon, pancolonic diverticula, 6 mm cecal polyp, diminutive polyp at the appendiceal orifice ablated with tip of hot snare, biopsies taken (biopsy suggestive of microscopic colitis; polyp benign; small bowel ok)  Last EGD June 2012: Normal esophagus, Lap-Band present, second portion of duodenum with erosions without frank ulcer  Last seen in the office 08/22/2021: No alarm symptoms other than some occasional audible abdominal sounds.  He had been to the ED with intermittent stomach ache and had an x-ray and told he had a possible small bowel obstruction and he rested his bowels for 2 weeks and then had resolution of symptoms.  He denies any chest pain or shortness of breath.  He reported he was following a consistent carb diet given he had a recently elevated A1c, and actually lost about 6 pounds in 1 week to 2 weeks.  He was admitted to the hospital 09/10/2021 - 09/23/2021 for abdominal pain with nausea and abdominal distention.  He was found to have a mid small bowel obstruction.  He was initially treated medically.  CT on 5/16 with ongoing small bowel dilation and  transition zone and noted twisting of the mesenteric structures with possibility of closed-loop obstruction secondary to malrotation.  He had exploratory laparotomy on 5/19 with reduction of volvulus, lysis of adhesions, removal of his gastric band.  He had follow-up regarding his hypertension with cardiology on 10/17/2021.  He complained of increased lower extremity edema and higher blood pressures since his hospitalization in May.  Also reported issues of itching with Plavix but after stopping it was unsure if it was actually the Plavix.  His HCTZ was stopped during his hospitalization due to gout but he was taking his Lasix 40 mg daily at home.  His Lasix was increased briefly to 80 mg daily for 5 days and he was advised to resume his HCTZ.  Advised to continue aspirin and statin therapy.   Follow up 11/07/21: still with some lower extremity edema, but lungs clear.  He was advised to increase his Lasix to 80 mg for 3 more days and then go back to 40 mg daily and keep a weight log.  He was instructed that if his weight increase by more than 3 pounds overnight or 5 pounds in a week and he is to take an extra dose of Lasix.  His blood pressure has been well controlled on combination of valsartan HCTZ medication.  Patient has had multiple ED visits since 9/19 due to abdominal pain and nausea.  He has had multiple CT scans of the abdomen with no evidence of bowel obstruction or other acute abnormality.  Changes are consistent with constipation.  CT A/P non contrast 01/28/22: IMPRESSION: Large amount of stool seen throughout the colon, particularly in the rectum. There is no evidence of bowel obstruction or inflammation. Sigmoid diverticulosis without inflammation.Mild prostatic enlargement. Aortic Atherosclerosis   Today: Reports he was doing fine from surgery until 9/19 and he said he was given miralax for constipation and said that made his kidney hurts. He then went back and was given a stool softener and  magnesium citrate and has been having bowel movements. Before this ED visit he had went a week without a bowel movement and every 2 days was normal for him prior to this. No melena or BRBPR. Took 1/2 bottle of magnesium citrate on Saturday and 1/2 bottle of Sunday. Went 5-6 times yesterday.   Started having some abdominal pain with burning into his mid abdomen. He picked up some famotidine and has been taking that every 12 hours and may be helping some with the pain and the burning is in his stomach. Got nauseas and had some dry heaves at that time. No coughing or belching. When he eats it gets better.   States that when he stands up there is an area that is poking in left lower abdomen. Not hard. But states that it looks weird and his concerning. Reports he has not seen general surgery since June. He states he tried to call and they told him he would need another referral to be seen.   Lower extremity edema is always there, sometimes it is better than other. Denies chest pain or shortness of breath. No dysphagia.   Current Outpatient Medications  Medication Sig Dispense Refill   clopidogrel (PLAVIX) 75 MG tablet TAKE ONE TABLET BY MOUTH ONCE DAILY. 90 tablet 0   colchicine 0.6 MG tablet Take 0.6 mg by mouth daily as needed.     fluticasone (FLONASE) 50 MCG/ACT nasal spray Place 2 sprays into both nostrils daily.     furosemide (LASIX) 40 MG tablet Take 40 mg by mouth daily.     glipiZIDE (GLUCOTROL) 5 MG tablet Take 5 mg by mouth daily as needed.     levothyroxine (SYNTHROID) 50 MCG tablet Take 50 mcg by mouth daily before breakfast.     metoprolol (TOPROL-XL) 100 MG 24 hr tablet Take 100 mg by mouth daily.     niacin (NIASPAN) 1000 MG CR tablet TAKE 1 TABLET BY MOUTH AT BEDTIME. 90 tablet 3   pravastatin (PRAVACHOL) 40 MG tablet Take 1 tablet (40 mg total) by mouth every evening. 90 tablet 3   tamsulosin (FLOMAX) 0.4 MG CAPS capsule Take 0.4 mg by mouth daily.     Ascorbic Acid (VITAMIN C) 1000  MG tablet Take 1,000 mg by mouth daily. (Patient not taking: Reported on 02/04/2022)     aspirin 81 MG tablet Take 81 mg by mouth daily. (Patient not taking: Reported on 02/04/2022)     Coenzyme Q10 (CO Q 10) 10 MG CAPS Take 1 capsule by mouth daily. (Patient not taking: Reported on 02/04/2022)     diphenhydrAMINE (BENADRYL) 25 MG tablet Take 25 mg by mouth every 6 (six) hours as needed. (Patient not taking: Reported on 02/04/2022)     docusate sodium (COLACE) 100 MG capsule Take 1 capsule (100 mg total) by mouth every 12 (twelve) hours. (Patient not taking: Reported on 02/04/2022) 60 capsule 0   ondansetron (ZOFRAN) 4 MG tablet Take 1 tablet (4 mg total) by mouth every 6 (six) hours. (Patient not taking:  Reported on 02/04/2022) 12 tablet 0   pantoprazole (PROTONIX) 40 MG tablet Take 1 tablet (40 mg total) by mouth daily. (Patient not taking: Reported on 02/04/2022) 20 tablet 0   RESVERATROL PO Take 1 capsule by mouth every morning.  (Patient not taking: Reported on 02/04/2022)     sitaGLIPtin (JANUVIA) 100 MG tablet Take 100 mg by mouth daily. (Patient not taking: Reported on 02/04/2022)     traMADol (ULTRAM) 50 MG tablet Take 1 tablet (50 mg total) by mouth every 6 (six) hours as needed for moderate pain. (Patient not taking: Reported on 12/16/2021) 10 tablet 0   valsartan (DIOVAN) 160 MG tablet Take 160 mg by mouth daily. (Patient not taking: Reported on 12/16/2021)     valsartan-hydrochlorothiazide (DIOVAN-HCT) 160-12.5 MG tablet Take 1 tablet by mouth daily. (Patient not taking: Reported on 02/04/2022) 90 tablet 3   No current facility-administered medications for this visit.    Past Medical History:  Diagnosis Date   Angio-edema    Asthma    as a child. no problems now.    CAD (coronary artery disease)    Cellulitis 5/11   left leg   Diabetes mellitus    Diverticulosis    Diverticulosis    HTN (hypertension)    Hyperlipidemia    Melanoma ([place]) 10/11   left foot   Melanoma of foot ([place])  06/02/2011   Microscopic colitis 10/09/10   Colonoscopy dr [name]   Obesity    OSA on CPAP 01/20/2014   Psoriasis    S/P colonoscopy 07/03/03    Dr [name]->hyperplastic rectal polyp   S/P endoscopy 10/09/10   lap band intact, duodenal erosions   Seasonal allergies    Sleep apnea    Urticaria     Past Surgical History:  Procedure Laterality Date   ABDOMINAL SURGERY     ADENOIDECTOMY     APPENDECTOMY     CARDIAC CATHETERIZATION N/A 02/14/2015   Procedure: Left Heart Cath and Coronary Angiography;  Surgeon: Jettie Booze, MD;  Location: [facility];  Service: Cardiovascular;  Laterality: N/A;   CARDIAC CATHETERIZATION N/A 02/14/2015   Procedure: Coronary Stent Intervention;  Surgeon: Jettie Booze, MD;  Location: [facility];  Service: Cardiovascular;  Laterality: N/A;   COLONOSCOPY N/A 10/09/2010   normal rectum, long redundant colon, pancolonic diverticula, 37m cecal polyp, diminutive polyp at the appendiceal orifice ablated with tip of hot snare (Biopsies suggestive of microscopic colitis; polyp benign; small bowel ok)   CORONARY ANGIOPLASTY  2002/2005/2008   CORONARY ANGIOPLASTY WITH STENT PLACEMENT  06/11/2007   PTCA & stenting distal CX, PTCA & balloon angioplasty in-stent restenotic mid CX coronary artery stent   ESOPHAGOGASTRODUODENOSCOPY N/A 10/09/2010   Normal esophagus, lap band present, 2nd portion duodenum with erosions without frank ulcer   GASTRIC BANDING PORT REVISION N/A 09/20/2021   Procedure: GASTRIC BAND REMOVAL;  Surgeon: BVirl Cagey MD;  Location: [facility];  Service: General;  Laterality: N/A;   KNEE SURGERY     left   LAPAROTOMY N/A 09/20/2021   Procedure: EXPLORATORY LAPAROTOMY,  LYSIS OF ADHESIONS, REDUCTION OF VOLVULUS;  Surgeon: BVirl Cagey MD;  Location: [facility];  Service: General;  Laterality: N/A;   LEFT HEART CATHETERIZATION WITH CORONARY ANGIOGRAM N/A 07/15/2013   Procedure: LEFT HEART CATHETERIZATION WITH CORONARY  ANGIOGRAM;  Surgeon: HSinclair Grooms MD;  [facility];  Service: Cardiovascular;  Laterality: N/A;   NM MYOCAR PERF WALL MOTION  06/24/2011  small mild fixed anteroseptal & anteroapical defects.   PENILE PROSTHESIS IMPLANT     SHOULDER SURGERY     left   SINOSCOPY     TONSILLECTOMY     TYMPANOSTOMY TUBE PLACEMENT      Family History  Problem Relation Age of Onset   Coronary artery disease Mother    Diabetes Father    Liver cancer Brother 52   Celiac disease Other 73       brother's daughter   Allergic rhinitis Neg Hx    Angioedema Neg Hx    Asthma Neg Hx    Atopy Neg Hx    Eczema Neg Hx    Immunodeficiency Neg Hx    Urticaria Neg Hx     Allergies as of 02/04/2022 - Review Complete 02/04/2022  Allergen Reaction Noted   Altace [ramipril] Shortness Of Breath 07/15/2013   Avapro [irbesartan] Shortness Of Breath 10/01/2010   Prednisone Anaphylaxis, Swelling, and Other (See Comments) 10/01/2010   Shellfish allergy Anaphylaxis 07/15/2013   Tradjenta [linagliptin] Shortness Of Breath 09/02/2013   Cephalexin Hives and Swelling 10/01/2010   Clindamycin hcl Hives and Swelling 10/01/2010   Codeine Nausea And Vomiting and Other (See Comments) 10/01/2010   Contrast media [iodinated contrast media]  11/15/2018   Fenofibrate Other (See Comments) 07/15/2013   Glycotrol [support-500] Other (See Comments) 07/15/2013   Indomethacin Other (See Comments) 10/01/2010   Minocycline hcl Hives and Swelling 10/01/2010   Morphine and related Nausea And Vomiting 10/01/2010   Sulfa drugs cross reactors Hives and Swelling 10/01/2010   Tetracyclines & related  10/01/2010   Tree extract Itching 11/15/2018    Social History   Socioeconomic History   Marital status: Divorced    Spouse name: Not on file   Number of children: 1   Years of education: Not on file   Highest education level: Not on file  Occupational History   Occupation: Leisure centre manager: [employer]     Comment: [place]  Tobacco Use   Smoking status: Former    Packs/day: 3.00    Years: 30.00    Total pack years: 90.00    Types: Cigarettes    Start date: 11/18/1964    Quit date: 10/04/1994    Years since quitting: 27.3   Smokeless tobacco: Never   Tobacco comments:    quit smoking 19 years ago  Vaping Use   Vaping Use: Never used  Substance and Sexual Activity   Alcohol use: Yes    Alcohol/week: 0.0 standard drinks of alcohol    Comment: 3-4 times/year   Drug use: No   Sexual activity: Not Currently  Other Topics Concern   Not on file  Social History Narrative   Not on file   Social Determinants of Health   Financial Resource Strain: Not on file  Food Insecurity: Not on file  Transportation Needs: Not on file  Physical Activity: Not on file  Stress: Not on file  Social Connections: Not on file     Review of Systems   Gen: Denies fever, chills, anorexia. Denies fatigue, weakness, weight loss.  CV: Denies chest pain, palpitations, syncope, peripheral edema, and claudication. Resp: Denies dyspnea at rest, cough, wheezing, coughing up blood, and pleurisy. GI: See HPI Derm: Denies rash, itching, dry skin Psych: Denies depression, anxiety, memory loss, confusion. No homicidal or suicidal ideation.  Heme: Denies bruising, bleeding, and enlarged lymph nodes.   Physical Exam   BP (!) 163/80 (BP Location: Left  Arm, Patient Position: Sitting, Cuff Size: Large)   Pulse 72   Temp 97.8 F (36.6 C) (Temporal)   Ht '5\' 9"'$  (1.753 m)   Wt 256 lb (116.1 kg)   SpO2 98%   BMI 37.80 kg/m   General:   Alert and oriented. No distress noted. Pleasant and cooperative.  Head:  Normocephalic and atraumatic. Eyes:  Conjuctiva clear without scleral icterus. Mouth:  Oral mucosa pink and moist. Good dentition. No lesions. Lungs:  Clear to auscultation bilaterally. No wheezes, rales, or rhonchi. No distress.  Heart:  S1, S2 present without murmurs appreciated.  Abdomen:  +BS, soft,  non-tender and non-distended. No rebound or guarding. No HSM or masses noted. Rectal: *** Msk:  Symmetrical without gross deformities. Normal posture. Extremities:  Without edema. Neurologic:  Alert and  oriented x4 Psych:  Alert and cooperative. Normal mood and affect.   Assessment  Kevin [name] is a 76 y.o. male with a history of history of asthma, CAD s/p stents in 2009, diabetes, HTN, HLD, melanoma, microscopic colitis, OSA not using CPAP, Lap-Band surgery, and recent SBO with volvulus reduction and removal of lap band in May 2023*** presenting today to discuss scheduling TCS given recent surgery.  History of volvulus/SBO:   GERD:   Constipation:   Screening for colon cancer:    PLAN   *** Continue famotidine 20 mg twice daily. Continue Colace daily, may increase to twice daily if needed. Proceed with upper endoscopy and colonoscopy with propofol by Dr. [name]  in near future: the risks, benefits, and alternatives have been discussed with the patient in detail. The patient states understanding and desires to proceed. ASA 3 Trilyte prep or other PEG based prep.  Hold januvia and glipizide the morning of procedure Hold plavix 5 days, cardiology clearance.  Follow up 2 months post procedure.     Venetia Night, MSN, FNP-BC, AGACNP-BC [facility]

## 2022-02-04 NOTE — Telephone Encounter (Signed)
Please send referral to general surgery for patient regarding ventral hernia status post recent exploratory laparotomy for volvulus reduction.  Venetia Night, MSN, APRN, FNP-BC, AGACNP-BC [facility]

## 2022-02-04 NOTE — Telephone Encounter (Signed)
Referral placed.

## 2022-02-04 NOTE — Patient Instructions (Addendum)
For your acid reflux/burning in your stomach: For now GI continue famotidine 20 mg twice daily. Follow a GERD diet:  Avoid fried, fatty, greasy, spicy, citrus foods. Avoid caffeine and carbonated beverages. Avoid chocolate. Try eating 4-6 small meals a day rather than 3 large meals. Do not eat within 3 hours of laying down. Prop head of bed up on wood or bricks to create a 6 inch incline.  For constipation: On due to continue taking Colace once daily.  You may increase to twice daily if needed. Please do not use magnesium citrate regularly. If you decide you want to pursue prescription medication therapy for your constipation please let me know. I am 1 to provide you some samples of Linzess to take daily for days prior to your colonoscopy.  We are going to schedule you for an upper endoscopy and colonoscopy in the near future with Dr. [name] to screen for colon cancer and to assess your stomach burning/acid reflux given your history of lap band.  We will contact you to get you scheduled.  I will have you follow-up about 2 months after your procedure.  If you have ongoing issues with constipation prior to procedure and need a sooner appointment please contact the office for an appointment.  It was a pleasure to see you today. I want to create trusting relationships with patients. If you receive a survey regarding your visit,  I greatly appreciate you taking time to fill this out on paper or through your MyChart. I value your feedback.  Venetia Night, MSN, FNP-BC, AGACNP-BC [facility]

## 2022-02-05 ENCOUNTER — Telehealth: Payer: Self-pay | Admitting: *Deleted

## 2022-02-05 DIAGNOSIS — M25561 Pain in right knee: Secondary | ICD-10-CM | POA: Diagnosis not present

## 2022-02-05 DIAGNOSIS — M25562 Pain in left knee: Secondary | ICD-10-CM | POA: Diagnosis not present

## 2022-02-05 NOTE — Telephone Encounter (Signed)
Pt agreeable to tele pre op appt 02/11/22 @ 3 pm. Med rec and consent are done.

## 2022-02-05 NOTE — Telephone Encounter (Signed)
Primary Cardiologist:Jonathan [name], MD   Preoperative team, please contact this patient and set up a phone call appointment for further preoperative risk assessment. Please obtain consent and complete medication review. Thank you for your help.   Previously held Plavix for procedure 08/2020 and no record of coronary intervention since that time. If no symptoms of ACS, per office protocol, he may hold Plavix for 5 days prior to procedure and should resume post-operatively as soon as hemodynamically stable.    Emmaline Life, NP-C  02/05/2022, 11:46 AM [address] Office [phone] Fax [phone]

## 2022-02-05 NOTE — Telephone Encounter (Signed)
Pt agreeable to tele pre op appt 02/11/22 @ 3 pm. Med rec and consent are done.      Patient Consent for Virtual Visit        Kevin [name] has provided verbal consent on 02/05/2022 for a virtual visit (video or telephone).   CONSENT FOR VIRTUAL VISIT FOR:  Kevin [name]  By participating in this virtual visit I agree to the following:  I hereby voluntarily request, consent and authorize [facility] and its employed or contracted physicians, physician assistants, nurse practitioners or other licensed health care professionals (the Practitioner), to provide me with telemedicine health care services (the "Services") as deemed necessary by the treating Practitioner. I acknowledge and consent to receive the Services by the Practitioner via telemedicine. I understand that the telemedicine visit will involve communicating with the Practitioner through live audiovisual communication technology and the disclosure of certain medical information by electronic transmission. I acknowledge that I have been given the opportunity to request an in-person assessment or other available alternative prior to the telemedicine visit and am voluntarily participating in the telemedicine visit.  I understand that I have the right to withhold or withdraw my consent to the use of telemedicine in the course of my care at any time, without affecting my right to future care or treatment, and that the Practitioner or I may terminate the telemedicine visit at any time. I understand that I have the right to inspect all information obtained and/or recorded in the course of the telemedicine visit and may receive copies of available information for a reasonable fee.  I understand that some of the potential risks of receiving the Services via telemedicine include:  Delay or interruption in medical evaluation due to technological equipment failure or disruption; Information transmitted may not be sufficient (e.g. poor  resolution of images) to allow for appropriate medical decision making by the Practitioner; and/or  In rare instances, security protocols could fail, causing a breach of personal health information.  Furthermore, I acknowledge that it is my responsibility to provide information about my medical history, conditions and care that is complete and accurate to the best of my ability. I acknowledge that Practitioner's advice, recommendations, and/or decision may be based on factors not within their control, such as incomplete or inaccurate data provided by me or distortions of diagnostic images or specimens that may result from electronic transmissions. I understand that the practice of medicine is not an exact science and that Practitioner makes no warranties or guarantees regarding treatment outcomes. I acknowledge that a copy of this consent can be made available to me via my patient portal ([facility]), or I can request a printed copy by calling the office of [facility].    I understand that my insurance will be billed for this visit.   I have read or had this consent read to me. I understand the contents of this consent, which adequately explains the benefits and risks of the Services being provided via telemedicine.  I have been provided ample opportunity to ask questions regarding this consent and the Services and have had my questions answered to my satisfaction. I give my informed consent for the services to be provided through the use of telemedicine in my medical care

## 2022-02-05 NOTE — Telephone Encounter (Signed)
What type of surgery is being performed? Colonoscopy/EGD  When is surgery scheduled? TBD  Clearance to hold plavix 5 days before procedure  Name of physician performing surgery?  Dr. [facility] Phone: [phone] Fax: [phone]  Anethesia type (none, local, MAC, general)? MAC

## 2022-02-10 DIAGNOSIS — R3914 Feeling of incomplete bladder emptying: Secondary | ICD-10-CM | POA: Diagnosis not present

## 2022-02-10 DIAGNOSIS — R972 Elevated prostate specific antigen [PSA]: Secondary | ICD-10-CM | POA: Diagnosis not present

## 2022-02-10 DIAGNOSIS — N401 Enlarged prostate with lower urinary tract symptoms: Secondary | ICD-10-CM | POA: Diagnosis not present

## 2022-02-10 NOTE — Progress Notes (Unsigned)
Virtual Visit via Telephone Note   Because of Kevin [name]'s co-morbid illnesses, he is at least at moderate risk for complications without adequate follow up.  This format is felt to be most appropriate for this patient at this time.  The patient did not have access to video technology/had technical difficulties with video requiring transitioning to audio format only (telephone).  All issues noted in this document were discussed and addressed.  No physical exam could be performed with this format.  Please refer to the patient's chart for his consent to telehealth for [facility].  Evaluation Performed:  Preoperative cardiovascular risk assessment _____________   Date:  02/10/2022   Patient ID:  Kevin [name], DOB [birth year], MRN [id number] Patient Location:  Home Provider location:   Office  Primary Care Provider:  Sharilyn Sites, MD Primary Cardiologist:  Quay Burow, MD  Chief Complaint / Patient Profile   76 y.o. y/o male with a h/o CAD s/p remote LAD and LCx stenting in 2001 and 2004 respectively with repeat cardiac catheterization 2016 with diffuse ISR in LAD stent and 95% distal edge stenosis that was treated with DES, ostial diagonal lesion that was jailed and 75% OM1 lesion as well as occluded nondominant RCA, remote tobacco use, HTN, HLD, type 2 diabetes, OSA, obesity s/p lap band surgery 2011 who is pending colonoscopy/EGD and presents today for telephonic preoperative cardiovascular risk assessment.  Past Medical History    Past Medical History:  Diagnosis Date   Angio-edema    Asthma    as a child. no problems now.    CAD (coronary artery disease)    Cellulitis 5/11   left leg   Diabetes mellitus    Diverticulosis    Diverticulosis    HTN (hypertension)    Hyperlipidemia    Melanoma ([place]) 10/11   left foot   Melanoma of foot ([place]) 06/02/2011   Microscopic colitis 10/09/10   Colonoscopy dr [name]   Obesity    OSA on CPAP 01/20/2014   Psoriasis    S/P  colonoscopy 07/03/03    Dr [name]->hyperplastic rectal polyp   S/P endoscopy 10/09/10   lap band intact, duodenal erosions   Seasonal allergies    Sleep apnea    Urticaria    Past Surgical History:  Procedure Laterality Date   ABDOMINAL SURGERY     ADENOIDECTOMY     APPENDECTOMY     CARDIAC CATHETERIZATION N/A 02/14/2015   Procedure: Left Heart Cath and Coronary Angiography;  Surgeon: Jettie Booze, MD;  Location: [facility];  Service: Cardiovascular;  Laterality: N/A;   CARDIAC CATHETERIZATION N/A 02/14/2015   Procedure: Coronary Stent Intervention;  Surgeon: Jettie Booze, MD;  Location: [facility];  Service: Cardiovascular;  Laterality: N/A;   COLONOSCOPY N/A 10/09/2010   normal rectum, long redundant colon, pancolonic diverticula, 72m cecal polyp, diminutive polyp at the appendiceal orifice ablated with tip of hot snare (Biopsies suggestive of microscopic colitis; polyp benign; small bowel ok)   CORONARY ANGIOPLASTY  2002/2005/2008   CORONARY ANGIOPLASTY WITH STENT PLACEMENT  06/11/2007   PTCA & stenting distal CX, PTCA & balloon angioplasty in-stent restenotic mid CX coronary artery stent   ESOPHAGOGASTRODUODENOSCOPY N/A 10/09/2010   Normal esophagus, lap band present, 2nd portion duodenum with erosions without frank ulcer   GASTRIC BANDING PORT REVISION N/A 09/20/2021   Procedure: GASTRIC BAND REMOVAL;  Surgeon: BVirl Cagey MD;  Location: [facility];  Service: General;  Laterality: N/A;  KNEE SURGERY     left   LAPAROTOMY N/A 09/20/2021   Procedure: EXPLORATORY LAPAROTOMY,  LYSIS OF ADHESIONS, REDUCTION OF VOLVULUS;  Surgeon: Virl Cagey, MD;  Location: [facility];  Service: General;  Laterality: N/A;   LEFT HEART CATHETERIZATION WITH CORONARY ANGIOGRAM N/A 07/15/2013   Procedure: LEFT HEART CATHETERIZATION WITH CORONARY ANGIOGRAM;  Surgeon: Sinclair Grooms, MD;  Location: [facility];  Service: Cardiovascular;  Laterality: N/A;   NM MYOCAR PERF  WALL MOTION  06/24/2011   small mild fixed anteroseptal & anteroapical defects.   PENILE PROSTHESIS IMPLANT     SHOULDER SURGERY     left   SINOSCOPY     TONSILLECTOMY     TYMPANOSTOMY TUBE PLACEMENT      Allergies  Allergies  Allergen Reactions   Altace [Ramipril] Shortness Of Breath   Avapro [Irbesartan] Shortness Of Breath   Prednisone Anaphylaxis, Swelling and Other (See Comments)    SWELLING OF THE LIPS   Shellfish Allergy Anaphylaxis    Shrimp, selfish   Tradjenta [Linagliptin] Shortness Of Breath   Cephalexin Hives and Swelling   Clindamycin Hcl Hives and Swelling   Codeine Nausea And Vomiting and Other (See Comments)    AFFECTS HEART   Contrast Media [Iodinated Contrast Media]     Can be administered if Benadryl is administered prior to prevent reaction    Fenofibrate Other (See Comments)    "My skin started peeling off"   Glycotrol [Support-500] Other (See Comments)    unknown   Indomethacin Other (See Comments)    UNKNOWN REACTION   Minocycline Hcl Hives and Swelling   Morphine And Related Nausea And Vomiting    PROJECTILE VOMITING   Sulfa Drugs Cross Reactors Hives and Swelling   Tetracyclines & Related    Tree Extract Itching    History of Present Illness    Kevin [name] is a 76 y.o. male who presents via audio/video conferencing for a telehealth visit today.  Pt was last seen in cardiology clinic on 11/07/21 by Almyra Deforest, PA.  At that time Kevin [name] was having leg edema s/p recent admission for exploratory laparotomy. He was near baseline and advised to continue higher dose Lasix for 3 more days then return to regular daily dose.  The patient is now pending procedure as outlined above. Since his last visit, he *** denies chest pain, shortness of breath, lower extremity edema, fatigue, palpitations, melena, hematuria, hemoptysis, diaphoresis, weakness, presyncope, syncope, orthopnea, and PND.    Home Medications    Prior to Admission medications    Medication Sig Start Date End Date Taking? Authorizing Provider  Ascorbic Acid (VITAMIN C) 1000 MG tablet Take 1,000 mg by mouth daily.    [provider]  aspirin 81 MG tablet Take 81 mg by mouth daily.    [provider]  clopidogrel (PLAVIX) 75 MG tablet TAKE ONE TABLET BY MOUTH ONCE DAILY. 01/27/22   Lorretta Harp, MD  Coenzyme Q10 (CO Q 10) 10 MG CAPS Take 1 capsule by mouth daily. Patient not taking: Reported on 02/04/2022    [provider]  colchicine 0.6 MG tablet Take 0.6 mg by mouth daily as needed. Patient not taking: Reported on 02/05/2022    [provider]  diphenhydrAMINE (BENADRYL) 25 MG tablet Take 25 mg by mouth every 6 (six) hours as needed. Patient not taking: Reported on 02/04/2022    [provider]  docusate sodium (COLACE) 100 MG capsule Take 1  capsule (100 mg total) by mouth every 12 (twelve) hours. 02/01/22   Milton Ferguson, MD  fluticasone (FLONASE) 50 MCG/ACT nasal spray Place 2 sprays into both nostrils daily.    [provider]  furosemide (LASIX) 40 MG tablet Take 40 mg by mouth daily.    [provider]  glipiZIDE (GLUCOTROL) 5 MG tablet Take 5 mg by mouth daily as needed.    [provider]  levothyroxine (SYNTHROID) 50 MCG tablet Take 50 mcg by mouth daily before breakfast.    [provider]  metoprolol (TOPROL-XL) 100 MG 24 hr tablet Take 100 mg by mouth daily.    [provider]  niacin (NIASPAN) 1000 MG CR tablet TAKE 1 TABLET BY MOUTH AT BEDTIME. 12/24/21   Lorretta Harp, MD  ondansetron (ZOFRAN) 4 MG tablet Take 1 tablet (4 mg total) by mouth every 6 (six) hours. Patient not taking: Reported on 02/04/2022 01/21/22   Triplett, Tammy, PA-C  pravastatin (PRAVACHOL) 40 MG tablet Take 1 tablet (40 mg total) by mouth every evening. 04/18/19   Lorretta Harp, MD  RESVERATROL PO Take 1 capsule by mouth every morning.  Patient not taking: Reported on 02/04/2022     [provider]  tamsulosin (FLOMAX) 0.4 MG CAPS capsule Take 0.4 mg by mouth daily. 07/17/20   [provider]  traMADol (ULTRAM) 50 MG tablet Take 1 tablet (50 mg total) by mouth every 6 (six) hours as needed for moderate pain. Patient not taking: Reported on 12/16/2021 09/23/21   Orson Eva, MD  valsartan-hydrochlorothiazide (DIOVAN-HCT) 160-12.5 MG tablet Take 1 tablet by mouth daily. 10/17/21   Warren Lacy, PA-C    Physical Exam    Vital Signs:  Kevin [name] does not have vital signs available for review today.***  Given telephonic nature of communication, physical exam is limited. AAOx3. NAD. Normal affect.  Speech and respirations are unlabored.  Accessory Clinical Findings    None  Assessment & Plan    1.  Preoperative Cardiovascular Risk Assessment:  The patient was advised that if he develops new symptoms prior to surgery to contact our office to arrange for a follow-up visit, and he verbalized understanding.  Previously held Plavix for procedure 08/2020 and no record of coronary intervention since that time. If no symptoms of ACS, per office protocol, he may hold Plavix for 5 days prior to procedure and should resume post-operatively as soon as hemodynamically stable.   A copy of this note will be routed to requesting surgeon.  Time:   Today, I have spent *** minutes with the patient with telehealth technology discussing medical history, symptoms, and management plan.     Emmaline Life, NP  02/10/2022, 8:47 AM

## 2022-02-11 ENCOUNTER — Ambulatory Visit: Payer: Medicare PPO | Attending: Cardiology | Admitting: Nurse Practitioner

## 2022-02-11 ENCOUNTER — Encounter: Payer: Self-pay | Admitting: Nurse Practitioner

## 2022-02-11 DIAGNOSIS — Z01810 Encounter for preprocedural cardiovascular examination: Secondary | ICD-10-CM | POA: Diagnosis not present

## 2022-02-12 ENCOUNTER — Ambulatory Visit: Payer: Medicare PPO | Admitting: Nutrition

## 2022-02-12 ENCOUNTER — Encounter: Payer: Self-pay | Admitting: *Deleted

## 2022-02-12 ENCOUNTER — Telehealth: Payer: Self-pay | Admitting: *Deleted

## 2022-02-12 MED ORDER — PEG 3350-KCL-NA BICARB-NACL 420 G PO SOLR: 4000.0000 mL | Freq: Once | ORAL | 0 refills | Status: AC

## 2022-02-12 NOTE — Telephone Encounter (Signed)
PA approved via cohere. Authorization #[id number], DOS: 03/05/2022 - 05/04/2022

## 2022-02-12 NOTE — Telephone Encounter (Signed)
Spoke with pt to scheduled TCS/EGD with Dr. [name], ASA 3. He said to hold 11/29 for him and he will see if his parents can take him this day. He will call back to confirm this date.   Per encounter form will need trilyte, do not take januvia/glipizide morning of procedure, needs to take linzess 4 days before procedure. Does not need to hold plavix since he is scheduling with Dr. [name].

## 2022-02-12 NOTE — Telephone Encounter (Signed)
Pt called back and confirmed appt. Advised instructions and pre-op date will be mailed. Verbalized understanding.

## 2022-02-13 ENCOUNTER — Encounter: Payer: Self-pay | Admitting: General Surgery

## 2022-02-13 ENCOUNTER — Ambulatory Visit: Payer: Medicare PPO | Admitting: General Surgery

## 2022-02-13 VITALS — BP 169/76 | HR 66 | Temp 98.0°F | Resp 16 | Ht 69.0 in | Wt 257.0 lb

## 2022-02-13 DIAGNOSIS — M6208 Separation of muscle (nontraumatic), other site: Secondary | ICD-10-CM | POA: Insufficient documentation

## 2022-02-13 NOTE — Progress Notes (Signed)
[facility] Note   HPI:  Patient known to me after gastric band removal and reduction of volvulus. He had been seen in the ED a few times a while back and had a CT scan. He was worried he had developed a hernia after that visit. He went to the ED due to concern for a SBO. He had no SBO and no signs of any volvulus.  Review of Systems:  Having Bms No nausea/vomiting  All other review of systems: otherwise negative   Vital Signs:  BP (!) 169/76   Pulse 66   Temp 98 F (36.7 C) (Oral)   Resp 16   Ht '5\' 9"'$  (1.753 m)   Wt 257 lb (116.6 kg)   SpO2 95%   BMI 37.95 kg/m    Physical Exam:  Physical Exam Constitutional:      Appearance: He is obese.  HENT:     Head: Normocephalic.     Nose: Nose normal.  Pulmonary:     Effort: Pulmonary effort is normal.     Breath sounds: Normal breath sounds.  Abdominal:     General: There is no distension.     Palpations: Abdomen is soft.     Tenderness: There is no abdominal tenderness.     Hernia: No hernia is present.     Comments: Diastasis recti in the upper midline      Imaging: Personally reviewed- no hernia defect noted, very attenuated fascia and getting thin, diastasis recti 4-5cm  CLINICAL DATA:  Acute right-sided abdominal pain, constipation.   EXAM: CT ABDOMEN AND PELVIS WITHOUT CONTRAST   TECHNIQUE: Multidetector CT imaging of the abdomen and pelvis was performed following the standard protocol without IV contrast.   RADIATION DOSE REDUCTION: This exam was performed according to the departmental dose-optimization program which includes automated exposure control, adjustment of the mA and/or kV according to patient size and/or use of iterative reconstruction technique.   COMPARISON:  January 21, 2022.   FINDINGS: Lower chest: No acute abnormality.   Hepatobiliary: No focal liver abnormality is seen. No gallstones, gallbladder wall thickening, or biliary dilatation.   Pancreas: Unremarkable. No  pancreatic ductal dilatation or surrounding inflammatory changes.   Spleen: Normal in size without focal abnormality.   Adrenals/Urinary Tract: Adrenal glands are unremarkable. Kidneys are normal, without renal calculi, focal lesion, or hydronephrosis. Bladder is unremarkable.   Stomach/Bowel: The stomach appears normal. There is no evidence of bowel obstruction or inflammation. Status post appendectomy. Large amount of stool seen throughout the colon, particularly in the rectum. Sigmoid diverticulosis is noted without inflammation.   Vascular/Lymphatic: Aortic atherosclerosis. No enlarged abdominal or pelvic lymph nodes.   Reproductive: Mild prostatic enlargement is noted. Penile implant reservoir is noted anteriorly in the pelvis.   Other: No abdominal wall hernia or abnormality. No abdominopelvic ascites.   Musculoskeletal: No acute or significant osseous findings.   IMPRESSION: Large amount of stool seen throughout the colon, particularly in the rectum. There is no evidence of bowel obstruction or inflammation.   Sigmoid diverticulosis without inflammation.   Mild prostatic enlargement.   Aortic Atherosclerosis (ICD10-I70.0).     Electronically Signed   By: Marijo Conception M.D.   On: 01/28/2022 09:12   Assessment:  76 y.o. yo Male with diastasis recti and no obvious hernia on the CT scan or on exam. I think he is at risk for progressing to a hernia defect due to the thinness of the fascia and his abdomen subcutaneous wall.  Plan:  Monitor areas. If you notice worsening bulge or pain call us and we can consider hernia repair given that the area is so thin on CT it could develop into a hernia.   PRN follow up    Curlene Labrum, MD [facility] [address] [place], [place] [address] [phone] (office)

## 2022-02-13 NOTE — Patient Instructions (Signed)
Monitor areas. If you notice worsening bulge or pain call us and we can consider hernia repair given that the area is so thin on CT it could develop into a hernia.

## 2022-02-14 ENCOUNTER — Ambulatory Visit: Payer: Medicare PPO | Admitting: Gastroenterology

## 2022-03-05 DIAGNOSIS — D225 Melanocytic nevi of trunk: Secondary | ICD-10-CM | POA: Diagnosis not present

## 2022-03-05 DIAGNOSIS — D2372 Other benign neoplasm of skin of left lower limb, including hip: Secondary | ICD-10-CM | POA: Diagnosis not present

## 2022-03-05 DIAGNOSIS — D485 Neoplasm of uncertain behavior of skin: Secondary | ICD-10-CM | POA: Diagnosis not present

## 2022-03-05 DIAGNOSIS — Z08 Encounter for follow-up examination after completed treatment for malignant neoplasm: Secondary | ICD-10-CM | POA: Diagnosis not present

## 2022-03-05 DIAGNOSIS — Z85820 Personal history of malignant melanoma of skin: Secondary | ICD-10-CM | POA: Diagnosis not present

## 2022-03-05 DIAGNOSIS — Z1283 Encounter for screening for malignant neoplasm of skin: Secondary | ICD-10-CM | POA: Diagnosis not present

## 2022-03-13 ENCOUNTER — Encounter: Payer: Self-pay | Admitting: Internal Medicine

## 2022-03-19 ENCOUNTER — Ambulatory Visit: Payer: Medicare PPO | Attending: Cardiovascular Disease | Admitting: Cardiovascular Disease

## 2022-03-19 ENCOUNTER — Encounter: Payer: Self-pay | Admitting: Cardiovascular Disease

## 2022-03-19 VITALS — BP 150/60 | HR 68 | Ht 69.5 in | Wt 252.0 lb

## 2022-03-19 DIAGNOSIS — I10 Essential (primary) hypertension: Secondary | ICD-10-CM | POA: Diagnosis not present

## 2022-03-19 DIAGNOSIS — G4733 Obstructive sleep apnea (adult) (pediatric): Secondary | ICD-10-CM

## 2022-03-19 DIAGNOSIS — I2510 Atherosclerotic heart disease of native coronary artery without angina pectoris: Secondary | ICD-10-CM

## 2022-03-19 DIAGNOSIS — E782 Mixed hyperlipidemia: Secondary | ICD-10-CM

## 2022-03-19 DIAGNOSIS — R600 Localized edema: Secondary | ICD-10-CM | POA: Diagnosis not present

## 2022-03-19 MED ORDER — EZETIMIBE 10 MG PO TABS: 10.0000 mg | ORAL_TABLET | Freq: Every day | ORAL | 3 refills | Status: DC

## 2022-03-19 NOTE — Assessment & Plan Note (Signed)
History of obstructive sleep apnea on CPAP. 

## 2022-03-19 NOTE — Assessment & Plan Note (Signed)
History of essential hypertension blood pressure measured today at 150/60.  He is on valsartan/hydrochlorothiazide and metoprolol.

## 2022-03-19 NOTE — Progress Notes (Signed)
03/19/2022 Kevin [name]   07-Apr-1946  [id number]  Primary Physician Sharilyn Sites, MD Primary Cardiologist: Lorretta Harp MD FACP, [place], [place], [place]  HPI:  Kevin [name] is a 76 y.o.   severely overweight divorced Caucasian male, father of 1 child, who I last saw in the office 03/08/21. 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 He has a history of CAD, status post remote LAD and circumflex stenting in 2001 and 2004 respectively by Drs. [name] and [name]. He stopped smoking and drinking at that time as well. His other problems include hypertension, hyperlipidemia, and non-insulin-requiring diabetes. He also has obstructive sleep apnea, on CPAP. He has had lap-banding in the past with ultimate reaccumulation of his weight. He has had endovenous ablation by Dr. Elisabeth Cara of his left greater saphenous vein, which really did not afford significant clinical improvement. He had a functional study performed December 22, 2008, which was nonischemic, and another one June 24, 2011, because of chest pain, which was low risk. He has been asymptomatic.Dr. [name] recently checked his lipid profile last week as an outpatient. He was admitted to [facility] last month which has been shortness of breath. He underwent cardiac catheterization by Dr. Jeralene Peters Smith's revealing patent LAD and circumflex stents and a left dominant system with an occluded nondominant RCA. Medical therapy was recommended. He experimented with stopping his newly added diabetes medicines one of which apparently was contributing to his symptoms. He feels clinically improved after stopping this medication. Since I saw him in March 2016 he was admitted with unstable angina on 02/14/15 and underwent cardiac catheterization by Dr. [name] His LAD stent had diffuse in-stent restenosis with a 95% distal edge stenosis which was restented using a 3.5 x 28 mm long synergy drug-eluting stent. He  did have an ostial diagonal branch stenosis which was "jailedand a 75% first obtuse marginal branch stenosis as well as an occluded nondominant RCA.   He was started on Synthroid at 100 mcg and noticed tachypalpitations.  An event monitor showed occasional PACs, PVCs and short runs of SVT.  His dose was lowered to 50 mcg and his palpitations have resolved.  He had his right shoulder operated on by Dr. Hillery Aldo 08/23/2020 which he has recuperated from.  Since I saw him a year ago he continues to do well.  He has had some issues with edema which is fairly stable on oral diuretics.  He did have a exploratory laparotomy and had removal of the Lap-Band and sounds like torsion which was surgically corrected.  He denies chest pain or shortness of breath.     Current Meds  Medication Sig   Ascorbic Acid (VITAMIN C) 1000 MG tablet Take 1,000 mg by mouth daily.   aspirin 81 MG tablet Take 81 mg by mouth daily.   clopidogrel (PLAVIX) 75 MG tablet TAKE ONE TABLET BY MOUTH ONCE DAILY.   Coenzyme Q10 (CO Q 10) 10 MG CAPS Take 1 capsule by mouth daily.   fluticasone (FLONASE) 50 MCG/ACT nasal spray Place 2 sprays into both nostrils daily.   furosemide (LASIX) 40 MG tablet Take 40 mg by mouth daily.   glipiZIDE (GLUCOTROL) 5 MG tablet Take 5 mg by mouth daily as needed.   JANUVIA 100 MG tablet Take 100 mg by mouth daily.   levothyroxine (SYNTHROID) 50 MCG tablet Take 50 mcg by mouth daily before breakfast.   metoprolol (TOPROL-XL) 100  MG 24 hr tablet Take 100 mg by mouth daily.   niacin (NIASPAN) 1000 MG CR tablet TAKE 1 TABLET BY MOUTH AT BEDTIME.   pravastatin (PRAVACHOL) 40 MG tablet Take 1 tablet (40 mg total) by mouth every evening.   tamsulosin (FLOMAX) 0.4 MG CAPS capsule Take 0.4 mg by mouth daily.   valsartan-hydrochlorothiazide (DIOVAN-HCT) 160-12.5 MG tablet Take 1 tablet by mouth daily.     Allergies  Allergen Reactions   Irbesartan Shortness Of Breath and Other (See Comments)    Prednisone Anaphylaxis, Swelling and Other (See Comments)    SWELLING OF THE LIPS   Ramipril Shortness Of Breath and Other (See Comments)   Shellfish Allergy Anaphylaxis    Shrimp, selfish   Tradjenta [Linagliptin] Shortness Of Breath   Cephalexin Hives and Swelling   Clindamycin Hcl Hives and Swelling   Clindamycin Hcl Other (See Comments)   Codeine Nausea And Vomiting and Other (See Comments)    AFFECTS HEART   Contrast Media [Iodinated Contrast Media]     Can be administered if Benadryl is administered prior to prevent reaction    Crab Extract Allergy Skin Test Other (See Comments)   Fenofibrate Other (See Comments)    "My skin started peeling off"   Gadolinium Derivatives Other (See Comments)   Glycotrol [Support-500] Other (See Comments)    unknown   Indomethacin Other (See Comments)    UNKNOWN REACTION   Minocycline Other (See Comments)   Minocycline Hcl Hives and Swelling   Morphine And Related Nausea And Vomiting    PROJECTILE VOMITING   Shrimp Extract Allergy Skin Test Other (See Comments)   Sulfa Drugs Cross Reactors Hives and Swelling   Sulfamethoxazole-Trimethoprim Other (See Comments)   Tetracyclines & Related    Tree Extract Itching    Social History   Socioeconomic History   Marital status: Divorced    Spouse name: Not on file   Number of children: 1   Years of education: Not on file   Highest education level: Not on file  Occupational History   Occupation: Leisure centre manager: [employer]    Comment: [employer]  Tobacco Use   Smoking status: Former    Packs/day: 3.00    Years: 30.00    Total pack years: 90.00    Types: Cigarettes    Start date: 11/18/1964    Quit date: 10/04/1994    Years since quitting: 27.4   Smokeless tobacco: Never   Tobacco comments:    quit smoking 19 years ago  Vaping Use   Vaping Use: Never used  Substance and Sexual Activity   Alcohol use: Yes    Alcohol/week: 0.0 standard drinks of alcohol    Comment: 3-4  times/year   Drug use: No   Sexual activity: Not Currently  Other Topics Concern   Not on file  Social History Narrative   Not on file   Social Determinants of Health   Financial Resource Strain: Not on file  Food Insecurity: Not on file  Transportation Needs: Not on file  Physical Activity: Not on file  Stress: Not on file  Social Connections: Not on file  Intimate Partner Violence: Not on file     Review of Systems: General: negative for chills, fever, night sweats or weight changes.  Cardiovascular: negative for chest pain, dyspnea on exertion, edema, orthopnea, palpitations, paroxysmal nocturnal dyspnea or shortness of breath Dermatological: negative for rash Respiratory: negative for cough or wheezing Urologic: negative for hematuria Abdominal:  negative for nausea, vomiting, diarrhea, bright red blood per rectum, melena, or hematemesis Neurologic: negative for visual changes, syncope, or dizziness All other systems reviewed and are otherwise negative except as noted above.    Blood pressure (!) 150/60, pulse 68, height 5' 9.5" (1.765 m), weight 252 lb (114.3 kg), SpO2 99 %.  General appearance: alert and no distress Neck: no adenopathy, no carotid bruit, no JVD, supple, symmetrical, trachea midline, and thyroid not enlarged, symmetric, no tenderness/mass/nodules Lungs: clear to auscultation bilaterally Heart: regular rate and rhythm, S1, S2 normal, no murmur, click, rub or gallop Extremities: extremities normal, atraumatic, no cyanosis or edema Pulses: 2+ and symmetric Skin: Skin color, texture, turgor normal. No rashes or lesions Neurologic: Grossly normal  EKG sinus rhythm at 68 without ST or T wave changes.  Personally reviewed this EKG.  ASSESSMENT AND PLAN:   OSA on CPAP History of obstructive sleep apnea on CPAP.  Coronary artery disease, hx LAD and LCX stenting in 2001 & 2004, last cath 2009 with patent stents. History of CAD status post remote LAD and  circumflex stenting in 2001 and 2004 respectively by Drs. [name] and [name].  He has had multiple cath since that time including a cath by Dr. [name] and most recently by Dr. [name] 02/14/2015 which time he had restenting of his LAD.  He denies chest pain.  Essential hypertension History of essential hypertension blood pressure measured today at 150/60.  He is on valsartan/hydrochlorothiazide and metoprolol.  Mixed hyperlipidemia History of hyperlipidemia on Pravachol with lipid profile performed/11/23 revealing total cholesterol 170, LDL 93 and HDL 36.  He is not at goal for secondary prevention.  I am going to begin him on on Zetia and we will recheck a lipid liver profile in 3 months.  He has been on atorvastatin in the past which caused arthralgias.  Edema, lower extremity, bil History bilateral lower extremity edema on diuretics.  He is currently not edematous.     Lorretta Harp MD FACP,FACC,FAHA, [facility] 03/19/2022 11:22 AM

## 2022-03-19 NOTE — Patient Instructions (Signed)
Medication Instructions:  Your physician has recommended you make the following change in your medication:   -Start taking ezetimibe (zetia) '10mg'$  once daily.  *If you need a refill on your cardiac medications before your next appointment, please call your pharmacy*   Lab Work: Your physician recommends that you return for lab work in: 3 months for FASTING lipid/liver panel.  If you have labs (blood work) drawn today and your tests are completely normal, you will receive your results only by: [place] (if you have MyChart) OR A paper copy in the mail If you have any lab test that is abnormal or we need to change your treatment, we will call you to review the results.    Follow-Up: At [facility], you and your health needs are our priority.  As part of our continuing mission to provide you with exceptional heart care, we have created designated Provider Care Teams.  These Care Teams include your primary Cardiologist (physician) and Advanced Practice Providers (APPs -  Physician Assistants and Nurse Practitioners) who all work together to provide you with the care you need, when you need it.  We recommend signing up for the patient portal called "MyChart".  Sign up information is provided on this After Visit Summary.  MyChart is used to connect with patients for Virtual Visits (Telemedicine).  Patients are able to view lab/test results, encounter notes, upcoming appointments, etc.  Non-urgent messages can be sent to your provider as well.   To learn more about what you can do with MyChart, go to [email or web address].    Your next appointment:   6 month(s)  The format for your next appointment:   In Person  Provider:   Almyra Deforest, PA-C       Then, Quay Burow, MD will plan to see you again in 12 month(s).

## 2022-03-19 NOTE — Assessment & Plan Note (Signed)
History of CAD status post remote LAD and circumflex stenting in 2001 and 2004 respectively by Drs. [name] and [name].  He has had multiple cath since that time including a cath by Dr. [name] and most recently by Dr. [name] 02/14/2015 which time he had restenting of his LAD.  He denies chest pain.

## 2022-03-19 NOTE — Assessment & Plan Note (Signed)
History of hyperlipidemia on Pravachol with lipid profile performed/11/23 revealing total cholesterol 170, LDL 93 and HDL 36.  He is not at goal for secondary prevention.  I am going to begin him on on Zetia and we will recheck a lipid liver profile in 3 months.  He has been on atorvastatin in the past which caused arthralgias.

## 2022-03-19 NOTE — Assessment & Plan Note (Signed)
History bilateral lower extremity edema on diuretics.  He is currently not edematous.

## 2022-03-20 ENCOUNTER — Other Ambulatory Visit: Payer: Self-pay

## 2022-03-20 ENCOUNTER — Emergency Department (HOSPITAL_COMMUNITY): Payer: Medicare PPO

## 2022-03-20 ENCOUNTER — Emergency Department (HOSPITAL_COMMUNITY)
Admission: EM | Admit: 2022-03-20 | Discharge: 2022-03-20 | Disposition: A | Discharge status: Home / Self Care | Payer: Medicare PPO | Attending: Emergency Medicine | Admitting: Emergency Medicine

## 2022-03-20 ENCOUNTER — Encounter (HOSPITAL_COMMUNITY): Payer: Self-pay

## 2022-03-20 DIAGNOSIS — Z7902 Long term (current) use of antithrombotics/antiplatelets: Secondary | ICD-10-CM | POA: Diagnosis not present

## 2022-03-20 DIAGNOSIS — Z79899 Other long term (current) drug therapy: Secondary | ICD-10-CM | POA: Diagnosis not present

## 2022-03-20 DIAGNOSIS — J45909 Unspecified asthma, uncomplicated: Secondary | ICD-10-CM | POA: Insufficient documentation

## 2022-03-20 DIAGNOSIS — U071 COVID-19: Secondary | ICD-10-CM | POA: Diagnosis not present

## 2022-03-20 DIAGNOSIS — E119 Type 2 diabetes mellitus without complications: Secondary | ICD-10-CM | POA: Diagnosis not present

## 2022-03-20 DIAGNOSIS — Z7982 Long term (current) use of aspirin: Secondary | ICD-10-CM | POA: Insufficient documentation

## 2022-03-20 DIAGNOSIS — R0602 Shortness of breath: Secondary | ICD-10-CM | POA: Diagnosis not present

## 2022-03-20 DIAGNOSIS — Z7951 Long term (current) use of inhaled steroids: Secondary | ICD-10-CM | POA: Diagnosis not present

## 2022-03-20 DIAGNOSIS — I2510 Atherosclerotic heart disease of native coronary artery without angina pectoris: Secondary | ICD-10-CM | POA: Diagnosis not present

## 2022-03-20 DIAGNOSIS — I10 Essential (primary) hypertension: Secondary | ICD-10-CM | POA: Insufficient documentation

## 2022-03-20 LAB — CBC WITH DIFFERENTIAL/PLATELET
Abs Immature Granulocytes: 0.01 10*3/uL (ref 0.00–0.07)
Basophils Absolute: 0.1 10*3/uL (ref 0.0–0.1)
Basophils Relative: 1 %
Eosinophils Absolute: 0.2 10*3/uL (ref 0.0–0.5)
Eosinophils Relative: 4 %
HCT: 32.5 % — ABNORMAL LOW (ref 39.0–52.0)
Hemoglobin: 10.8 g/dL — ABNORMAL LOW (ref 13.0–17.0)
Immature Granulocytes: 0 %
Lymphocytes Relative: 15 %
Lymphs Abs: 0.8 10*3/uL (ref 0.7–4.0)
MCH: 32.2 pg (ref 26.0–34.0)
MCHC: 33.2 g/dL (ref 30.0–36.0)
MCV: 97 fL (ref 80.0–100.0)
Monocytes Absolute: 0.4 10*3/uL (ref 0.1–1.0)
Monocytes Relative: 8 %
Neutro Abs: 4.2 10*3/uL (ref 1.7–7.7)
Neutrophils Relative %: 72 %
Platelets: 170 10*3/uL (ref 150–400)
RBC: 3.35 MIL/uL — ABNORMAL LOW (ref 4.22–5.81)
RDW: 13.8 % (ref 11.5–15.5)
WBC: 5.7 10*3/uL (ref 4.0–10.5)
nRBC: 0 % (ref 0.0–0.2)

## 2022-03-20 LAB — COMPREHENSIVE METABOLIC PANEL
ALT: 14 U/L (ref 0–44)
AST: 13 U/L — ABNORMAL LOW (ref 15–41)
Albumin: 4 g/dL (ref 3.5–5.0)
Alkaline Phosphatase: 37 U/L — ABNORMAL LOW (ref 38–126)
Anion gap: 9 (ref 5–15)
BUN: 34 mg/dL — ABNORMAL HIGH (ref 8–23)
CO2: 26 mmol/L (ref 22–32)
Calcium: 9.7 mg/dL (ref 8.9–10.3)
Chloride: 102 mmol/L (ref 98–111)
Creatinine, Ser: 1.24 mg/dL (ref 0.61–1.24)
GFR, Estimated: >60 mL/min (ref >60)
Glucose, Bld: 211 mg/dL — ABNORMAL HIGH (ref 70–99)
Potassium: 4.3 mmol/L (ref 3.5–5.1)
Sodium: 137 mmol/L (ref 135–145)
Total Bilirubin: 0.7 mg/dL (ref 0.3–1.2)
Total Protein: 6.8 g/dL (ref 6.5–8.1)

## 2022-03-20 LAB — RESP PANEL BY RT-PCR (FLU A&B, COVID) ARPGX2
Influenza A by PCR: NEGATIVE
Influenza B by PCR: NEGATIVE
SARS Coronavirus 2 by RT PCR: POSITIVE — AB

## 2022-03-20 LAB — TROPONIN I (HIGH SENSITIVITY): Troponin I (High Sensitivity): 3 ng/L (ref <18)

## 2022-03-20 MED ORDER — PAXLOVID (300/100) 20 X 150 MG & 10 X 100MG PO TBPK: 3.0000 | ORAL_TABLET | Freq: Two times a day (BID) | ORAL | 0 refills | Status: AC

## 2022-03-20 MED ORDER — NIRMATRELVIR/RITONAVIR (PAXLOVID)TABLET: 3.0000 | ORAL_TABLET | Freq: Two times a day (BID) | ORAL | Status: DC

## 2022-03-20 NOTE — Discharge Instructions (Addendum)
Evaluation for your cough sore throat and shortness of breath revealed that you do have COVID.  Treating with Paxlovid which is an antiviral.  If you have worsening shortness of breath, chest pain, trouble swallowing or drooling please return to the emergency department for further evaluation.  Your COVID symptoms could last anywhere from a week to 14 days.  Recommend conservative treatment at home with really good hydration and you can also take Tylenol and ibuprofen as needed for your symptoms.

## 2022-03-20 NOTE — ED Provider Notes (Signed)
[facility] EMERGENCY DEPARTMENT Provider Note   CSN: [id number] Arrival date & time: 03/20/22  1009     History  Chief Complaint  Patient presents with   Shortness of Breath   HPI Kevin [name] is a 76 y.o. male with CAD, diabetes, asthma and hypertension presenting for shortness of breath.  States that yesterday evening around 9 PM he was watching TV and started to feel "sick".  Symptoms included sore throat, trouble breathing and a dry cough.  Denies fever and wheezing.  States that he later learned that evening that his downstairs neighbors were positive for COVID.  States he had his COVID booster about 2 years ago.  Denies nausea vomiting diarrhea.   Shortness of Breath      Home Medications Prior to Admission medications   Medication Sig Start Date End Date Taking? Authorizing Provider  Ascorbic Acid (VITAMIN C) 1000 MG tablet Take 1,000 mg by mouth daily.   Yes [provider]  aspirin 81 MG tablet Take 81 mg by mouth daily.   Yes [provider]  clopidogrel (PLAVIX) 75 MG tablet TAKE ONE TABLET BY MOUTH ONCE DAILY. 01/27/22  Yes Lorretta Harp, MD  Coenzyme Q10 (CO Q 10) 10 MG CAPS Take 1 capsule by mouth daily.   Yes [provider]  fluticasone (FLONASE) 50 MCG/ACT nasal spray Place 2 sprays into both nostrils daily.   Yes [provider]  furosemide (LASIX) 40 MG tablet Take 40 mg by mouth daily.   Yes [provider]  glipiZIDE (GLUCOTROL) 5 MG tablet Take 5 mg by mouth daily as needed.   Yes [provider]  JANUVIA 100 MG tablet Take 100 mg by mouth daily. 03/12/22  Yes [provider]  levothyroxine (SYNTHROID) 50 MCG tablet Take 50 mcg by mouth daily before breakfast.   Yes [provider]  metoprolol (TOPROL-XL) 100 MG 24 hr tablet Take 100 mg by mouth daily.   Yes [provider]  niacin (NIASPAN) 1000 MG CR tablet TAKE 1 TABLET BY MOUTH AT BEDTIME. 12/24/21  Yes Lorretta Harp,  MD  pravastatin (PRAVACHOL) 40 MG tablet Take 1 tablet (40 mg total) by mouth every evening. 04/18/19  Yes Lorretta Harp, MD  tamsulosin (FLOMAX) 0.4 MG CAPS capsule Take 0.4 mg by mouth daily. 07/17/20  Yes [provider]  valsartan-hydrochlorothiazide (DIOVAN-HCT) 160-12.5 MG tablet Take 1 tablet by mouth daily. 10/17/21  Yes Warren Lacy, PA-C  colchicine 0.6 MG tablet Take 0.6 mg by mouth daily as needed. Patient not taking: Reported on 03/19/2022    [provider]  diphenhydrAMINE (BENADRYL) 25 MG tablet Take 25 mg by mouth every 6 (six) hours as needed. Patient not taking: Reported on 03/19/2022    [provider]  docusate sodium (COLACE) 100 MG capsule Take 1 capsule (100 mg total) by mouth every 12 (twelve) hours. Patient not taking: Reported on 03/19/2022 02/01/22   Milton Ferguson, MD  ezetimibe (ZETIA) 10 MG tablet Take 1 tablet (10 mg total) by mouth daily. Patient not taking: Reported on 03/20/2022 03/19/22   Lorretta Harp, MD  GAVILYTE-G 236 g solution once. Patient not taking: Reported on 03/19/2022 02/12/22   [provider]  traMADol (ULTRAM) 50 MG tablet Take 1 tablet (50 mg total) by mouth every 6 (six) hours as needed for moderate pain. Patient not taking: Reported on 03/19/2022 09/23/21   Orson Eva, MD      Allergies    Irbesartan,  Prednisone, Ramipril, Shellfish allergy, Tradjenta [linagliptin], Cephalexin, Clindamycin hcl, Clindamycin hcl, Codeine, Contrast media [iodinated contrast media], Crab extract allergy skin test, Fenofibrate, Gadolinium derivatives, Glycotrol [support-500], Indomethacin, Minocycline, Minocycline hcl, Morphine and related, Shrimp extract allergy skin test, Sulfa drugs cross reactors, Sulfamethoxazole-trimethoprim, Tetracyclines & related, and Tree extract    Review of Systems   Review of Systems  Respiratory:  Positive for shortness of breath.     Physical Exam Updated Vital Signs BP 127/64    Pulse 61   Temp 98.2 F (36.8 C) (Oral)   Resp 15   Ht '5\' 9"'$  (1.753 m)   Wt 113.9 kg   SpO2 100%   BMI 37.07 kg/m  Physical Exam Vitals and nursing note reviewed.  HENT:     Head: Normocephalic and atraumatic.     Mouth/Throat:     Mouth: Mucous membranes are moist.  Eyes:     General:        Right eye: No discharge.        Left eye: No discharge.     Conjunctiva/sclera: Conjunctivae normal.  Cardiovascular:     Rate and Rhythm: Normal rate and regular rhythm.     Pulses: Normal pulses.     Heart sounds: Normal heart sounds.  Pulmonary:     Effort: Pulmonary effort is normal.     Breath sounds: Normal breath sounds.  Abdominal:     General: Abdomen is flat.     Palpations: Abdomen is soft.  Skin:    General: Skin is warm and dry.  Neurological:     General: No focal deficit present.  Psychiatric:        Mood and Affect: Mood normal.     ED Results / Procedures / Treatments   Labs (all labs ordered are listed, but only abnormal results are displayed) Labs Reviewed  RESP PANEL BY RT-PCR (FLU A&B, COVID) ARPGX2 - Abnormal; Notable for the following components:      Result Value   SARS Coronavirus 2 by RT PCR POSITIVE (*)    All other components within normal limits  COMPREHENSIVE METABOLIC PANEL - Abnormal; Notable for the following components:   Glucose, Bld 211 (*)    BUN 34 (*)    AST 13 (*)    Alkaline Phosphatase 37 (*)    All other components within normal limits  CBC WITH DIFFERENTIAL/PLATELET - Abnormal; Notable for the following components:   RBC 3.35 (*)    Hemoglobin 10.8 (*)    HCT 32.5 (*)    All other components within normal limits  TROPONIN I (HIGH SENSITIVITY)    EKG None  Radiology DG Chest 2 View  Result Date: 03/20/2022 CLINICAL DATA:  Shortness of breath. EXAM: CHEST - 2 VIEW COMPARISON:  Chest radiograph dated February 01, 2022 FINDINGS: The heart size and mediastinal contours are within normal limits. Both lungs are clear.  Multilevel degenerate disc disease of the thoracic spine. No acute osseous abnormality. IMPRESSION: No active cardiopulmonary disease. Electronically Signed   By: Keane Police D.O.   On: 03/20/2022 11:11    Procedures Procedures    Medications Ordered in ED Medications  nirmatrelvir/ritonavir EUA (PAXLOVID) 3 tablet (has no administration in time range)    ED Course/ Medical Decision Making/ A&P                           Medical Decision Making Amount and/or Complexity of Data Reviewed Labs: ordered. Radiology: ordered.  Patient presented for shortness of breath, cough congestion sore throat.  Also endorsed recent contact with his neighbors who are COVID-positive.  Exam was overall reassuring, lungs sounded clear bilaterally with good air entry, normal vitals.  I viewed and interpreted the respiratory panel which did reveal that he is COVID-positive.  I viewed and interpreted his labs which revealed he is mildly anemic but near his baseline and improved from last. Treated his ongoing COVID infection with Paxlovid.  Discussed return precautions.  So considered ACS but unlikely given no chest pain, reassuring EKG and troponins within normal limits.        Final Clinical Impression(s) / ED Diagnoses Final diagnoses:  COVID    Rx / DC Orders ED Discharge Orders     None         Harriet Pho, PA-C 03/20/22 1309    Milton Ferguson, MD 03/21/22 [phone]

## 2022-03-20 NOTE — ED Triage Notes (Signed)
Patient states the family he lives with has covid. Patient states he is having headache, SHOB, and fatigue.

## 2022-03-21 ENCOUNTER — Telehealth: Payer: Self-pay | Admitting: *Deleted

## 2022-03-21 NOTE — Telephone Encounter (Signed)
Pt called to cancel his procedure for 04/02/22 at 9am. He says he has COVID and is in the process of moving. He will call back to reschedule.

## 2022-03-31 ENCOUNTER — Encounter (HOSPITAL_COMMUNITY): Payer: Medicare PPO

## 2022-04-02 ENCOUNTER — Ambulatory Visit (HOSPITAL_COMMUNITY): Admit: 2022-04-02 | Payer: Medicare PPO | Admitting: Internal Medicine

## 2022-04-02 ENCOUNTER — Encounter (HOSPITAL_COMMUNITY): Payer: Self-pay

## 2022-04-02 SURGERY — COLONOSCOPY WITH PROPOFOL
Anesthesia: Monitor Anesthesia Care

## 2022-04-18 DIAGNOSIS — I2510 Atherosclerotic heart disease of native coronary artery without angina pectoris: Secondary | ICD-10-CM | POA: Diagnosis not present

## 2022-04-18 DIAGNOSIS — E6609 Other obesity due to excess calories: Secondary | ICD-10-CM | POA: Diagnosis not present

## 2022-04-18 DIAGNOSIS — N183 Chronic kidney disease, stage 3 unspecified: Secondary | ICD-10-CM | POA: Diagnosis not present

## 2022-04-18 DIAGNOSIS — E782 Mixed hyperlipidemia: Secondary | ICD-10-CM | POA: Diagnosis not present

## 2022-04-18 DIAGNOSIS — E119 Type 2 diabetes mellitus without complications: Secondary | ICD-10-CM | POA: Diagnosis not present

## 2022-04-18 DIAGNOSIS — Z6839 Body mass index (BMI) 39.0-39.9, adult: Secondary | ICD-10-CM | POA: Diagnosis not present

## 2022-04-18 DIAGNOSIS — G4733 Obstructive sleep apnea (adult) (pediatric): Secondary | ICD-10-CM | POA: Diagnosis not present

## 2022-04-30 ENCOUNTER — Other Ambulatory Visit: Payer: Self-pay | Admitting: Cardiovascular Disease

## 2022-05-19 DIAGNOSIS — M170 Bilateral primary osteoarthritis of knee: Secondary | ICD-10-CM | POA: Diagnosis not present

## 2022-05-19 DIAGNOSIS — M1712 Unilateral primary osteoarthritis, left knee: Secondary | ICD-10-CM | POA: Diagnosis not present

## 2022-07-16 DIAGNOSIS — E119 Type 2 diabetes mellitus without complications: Secondary | ICD-10-CM | POA: Diagnosis not present

## 2022-07-18 DIAGNOSIS — N183 Chronic kidney disease, stage 3 unspecified: Secondary | ICD-10-CM | POA: Diagnosis not present

## 2022-07-18 DIAGNOSIS — Z6841 Body Mass Index (BMI) 40.0 and over, adult: Secondary | ICD-10-CM | POA: Diagnosis not present

## 2022-07-18 DIAGNOSIS — I2510 Atherosclerotic heart disease of native coronary artery without angina pectoris: Secondary | ICD-10-CM | POA: Diagnosis not present

## 2022-07-18 DIAGNOSIS — E6609 Other obesity due to excess calories: Secondary | ICD-10-CM | POA: Diagnosis not present

## 2022-07-18 DIAGNOSIS — K56609 Unspecified intestinal obstruction, unspecified as to partial versus complete obstruction: Secondary | ICD-10-CM | POA: Diagnosis not present

## 2022-07-18 DIAGNOSIS — E1159 Type 2 diabetes mellitus with other circulatory complications: Secondary | ICD-10-CM | POA: Diagnosis not present

## 2022-07-24 ENCOUNTER — Encounter: Payer: Self-pay | Admitting: *Deleted

## 2022-08-05 ENCOUNTER — Encounter: Payer: Self-pay | Admitting: *Deleted

## 2022-08-05 ENCOUNTER — Ambulatory Visit: Payer: Self-pay | Admitting: *Deleted

## 2022-08-05 NOTE — Patient Instructions (Signed)
Visit Information  Thank you for taking time to visit with me today. Please don't hesitate to contact me if I can be of assistance to you.   Please call the care guide team at [phone] if you need to cancel or reschedule your appointment.   If you are experiencing a Mental Health or Behavioral Health Crisis or need someone to talk to, please call the Suicide and Crisis Lifeline: 988 call the [place] National Suicide Prevention Lifeline: 1-800-273-8255 or TTY: 1-800-799-4 TTY (1-800-799-4889) to talk to a trained counselor call 1-800-273-TALK (toll free, 24 hour hotline) go to [facility] [address], [place] ([phone]) call the [place] Crisis Line: 800-939-9988 call 911  Patient verbalizes understanding of instructions and care plan provided today and agrees to view in MyChart. Active MyChart status and patient understanding of how to access instructions and care plan via MyChart confirmed with patient.     No further follow up required.  Lithzy Bernard, BSW, MSW, LCSW  Licensed Clinical Social Worker  [facility]  Mailing Address-[address], [place], [place] [address] Physical Address-[address], [place], [place] [address] Toll Free Main # [phone] Fax # [phone] Cell # [phone] [email or web address]            

## 2022-08-05 NOTE — Patient Outreach (Signed)
  Care Coordination   Initial Visit Note   08/05/2022  Name: Kevin [name] MRN: [id number] DOB: December 31, 1945  Kevin [name] is a 77 y.o. year old male who sees Sharilyn Sites, MD for primary care. I spoke with [name] by phone today.  What matters to the patients health and wellness today?   No interventions identified.  Patient denies need for social work involvement at this time.  SDOH assessments and interventions completed:  Yes.  SDOH Interventions Today    Flowsheet Row Most Recent Value  SDOH Interventions   Food Insecurity Interventions Intervention Not Indicated  Housing Interventions Intervention Not Indicated  Transportation Interventions Intervention Not Indicated, Patient Resources (Friends/Family), Payor Benefit  Utilities Interventions Intervention Not Indicated  Alcohol Usage Interventions Intervention Not Indicated (Score <7)  Financial Strain Interventions Intervention Not Indicated  Physical Activity Interventions Patient Refused  Stress Interventions Intervention Not Indicated  Social Connections Interventions Intervention Not Indicated     Care Coordination Interventions:  No, not indicated.   Follow up plan: No further intervention required.   Encounter Outcome:  Pt. Visit Completed.   Nat Christen, BSW, MSW, LCSW  Licensed Education officer, [facility]  Mailing [address] Ruiz [address], [place], [place] [address] Physical Address-[address] [place] [address], [place], [place] [address] Toll Free Main # [phone] Fax # [phone] Cell # [phone] [email or web address]

## 2022-08-14 DIAGNOSIS — R972 Elevated prostate specific antigen [PSA]: Secondary | ICD-10-CM | POA: Diagnosis not present

## 2022-08-15 DIAGNOSIS — Z1331 Encounter for screening for depression: Secondary | ICD-10-CM | POA: Diagnosis not present

## 2022-08-15 DIAGNOSIS — H6591 Unspecified nonsuppurative otitis media, right ear: Secondary | ICD-10-CM | POA: Diagnosis not present

## 2022-08-15 DIAGNOSIS — Z6841 Body Mass Index (BMI) 40.0 and over, adult: Secondary | ICD-10-CM | POA: Diagnosis not present

## 2022-08-15 DIAGNOSIS — Z0001 Encounter for general adult medical examination with abnormal findings: Secondary | ICD-10-CM | POA: Diagnosis not present

## 2022-08-21 DIAGNOSIS — R3912 Poor urinary stream: Secondary | ICD-10-CM | POA: Diagnosis not present

## 2022-08-21 DIAGNOSIS — R972 Elevated prostate specific antigen [PSA]: Secondary | ICD-10-CM | POA: Diagnosis not present

## 2022-08-21 DIAGNOSIS — N401 Enlarged prostate with lower urinary tract symptoms: Secondary | ICD-10-CM | POA: Diagnosis not present

## 2022-08-26 ENCOUNTER — Encounter: Payer: Self-pay | Admitting: General Surgery

## 2022-08-26 ENCOUNTER — Ambulatory Visit: Payer: Medicare PPO | Admitting: General Surgery

## 2022-08-26 VITALS — BP 161/88 | HR 64 | Temp 97.9°F | Resp 14 | Ht 69.0 in | Wt 250.0 lb

## 2022-08-26 DIAGNOSIS — K439 Ventral hernia without obstruction or gangrene: Secondary | ICD-10-CM | POA: Diagnosis not present

## 2022-08-26 DIAGNOSIS — M6208 Separation of muscle (nontraumatic), other site: Secondary | ICD-10-CM

## 2022-08-26 NOTE — Patient Instructions (Signed)
Will order a CT and call with the results.

## 2022-09-16 DIAGNOSIS — Z08 Encounter for follow-up examination after completed treatment for malignant neoplasm: Secondary | ICD-10-CM | POA: Diagnosis not present

## 2022-09-16 DIAGNOSIS — Z85820 Personal history of malignant melanoma of skin: Secondary | ICD-10-CM | POA: Diagnosis not present

## 2022-09-16 DIAGNOSIS — C44319 Basal cell carcinoma of skin of other parts of face: Secondary | ICD-10-CM | POA: Diagnosis not present

## 2022-09-16 DIAGNOSIS — D225 Melanocytic nevi of trunk: Secondary | ICD-10-CM | POA: Diagnosis not present

## 2022-09-16 DIAGNOSIS — Z1283 Encounter for screening for malignant neoplasm of skin: Secondary | ICD-10-CM | POA: Diagnosis not present

## 2022-10-21 DIAGNOSIS — E6609 Other obesity due to excess calories: Secondary | ICD-10-CM | POA: Diagnosis not present

## 2022-10-21 DIAGNOSIS — N183 Chronic kidney disease, stage 3 unspecified: Secondary | ICD-10-CM | POA: Diagnosis not present

## 2022-10-21 DIAGNOSIS — I2510 Atherosclerotic heart disease of native coronary artery without angina pectoris: Secondary | ICD-10-CM | POA: Diagnosis not present

## 2022-10-21 DIAGNOSIS — I10 Essential (primary) hypertension: Secondary | ICD-10-CM | POA: Diagnosis not present

## 2022-10-21 DIAGNOSIS — E782 Mixed hyperlipidemia: Secondary | ICD-10-CM | POA: Diagnosis not present

## 2022-10-21 DIAGNOSIS — E1159 Type 2 diabetes mellitus with other circulatory complications: Secondary | ICD-10-CM | POA: Diagnosis not present

## 2022-10-21 DIAGNOSIS — Z6837 Body mass index (BMI) 37.0-37.9, adult: Secondary | ICD-10-CM | POA: Diagnosis not present

## 2022-10-23 ENCOUNTER — Ambulatory Visit (HOSPITAL_COMMUNITY)
Admission: RE | Admit: 2022-10-23 | Discharge: 2022-10-23 | Disposition: A | Discharge status: Home / Self Care | Payer: Medicare PPO | Source: Ambulatory Visit | Attending: General Surgery | Admitting: General Surgery

## 2022-10-23 DIAGNOSIS — K439 Ventral hernia without obstruction or gangrene: Secondary | ICD-10-CM | POA: Insufficient documentation

## 2022-10-23 DIAGNOSIS — K5730 Diverticulosis of large intestine without perforation or abscess without bleeding: Secondary | ICD-10-CM | POA: Diagnosis not present

## 2022-10-28 NOTE — Progress Notes (Signed)
Patient with hernia as suspected. Would recommend robotic assisted ventral hernia repair with mesh. In addition to the hernia which is prob about 4-5cm he has a large diastasis (weakness of the abdominal wall). If he does not want repair now he needs to know precautions for going to the ED if he has obstructive symptoms or worsening pain in the area or hardness which would be a concern for strangulation.  He would have port sites and would need to avoid water/ sand etc for at least 3-4 weeks after surgery. This could play into timing of surgery if he has vacations scheduled.

## 2022-11-03 DIAGNOSIS — Z08 Encounter for follow-up examination after completed treatment for malignant neoplasm: Secondary | ICD-10-CM | POA: Diagnosis not present

## 2022-11-03 DIAGNOSIS — X32XXXD Exposure to sunlight, subsequent encounter: Secondary | ICD-10-CM | POA: Diagnosis not present

## 2022-11-03 DIAGNOSIS — Z85828 Personal history of other malignant neoplasm of skin: Secondary | ICD-10-CM | POA: Diagnosis not present

## 2022-11-03 DIAGNOSIS — L570 Actinic keratosis: Secondary | ICD-10-CM | POA: Diagnosis not present

## 2022-11-27 ENCOUNTER — Other Ambulatory Visit: Payer: Self-pay | Admitting: Physician Assistant

## 2022-12-05 IMAGING — DX DG FOOT COMPLETE 3+V*R*
3 series · 3 of 3 positions shown · non-contrast
Comparison: No recent prior.

CLINICAL DATA: Foot pain.  No injury.  Concern for gout.

EXAM:
RIGHT FOOT COMPLETE - 3+ VIEW

[foot ap]
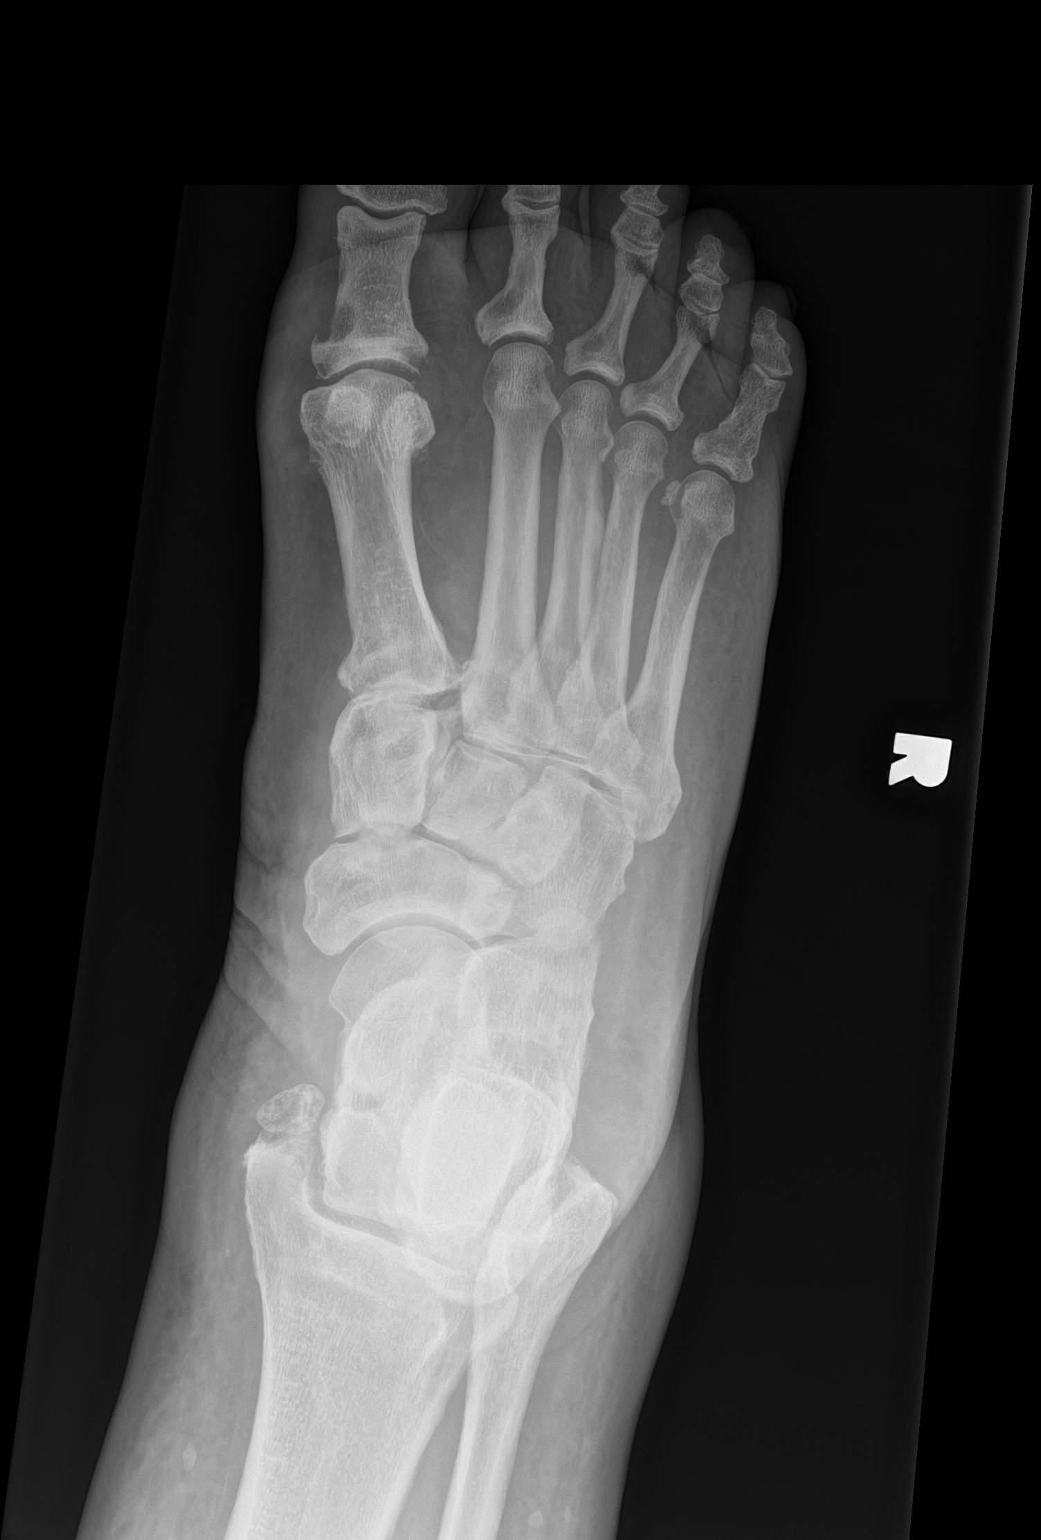

[foot obl]
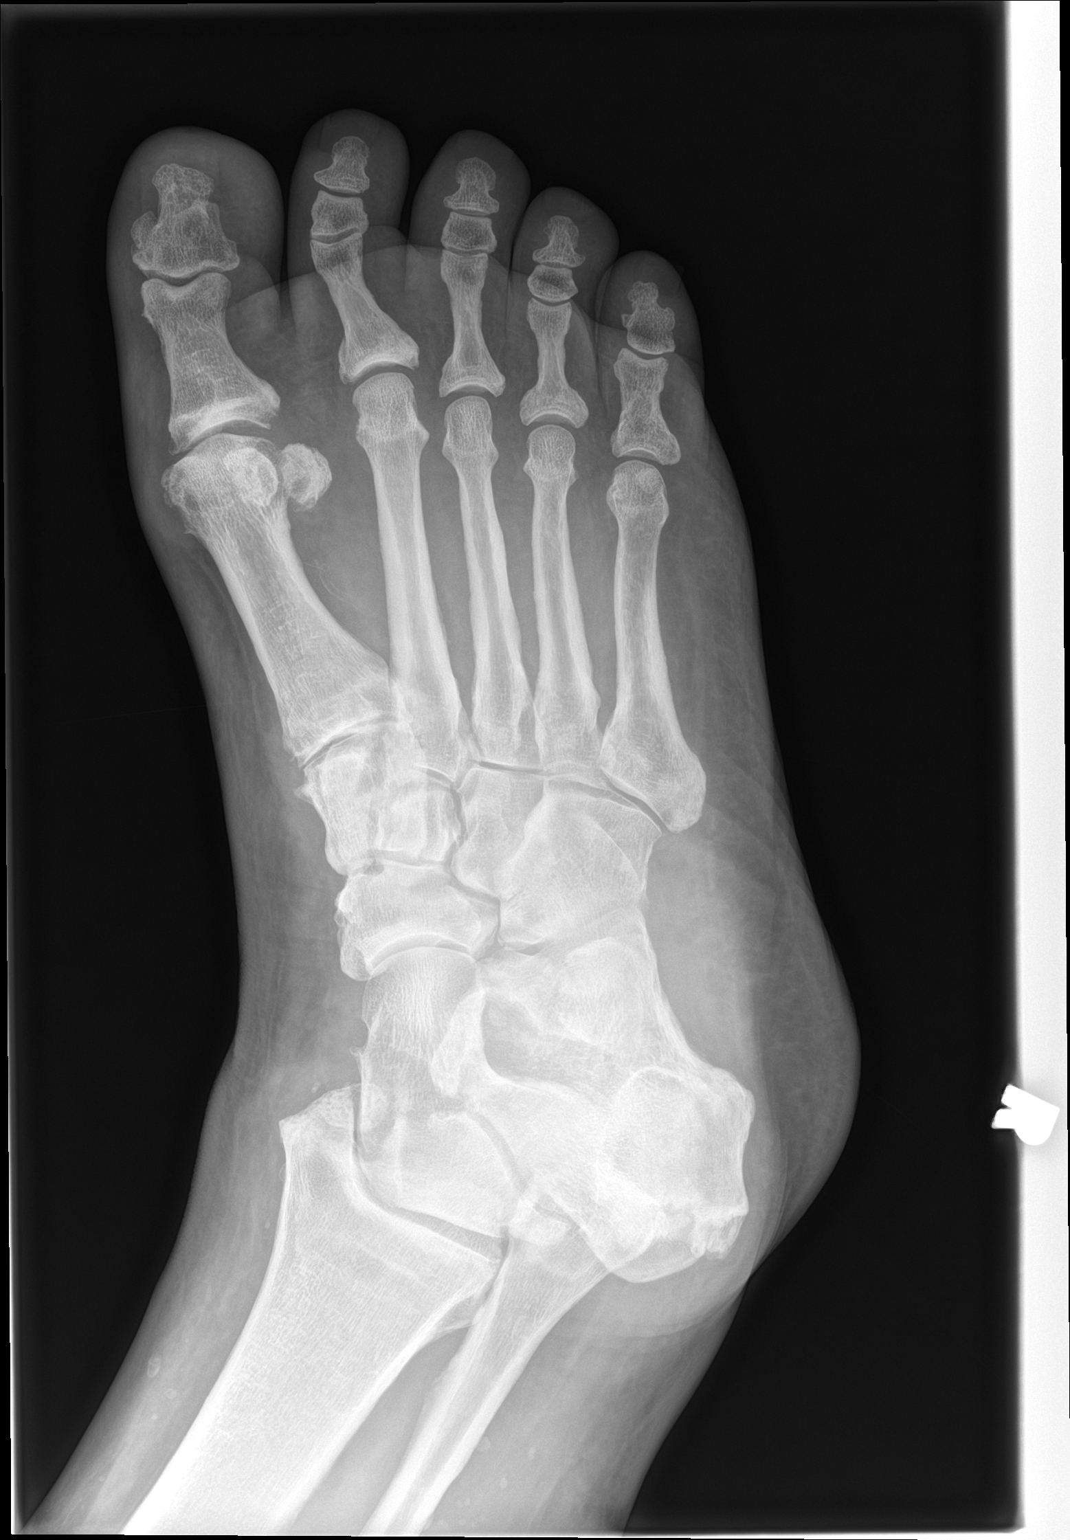

[foot lat]
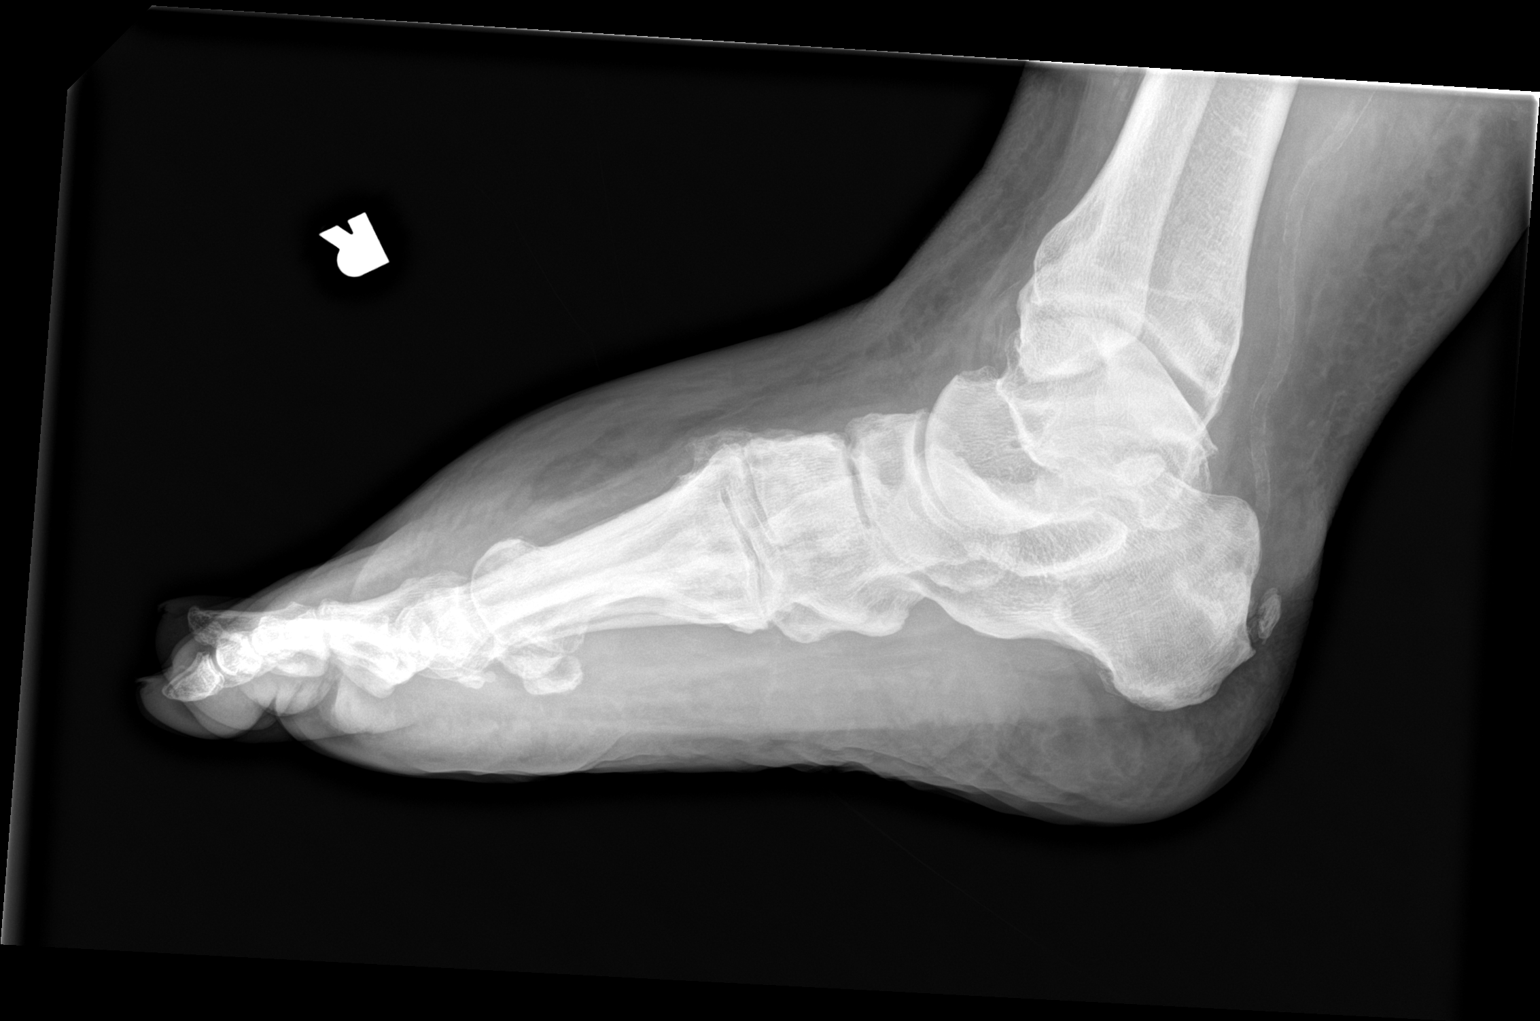

[3 of 3 positions shown; findings below may reference images not displayed]

FINDINGS: Severe diffuse soft tissue swelling. No radiopaque foreign body.
Prominent erosive changes noted about the first MTP joint suggesting
a process such as gout. Infection cannot be excluded. Erosive
changes may also be present in the navicula and first cuneiform.
Diffuse degenerative change. Medial malleolar fracture. No evidence
of acute fracture or dislocation. Peripheral vascular calcification.
IMPRESSION: 1. Severe diffuse soft tissue swelling. No radiopaque foreign body.

2. Prominent erosive changes noted about the first MTP joint
suggesting a process such as gout. Infection cannot be excluded.
Erosive changes may also be present in the navicula and first
cuneiform.

3. Diffuse degenerative change. No evidence of fracture or
dislocation.

4.  Peripheral vascular disease.

## 2022-12-16 ENCOUNTER — Ambulatory Visit: Payer: Medicare PPO | Attending: Physician Assistant | Admitting: Physician Assistant

## 2022-12-16 VITALS — BP 112/64 | HR 77 | Ht 69.5 in | Wt 230.5 lb

## 2022-12-16 DIAGNOSIS — E119 Type 2 diabetes mellitus without complications: Secondary | ICD-10-CM

## 2022-12-16 DIAGNOSIS — I10 Essential (primary) hypertension: Secondary | ICD-10-CM | POA: Diagnosis not present

## 2022-12-16 DIAGNOSIS — E785 Hyperlipidemia, unspecified: Secondary | ICD-10-CM | POA: Diagnosis not present

## 2022-12-16 DIAGNOSIS — Z01818 Encounter for other preprocedural examination: Secondary | ICD-10-CM

## 2022-12-16 DIAGNOSIS — I2510 Atherosclerotic heart disease of native coronary artery without angina pectoris: Secondary | ICD-10-CM

## 2022-12-16 DIAGNOSIS — R600 Localized edema: Secondary | ICD-10-CM | POA: Diagnosis not present

## 2022-12-16 NOTE — Progress Notes (Signed)
Cardiology Office Note:  .   Date:  12/16/2022  ID:  Kevin [name], DOB [birth year], MRN [id number] PCP: Assunta Found, MD  [facility] Providers Cardiologist:  Nanetta Batty, MD     History of Present Illness: .   Kevin [name] is a 77 y.o. male with a hx of CAD s/p remote LAD and left circumflex stenting in 2001 and 2004 respectively, remote tobacco use, hypertension, hyperlipidemia, DM2, obstructive sleep apnea and obesity s/p lap band in 2011.  He had chest pain in 2016.  Cardiac catheterization at the time showed LAD stent had diffuse in-stent restenosis and 95% distal edge stenosis that was treated with DES, he also had ostial diagonal lesion that was jailed and 75% OM1 lesion as well as occluded nondominant RCA.  He has a history of palpitation following Synthroid medication.  Event monitor showed occasional PAC and PVC and short run of SVT.  Synthroid dose was lowered to 50 mcg and palpitation resolved.  He was admitted in 2023 to the hospital with small bowel obstruction.  He underwent exploratory laparotomy and reduction of volvulus, lysis of adhesion and removal of gastric band.  He had acute on chronic renal insufficiency and the was given IV fluid.  Creatinine peaked at 3.4, baseline 1.1.  Creatinine was 1.2 on discharge.  His weight prior to surgery was 253 pounds, his weight was 271 pounds on discharge.  He had lower extremity edema despite Lasix 40 mg daily.  Lasix was later increased to twice a day for 2 days.  He called cardiology service concerning Plavix causing itchiness, Plavix was discontinued.  When he was seen on 10/17/2021 by Juanda Crumble, PA-C, he continued to have lower extremity edema.  His weight was 261 pounds which is 8 pounds higher than his dry weight of 253 pounds.  HCTZ was resumed via Diovan-HCTZ.  Lasix was increased to 80 mg daily for 5 days.  He was most recently seen by Dr. Gery Pray in November 2023 at which time he was doing well.  Patient presents today  for follow-up.  He has a significant ventral hernia and has been followed by Dr. Larae Grooms.  He says he likely will require hernia repair and also knee surgery as well.  He denies any chest pain or shortness of breath.  He is able to accomplish more than 4 METS of activity.  He appears to be euvolemic on exam.  He is aware that he need to hold Plavix for 5 to 7 days prior to the procedure and restart as soon as possible afterward at the surgeon's discretion.  I would prefer he stay on the aspirin through the procedures.   ROS:   He denies chest pain, palpitations, dyspnea, pnd, orthopnea, n, v, dizziness, syncope, edema, weight gain, or early satiety. All other systems reviewed and are otherwise negative except as noted above.    Studies Reviewed: [name]   EKG Interpretation Date/Time:  Tuesday December 16 2022 10:32:03 EDT Ventricular Rate:  77 PR Interval:  182 QRS Duration:  94 QT Interval:  374 QTC Calculation: 423 R Axis:   6  Text Interpretation: Normal sinus rhythm Normal ECG When compared with ECG of 20-Mar-2022 11:07, PREVIOUS ECG IS PRESENT Confirmed by Azalee Course [phone]) on 12/16/2022 11:22:18 AM     Risk Assessment/Calculations:             Physical Exam:   VS:  BP 112/64   Pulse 77   Ht 5' 9.5" (1.765 m)  Wt 230 lb 8 oz (104.6 kg)   SpO2 97%   BMI 33.55 kg/m    Wt Readings from Last 3 Encounters:  12/16/22 230 lb 8 oz (104.6 kg)  08/26/22 250 lb (113.4 kg)  03/20/22 251 lb (113.9 kg)    GEN: Well nourished, well developed in no acute distress NECK: No JVD; No carotid bruits CARDIAC: RRR, no murmurs, rubs, gallops RESPIRATORY:  Clear to auscultation without rales, wheezing or rhonchi  ABDOMEN: Soft, non-tender, non-distended EXTREMITIES:  No edema; No deformity   ASSESSMENT AND PLAN: .    Preoperative clearance: Patient says he likely will require ventral hernia repair and a knee surgery at some point.  He has no problem achieving more than 4 METS of activity.   He denies any recent chest pain or worsening dyspnea.  He is at acceptable risk to proceed with surgery from the cardiac perspective.  He will need to hold Plavix for 5 to 7 days prior to the surgery and restart as soon as possible afterward at his surgeon's discretion.  I recommend that he continue on the aspirin through the procedures.  CAD: Denies any recent chest pain.  Currently on aspirin and Plavix  Hypertension: Blood pressure well-controlled  Hyperlipidemia: On Zetia and pravastatin  DM2: Managed by primary care provider.       Dispo: Follow-up with Dr. Allyson Sabal in 6 months.  Signed, Azalee Course, PA

## 2022-12-16 NOTE — Patient Instructions (Signed)
Medication Instructions:  HOLD Plavix for 5-7 days prior to surgery   NO CHANGES TO MEDS  *If you need a refill on your cardiac medications before your next appointment, please call your pharmacy*    Follow-Up: At [facility], you and your health needs are our priority.  As part of our continuing mission to provide you with exceptional heart care, we have created designated Provider Care Teams.  These Care Teams include your primary Cardiologist (physician) and Advanced Practice Providers (APPs -  Physician Assistants and Nurse Practitioners) who all work together to provide you with the care you need, when you need it.  We recommend signing up for the patient portal called "MyChart".  Sign up information is provided on this After Visit Summary.  MyChart is used to connect with patients for Virtual Visits (Telemedicine).  Patients are able to view lab/test results, encounter notes, upcoming appointments, etc.  Non-urgent messages can be sent to your provider as well.   To learn more about what you can do with MyChart, go to [email or web address].    Your next appointment:    February 2025 with Dr. Allyson Sabal  ** call in October for this appointment

## 2022-12-21 ENCOUNTER — Encounter (HOSPITAL_COMMUNITY): Payer: Self-pay

## 2022-12-21 ENCOUNTER — Emergency Department (HOSPITAL_COMMUNITY)
Admission: EM | Admit: 2022-12-21 | Discharge: 2022-12-21 | Disposition: A | Discharge status: Other Acute Inpatient Hospital | Payer: Medicare PPO | Source: Home / Self Care | Attending: Emergency Medicine | Admitting: Emergency Medicine

## 2022-12-21 ENCOUNTER — Other Ambulatory Visit: Payer: Self-pay

## 2022-12-21 DIAGNOSIS — J029 Acute pharyngitis, unspecified: Secondary | ICD-10-CM | POA: Diagnosis not present

## 2022-12-21 DIAGNOSIS — J45909 Unspecified asthma, uncomplicated: Secondary | ICD-10-CM | POA: Diagnosis not present

## 2022-12-21 DIAGNOSIS — I2510 Atherosclerotic heart disease of native coronary artery without angina pectoris: Secondary | ICD-10-CM | POA: Diagnosis not present

## 2022-12-21 DIAGNOSIS — R5381 Other malaise: Secondary | ICD-10-CM | POA: Diagnosis not present

## 2022-12-21 DIAGNOSIS — Z20822 Contact with and (suspected) exposure to covid-19: Secondary | ICD-10-CM | POA: Insufficient documentation

## 2022-12-21 DIAGNOSIS — I10 Essential (primary) hypertension: Secondary | ICD-10-CM | POA: Diagnosis not present

## 2022-12-21 DIAGNOSIS — E119 Type 2 diabetes mellitus without complications: Secondary | ICD-10-CM | POA: Insufficient documentation

## 2022-12-21 DIAGNOSIS — Z87891 Personal history of nicotine dependence: Secondary | ICD-10-CM | POA: Diagnosis not present

## 2022-12-21 LAB — RESP PANEL BY RT-PCR (RSV, FLU A&B, COVID)  RVPGX2
Influenza A by PCR: NEGATIVE
Influenza B by PCR: NEGATIVE
Resp Syncytial Virus by PCR: NEGATIVE
SARS Coronavirus 2 by RT PCR: NEGATIVE

## 2022-12-21 LAB — GROUP A STREP BY PCR: Group A Strep by PCR: NOT DETECTED

## 2022-12-21 NOTE — Discharge Instructions (Signed)
You were evaluated in the Emergency Department and after careful evaluation, we did not find any emergent condition requiring admission or further testing in the hospital.  Your exam/testing today is overall reassuring.  Your COVID test was negative.  Your strep throat test was negative.  Suspect a different virus causing your symptoms.  Recommend use of Tylenol or Motrin at home for discomfort.  Please return to the Emergency Department if you experience any worsening of your condition.   Thank you for allowing [place] to be a part of your care.

## 2022-12-21 NOTE — ED Provider Notes (Signed)
[facility]-EMERGENCY DEPT [facility] Emergency Department Provider Note MRN:  [id number]  Arrival date & time: 12/21/22     Chief Complaint   Covid Exposure   History of Present Illness   Kevin [name] is a 77 y.o. year-old male with a history of diabetes, CAD presenting to the ED with chief complaint of COVID exposure.  Patient was possibly exposed to COVID a few days ago.  Having some general malaise and sore throat.  No chest pain or shortness of breath, no abdominal pain, vomiting or diarrhea.  Review of Systems  A thorough review of systems was obtained and all systems are negative except as noted in the HPI and PMH.   Patient's Health History    Past Medical History:  Diagnosis Date   Angio-edema    Asthma    as a child. no problems now.    CAD (coronary artery disease)    Cellulitis 5/11   left leg   Diabetes mellitus    Diverticulosis    Diverticulosis    HTN (hypertension)    Hyperlipidemia    Melanoma (HCC) 10/11   left foot   Melanoma of foot (HCC) 06/02/2011   Microscopic colitis 10/09/10   Colonoscopy dr Jena Gauss   Obesity    OSA on CPAP 01/20/2014   Psoriasis    S/P colonoscopy 07/03/03    Dr [name]->hyperplastic rectal polyp   S/P endoscopy 10/09/10   lap band intact, duodenal erosions   Seasonal allergies    Sleep apnea    Urticaria     Past Surgical History:  Procedure Laterality Date   ABDOMINAL SURGERY     ADENOIDECTOMY     APPENDECTOMY     CARDIAC CATHETERIZATION N/A 02/14/2015   Procedure: Left Heart Cath and Coronary Angiography;  Surgeon: Corky Crafts, MD;  Location: [facility] INVASIVE CV LAB;  Service: Cardiovascular;  Laterality: N/A;   CARDIAC CATHETERIZATION N/A 02/14/2015   Procedure: Coronary Stent Intervention;  Surgeon: Corky Crafts, MD;  Location: [facility] INVASIVE CV LAB;  Service: Cardiovascular;  Laterality: N/A;   COLONOSCOPY N/A 10/09/2010   normal rectum, long redundant colon, pancolonic diverticula, 6mm cecal polyp, diminutive  polyp at the appendiceal orifice ablated with tip of hot snare (Biopsies suggestive of microscopic colitis; polyp benign; small bowel ok)   CORONARY ANGIOPLASTY  2002/2005/2008   CORONARY ANGIOPLASTY WITH STENT PLACEMENT  06/11/2007   PTCA & stenting distal CX, PTCA & balloon angioplasty in-stent restenotic mid CX coronary artery stent   ESOPHAGOGASTRODUODENOSCOPY N/A 10/09/2010   Normal esophagus, lap band present, 2nd portion duodenum with erosions without frank ulcer   GASTRIC BANDING PORT REVISION N/A 09/20/2021   Procedure: GASTRIC BAND REMOVAL;  Surgeon: Lucretia Roers, MD;  Location: [facility] ORS;  Service: General;  Laterality: N/A;   KNEE SURGERY     left   LAPAROTOMY N/A 09/20/2021   Procedure: EXPLORATORY LAPAROTOMY,  LYSIS OF ADHESIONS, REDUCTION OF VOLVULUS;  Surgeon: Lucretia Roers, MD;  Location: [facility] ORS;  Service: General;  Laterality: N/A;   LEFT HEART CATHETERIZATION WITH CORONARY ANGIOGRAM N/A 07/15/2013   Procedure: LEFT HEART CATHETERIZATION WITH CORONARY ANGIOGRAM;  Surgeon: Lesleigh Noe, MD;  Location: [facility] CATH LAB;  Service: Cardiovascular;  Laterality: N/A;   NM MYOCAR PERF WALL MOTION  06/24/2011   small mild fixed anteroseptal & anteroapical defects.   PENILE PROSTHESIS IMPLANT     SHOULDER SURGERY     left   SINOSCOPY     TONSILLECTOMY  TYMPANOSTOMY TUBE PLACEMENT      Family History  Problem Relation Age of Onset   Coronary artery disease Mother    Diabetes Father    Liver cancer Brother 48   Celiac disease Other 79       brother's daughter   Allergic rhinitis Neg Hx    Angioedema Neg Hx    Asthma Neg Hx    Atopy Neg Hx    Eczema Neg Hx    Immunodeficiency Neg Hx    Urticaria Neg Hx     Social History   Socioeconomic History   Marital status: Divorced    Spouse name: Not on file   Number of children: 1   Years of education: 12   Highest education level: 12th grade  Occupational History   Occupation: Manufacturing engineer: [employer]    Comment: [place]  Tobacco Use   Smoking status: Former    Current packs/day: 0.00    Average packs/day: 3.0 packs/day for 30.0 years (89.9 ttl pk-yrs)    Types: Cigarettes    Start date: 11/18/1964    Quit date: 10/04/1994    Years since quitting: 28.2    Passive exposure: Past   Smokeless tobacco: Never   Tobacco comments:    quit smoking 19 years ago  Vaping Use   Vaping status: Never Used  Substance and Sexual Activity   Alcohol use: Not Currently    Comment: 3-4 times/year   Drug use: No   Sexual activity: Not Currently    Partners: Female  Other Topics Concern   Not on file  Social History Narrative   Not on file   Social Determinants of Health   Financial Resource Strain: Low Risk  (08/05/2022)   Overall Financial Resource Strain (CARDIA)    Difficulty of Paying Living Expenses: Not hard at all  Food Insecurity: No Food Insecurity (08/05/2022)   Hunger Vital Sign    Worried About Running Out of Food in the Last Year: Never true    Ran Out of Food in the Last Year: Never true  Transportation Needs: No Transportation Needs (08/05/2022)   PRAPARE - Administrator, Civil Service (Medical): No    Lack of Transportation (Non-Medical): No  Physical Activity: Inactive (08/05/2022)   Exercise Vital Sign    Days of Exercise per Week: 0 days    Minutes of Exercise per Session: 0 min  Stress: No Stress Concern Present (08/05/2022)   [employer] of Occupational Health - Occupational Stress Questionnaire    Feeling of Stress : Only a little  Social Connections: Moderately Integrated (08/05/2022)   Social Connection and Isolation Panel [NHANES]    Frequency of Communication with Friends and Family: More than three times a week    Frequency of Social Gatherings with Friends and Family: More than three times a week    Attends Religious Services: More than 4 times per year    Active Member of [employer] or Organizations: Yes    Attends Banker  Meetings: More than 4 times per year    Marital Status: Divorced  Intimate Partner Violence: Not At Risk (08/05/2022)   Humiliation, Afraid, Rape, and Kick questionnaire    Fear of Current or Ex-Partner: No    Emotionally Abused: No    Physically Abused: No    Sexually Abused: No     Physical Exam   Vitals:   12/21/22 0416  BP: (!) 147/71  Pulse:  82  Resp: 18  Temp: 97.9 F (36.6 C)  SpO2: 100%    CONSTITUTIONAL: Well-appearing, NAD NEURO/PSYCH:  Alert and oriented x 3, no focal deficits EYES:  eyes equal and reactive ENT/NECK:  no LAD, no JVD CARDIO: Regular rate, well-perfused, normal S1 and S2 PULM:  CTAB no wheezing or rhonchi GI/GU:  non-distended, non-tender MSK/SPINE:  No gross deformities, no edema SKIN:  no rash, atraumatic   *Additional and/or pertinent findings included in MDM below  Diagnostic and Interventional Summary    EKG Interpretation Date/Time:    Ventricular Rate:    PR Interval:    QRS Duration:    QT Interval:    QTC Calculation:   R Axis:      Text Interpretation:         Labs Reviewed  RESP PANEL BY RT-PCR (RSV, FLU A&B, COVID)  [id number]  GROUP A STREP BY PCR    No orders to display    Medications - No data to display   Procedures  /  Critical Care Procedures  ED Course and Medical Decision Making  Initial Impression and Ddx Patient having fairly minimal URI symptoms.  Sore throat.  Vital signs reassuring, clear lungs, no increased work of breathing.  Minimal erythema to the posterior oropharynx.  Strep throat considered, COVID, viral pharyngitis all possible.  Past medical/surgical history that increases complexity of ED encounter: CAD  Interpretation of Diagnostics COVID-negative strep negative  Patient Reassessment and Ultimate Disposition/Management     Discharge  Patient management required discussion with the following services or consulting groups:  None  Complexity of Problems Addressed Acute complicated  illness or Injury  Additional Data Reviewed and Analyzed Further history obtained from: None  Additional Factors Impacting ED Encounter Risk None  Elmer Sow. Pilar Plate, MD [facility] Emergency Medicine [facility] [email or web address]  Final Clinical Impressions(s) / ED Diagnoses     ICD-10-CM   1. Sore throat  J02.9     2. Malaise  R53.81       ED Discharge Orders     None        Discharge Instructions Discussed with and Provided to Patient:     Discharge Instructions      You were evaluated in the Emergency Department and after careful evaluation, we did not find any emergent condition requiring admission or further testing in the hospital.  Your exam/testing today is overall reassuring.  Your COVID test was negative.  Your strep throat test was negative.  Suspect a different virus causing your symptoms.  Recommend use of Tylenol or Motrin at home for discomfort.  Please return to the Emergency Department if you experience any worsening of your condition.   Thank you for allowing [place] to be a part of your care.       Sabas Sous, MD 12/21/22 [phone]

## 2022-12-21 NOTE — ED Triage Notes (Signed)
Pt arrived via POV from [facility] c/o sore throat and possible covid exposure. Pt reports symptoms similar to his last experience with Covid.

## 2023-01-07 IMAGING — DX DG CHEST 2V
2 series · 2 of 2 positions shown · non-contrast
Comparison: 09/04/2017

CLINICAL DATA: Shortness of breath and leg swelling, progressive.

EXAM:
CHEST - 2 VIEW

[chest lat]
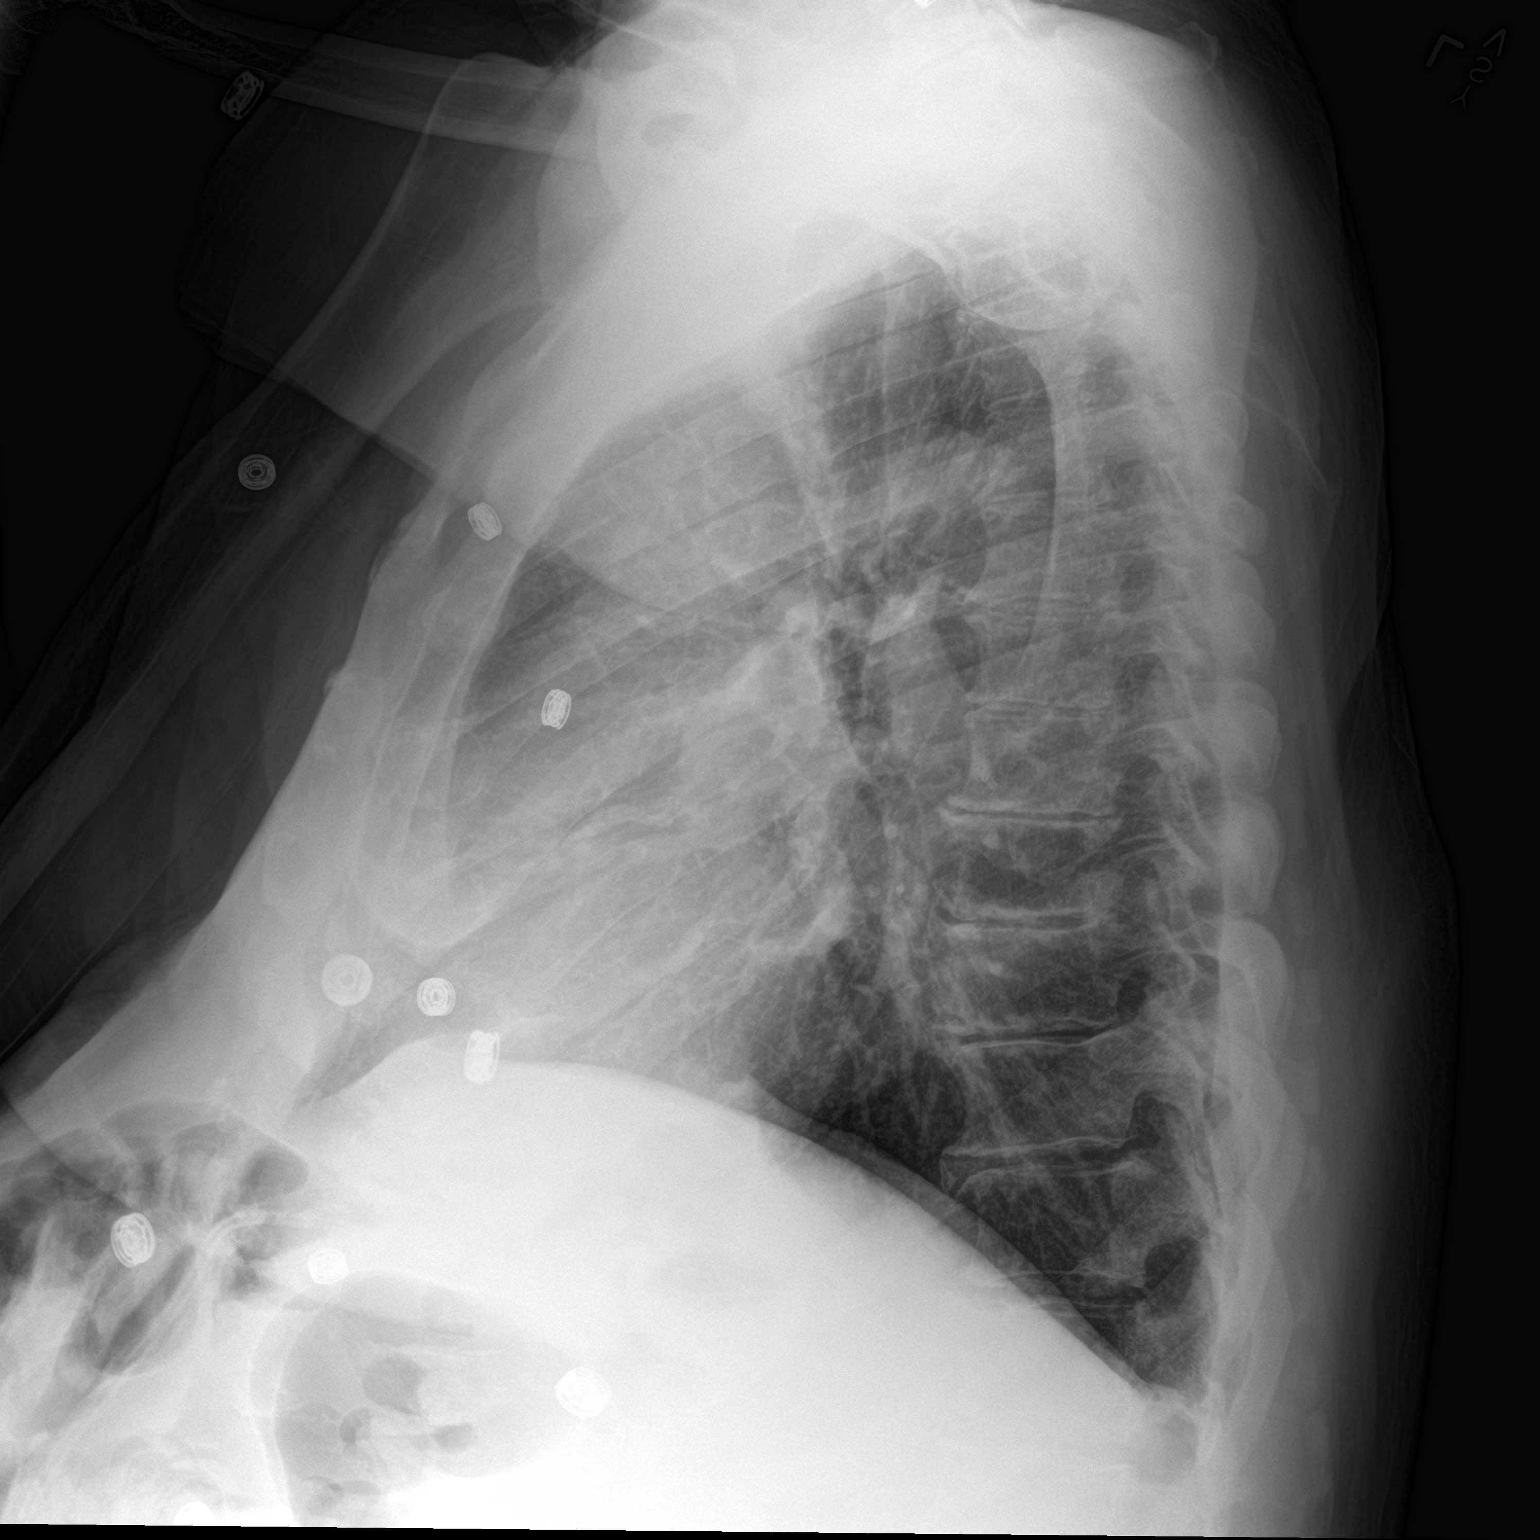

[chest ap]
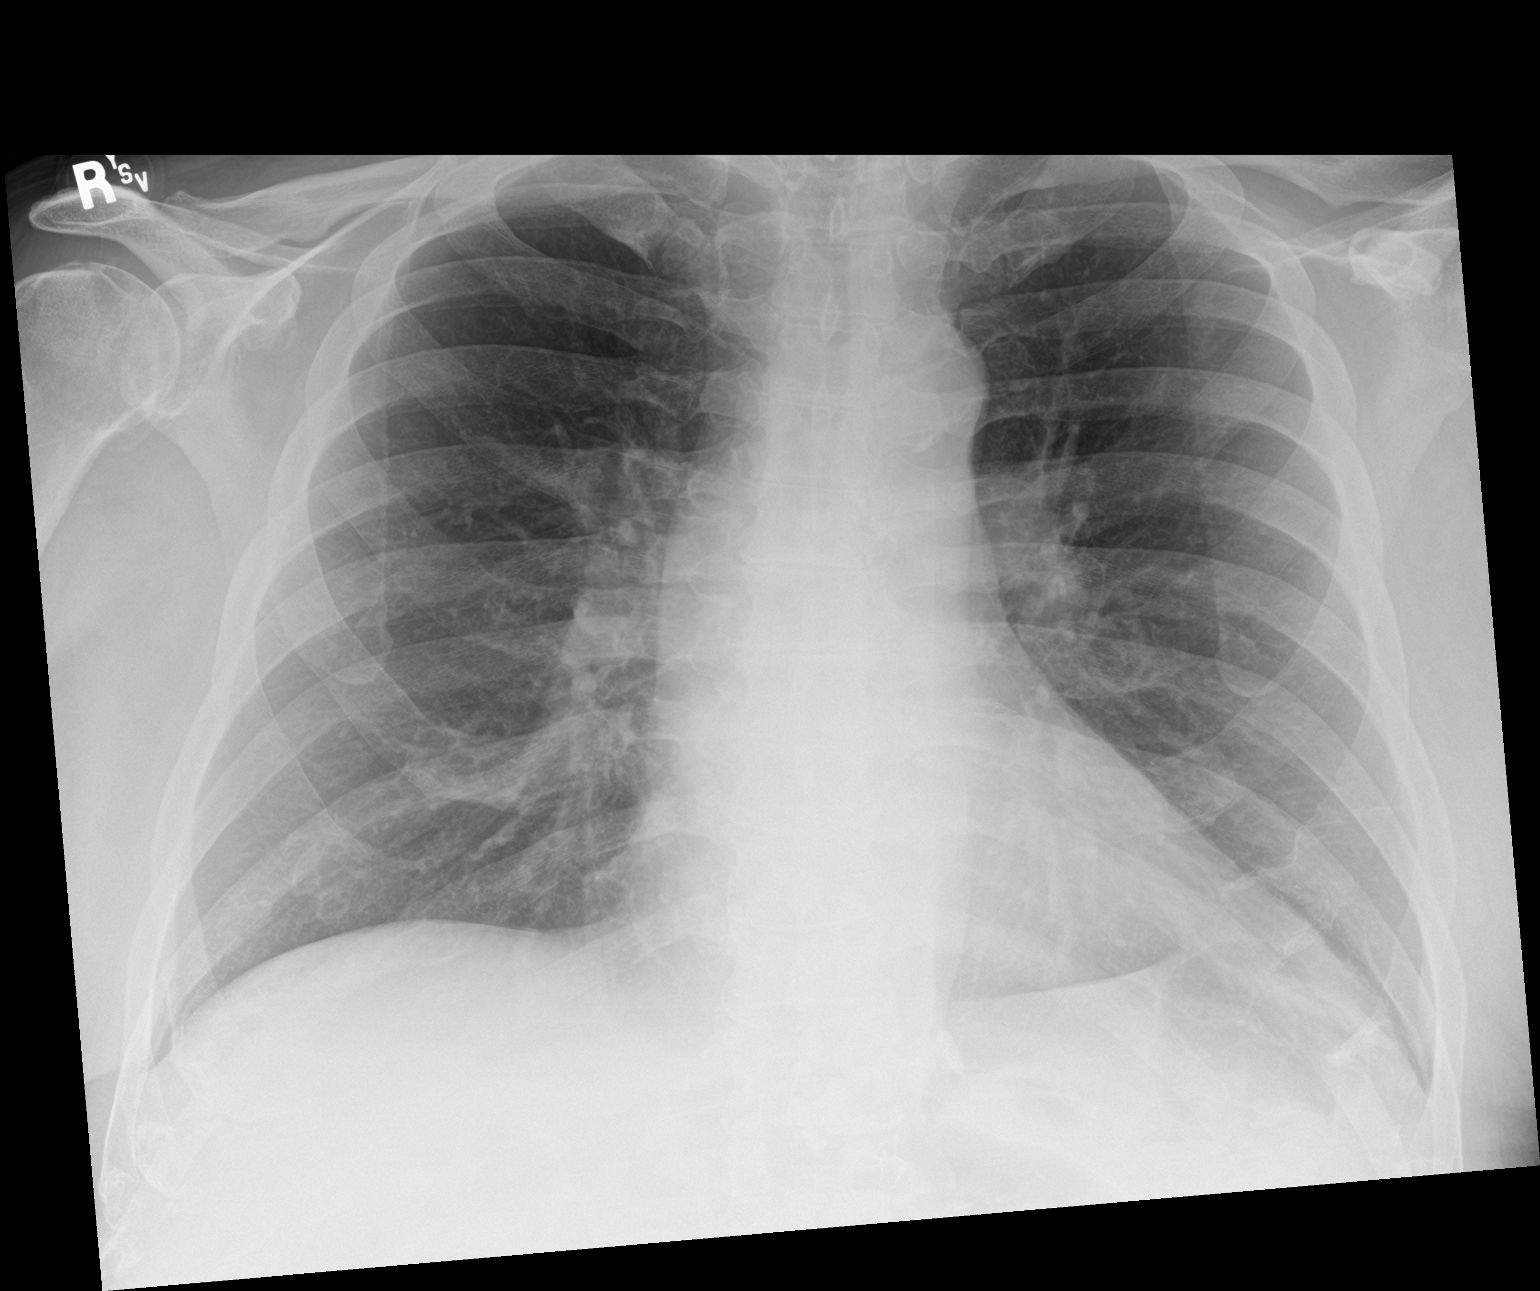

[2 of 2 positions shown; findings below may reference images not displayed]

FINDINGS: The cardiomediastinal contours are normal. Aortic atherosclerosis.
The lungs are clear. Pulmonary vasculature is normal. No
consolidation, pleural effusion, or pneumothorax. No acute osseous
abnormalities are seen.
IMPRESSION: No acute chest findings.

## 2023-01-20 ENCOUNTER — Ambulatory Visit: Payer: Medicare PPO | Admitting: General Surgery

## 2023-01-20 ENCOUNTER — Encounter: Payer: Self-pay | Admitting: General Surgery

## 2023-01-20 VITALS — BP 127/79 | HR 97 | Temp 98.0°F | Resp 14 | Ht 69.5 in | Wt 226.0 lb

## 2023-01-20 DIAGNOSIS — K439 Ventral hernia without obstruction or gangrene: Secondary | ICD-10-CM

## 2023-01-20 DIAGNOSIS — M6208 Separation of muscle (nontraumatic), other site: Secondary | ICD-10-CM

## 2023-01-20 NOTE — Progress Notes (Unsigned)
[facility] Note   HPI:  77 y.o. Male presents to clinic for follow-up evaluation of his hernia after this CT scan. Patient reports he is feeling ok and having no issues with the hernia. He is worried about surgery and wants to wait until the new year.   Review of Systems:  No obstructive symptoms No pain All other review of systems: otherwise negative   Vital Signs:  BP 127/79   Pulse 97   Temp 98 F (36.7 C) (Oral)   Resp 14   Ht 5' 9.5" (1.765 m)   Wt 226 lb (102.5 kg)   SpO2 98%   BMI 32.90 kg/m    Physical Exam:  Physical Exam Cardiovascular:     Rate and Rhythm: Normal rate.  Pulmonary:     Effort: Pulmonary effort is normal.  Abdominal:     General: There is no distension.     Palpations: Abdomen is soft.     Tenderness: There is abdominal tenderness.     Hernia: A hernia is present. Hernia is present in the ventral area.     Comments: reducible  Skin:    General: Skin is warm.  Neurological:     General: No focal deficit present.     Mental Status: He is alert.    Reviewed CT and showed patient- hernia above the umbilicus with SB and measures about 5cm.   Assessment:  77 y.o. yo Male with a hernia with small bowel that is reducible. He wants to wait for surgery. .  Plan:  Plan for return in January to discuss robotic assisted laparoscopic ventral hernia repair with mesh   He will call in January to make appt or if any issues between now and then.  Algis Greenhouse, MD [facility] [address]ella R[place], [place] [address] [phone] (office)

## 2023-01-20 NOTE — Patient Instructions (Signed)
Try benefiber or metamucil for constipation.  Try to get in more water in the day to help with constipation.  If this does not help try some miralax as needed.  Go to the Hospital if any pain or hardness in the hernia, or nausea/vomiting.

## 2023-01-22 DIAGNOSIS — E7849 Other hyperlipidemia: Secondary | ICD-10-CM | POA: Diagnosis not present

## 2023-01-22 DIAGNOSIS — I249 Acute ischemic heart disease, unspecified: Secondary | ICD-10-CM | POA: Diagnosis not present

## 2023-01-22 DIAGNOSIS — E6609 Other obesity due to excess calories: Secondary | ICD-10-CM | POA: Diagnosis not present

## 2023-01-22 DIAGNOSIS — I10 Essential (primary) hypertension: Secondary | ICD-10-CM | POA: Diagnosis not present

## 2023-01-22 DIAGNOSIS — E782 Mixed hyperlipidemia: Secondary | ICD-10-CM | POA: Diagnosis not present

## 2023-01-22 DIAGNOSIS — I2510 Atherosclerotic heart disease of native coronary artery without angina pectoris: Secondary | ICD-10-CM | POA: Diagnosis not present

## 2023-01-22 DIAGNOSIS — Z6836 Body mass index (BMI) 36.0-36.9, adult: Secondary | ICD-10-CM | POA: Diagnosis not present

## 2023-01-22 DIAGNOSIS — E1129 Type 2 diabetes mellitus with other diabetic kidney complication: Secondary | ICD-10-CM | POA: Diagnosis not present

## 2023-01-22 DIAGNOSIS — N183 Chronic kidney disease, stage 3 unspecified: Secondary | ICD-10-CM | POA: Diagnosis not present

## 2023-01-22 DIAGNOSIS — E1159 Type 2 diabetes mellitus with other circulatory complications: Secondary | ICD-10-CM | POA: Diagnosis not present

## 2023-01-22 DIAGNOSIS — E039 Hypothyroidism, unspecified: Secondary | ICD-10-CM | POA: Diagnosis not present

## 2023-01-27 DIAGNOSIS — Z08 Encounter for follow-up examination after completed treatment for malignant neoplasm: Secondary | ICD-10-CM | POA: Diagnosis not present

## 2023-01-27 DIAGNOSIS — X32XXXD Exposure to sunlight, subsequent encounter: Secondary | ICD-10-CM | POA: Diagnosis not present

## 2023-01-27 DIAGNOSIS — Z85820 Personal history of malignant melanoma of skin: Secondary | ICD-10-CM | POA: Diagnosis not present

## 2023-01-27 DIAGNOSIS — L570 Actinic keratosis: Secondary | ICD-10-CM | POA: Diagnosis not present

## 2023-01-27 DIAGNOSIS — Z85828 Personal history of other malignant neoplasm of skin: Secondary | ICD-10-CM | POA: Diagnosis not present

## 2023-01-30 ENCOUNTER — Other Ambulatory Visit: Payer: Self-pay | Admitting: Cardiovascular Disease

## 2023-02-02 DIAGNOSIS — I10 Essential (primary) hypertension: Secondary | ICD-10-CM | POA: Diagnosis not present

## 2023-02-02 DIAGNOSIS — E039 Hypothyroidism, unspecified: Secondary | ICD-10-CM | POA: Diagnosis not present

## 2023-02-02 DIAGNOSIS — E1159 Type 2 diabetes mellitus with other circulatory complications: Secondary | ICD-10-CM | POA: Diagnosis not present

## 2023-02-16 DIAGNOSIS — R972 Elevated prostate specific antigen [PSA]: Secondary | ICD-10-CM | POA: Diagnosis not present

## 2023-02-24 ENCOUNTER — Other Ambulatory Visit: Payer: Self-pay | Admitting: Nurse Practitioner

## 2023-02-24 DIAGNOSIS — R3912 Poor urinary stream: Secondary | ICD-10-CM | POA: Diagnosis not present

## 2023-02-24 DIAGNOSIS — R972 Elevated prostate specific antigen [PSA]: Secondary | ICD-10-CM | POA: Diagnosis not present

## 2023-02-24 DIAGNOSIS — N401 Enlarged prostate with lower urinary tract symptoms: Secondary | ICD-10-CM | POA: Diagnosis not present

## 2023-02-26 ENCOUNTER — Other Ambulatory Visit: Payer: Self-pay

## 2023-02-26 MED ORDER — VALSARTAN-HYDROCHLOROTHIAZIDE 160-12.5 MG PO TABS: 1.0000 | ORAL_TABLET | Freq: Every day | ORAL | 2 refills | Status: DC

## 2023-03-03 ENCOUNTER — Other Ambulatory Visit (HOSPITAL_COMMUNITY): Payer: Self-pay | Admitting: Nurse Practitioner

## 2023-03-03 DIAGNOSIS — R972 Elevated prostate specific antigen [PSA]: Secondary | ICD-10-CM

## 2023-03-05 ENCOUNTER — Emergency Department (HOSPITAL_COMMUNITY): Payer: Medicare PPO

## 2023-03-05 ENCOUNTER — Emergency Department (HOSPITAL_COMMUNITY)
Admission: EM | Admit: 2023-03-05 | Discharge: 2023-03-05 | Disposition: A | Discharge status: Home / Self Care | Payer: Medicare PPO | Attending: Emergency Medicine | Admitting: Emergency Medicine

## 2023-03-05 ENCOUNTER — Other Ambulatory Visit: Payer: Self-pay

## 2023-03-05 ENCOUNTER — Encounter (HOSPITAL_COMMUNITY): Payer: Self-pay | Admitting: *Deleted

## 2023-03-05 DIAGNOSIS — J984 Other disorders of lung: Secondary | ICD-10-CM | POA: Diagnosis not present

## 2023-03-05 DIAGNOSIS — R918 Other nonspecific abnormal finding of lung field: Secondary | ICD-10-CM | POA: Diagnosis not present

## 2023-03-05 DIAGNOSIS — I10 Essential (primary) hypertension: Secondary | ICD-10-CM | POA: Diagnosis not present

## 2023-03-05 DIAGNOSIS — Z7982 Long term (current) use of aspirin: Secondary | ICD-10-CM | POA: Insufficient documentation

## 2023-03-05 DIAGNOSIS — R051 Acute cough: Secondary | ICD-10-CM | POA: Insufficient documentation

## 2023-03-05 DIAGNOSIS — I700 Atherosclerosis of aorta: Secondary | ICD-10-CM | POA: Diagnosis not present

## 2023-03-05 DIAGNOSIS — N183 Chronic kidney disease, stage 3 unspecified: Secondary | ICD-10-CM | POA: Diagnosis not present

## 2023-03-05 DIAGNOSIS — Z7901 Long term (current) use of anticoagulants: Secondary | ICD-10-CM | POA: Diagnosis not present

## 2023-03-05 DIAGNOSIS — Z79899 Other long term (current) drug therapy: Secondary | ICD-10-CM | POA: Diagnosis not present

## 2023-03-05 DIAGNOSIS — E1129 Type 2 diabetes mellitus with other diabetic kidney complication: Secondary | ICD-10-CM | POA: Diagnosis not present

## 2023-03-05 DIAGNOSIS — R059 Cough, unspecified: Secondary | ICD-10-CM | POA: Diagnosis not present

## 2023-03-05 MED ORDER — AZITHROMYCIN 250 MG PO TABS: 250.0000 mg | ORAL_TABLET | Freq: Every day | ORAL | 0 refills | Status: DC

## 2023-03-05 NOTE — ED Notes (Signed)
Attempted to call phone number listed for pt twice , and it will ring a few times before going to voicemail

## 2023-03-05 NOTE — ED Provider Notes (Signed)
[place] EMERGENCY DEPARTMENT AT [facility] Provider Note   CSN: [id number] Arrival date & time: 03/05/23  1309     History Chief Complaint  Patient presents with   Cough    Kevin [name] is a 77 y.o. male with history of hypertension, hyperlipidemia, obesity who presents to the emergency department with cough.  Patient states he took his medications this morning normally and notes he forgot to take Benadryl.  He states after taking the Benadryl about 30 minutes later he had a very strong burning sensation in his throat and ended up coughing up half of the capsule of Benadryl.  Patient immediately tried swallowing it again and felt that it might of went down his windpipe.  He has not had any fever, nausea, vomiting, or diarrhea.  Denies chills.    Cough      Home Medications Prior to Admission medications   Medication Sig Start Date End Date Taking? Authorizing Provider  azithromycin (ZITHROMAX) 250 MG tablet Take 1 tablet (250 mg total) by mouth daily. Take first 2 tablets together, then 1 every day until finished. 03/05/23  Yes Meredeth Ide, Osaze Hubbert M, PA-C  Ascorbic Acid (VITAMIN C) 1000 MG tablet Take 1,000 mg by mouth daily.    [provider]  aspirin 81 MG tablet Take 81 mg by mouth daily.    [provider]  clopidogrel (PLAVIX) 75 MG tablet TAKE ONE TABLET BY MOUTH ONCE DAILY. 04/30/22   Runell Gess, MD  Coenzyme Q10 (CO Q 10) 10 MG CAPS Take 1 capsule by mouth daily.    [provider]  diphenhydrAMINE (BENADRYL) 25 MG tablet Take 25 mg by mouth every 6 (six) hours as needed.    [provider]  ezetimibe (ZETIA) 10 MG tablet Take 1 tablet (10 mg total) by mouth daily. 03/19/22   Runell Gess, MD  fluticasone (FLONASE) 50 MCG/ACT nasal spray Place 2 sprays into both nostrils daily.    [provider]  furosemide (LASIX) 40 MG tablet Take 40 mg by mouth daily.    [provider]  JANUVIA 100 MG tablet  Take 100 mg by mouth daily. 03/12/22   [provider]  levothyroxine (SYNTHROID) 50 MCG tablet Take 50 mcg by mouth daily before breakfast.    [provider]  metoprolol (TOPROL-XL) 100 MG 24 hr tablet Take 100 mg by mouth daily.    [provider]  niacin (NIASPAN) 1000 MG CR tablet TAKE 1 TABLET BY MOUTH AT BEDTIME. 01/30/23   Runell Gess, MD  pravastatin (PRAVACHOL) 40 MG tablet Take 1 tablet (40 mg total) by mouth every evening. 04/18/19   Runell Gess, MD  tamsulosin (FLOMAX) 0.4 MG CAPS capsule Take 0.4 mg by mouth daily. 07/17/20   [provider]  tirzepatide Greggory Keen) 5 MG/0.5ML Pen Inject 5 mg into the skin once a week. 07/18/22   [provider]  valsartan-hydrochlorothiazide (DIOVAN-HCT) 160-12.5 MG tablet Take 1 tablet by mouth daily. 02/26/23   Azalee Course, PA      Allergies    Irbesartan, Prednisone, Ramipril, Shellfish allergy, Tradjenta [linagliptin], Cephalexin, Clindamycin hcl, Clindamycin hcl, Codeine, Contrast media [iodinated contrast media], Crab extract, Fenofibrate, Gadolinium derivatives, Glycotrol [support-500], Indomethacin, Minocycline, Minocycline hcl, Morphine and codeine, Shrimp extract, Sulfa drugs cross reactors, Sulfamethoxazole-trimethoprim, Tetracyclines & related, and Tree extract    Review of Systems   Review of Systems  Respiratory:  Positive for cough.   All other systems reviewed and are negative.  Physical Exam Updated Vital Signs BP (!) 146/71 (BP Location: Right Arm)   Pulse 82   Temp 98.1 F (36.7 C) (Oral)   Resp 16   Ht 5' 9.5" (1.765 m)   Wt 102.1 kg   SpO2 96%   BMI 32.75 kg/m  Physical Exam Vitals and nursing note reviewed.  Constitutional:      General: He is not in acute distress.    Appearance: Normal appearance.  HENT:     Head: Normocephalic and atraumatic.  Eyes:     General:        Right eye: No discharge.        Left eye: No discharge.  Cardiovascular:      Comments: Regular rate and rhythm.  S1/S2 are distinct without any evidence of murmur, rubs, or gallops.  Radial pulses are 2+ bilaterally.  Dorsalis pedis pulses are 2+ bilaterally.  No evidence of pedal edema. Pulmonary:     Effort: Pulmonary effort is normal.     Breath sounds: Examination of the left-middle field reveals rhonchi. Examination of the left-lower field reveals rhonchi. Rhonchi present. No decreased breath sounds.     Comments: Diffuse wheezing. Abdominal:     General: Abdomen is flat. Bowel sounds are normal. There is no distension.     Tenderness: There is no abdominal tenderness. There is no guarding or rebound.  Musculoskeletal:        General: Normal range of motion.     Cervical back: Neck supple.  Skin:    General: Skin is warm and dry.     Findings: No rash.  Neurological:     General: No focal deficit present.     Mental Status: He is alert.  Psychiatric:        Mood and Affect: Mood normal.        Behavior: Behavior normal.     ED Results / Procedures / Treatments   Labs (all labs ordered are listed, but only abnormal results are displayed) Labs Reviewed - No data to display  EKG None  Radiology DG Chest Portable 1 View  Result Date: 03/05/2023 CLINICAL DATA:  Provided history: Cough.  Possible aspiration. EXAM: PORTABLE CHEST 1 VIEW COMPARISON:  CT abdomen/pelvis 10/23/2022. Prior chest radiographs 03/20/2022 and earlier. FINDINGS: Heart size within normal limits. Aortic atherosclerosis. Linear opacities at the both lung bases (left greater than right) which may reflect atelectasis and/or scarring. No appreciable airspace consolidation. No evidence of pleural effusion or pneumothorax. No acute osseous abnormality identified. Degenerative changes of the spine and bilateral glenohumeral joints. IMPRESSION: 1. Linear opacities at both lung bases, which may reflect atelectasis and/or scarring. 2. Otherwise, no evidence of an acute cardiopulmonary abnormality.  3. Aortic Atherosclerosis (ICD10-I70.0). Electronically Signed   By: Jackey Loge D.O.   On: 03/05/2023 15:44    Procedures Procedures    Medications Ordered in ED Medications - No data to display  ED Course/ Medical Decision Making/ A&P Clinical Course as of 03/05/23 1626  Thu Mar 05, 2023  1559 DG Chest Portable 1 View Personally ordered interpreted the study and do not see any evidence of pneumonia.  I do agree with the radiologist interpretation. [CF]  1559 Patient currently does not have any clinical indications of aspiration pneumonia.  I will likely give him a prescription of antibiotics that he can start taking if he starts to experience worsening cough, fever, etc. [CF]    Clinical Course User Index [CF] Teressa Lower, PA-C   {  Click here for ABCD2, HEART and other calculators  Medical Decision Making ALVA BROXSON is a 77 y.o. male patient who presents to the emergency department today for further evaluation of cough.  I am concerned for possible aspiration pneumonia.  Will get chest x-ray to further assess.  Patient is oxygenating well on room air approximately 100%.  His vital signs are otherwise stable.  Chest x-ray was normal.  This could be that it is still early concern that he did have this aspiration episode earlier this morning.  I will go ahead and send azithromycin to his pharmacy preemptively that he can have in the event that he starts getting worse.  Patient agreeable with plan.  Strict return precautions were discussed.  He is safe for discharge.   Amount and/or Complexity of Data Reviewed Radiology: ordered. Decision-making details documented in ED Course.  Risk Prescription drug management.    Final Clinical Impression(s) / ED Diagnoses Final diagnoses:  Acute cough    Rx / DC Orders ED Discharge Orders          Ordered    azithromycin (ZITHROMAX) 250 MG tablet  Daily        03/05/23 1618              Honor Loh [place],  [place] 03/05/23 1626    Edwin Dada P, DO 03/06/23 2254

## 2023-03-05 NOTE — ED Notes (Addendum)
Called Pts Niece, Mearl Latin at [phone] to ask his whereabouts and left a voicemail. Called his daughter Wilner Laurich, at the number listed, [phone] but it is disconnected.

## 2023-03-05 NOTE — ED Notes (Signed)
Talked to [place] from [facility] and pt has not arrived there and has called family, but unable to locate his current location.

## 2023-03-05 NOTE — ED Notes (Addendum)
[employer] called to be on the lookout for pt, but pt does not have a Hx of dementia or a legal guardian, so pt has the right to leave. AC aware, risk management aware and [employer] contacted

## 2023-03-05 NOTE — ED Notes (Addendum)
Per the [employer], pt lives on the independent side, so they do not have to take discharge report, nor did they know he was at the hospital. According to triage notes, pt came via [facility]. He did not drive himself here. Attempting to get in contact with pt himself, but call went to voicemail. Per the RN who had this pt, Karis Juba, when he went into Rm [address] to go over discharge papers with pt, pt had left, pt is not in the waiting room, nor in any of the ED bathrooms

## 2023-03-05 NOTE — ED Notes (Addendum)
Called security to look at cameras to see where this pt went, security stated they are in the proccess of shift change and will hand it off to oncoming shift. Also unable to get in touch with emergency contacts

## 2023-03-05 NOTE — ED Notes (Addendum)
Went to go discharge pt and they were not in the room. Have looked everywhere on the floor and called the [facility]. The [facility] said they did not know he had come to the hospital, and since he lives in the independent resident living, he may have driven himself to the ED and driven himself home.

## 2023-03-05 NOTE — ED Triage Notes (Signed)
Pt BIB [facility] for c/o cough and possible aspiration of one of his medications  Pt states he took a benadryl this am without swallowing water with it and since then has been coughing and sounds "raspy" on left side of chest

## 2023-03-05 NOTE — Discharge Instructions (Addendum)
As we discussed, there was no evidence of pneumonia on your chest x-ray.  This could be that it is still early.  I have sent antibiotics to your pharmacy like we discussed.  If you start experiencing any the red flag symptoms that we talked about today you can go ahead and start the antibiotic treatment.  I would like for you to follow-up with your primary care doctor in 1 week to ensure we have good follow-up.  You may return to the emergency department for any worsening symptoms.

## 2023-03-18 DIAGNOSIS — Z08 Encounter for follow-up examination after completed treatment for malignant neoplasm: Secondary | ICD-10-CM | POA: Diagnosis not present

## 2023-03-18 DIAGNOSIS — L910 Hypertrophic scar: Secondary | ICD-10-CM | POA: Diagnosis not present

## 2023-03-18 DIAGNOSIS — D485 Neoplasm of uncertain behavior of skin: Secondary | ICD-10-CM | POA: Diagnosis not present

## 2023-03-18 DIAGNOSIS — Z85820 Personal history of malignant melanoma of skin: Secondary | ICD-10-CM | POA: Diagnosis not present

## 2023-03-18 DIAGNOSIS — Z1283 Encounter for screening for malignant neoplasm of skin: Secondary | ICD-10-CM | POA: Diagnosis not present

## 2023-03-18 DIAGNOSIS — D2272 Melanocytic nevi of left lower limb, including hip: Secondary | ICD-10-CM | POA: Diagnosis not present

## 2023-03-18 DIAGNOSIS — D225 Melanocytic nevi of trunk: Secondary | ICD-10-CM | POA: Diagnosis not present

## 2023-03-18 DIAGNOSIS — C44319 Basal cell carcinoma of skin of other parts of face: Secondary | ICD-10-CM | POA: Diagnosis not present

## 2023-03-20 ENCOUNTER — Other Ambulatory Visit (HOSPITAL_COMMUNITY): Payer: Self-pay | Admitting: Nurse Practitioner

## 2023-03-20 ENCOUNTER — Encounter (HOSPITAL_COMMUNITY): Payer: Self-pay

## 2023-03-20 ENCOUNTER — Ambulatory Visit (HOSPITAL_COMMUNITY)
Admission: RE | Admit: 2023-03-20 | Discharge: 2023-03-20 | Disposition: A | Discharge status: Home / Self Care | Payer: Medicare PPO | Source: Ambulatory Visit | Attending: Nurse Practitioner | Admitting: Nurse Practitioner

## 2023-03-20 DIAGNOSIS — R972 Elevated prostate specific antigen [PSA]: Secondary | ICD-10-CM | POA: Diagnosis not present

## 2023-03-20 DIAGNOSIS — T83490A Other mechanical complication of penile (implanted) prosthesis, initial encounter: Secondary | ICD-10-CM | POA: Diagnosis not present

## 2023-03-20 NOTE — Progress Notes (Signed)
Patient came for MRI Prostate.  Pelvis xray taken of to check for metal in penile implant. Per Dr Reche Dixon because implant was placed in the 90's we will need a model number to verify if It is conditional or not.  Patients implant placed Per care everywhere note in 1998.  Surgery Done at [facility].

## 2023-04-01 ENCOUNTER — Other Ambulatory Visit: Payer: Self-pay | Admitting: Nurse Practitioner

## 2023-04-01 DIAGNOSIS — R972 Elevated prostate specific antigen [PSA]: Secondary | ICD-10-CM

## 2023-04-04 DIAGNOSIS — E1129 Type 2 diabetes mellitus with other diabetic kidney complication: Secondary | ICD-10-CM | POA: Diagnosis not present

## 2023-04-04 DIAGNOSIS — N183 Chronic kidney disease, stage 3 unspecified: Secondary | ICD-10-CM | POA: Diagnosis not present

## 2023-04-23 ENCOUNTER — Other Ambulatory Visit: Payer: Self-pay | Admitting: Cardiovascular Disease

## 2023-05-05 DIAGNOSIS — E1159 Type 2 diabetes mellitus with other circulatory complications: Secondary | ICD-10-CM | POA: Diagnosis not present

## 2023-05-05 DIAGNOSIS — I10 Essential (primary) hypertension: Secondary | ICD-10-CM | POA: Diagnosis not present

## 2023-05-07 DIAGNOSIS — Z6835 Body mass index (BMI) 35.0-35.9, adult: Secondary | ICD-10-CM | POA: Diagnosis not present

## 2023-05-07 DIAGNOSIS — I10 Essential (primary) hypertension: Secondary | ICD-10-CM | POA: Diagnosis not present

## 2023-05-07 DIAGNOSIS — K5150 Left sided colitis without complications: Secondary | ICD-10-CM | POA: Diagnosis not present

## 2023-05-07 DIAGNOSIS — N183 Chronic kidney disease, stage 3 unspecified: Secondary | ICD-10-CM | POA: Diagnosis not present

## 2023-05-07 DIAGNOSIS — E6609 Other obesity due to excess calories: Secondary | ICD-10-CM | POA: Diagnosis not present

## 2023-05-07 DIAGNOSIS — E782 Mixed hyperlipidemia: Secondary | ICD-10-CM | POA: Diagnosis not present

## 2023-05-07 DIAGNOSIS — E1159 Type 2 diabetes mellitus with other circulatory complications: Secondary | ICD-10-CM | POA: Diagnosis not present

## 2023-05-07 DIAGNOSIS — E039 Hypothyroidism, unspecified: Secondary | ICD-10-CM | POA: Diagnosis not present

## 2023-05-08 DIAGNOSIS — N401 Enlarged prostate with lower urinary tract symptoms: Secondary | ICD-10-CM | POA: Diagnosis not present

## 2023-05-08 DIAGNOSIS — R972 Elevated prostate specific antigen [PSA]: Secondary | ICD-10-CM | POA: Diagnosis not present

## 2023-05-08 DIAGNOSIS — R3912 Poor urinary stream: Secondary | ICD-10-CM | POA: Diagnosis not present

## 2023-05-12 ENCOUNTER — Telehealth: Payer: Self-pay | Admitting: *Deleted

## 2023-05-12 NOTE — Telephone Encounter (Signed)
 Patient was cleared for hernia repair by Scot Ford, PA on 12/16/22. I called the patient to assess for any changes since that time but was unable to leave a message.   Per office protocol and pending no concerning cardiac symptoms, he may hold Plavix  for 5-7 days prior to procedure and should resume as soon as hemodynamically stable postoperatively.  Rosaline EMERSON Bane, NP-C  05/12/2023, 4:25 PM [address], [address] Office [phone] Fax [phone]

## 2023-05-12 NOTE — Telephone Encounter (Signed)
 Kevin [name] called back from [facility]. They are using local anesthesia.

## 2023-05-12 NOTE — Telephone Encounter (Signed)
   Pre-operative Risk Assessment    Patient Name: Kevin [name]  DOB: 08/31/1945 MRN: [id number]   Date of last office visit: 12/16/2022  Date of next office visit: N/A   Request for Surgical Clearance    Procedure:   PROSTATE BX  Date of Surgery:  Clearance TBD                                Surgeon:  DR. SHANE Surgeon's Group or Practice Name:  [facility] Phone number:  [phone] Fax number:  [phone]   Type of Clearance Requested:   - Medical  - Pharmacy:  Hold Aspirin  and Clopidogrel  (Plavix ) X'S 5 DAYS   Type of Anesthesia:  Not Indicated LEFT A MESSAGE FOR SURGEON'S OFFICE TO CALL BACK WITH    Additional requests/questions:    [facility]   05/12/2023, 8:54 AM

## 2023-05-13 DIAGNOSIS — Z85828 Personal history of other malignant neoplasm of skin: Secondary | ICD-10-CM | POA: Diagnosis not present

## 2023-05-13 DIAGNOSIS — C44311 Basal cell carcinoma of skin of nose: Secondary | ICD-10-CM | POA: Diagnosis not present

## 2023-05-13 DIAGNOSIS — Z08 Encounter for follow-up examination after completed treatment for malignant neoplasm: Secondary | ICD-10-CM | POA: Diagnosis not present

## 2023-05-13 NOTE — Telephone Encounter (Signed)
 Tried to call patient again, no answer, and does not ever go to voicemail. I will send a MyChart message requesting patient call us. If this is unsuccessful will need to have patients' contacts reached to have him call us.

## 2023-05-13 NOTE — Telephone Encounter (Signed)
 This patient has CAD s/p remote LAD and left circumflex stenting in 2001 and 2004 then last cath 2016 showing LAD stent had diffuse in-stent restenosis and 95% distal edge stenosis that was treated with DES. He also had ostial diagonal lesion that was jailed and 75% OM1 lesion as well as occluded nondominant RCA managed medically. Though not meeting criteria to hold antiplatelet through periop DAPT algorithm based on prior stenting anatomy, Dr. Court did previously grant clearance for patient to stop Plavix  in 10/2021 phone notes therefore the same interruption can be applied here. However, we are also asked to hold ASA for this biopsy. This was not previously granted in previous notes therefore need input from Dr. Court on holding both ASA plus Plavix  if surgeon feels bleeding risk too high to perform on ASA alone. Dr. Court - Please route response to P CV DIV PREOP (the pre-op pool). Thank you.

## 2023-06-05 DIAGNOSIS — R972 Elevated prostate specific antigen [PSA]: Secondary | ICD-10-CM | POA: Diagnosis not present

## 2023-06-05 NOTE — Telephone Encounter (Signed)
   Name: Kevin [name]  DOB: 02/21/1946  MRN: [id number]  Primary Cardiologist: Nanetta Batty, MD   Preoperative team, please contact this patient and set up a phone call appointment for further preoperative risk assessment. Please obtain consent and complete medication review. Thank you for your help.  I confirm that guidance regarding antiplatelet and oral anticoagulation therapy has been completed and, if necessary, noted below.  Per office protocol and pending no concerning cardiac symptoms, he may hold Plavix for 5-7 days prior to procedure and should resume as soon as hemodynamically stable postoperatively.  If necessary his aspirin also may be held for 5 to 7 days prior to his procedure.  I also confirmed the patient resides in the state of [place]. As per [facility] Medical Board telemedicine laws, the patient must reside in the state in which the provider is licensed.   Ronney Asters, NP 06/05/2023, 11:40 AM [facility]

## 2023-06-05 NOTE — Telephone Encounter (Signed)
Pt has appt with Dr. Allyson Sabal 06/24/23. I will follow up with preop APP if tele appt still needed as pt has upcoming appt. Our 1st tele visit is not until 06/19/23. If ok to defer clearance to appt with DR. Allyson Sabal will need to add preop clearance to appt notes .   06/24/23 appt is also a 6 mnth f/u with Dr. Allyson Sabal.

## 2023-06-08 NOTE — Telephone Encounter (Signed)
Pt scheduled to see Dr. Allyson Sabal, 06/24/23, clearance will be addressed at that time.  Will route to the requesting surgeon's office to make them aware.

## 2023-06-12 NOTE — Progress Notes (Signed)
 GI Office Note    Referring Provider: Minus Amel, MD Primary Care Physician:  Minus Amel, MD Primary Gastroenterologist: Windsor Hatcher.[name], MD  Date:  06/15/2023  ID:  Kevin [name], DOB 02-Jun-1945, MRN [id number]   Chief Complaint   Chief Complaint  Patient presents with   Diarrhea   History of Present Illness  Kevin [name] is a 78 y.o. male with a history of asthma, CAD s/p stents in 2009, diabetes, HTN, HLD, melanoma, microscopic colitis, OSA not using CPAP, Lap-Band surgery presenting today with complaint of diarrhea for 3 weeks.   Colonoscopy June 2012:  -Normal rectum, long redundant colon, pancolonic diverticula,  -6 mm cecal polyp, diminutive polyp at the appendiceal orifice ablated with tip of hot snare, biopsies taken (biopsy suggestive of microscopic colitis; polyp benign; small bowel ok)   Last EGD June 2012:  -Normal esophagus,  -Lap-Band present, second portion of duodenum with erosions without frank ulcer   Last seen in the office 08/22/2021: No alarm symptoms other than some occasional audible abdominal sounds.  He had been to the ED with intermittent stomach ache and had an x-ray and told he had a possible small bowel obstruction and he rested his bowels for 2 weeks and then had resolution of symptoms.  He denies any chest pain or shortness of breath.  He reported he was following a consistent carb diet given he had a recently elevated A1c, and actually lost about 6 pounds in 1 week to 2 weeks.   He was admitted to the hospital 09/10/2021 - 09/23/2021 for abdominal pain with nausea and abdominal distention.  He was found to have a mid small bowel obstruction.  He was initially treated medically.  CT on 5/16 with ongoing small bowel dilation and transition zone and noted twisting of the mesenteric structures with possibility of closed-loop obstruction secondary to malrotation.  He had exploratory laparotomy on 5/19 with reduction of volvulus, lysis of adhesions, removal  of his gastric band.   Patient had multiple ED visits since 9/19 due to abdominal pain and nausea.  He has had multiple CT scans of the abdomen with no evidence of bowel obstruction or other acute abnormality.  Changes are consistent with constipation.   CT A/P non contrast 01/28/22: IMPRESSION: -Large amount of stool seen throughout the colon particularly in the rectum. -There is no evidence of bowel obstruction or inflammation.  -Sigmoid diverticulosis without inflammation. -Mild prostatic enlargement  Last office visit 02/04/22.  Previously stated that MiraLAX  for constipation made his kidneys hurt.  Use a stool softener and magnesium citrate to have bowel movements after his surgery.  With the abdominal pain and burning he tried some famotidine .  Had been having some nausea and dry heaves at that time.  Pain usually improves with eating.  Reported an area of poking out of his belly that was not hard when he stands.  This was concerning for him.  Always has lower extremity edema.  Denied chest pain shortness of breath, or dysphagia. Scheduled for EGD and colonoscopy. Continue famotidine  20 mg BID. Continue colace daily, twice daily if needed.   Cancelled colonoscopy in November 2023 - patient diagnosed with COVID.   Today:  Ever since surgery for volvulus he was constipated and had stool softeners and laxatives and going every other day. Stools still hard no matter what he took.   Has had COVID and shoulder surgery since his last.   Since his last visit he has been on Mounjaro and does  great for his diabetes (last A1c 5.9). First started taking it he was very sick. Feels like the Mounjaro has been present for 1 year.   Stools have been somewhat stringy recently. Only started out some watery diarrhea for a few days. No hard stools recently. If he is eating he can have 6-7 a day. Sometimes just gas and sometimes has a small amount of stringy stool. No melena or brbpr. Appetite is fine with the  medication. On average eats one meal and then a snack. Weight has been stable. Takes vitamin C, dandelion and milk thistle and CoQ10 - licopene, vitamin B and D every other day.  Stool is slightly darker but not super dark.  Does also report some mucus in his stool at times.  Has steroids sitting at home given by PCP but has not yet taken.   He reports he still has the hernia and needs that taken care of - not causing him pain.   Supposed to have seen general surgery but givne his PSA was high he needed to go see urology.   Wt Readings from Last 3 Encounters:  06/15/23 225 lb 9.6 oz (102.3 kg)  03/05/23 225 lb (102.1 kg)  01/20/23 226 lb (102.5 kg)    Current Outpatient Medications  Medication Sig Dispense Refill   Ascorbic Acid (VITAMIN C) 1000 MG tablet Take 1,000 mg by mouth daily.     aspirin  81 MG tablet Take 81 mg by mouth daily.     clopidogrel  (PLAVIX ) 75 MG tablet TAKE ONE TABLET BY MOUTH ONCE DAILY. 90 tablet 2   Coenzyme Q10 (CO Q 10) 10 MG CAPS Take 1 capsule by mouth daily.     diphenhydrAMINE  (BENADRYL ) 25 MG tablet Take 25 mg by mouth every 6 (six) hours as needed.     ezetimibe  (ZETIA ) 10 MG tablet Take 1 tablet (10 mg total) by mouth daily. 90 tablet 3   fluticasone  (FLONASE ) 50 MCG/ACT nasal spray Place 2 sprays into both nostrils daily.     furosemide  (LASIX ) 40 MG tablet Take 40 mg by mouth daily.     JANUVIA 100 MG tablet Take 100 mg by mouth daily.     levothyroxine  (SYNTHROID ) 50 MCG tablet Take 50 mcg by mouth daily before breakfast.     metoprolol  (TOPROL -XL) 100 MG 24 hr tablet Take 100 mg by mouth daily.     niacin  (NIASPAN ) 1000 MG CR tablet TAKE 1 TABLET BY MOUTH AT BEDTIME. 90 tablet 3   pravastatin  (PRAVACHOL ) 40 MG tablet Take 1 tablet (40 mg total) by mouth every evening. 90 tablet 3   tamsulosin  (FLOMAX ) 0.4 MG CAPS capsule Take 0.4 mg by mouth daily.     tirzepatide (MOUNJARO) 5 MG/0.5ML Pen Inject 5 mg into the skin once a week.      valsartan -hydrochlorothiazide  (DIOVAN -HCT) 160-12.5 MG tablet Take 1 tablet by mouth daily. 90 tablet 2   No current facility-administered medications for this visit.    Past Medical History:  Diagnosis Date   Angio-edema    Asthma    as a child. no problems now.    CAD (coronary artery disease)    Cellulitis 5/11   left leg   Diabetes mellitus    Diverticulosis    Diverticulosis    HTN (hypertension)    Hyperlipidemia    Melanoma (HCC) 10/11   left foot   Melanoma of foot (HCC) 06/02/2011   Microscopic colitis 10/09/10   Colonoscopy dr Riley Cheadle   Obesity  OSA on CPAP 01/20/2014   Psoriasis    S/P colonoscopy 07/03/03    Dr [name]->hyperplastic rectal polyp   S/P endoscopy 10/09/10   lap band intact, duodenal erosions   Seasonal allergies    Sleep apnea    Urticaria     Past Surgical History:  Procedure Laterality Date   ABDOMINAL SURGERY     ADENOIDECTOMY     APPENDECTOMY     CARDIAC CATHETERIZATION N/A 02/14/2015   Procedure: Left Heart Cath and Coronary Angiography;  Surgeon: Lucendia Rusk, MD;  Location: [facility] INVASIVE CV LAB;  Service: Cardiovascular;  Laterality: N/A;   CARDIAC CATHETERIZATION N/A 02/14/2015   Procedure: Coronary Stent Intervention;  Surgeon: Lucendia Rusk, MD;  Location: [facility] INVASIVE CV LAB;  Service: Cardiovascular;  Laterality: N/A;   COLONOSCOPY N/A 10/09/2010   normal rectum, long redundant colon, pancolonic diverticula, 6mm cecal polyp, diminutive polyp at the appendiceal orifice ablated with tip of hot snare (Biopsies suggestive of microscopic colitis; polyp benign; small bowel ok)   CORONARY ANGIOPLASTY  2002/2005/2008   CORONARY ANGIOPLASTY WITH STENT PLACEMENT  06/11/2007   PTCA & stenting distal CX, PTCA & balloon angioplasty in-stent restenotic mid CX coronary artery stent   ESOPHAGOGASTRODUODENOSCOPY N/A 10/09/2010   Normal esophagus, lap band present, 2nd portion duodenum with erosions without frank ulcer   GASTRIC BANDING PORT  REVISION N/A 09/20/2021   Procedure: GASTRIC BAND REMOVAL;  Surgeon: Awilda Bogus, MD;  Location: [facility];  Service: General;  Laterality: N/A;   KNEE SURGERY     left   LAPAROTOMY N/A 09/20/2021   Procedure: EXPLORATORY LAPAROTOMY,  LYSIS OF ADHESIONS, REDUCTION OF VOLVULUS;  Surgeon: Awilda Bogus, MD;  Location: [facility];  Service: General;  Laterality: N/A;   LEFT HEART CATHETERIZATION WITH CORONARY ANGIOGRAM N/A 07/15/2013   Procedure: LEFT HEART CATHETERIZATION WITH CORONARY ANGIOGRAM;  Surgeon: Mickiel Albany, MD;  Location: [facility] CATH LAB;  Service: Cardiovascular;  Laterality: N/A;   NM MYOCAR PERF WALL MOTION  06/24/2011   small mild fixed anteroseptal & anteroapical defects.   PENILE PROSTHESIS IMPLANT     SHOULDER SURGERY     left   SINOSCOPY     TONSILLECTOMY     TYMPANOSTOMY TUBE PLACEMENT      Family History  Problem Relation Age of Onset   Coronary artery disease Mother    Diabetes Father    Liver cancer Brother 43   Celiac disease Other 61       brother's daughter   Allergic rhinitis Neg Hx    Angioedema Neg Hx    Asthma Neg Hx    Atopy Neg Hx    Eczema Neg Hx    Immunodeficiency Neg Hx    Urticaria Neg Hx     Allergies as of 06/15/2023 - Review Complete 06/15/2023  Allergen Reaction Noted   Irbesartan Shortness Of Breath and Other (See Comments) 10/01/2010   Prednisone  Anaphylaxis, Swelling, and Other (See Comments) 10/01/2010   Ramipril Shortness Of Breath and Other (See Comments) 07/15/2013   Shellfish allergy  Anaphylaxis 07/15/2013   Tradjenta [linagliptin] Shortness Of Breath 09/02/2013   Cephalexin Hives and Swelling 10/01/2010   Clindamycin hcl Hives and Swelling 10/01/2010   Clindamycin hcl Other (See Comments) 02/12/2022   Codeine Nausea And Vomiting and Other (See Comments) 10/01/2010   Contrast media [iodinated contrast media]  11/15/2018   Crab extract Other (See Comments) 02/12/2022   Fenofibrate Other (See Comments) 07/15/2013    Glycotrol [support-500] Other (See  Comments) 07/15/2013   Indomethacin Other (See Comments) 10/01/2010   Minocycline Other (See Comments) 02/12/2022   Minocycline hcl Hives and Swelling 10/01/2010   Morphine and codeine Nausea And Vomiting 10/01/2010   Shrimp extract Other (See Comments) 02/12/2022   Sulfa drugs cross reactors Hives and Swelling 10/01/2010   Sulfamethoxazole-trimethoprim Other (See Comments) 02/12/2022   Tetracyclines & related  10/01/2010   Tree extract Itching 11/15/2018    Social History   Socioeconomic History   Marital status: Divorced    Spouse name: Not on file   Number of children: 1   Years of education: 12   Highest education level: 12th grade  Occupational History   Occupation: Manufacturing engineer: [employer]    Comment: [place]  Tobacco Use   Smoking status: Former    Current packs/day: 0.00    Average packs/day: 3.0 packs/day for 30.0 years (89.9 ttl pk-yrs)    Types: Cigarettes    Start date: 11/18/1964    Quit date: 10/04/1994    Years since quitting: 28.7    Passive exposure: Past   Smokeless tobacco: Never   Tobacco comments:    quit smoking 19 years ago  Vaping Use   Vaping status: Never Used  Substance and Sexual Activity   Alcohol use: Not Currently    Comment: 3-4 times/year   Drug use: No   Sexual activity: Not Currently    Partners: Female  Other Topics Concern   Not on file  Social History Narrative   Not on file   Social Drivers of Health   Financial Resource Strain: Low Risk  (08/05/2022)   Overall Financial Resource Strain (CARDIA)    Difficulty of Paying Living Expenses: Not hard at all  Food Insecurity: No Food Insecurity (08/05/2022)   Hunger Vital Sign    Worried About Running Out of Food in the Last Year: Never true    Ran Out of Food in the Last Year: Never true  Transportation Needs: No Transportation Needs (08/05/2022)   PRAPARE - Administrator, Civil Service (Medical): No    Lack of  Transportation (Non-Medical): No  Physical Activity: Inactive (08/05/2022)   Exercise Vital Sign    Days of Exercise per Week: 0 days    Minutes of Exercise per Session: 0 min  Stress: No Stress Concern Present (08/05/2022)   [employer] of Occupational Health - Occupational Stress Questionnaire    Feeling of Stress : Only a little  Social Connections: Moderately Integrated (08/05/2022)   Social Connection and Isolation Panel [NHANES]    Frequency of Communication with Friends and Family: More than three times a week    Frequency of Social Gatherings with Friends and Family: More than three times a week    Attends Religious Services: More than 4 times per year    Active Member of [employer] or Organizations: Yes    Attends Engineer, structural: More than 4 times per year    Marital Status: Divorced     Review of Systems   Gen: Denies fever, chills, anorexia. Denies fatigue, weakness, weight loss.  CV: Denies chest pain, palpitations, syncope, peripheral edema, and claudication. Resp: Denies dyspnea at rest, cough, wheezing, coughing up blood, and pleurisy. GI: See HPI Derm: Denies rash, itching, dry skin Psych: Denies depression, anxiety, memory loss, confusion. No homicidal or suicidal ideation.  Heme: Denies bruising, bleeding, and enlarged lymph nodes.  Physical Exam   BP (!) 146/85 (BP Location: Right  Arm, Patient Position: Sitting, Cuff Size: Normal) Comment (BP Location): Taken on forearm due to clothing  Pulse 71   Temp (!) 97.5 F (36.4 C) (Oral)   Ht 5' 8.5" (1.74 m)   Wt 225 lb 9.6 oz (102.3 kg)   SpO2 99%   BMI 33.80 kg/m   General:   Alert and oriented. No distress noted. Pleasant and cooperative.  Head:  Normocephalic and atraumatic. Eyes:  Conjuctiva clear without scleral icterus. Mouth:  Oral mucosa pink and moist. Good dentition. No lesions. Lungs:  Clear to auscultation bilaterally. No wheezes, rales, or rhonchi. No distress.  Heart:  S1, S2 present  without murmurs appreciated.  Abdomen:  +BS, soft, non-tender and non-distended. No rebound or guarding.  Large ventral hernia present, soft and reducible. Rectal: deferred Msk:  Symmetrical without gross deformities. Normal posture. Extremities:  Without edema. Neurologic:  Alert and  oriented x4 Psych:  Alert and cooperative. Normal mood and affect.  Assessment  Kevin [name] is a 78 y.o. male with a history of asthma, CAD s/p stents in 2009, diabetes, HTN, HLD, melanoma, microscopic colitis, OSA not using CPAP, Lap-Band surgery presenting today with complaint of diarrhea.  History of colon polyps: Last colonoscopy in 2012 with benign polyps in the cecum and appendiceal orifice.  Has had a change in bowel habits as noted below.  Will proceed with scheduling colonoscopy after he found someone to accompany him for the procedure.  Weight stable.  Denies any melena or BRBPR.  Diarrhea, change in bowel habits, history of microscopic colitis: Has had some diarrhea initially starting a few weeks prior, since then stools have been more stringy and at times having frequent bowel movements especially after eating.  Stools have been softer than his usual hard stools and his baseline was bowel movement every other day.  He does have a history of microscopic colitis on prior colonoscopy in 2012.  Unclear if this is a recurrence, PCP did prescribe him some steroids however he has not yet taking it, he decided he wanted to reach out to GI first.  Given he is also due for colonoscopy for his history of colon polyps, I recommended us  proceeding with this so we can take biopsies.  I would prefer to rule out infection as source of change in bowel habits and diarrhea prior to repeating course of steroids.  Unable to rule out pancreatic insufficiency given postprandial component to stools, will assess with pancreatic elastase.  Also given his history of microscopic colitis, will check fecal calprotectin and CRP as well.   Gallbladder remains in situ as well however this could be contributing factor as well.  GERD: Fairly well-controlled.  No episodes of nausea, vomiting, or dysphagia.  Fatigue: Has been complaining of some fatigue recently.  Labs in September with ongoing evidence of mild CKD.  Does have mild anemia.  Will recheck CBC as well as iron panel and thyroid  function given his fatigue  PLAN   Proceed with colonoscopy with propofol  by Dr. Riley Cheadle in near future: the risks, benefits, and alternatives have been discussed with the patient in detail. The patient states understanding and desires to proceed. ASA 3 (patient will reach out to schedule once he finds someone to accompany him) Cardiology clearance Hold Januvia morning of procedure Hold Mounjaro for 1 week, procedure on day 8 or after. TriLyte  prep Fecal cal, crp, CBC, CMP, iron panel, pancreatic elastase, Cdiff, GI path, TSH Could consider repeat course of Entocort if stool studies negative and  colonoscopy negative. Diarrhea education provided.  Follow up in 3 months.     Julian Obey, MSN, FNP-BC, AGACNP-BC [facility]

## 2023-06-15 ENCOUNTER — Encounter: Payer: Self-pay | Admitting: Gastroenterology

## 2023-06-15 ENCOUNTER — Ambulatory Visit: Payer: Medicare PPO | Admitting: Gastroenterology

## 2023-06-15 ENCOUNTER — Telehealth: Payer: Self-pay | Admitting: *Deleted

## 2023-06-15 VITALS — BP 146/85 | HR 71 | Temp 97.5°F | Ht 68.5 in | Wt 225.6 lb

## 2023-06-15 DIAGNOSIS — Z8719 Personal history of other diseases of the digestive system: Secondary | ICD-10-CM

## 2023-06-15 DIAGNOSIS — Z860100 Personal history of colon polyps, unspecified: Secondary | ICD-10-CM | POA: Diagnosis not present

## 2023-06-15 DIAGNOSIS — K219 Gastro-esophageal reflux disease without esophagitis: Secondary | ICD-10-CM | POA: Diagnosis not present

## 2023-06-15 DIAGNOSIS — R5383 Other fatigue: Secondary | ICD-10-CM

## 2023-06-15 DIAGNOSIS — R194 Change in bowel habit: Secondary | ICD-10-CM | POA: Diagnosis not present

## 2023-06-15 DIAGNOSIS — R197 Diarrhea, unspecified: Secondary | ICD-10-CM

## 2023-06-15 NOTE — Telephone Encounter (Signed)
  Request for patient to stop medication prior to procedure or is needing cleareance  06/15/23  Kevin [name] 02-26-1946  What type of surgery is being performed? COLONOSCOPY  When is surgery scheduled? TBD  What type of clearance is required (medical or pharmacy to hold medication or both? MEDICAL  Name of physician performing surgery?  Dr. Loa Riling [facility]enterology at [facility] Phone: [phone] Fax: [phone]  Anethesia type (none, local, MAC, general)? MAC

## 2023-06-15 NOTE — Patient Instructions (Addendum)
 We are scheduling you for a colonoscopy in the near future with Dr. Riley Cheadle.  Please talk with any friends or potential family members that can accompany you for the procedure.  We are booking out to the end of March anyways right now and we do not yet have April schedule.  Please have blood work and stool studies completed at [facility].  We will call you with results once they have been received. Please allow 3-5 business days for review. 2 locations for [facility] in [place]:              1. [address], [place]              2. [address] [place], [place]    Follow up in 3 months.   It was a pleasure to see you today. I want to create trusting relationships with patients. If you receive a survey regarding your visit,  I greatly appreciate you taking time to fill this out on paper or through your MyChart. I value your feedback.  Julian Obey, MSN, FNP-BC, AGACNP-BC [facility]

## 2023-06-15 NOTE — Telephone Encounter (Signed)
 Primary Cardiologist:[name], MD  Chart reviewed as part of pre-operative protocol coverage. Because of Kevin [name]'s past medical history and time since last visit, he/she will require a follow-up visit in order to better assess preoperative cardiovascular risk.  Pre-op covering staff: - Patient has an appointment on 2/19 with Dr. Katheryne Pane at which time clearance will be addressed. Appointment notes have been updated.  - Please contact requesting surgeon's office via preferred method (i.e, phone, fax) to inform them of need for appointment prior to surgery.  Per chart review, Dr. Katheryne Pane has previously granted permission for patient to hold anti-platelet therapy for prostate biopsy.   [name], NP-C  06/15/2023, 4:34 PM [address] Office [phone] Fax [phone]

## 2023-06-23 DIAGNOSIS — Z85828 Personal history of other malignant neoplasm of skin: Secondary | ICD-10-CM | POA: Diagnosis not present

## 2023-06-23 DIAGNOSIS — Z08 Encounter for follow-up examination after completed treatment for malignant neoplasm: Secondary | ICD-10-CM | POA: Diagnosis not present

## 2023-06-23 DIAGNOSIS — Z85820 Personal history of malignant melanoma of skin: Secondary | ICD-10-CM | POA: Diagnosis not present

## 2023-06-24 ENCOUNTER — Encounter: Payer: Self-pay | Admitting: Cardiovascular Disease

## 2023-06-24 ENCOUNTER — Ambulatory Visit: Payer: Medicare PPO | Attending: Cardiovascular Disease | Admitting: Cardiovascular Disease

## 2023-06-24 VITALS — BP 114/60 | HR 74 | Ht 69.5 in | Wt 224.0 lb

## 2023-06-24 DIAGNOSIS — I2510 Atherosclerotic heart disease of native coronary artery without angina pectoris: Secondary | ICD-10-CM | POA: Diagnosis not present

## 2023-06-24 DIAGNOSIS — I10 Essential (primary) hypertension: Secondary | ICD-10-CM

## 2023-06-24 DIAGNOSIS — E782 Mixed hyperlipidemia: Secondary | ICD-10-CM

## 2023-06-24 NOTE — Assessment & Plan Note (Signed)
History of CAD status post LAD and circumflex stenting in 2001 and 2004 respectively by Drs. Charlies Constable  and Geralynn Rile.  His last catheterization performed by Dr. Eldridge Dace 02/14/2015 revealed diffuse LAD in-stent restenosis which was restented using a 3.5 mm x 20 mm long Synergy drug-eluting stent.  He had ostial diagonal branch stenosis which was jailed as well as 75% first obtuse marginal branch stenosis and an occluded nondominant RCA.  He is completely asymptomatic.

## 2023-06-24 NOTE — Assessment & Plan Note (Signed)
History of hyperlipidemia on pravastatin and Zetia with lipid profile performed 01/23/2023 revealing total cholesterol 148, LDL of 84 and HDL of 39.  He is not at goal for secondary prevention however given his age and the fact that he is nearly maxed out on his pravastatin I did not feel compelled to change his statin drug at this time.

## 2023-06-24 NOTE — Patient Instructions (Addendum)
Medication Instructions:  Your physician recommends that you continue on your current medications as directed. Please refer to the Current Medication list given to you today.  *If you need a refill on your cardiac medications before your next appointment, please call your pharmacy*   Follow-Up: At [facility], you and your health needs are our priority.  As part of our continuing mission to provide you with exceptional heart care, we have created designated Provider Care Teams.  These Care Teams include your primary Cardiologist (physician) and Advanced Practice Providers (APPs -  Physician Assistants and Nurse Practitioners) who all work together to provide you with the care you need, when you need it.  We recommend signing up for the patient portal called "MyChart".  Sign up information is provided on this After Visit Summary.  MyChart is used to connect with patients for Virtual Visits (Telemedicine).  Patients are able to view lab/test results, encounter notes, upcoming appointments, etc.  Non-urgent messages can be sent to your provider as well.   To learn more about what you can do with MyChart, go to [email or web address].    Your next appointment:   12 month(s)  Provider:   Nanetta Batty, MD     Other Instructions Hold Plavix for 5 days prior to biopsy.

## 2023-06-24 NOTE — Assessment & Plan Note (Signed)
History of essential hypertension blood pressure measured today at 114/60.  He is on metoprolol, valsartan and hydrochlorothiazide.

## 2023-06-24 NOTE — Progress Notes (Signed)
06/24/2023 Kevin [name]   1945-05-14  [id number]  Primary Physician Assunta Found, MD Primary Cardiologist: Runell Gess MD FACP, [place], [place], [place]  HPI:  Kevin [name] is a 78 y.o.    severely overweight divorced Caucasian male, father of 1 child, who I last saw in the office 03/19/2022. 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 He has a history of CAD, status post remote LAD and circumflex stenting in 2001 and 2004 respectively by Drs. Charlies Constable and Geralynn Rile. He stopped smoking and drinking at that time as well. His other problems include hypertension, hyperlipidemia, and non-insulin-requiring diabetes. He also has obstructive sleep apnea, on CPAP. He has had lap-banding in the past with ultimate reaccumulation of his weight. He has had endovenous ablation by Dr. Lynnea Ferrier of his left greater saphenous vein, which really did not afford significant clinical improvement. He had a functional study performed December 22, 2008, which was nonischemic, and another one June 24, 2011, because of chest pain, which was low risk. He has been asymptomatic.Dr. Phillips Odor recently checked his lipid profile last week as an outpatient. He was admitted to [facility] last month which has been shortness of breath. He underwent cardiac catheterization by Dr. Erskine Emery Smith's revealing patent LAD and circumflex stents and a left dominant system with an occluded nondominant RCA. Medical therapy was recommended. He experimented with stopping his newly added diabetes medicines one of which apparently was contributing to his symptoms. He feels clinically improved after stopping this medication. Since I saw him in March 2016 he was admitted with unstable angina on 02/14/15 and underwent cardiac catheterization by Dr. Eldridge Dace His LAD stent had diffuse in-stent restenosis with a 95% distal edge stenosis which was restented using a 3.5 x 28 mm long synergy drug-eluting stent.  He did have an ostial diagonal branch stenosis which was "jailedand a 75% first obtuse marginal branch stenosis as well as an occluded nondominant RCA.   He was started on Synthroid at 100 mcg and noticed tachypalpitations.  An event monitor showed occasional PACs, PVCs and short runs of SVT.  His dose was lowered to 50 mcg and his palpitations have resolved.  He had his right shoulder operated on by Dr. Hayden Rasmussen 08/23/2020 which he has recuperated from.  Since I saw him a year ago he continues to do well.  He denies chest pain or shortness of breath.  He apparently needs a prostate biopsy and cardiology clearance.  His PSA went from 10-15 over several months.  Given his stability and low risk nature of the procedure he is cleared from cardiology.  Okay to hold antiplatelets.   Current Meds  Medication Sig   Ascorbic Acid (VITAMIN C) 1000 MG tablet Take 1,000 mg by mouth daily.   aspirin 81 MG tablet Take 81 mg by mouth daily.   clopidogrel (PLAVIX) 75 MG tablet TAKE ONE TABLET BY MOUTH ONCE DAILY.   Coenzyme Q10 (CO Q 10) 10 MG CAPS Take 1 capsule by mouth daily.   diphenhydrAMINE (BENADRYL) 25 MG tablet Take 25 mg by mouth every 6 (six) hours as needed.   ezetimibe (ZETIA) 10 MG tablet Take 1 tablet (10 mg total) by mouth daily.   fluticasone (FLONASE) 50 MCG/ACT nasal spray Place 2 sprays into both nostrils daily.   furosemide (LASIX) 40 MG tablet Take 40 mg by mouth daily.   JANUVIA 100 MG tablet Take 100 mg by  mouth daily.   levofloxacin (LEVAQUIN) 750 MG tablet Take 750 mg by mouth once.   levothyroxine (SYNTHROID) 50 MCG tablet Take 50 mcg by mouth daily before breakfast.   metoprolol (TOPROL-XL) 100 MG 24 hr tablet Take 100 mg by mouth daily.   niacin (NIASPAN) 1000 MG CR tablet TAKE 1 TABLET BY MOUTH AT BEDTIME.   pravastatin (PRAVACHOL) 40 MG tablet Take 1 tablet (40 mg total) by mouth every evening.   tamsulosin (FLOMAX) 0.4 MG CAPS capsule Take 0.4 mg by mouth daily.    tirzepatide [facility]) 5 MG/0.5ML Pen Inject 5 mg into the skin once a week.   valsartan-hydrochlorothiazide (DIOVAN-HCT) 160-12.5 MG tablet Take 1 tablet by mouth daily.     Allergies  Allergen Reactions   Irbesartan Shortness Of Breath and Other (See Comments)   Prednisone Anaphylaxis, Swelling and Other (See Comments)    SWELLING OF THE LIPS   Ramipril Shortness Of Breath and Other (See Comments)   Shellfish Allergy Anaphylaxis    Shrimp, selfish   Tradjenta [Linagliptin] Shortness Of Breath   Cephalexin Hives and Swelling   Clindamycin Hcl Hives and Swelling   Clindamycin Hcl Other (See Comments)   Codeine Nausea And Vomiting and Other (See Comments)    AFFECTS HEART   Contrast Media [Iodinated Contrast Media]     Can be administered if Benadryl is administered prior to prevent reaction    Crab Extract Other (See Comments)   Fenofibrate Other (See Comments)    "My skin started peeling off"   Glycotrol [Support-500] Other (See Comments)    unknown   Indomethacin Other (See Comments)    UNKNOWN REACTION   Minocycline Other (See Comments)   Minocycline Hcl Hives and Swelling   Morphine And Codeine Nausea And Vomiting    PROJECTILE VOMITING   Shrimp Extract Other (See Comments)   Sulfa Drugs Cross Reactors Hives and Swelling   Sulfamethoxazole-Trimethoprim Other (See Comments)   Tetracyclines & Related    Tree Extract Itching    Social History   Socioeconomic History   Marital status: Divorced    Spouse name: Not on file   Number of children: 1   Years of education: 12   Highest education level: 12th grade  Occupational History   Occupation: Manufacturing engineer: [employer]    Comment: [employer]  Tobacco Use   Smoking status: Former    Current packs/day: 0.00    Average packs/day: 3.0 packs/day for 30.0 years (89.9 ttl pk-yrs)    Types: Cigarettes    Start date: 11/18/1964    Quit date: 10/04/1994    Years since quitting: 28.7    Passive exposure: Past    Smokeless tobacco: Never   Tobacco comments:    quit smoking 19 years ago  Vaping Use   Vaping status: Never Used  Substance and Sexual Activity   Alcohol use: Not Currently    Comment: 3-4 times/year   Drug use: No   Sexual activity: Not Currently    Partners: Female  Other Topics Concern   Not on file  Social History Narrative   Not on file   Social Drivers of Health   Financial Resource Strain: Low Risk  (08/05/2022)   Overall Financial Resource Strain (CARDIA)    Difficulty of Paying Living Expenses: Not hard at all  Food Insecurity: No Food Insecurity (08/05/2022)   Hunger Vital Sign    Worried About Running Out of Food in the Last Year: Never true  Ran Out of Food in the Last Year: Never true  Transportation Needs: No Transportation Needs (08/05/2022)   PRAPARE - Administrator, Civil Service (Medical): No    Lack of Transportation (Non-Medical): No  Physical Activity: Inactive (08/05/2022)   Exercise Vital Sign    Days of Exercise per Week: 0 days    Minutes of Exercise per Session: 0 min  Stress: No Stress Concern Present (08/05/2022)   [employer] of Occupational Health - Occupational Stress Questionnaire    Feeling of Stress : Only a little  Social Connections: Moderately Integrated (08/05/2022)   Social Connection and Isolation Panel [NHANES]    Frequency of Communication with Friends and Family: More than three times a week    Frequency of Social Gatherings with Friends and Family: More than three times a week    Attends Religious Services: More than 4 times per year    Active Member of [employer] or Organizations: Yes    Attends Engineer, structural: More than 4 times per year    Marital Status: Divorced  Intimate Partner Violence: Not At Risk (08/05/2022)   Humiliation, Afraid, Rape, and Kick questionnaire    Fear of Current or Ex-Partner: No    Emotionally Abused: No    Physically Abused: No    Sexually Abused: No     Review of  Systems: General: negative for chills, fever, night sweats or weight changes.  Cardiovascular: negative for chest pain, dyspnea on exertion, edema, orthopnea, palpitations, paroxysmal nocturnal dyspnea or shortness of breath Dermatological: negative for rash Respiratory: negative for cough or wheezing Urologic: negative for hematuria Abdominal: negative for nausea, vomiting, diarrhea, bright red blood per rectum, melena, or hematemesis Neurologic: negative for visual changes, syncope, or dizziness All other systems reviewed and are otherwise negative except as noted above.    Blood pressure 114/60, pulse 74, height 5' 9.5" (1.765 m), weight 224 lb (101.6 kg), SpO2 99%.  General appearance: alert and no distress Neck: no adenopathy, no carotid bruit, no JVD, supple, symmetrical, trachea midline, and thyroid not enlarged, symmetric, no tenderness/mass/nodules Lungs: clear to auscultation bilaterally Heart: regular rate and rhythm, S1, S2 normal, no murmur, click, rub or gallop Extremities: extremities normal, atraumatic, no cyanosis or edema Pulses: 2+ and symmetric Skin: Skin color, texture, turgor normal. No rashes or lesions Neurologic: Grossly normal  EKG sinus rhythm at 72 with occasional PVCs.  I personally reviewed this EKG.      ASSESSMENT AND PLAN:   Coronary artery disease, hx LAD and LCX stenting in 2001 & 2004, last cath 2009 with patent stents. History of CAD status post LAD and circumflex stenting in 2001 and 2004 respectively by Drs. Charlies Constable  and Geralynn Rile.  His last catheterization performed by Dr. Eldridge Dace 02/14/2015 revealed diffuse LAD in-stent restenosis which was restented using a 3.5 mm x 20 mm long Synergy drug-eluting stent.  He had ostial diagonal branch stenosis which was jailed as well as 75% first obtuse marginal branch stenosis and an occluded nondominant RCA.  He is completely asymptomatic.  Essential hypertension History of essential hypertension  blood pressure measured today at 114/60.  He is on metoprolol, valsartan and hydrochlorothiazide.  Mixed hyperlipidemia History of hyperlipidemia on pravastatin and Zetia with lipid profile performed 01/23/2023 revealing total cholesterol 148, LDL of 84 and HDL of 39.  He is not at goal for secondary prevention however given his age and the fact that he is nearly maxed out on his pravastatin I did  not feel compelled to change his statin drug at this time.     Runell Gess MD FACP,FACC,FAHA, [facility] 06/24/2023 9:28 AM

## 2023-06-25 ENCOUNTER — Telehealth: Payer: Self-pay | Admitting: Cardiovascular Disease

## 2023-06-25 NOTE — Telephone Encounter (Signed)
Follow Up:       Kevin [name] is calling to see what the status of this pt's clearance. Pleas fax asap [phone].

## 2023-06-25 NOTE — Telephone Encounter (Signed)
   Patient Name: Kevin [name]  DOB: Jun 07, 1945 MRN: [id number]  Primary Cardiologist: Nanetta Batty, MD  Chart reviewed as part of pre-operative protocol coverage. Given past medical history and time since last visit, based on ACC/AHA guidelines, LINELL SHAWN is at acceptable risk for the planned procedure without further cardiovascular testing.   Per Dr. Allyson Sabal on 06/24/2023:  Given his stability and low risk nature of the procedure he is cleared from cardiology. Okay to hold antiplatelets.   Per office protocol, if patient is without any new symptoms or concerns at the time of their virtual visit, he/she may hold ASA for 7 days and Plavix for 5  days prior to procedure. Please resume ASA and Plavix as soon as possible postprocedure, at the discretion of the surgeon.    The patient was advised that if he develops new symptoms prior to surgery to contact our office to arrange for a follow-up visit, and he verbalized understanding.  I will route this recommendation to the requesting party via Epic fax function and remove from pre-op pool.  Please call with questions.  Joni Reining, NP 06/25/2023, 4:33 PM

## 2023-06-25 NOTE — Telephone Encounter (Signed)
Pt was seen by Dr. Allyson Sabal on 06/24/23. Pt still needs to be cleared for surgery. Please advise. Thank you.

## 2023-06-26 DIAGNOSIS — R5383 Other fatigue: Secondary | ICD-10-CM | POA: Diagnosis not present

## 2023-06-26 DIAGNOSIS — R194 Change in bowel habit: Secondary | ICD-10-CM | POA: Diagnosis not present

## 2023-06-26 DIAGNOSIS — R197 Diarrhea, unspecified: Secondary | ICD-10-CM | POA: Diagnosis not present

## 2023-06-26 NOTE — Progress Notes (Signed)
Order(s) created erroneously. Erroneous order ID: [id number]  Order moved by: CHART CORRECTION ANALYST, FIFTEEN  Order move date/time: 06/26/2023 5:15 PM  Source Patient: [id number]  Source Contact: 06/24/2023  Destination Patient: [id number]  Destination Contact: 10/15/2022

## 2023-06-27 LAB — CLOSTRIDIUM DIFFICILE BY PCR: Toxigenic C. Difficile by PCR: NEGATIVE

## 2023-06-30 ENCOUNTER — Encounter (HOSPITAL_COMMUNITY): Payer: Self-pay | Admitting: Family Medicine

## 2023-06-30 ENCOUNTER — Other Ambulatory Visit: Payer: Self-pay

## 2023-06-30 ENCOUNTER — Inpatient Hospital Stay (HOSPITAL_COMMUNITY)
Admission: EM | Admit: 2023-06-30 | Discharge: 2023-07-03 | DRG: 683 | Disposition: A | Discharge status: Home / Self Care | Payer: Medicare PPO | Source: Ambulatory Visit | Attending: Internal Medicine | Admitting: Internal Medicine

## 2023-06-30 DIAGNOSIS — E1165 Type 2 diabetes mellitus with hyperglycemia: Secondary | ICD-10-CM | POA: Diagnosis present

## 2023-06-30 DIAGNOSIS — E871 Hypo-osmolality and hyponatremia: Secondary | ICD-10-CM | POA: Diagnosis present

## 2023-06-30 DIAGNOSIS — E669 Obesity, unspecified: Secondary | ICD-10-CM | POA: Diagnosis present

## 2023-06-30 DIAGNOSIS — E1122 Type 2 diabetes mellitus with diabetic chronic kidney disease: Secondary | ICD-10-CM | POA: Diagnosis present

## 2023-06-30 DIAGNOSIS — Z8249 Family history of ischemic heart disease and other diseases of the circulatory system: Secondary | ICD-10-CM | POA: Diagnosis not present

## 2023-06-30 DIAGNOSIS — E785 Hyperlipidemia, unspecified: Secondary | ICD-10-CM | POA: Diagnosis present

## 2023-06-30 DIAGNOSIS — Z8379 Family history of other diseases of the digestive system: Secondary | ICD-10-CM

## 2023-06-30 DIAGNOSIS — L409 Psoriasis, unspecified: Secondary | ICD-10-CM | POA: Diagnosis present

## 2023-06-30 DIAGNOSIS — Z833 Family history of diabetes mellitus: Secondary | ICD-10-CM

## 2023-06-30 DIAGNOSIS — I130 Hypertensive heart and chronic kidney disease with heart failure and stage 1 through stage 4 chronic kidney disease, or unspecified chronic kidney disease: Secondary | ICD-10-CM | POA: Diagnosis present

## 2023-06-30 DIAGNOSIS — I10 Essential (primary) hypertension: Secondary | ICD-10-CM | POA: Diagnosis not present

## 2023-06-30 DIAGNOSIS — Z885 Allergy status to narcotic agent status: Secondary | ICD-10-CM

## 2023-06-30 DIAGNOSIS — R197 Diarrhea, unspecified: Secondary | ICD-10-CM | POA: Diagnosis present

## 2023-06-30 DIAGNOSIS — Z7985 Long-term (current) use of injectable non-insulin antidiabetic drugs: Secondary | ICD-10-CM

## 2023-06-30 DIAGNOSIS — Z955 Presence of coronary angioplasty implant and graft: Secondary | ICD-10-CM

## 2023-06-30 DIAGNOSIS — Z7982 Long term (current) use of aspirin: Secondary | ICD-10-CM

## 2023-06-30 DIAGNOSIS — E039 Hypothyroidism, unspecified: Secondary | ICD-10-CM | POA: Diagnosis present

## 2023-06-30 DIAGNOSIS — A0811 Acute gastroenteropathy due to Norwalk agent: Secondary | ICD-10-CM | POA: Diagnosis present

## 2023-06-30 DIAGNOSIS — Z87891 Personal history of nicotine dependence: Secondary | ICD-10-CM

## 2023-06-30 DIAGNOSIS — Z79890 Hormone replacement therapy (postmenopausal): Secondary | ICD-10-CM | POA: Diagnosis not present

## 2023-06-30 DIAGNOSIS — Z860100 Personal history of colon polyps, unspecified: Secondary | ICD-10-CM

## 2023-06-30 DIAGNOSIS — Z7984 Long term (current) use of oral hypoglycemic drugs: Secondary | ICD-10-CM | POA: Diagnosis not present

## 2023-06-30 DIAGNOSIS — G4733 Obstructive sleep apnea (adult) (pediatric): Secondary | ICD-10-CM | POA: Diagnosis present

## 2023-06-30 DIAGNOSIS — N179 Acute kidney failure, unspecified: Principal | ICD-10-CM

## 2023-06-30 DIAGNOSIS — I2510 Atherosclerotic heart disease of native coronary artery without angina pectoris: Secondary | ICD-10-CM | POA: Diagnosis present

## 2023-06-30 DIAGNOSIS — N400 Enlarged prostate without lower urinary tract symptoms: Secondary | ICD-10-CM | POA: Diagnosis present

## 2023-06-30 DIAGNOSIS — E6609 Other obesity due to excess calories: Secondary | ICD-10-CM | POA: Diagnosis not present

## 2023-06-30 DIAGNOSIS — Z8709 Personal history of other diseases of the respiratory system: Secondary | ICD-10-CM

## 2023-06-30 DIAGNOSIS — Z8711 Personal history of peptic ulcer disease: Secondary | ICD-10-CM

## 2023-06-30 DIAGNOSIS — Z7902 Long term (current) use of antithrombotics/antiplatelets: Secondary | ICD-10-CM

## 2023-06-30 DIAGNOSIS — E66811 Obesity, class 1: Secondary | ICD-10-CM | POA: Diagnosis present

## 2023-06-30 DIAGNOSIS — Z79899 Other long term (current) drug therapy: Secondary | ICD-10-CM

## 2023-06-30 DIAGNOSIS — Z6833 Body mass index (BMI) 33.0-33.9, adult: Secondary | ICD-10-CM | POA: Diagnosis not present

## 2023-06-30 DIAGNOSIS — Z6835 Body mass index (BMI) 35.0-35.9, adult: Secondary | ICD-10-CM | POA: Diagnosis not present

## 2023-06-30 DIAGNOSIS — Z91041 Radiographic dye allergy status: Secondary | ICD-10-CM

## 2023-06-30 DIAGNOSIS — A09 Infectious gastroenteritis and colitis, unspecified: Secondary | ICD-10-CM | POA: Diagnosis present

## 2023-06-30 DIAGNOSIS — Z888 Allergy status to other drugs, medicaments and biological substances status: Secondary | ICD-10-CM

## 2023-06-30 DIAGNOSIS — Z800 Family history of malignant neoplasm of digestive organs: Secondary | ICD-10-CM

## 2023-06-30 DIAGNOSIS — E860 Dehydration: Secondary | ICD-10-CM | POA: Diagnosis present

## 2023-06-30 DIAGNOSIS — N183 Chronic kidney disease, stage 3 unspecified: Secondary | ICD-10-CM | POA: Diagnosis not present

## 2023-06-30 DIAGNOSIS — K5150 Left sided colitis without complications: Secondary | ICD-10-CM | POA: Diagnosis not present

## 2023-06-30 DIAGNOSIS — I5032 Chronic diastolic (congestive) heart failure: Secondary | ICD-10-CM | POA: Diagnosis present

## 2023-06-30 DIAGNOSIS — Z8719 Personal history of other diseases of the digestive system: Secondary | ICD-10-CM

## 2023-06-30 DIAGNOSIS — N1831 Chronic kidney disease, stage 3a: Secondary | ICD-10-CM | POA: Diagnosis present

## 2023-06-30 DIAGNOSIS — Z881 Allergy status to other antibiotic agents status: Secondary | ICD-10-CM

## 2023-06-30 DIAGNOSIS — Z882 Allergy status to sulfonamides status: Secondary | ICD-10-CM

## 2023-06-30 DIAGNOSIS — Z85820 Personal history of malignant melanoma of skin: Secondary | ICD-10-CM

## 2023-06-30 LAB — GI PROFILE, STOOL, PCR

## 2023-06-30 LAB — COMPREHENSIVE METABOLIC PANEL
ALT: 9 [IU]/L (ref 0–44)
ALT: 12 U/L (ref 0–44)
AST: 8 [IU]/L (ref 0–40)
AST: 16 U/L (ref 15–41)
Albumin: 4.8 g/dL (ref 3.8–4.8)
Albumin: 4.6 g/dL (ref 3.5–5.0)
Alkaline Phosphatase: 30 [IU]/L — ABNORMAL LOW (ref 44–121)
Alkaline Phosphatase: 23 U/L — ABNORMAL LOW (ref 38–126)
Anion gap: 12 (ref 5–15)
BUN/Creatinine Ratio: 22 (ref 10–24)
BUN: 59 mg/dL — ABNORMAL HIGH (ref 8–27)
BUN: 66 mg/dL — ABNORMAL HIGH (ref 8–23)
Bilirubin Total: 0.7 mg/dL (ref 0.0–1.2)
CO2: 22 mmol/L (ref 20–29)
CO2: 23 mmol/L (ref 22–32)
Calcium: 10.4 mg/dL — ABNORMAL HIGH (ref 8.6–10.2)
Calcium: 9.9 mg/dL (ref 8.9–10.3)
Chloride: 103 mmol/L (ref 96–106)
Chloride: 98 mmol/L (ref 98–111)
Creatinine, Ser: 2.72 mg/dL — ABNORMAL HIGH (ref 0.76–1.27)
Creatinine, Ser: 2.73 mg/dL — ABNORMAL HIGH (ref 0.61–1.24)
GFR, Estimated: 23 mL/min — ABNORMAL LOW (ref >60)
Globulin, Total: 2.1 g/dL (ref 1.5–4.5)
Glucose, Bld: 140 mg/dL — ABNORMAL HIGH (ref 70–99)
Glucose: 163 mg/dL — ABNORMAL HIGH (ref 70–99)
Potassium: 4.4 mmol/L (ref 3.5–5.2)
Potassium: 3.8 mmol/L (ref 3.5–5.1)
Sodium: 143 mmol/L (ref 134–144)
Sodium: 133 mmol/L — ABNORMAL LOW (ref 135–145)
Total Bilirubin: 1 mg/dL (ref 0.0–1.2)
Total Protein: 6.9 g/dL (ref 6.0–8.5)
Total Protein: 7.7 g/dL (ref 6.5–8.1)
eGFR: 23 mL/min/{1.73_m2} — ABNORMAL LOW (ref >59)

## 2023-06-30 LAB — PANCREATIC ELASTASE, FECAL: Pancreatic Elastase, Fecal: 232 ug Elast./g (ref >200)

## 2023-06-30 LAB — CBC
Hematocrit: 34.9 % — ABNORMAL LOW (ref 37.5–51.0)
Hemoglobin: 11.7 g/dL — ABNORMAL LOW (ref 13.0–17.7)
MCH: 32.6 pg (ref 26.6–33.0)
MCHC: 33.5 g/dL (ref 31.5–35.7)
MCV: 97 fL (ref 79–97)
Platelets: 121 10*3/uL — ABNORMAL LOW (ref 150–450)
RBC: 3.59 x10E6/uL — ABNORMAL LOW (ref 4.14–5.80)
RDW: 12.5 % (ref 11.6–15.4)
WBC: 4.2 10*3/uL (ref 3.4–10.8)

## 2023-06-30 LAB — IRON,TIBC AND FERRITIN PANEL
Ferritin: 481 ng/mL — ABNORMAL HIGH (ref 30–400)
Iron Saturation: 35 % (ref 15–55)
Iron: 99 ug/dL (ref 38–169)
Total Iron Binding Capacity: 283 ug/dL (ref 250–450)
UIBC: 184 ug/dL (ref 111–343)

## 2023-06-30 LAB — CBC WITH DIFFERENTIAL/PLATELET
Abs Immature Granulocytes: 0.01 10*3/uL (ref 0.00–0.07)
Basophils Absolute: 0.1 10*3/uL (ref 0.0–0.1)
Basophils Relative: 1 %
Eosinophils Absolute: 0.3 10*3/uL (ref 0.0–0.5)
Eosinophils Relative: 7 %
HCT: 34.5 % — ABNORMAL LOW (ref 39.0–52.0)
Hemoglobin: 11.6 g/dL — ABNORMAL LOW (ref 13.0–17.0)
Immature Granulocytes: 0 %
Lymphocytes Relative: 33 %
Lymphs Abs: 1.7 10*3/uL (ref 0.7–4.0)
MCH: 32.5 pg (ref 26.0–34.0)
MCHC: 33.6 g/dL (ref 30.0–36.0)
MCV: 96.6 fL (ref 80.0–100.0)
Monocytes Absolute: 0.6 10*3/uL (ref 0.1–1.0)
Monocytes Relative: 11 %
Neutro Abs: 2.5 10*3/uL (ref 1.7–7.7)
Neutrophils Relative %: 48 %
Platelets: 126 10*3/uL — ABNORMAL LOW (ref 150–400)
RBC: 3.57 MIL/uL — ABNORMAL LOW (ref 4.22–5.81)
RDW: 12.9 % (ref 11.5–15.5)
WBC: 5.2 10*3/uL (ref 4.0–10.5)
nRBC: 0 % (ref 0.0–0.2)

## 2023-06-30 LAB — C DIFFICILE QUICK SCREEN W PCR REFLEX
C Diff antigen: NEGATIVE
C Diff interpretation: NOT DETECTED
C Diff toxin: NEGATIVE

## 2023-06-30 LAB — URINALYSIS, ROUTINE W REFLEX MICROSCOPIC
Bilirubin Urine: NEGATIVE
Glucose, UA: 50 mg/dL — AB
Ketones, ur: NEGATIVE mg/dL
Leukocytes,Ua: NEGATIVE
Nitrite: NEGATIVE
Protein, ur: NEGATIVE mg/dL
Specific Gravity, Urine: 1.005 (ref 1.005–1.030)
pH: 5 (ref 5.0–8.0)

## 2023-06-30 LAB — TSH+FREE T4
Free T4: 2 ng/dL — ABNORMAL HIGH (ref 0.82–1.77)
TSH: 1.57 u[IU]/mL (ref 0.450–4.500)

## 2023-06-30 LAB — C-REACTIVE PROTEIN: CRP: 3 mg/L (ref 0–10)

## 2023-06-30 LAB — CALPROTECTIN, FECAL: Calprotectin, Fecal: 45 ug/g (ref 0–120)

## 2023-06-30 LAB — LIPASE, BLOOD: Lipase: 50 U/L (ref 11–51)

## 2023-06-30 LAB — CBG MONITORING, ED: Glucose-Capillary: 146 mg/dL — ABNORMAL HIGH (ref 70–99)

## 2023-06-30 MED ORDER — NIACIN ER (ANTIHYPERLIPIDEMIC) 500 MG PO TBCR
1000.0000 mg | EXTENDED_RELEASE_TABLET | Freq: Every day | ORAL | Status: DC
Start: 2023-07-01 — End: 2023-07-03
  Administered 2023-07-01 – 2023-07-02 (×2): 1000 mg via ORAL
  Filled 2023-06-30 (×5): qty 2

## 2023-06-30 MED ORDER — INSULIN ASPART 100 UNIT/ML IJ SOLN
0.0000 [IU] | Freq: Three times a day (TID) | INTRAMUSCULAR | Status: DC
  Administered 2023-07-01 – 2023-07-02 (×2): 2 [IU] via SUBCUTANEOUS
  Administered 2023-07-03: 1 [IU] via SUBCUTANEOUS

## 2023-06-30 MED ORDER — LACTATED RINGERS IV BOLUS
500.0000 mL | Freq: Once | INTRAVENOUS | Status: AC
  Administered 2023-06-30: 500 mL via INTRAVENOUS

## 2023-06-30 MED ORDER — INSULIN ASPART 100 UNIT/ML IJ SOLN: 0.0000 [IU] | Freq: Every day | INTRAMUSCULAR | Status: DC

## 2023-06-30 MED ORDER — BISACODYL 10 MG RE SUPP: 10.0000 mg | Freq: Every day | RECTAL | Status: DC | PRN

## 2023-06-30 MED ORDER — POLYETHYLENE GLYCOL 3350 17 G PO PACK: 17.0000 g | PACK | Freq: Every day | ORAL | Status: DC | PRN

## 2023-06-30 MED ORDER — PRAVASTATIN SODIUM 40 MG PO TABS
40.0000 mg | ORAL_TABLET | Freq: Every evening | ORAL | Status: DC
Start: 2023-06-30 — End: 2023-07-03
  Administered 2023-06-30 – 2023-07-02 (×3): 40 mg via ORAL
  Filled 2023-06-30 (×2): qty 1

## 2023-06-30 MED ORDER — ACETAMINOPHEN 325 MG PO TABS: 650.0000 mg | ORAL_TABLET | Freq: Four times a day (QID) | ORAL | Status: DC | PRN

## 2023-06-30 MED ORDER — ACETAMINOPHEN 650 MG RE SUPP: 650.0000 mg | Freq: Four times a day (QID) | RECTAL | Status: DC | PRN

## 2023-06-30 MED ORDER — LEVOTHYROXINE SODIUM 50 MCG PO TABS
50.0000 ug | ORAL_TABLET | Freq: Every day | ORAL | Status: DC
  Administered 2023-07-01 – 2023-07-03 (×3): 50 ug via ORAL
  Filled 2023-06-30 (×3): qty 1

## 2023-06-30 MED ORDER — LOPERAMIDE HCL 2 MG PO CAPS
2.0000 mg | ORAL_CAPSULE | Freq: Four times a day (QID) | ORAL | Status: DC | PRN
  Filled 2023-06-30: qty 1

## 2023-06-30 MED ORDER — TRAZODONE HCL 50 MG PO TABS: 50.0000 mg | ORAL_TABLET | Freq: Every evening | ORAL | Status: DC | PRN

## 2023-06-30 MED ORDER — SODIUM CHLORIDE 0.9 % IV SOLN: INTRAVENOUS | Status: AC | PRN

## 2023-06-30 MED ORDER — SODIUM CHLORIDE 0.9 % IV SOLN: INTRAVENOUS | Status: AC

## 2023-06-30 MED ORDER — ASPIRIN 81 MG PO CHEW
81.0000 mg | CHEWABLE_TABLET | Freq: Every day | ORAL | Status: DC
  Administered 2023-07-01 – 2023-07-03 (×3): 81 mg via ORAL
  Filled 2023-06-30 (×7): qty 1

## 2023-06-30 MED ORDER — SODIUM CHLORIDE 0.9% FLUSH: 3.0000 mL | INTRAVENOUS | Status: DC | PRN

## 2023-06-30 MED ORDER — ONDANSETRON HCL 4 MG/2ML IJ SOLN
4.0000 mg | Freq: Four times a day (QID) | INTRAMUSCULAR | Status: DC | PRN
Start: 2023-06-30 — End: 2023-07-03

## 2023-06-30 MED ORDER — SODIUM CHLORIDE 0.9% FLUSH
3.0000 mL | Freq: Two times a day (BID) | INTRAVENOUS | Status: DC
  Administered 2023-06-30 – 2023-07-02 (×5): 3 mL via INTRAVENOUS

## 2023-06-30 MED ORDER — EZETIMIBE 10 MG PO TABS
10.0000 mg | ORAL_TABLET | Freq: Every day | ORAL | Status: DC
  Administered 2023-07-01 – 2023-07-03 (×3): 10 mg via ORAL
  Filled 2023-06-30 (×3): qty 1

## 2023-06-30 MED ORDER — HEPARIN SODIUM (PORCINE) 5000 UNIT/ML IJ SOLN
5000.0000 [IU] | Freq: Three times a day (TID) | INTRAMUSCULAR | Status: DC
  Administered 2023-06-30 – 2023-07-01 (×2): 5000 [IU] via SUBCUTANEOUS
  Filled 2023-06-30: qty 1

## 2023-06-30 MED ORDER — TAMSULOSIN HCL 0.4 MG PO CAPS
0.4000 mg | ORAL_CAPSULE | Freq: Every day | ORAL | Status: DC
  Administered 2023-07-01 – 2023-07-03 (×3): 0.4 mg via ORAL
  Filled 2023-06-30 (×3): qty 1

## 2023-06-30 MED ORDER — SODIUM CHLORIDE 0.9% FLUSH
3.0000 mL | Freq: Two times a day (BID) | INTRAVENOUS | Status: DC
  Administered 2023-06-30 – 2023-07-02 (×4): 3 mL via INTRAVENOUS

## 2023-06-30 MED ORDER — ONDANSETRON HCL 4 MG PO TABS: 4.0000 mg | ORAL_TABLET | Freq: Four times a day (QID) | ORAL | Status: DC | PRN

## 2023-06-30 NOTE — ED Triage Notes (Signed)
 Pt sent here from [facility] with complaints of Diarrhea for 1 month. Pt tested positive for norovirus. [place] reported dehydration and decreased kidney function.

## 2023-06-30 NOTE — H&P (Signed)
 History and Physical    Patient: Kevin [name]:[id number] DOB: 11/17/1945 DOA: 06/30/2023 DOS: the patient was seen and examined on 06/30/2023 PCP: Assunta Found, MD  Patient coming from: Home  Chief Complaint:  Chief Complaint  Patient presents with   Diarrhea    (Confirmed Norovirus)   HPI: Kevin [name] is a 78 y.o. male with medical history significant for history of asthma, CAD, status post remote LAD and circumflex stenting in 1999, 2001,2004, 02/24/2015  , DM2, HTN, HLD, melanoma, microscopic colitis, OSA not using CPAP, Lap-Band surgery  -Patient has had diarrhea since mid January--evaluated by GI team, diarrhea is without mucus or blood -No emesis no fevers --Patient GI/stool workup as follows--fecal calprotectin from 06/26/2023 WNL -C. difficile from 06/26/2023 negative -Fecal pancreatic elastase--on 06/26/2023-- negative -CRP on 06/26/2023 3 -Stool culture/GI pathogen negative except for Norovirus -Repeat C. difficile testing on 06/30/2023-negative -Repeat stool culture/GI pathogen test from 06/30/2023 pending -There was conversation about possibly doing a colonoscopy given history of microscopic colitis- As per patient's primary cardiologist Dr. Layla Maw Dr. Allyson Sabal on 06/24/2023:  Given his stability and low risk nature of the procedure/colonoscopy he is cleared from cardiology. Okay to hold antiplatelets.  - he may hold ASA for 7 days and Plavix for 5  days prior to procedure. Please resume ASA and Plavix as soon as possible postprocedure, at the discretion of the surgeon/endoscopist.   - -LFTs not elevated -Sodium is down to 133 from 143 on 06/26/2023 -Creatinine is up to 2.73 from a baseline around 1.1-1.2 -BUN is up to 66  -Lipase WNL -WBC 5.2 hemoglobin 11.6 which is close to prior baseline platelets 126 -UA not suggestive of UTI  Review of Systems: As mentioned in the history of present illness. All other systems reviewed and are negative. Past Medical History:   Diagnosis Date   Angio-edema    Asthma    as a child. no problems now.    CAD (coronary artery disease)    Cellulitis 5/11   left leg   Diabetes mellitus    Diverticulosis    Diverticulosis    HTN (hypertension)    Hyperlipidemia    Melanoma (HCC) 10/11   left foot   Melanoma of foot (HCC) 06/02/2011   Microscopic colitis 10/09/10   Colonoscopy dr Jena Gauss   Obesity    OSA on CPAP 01/20/2014   Psoriasis    S/P colonoscopy 07/03/03    Dr [name]->hyperplastic rectal polyp   S/P endoscopy 10/09/10   lap band intact, duodenal erosions   Seasonal allergies    Sleep apnea    Urticaria    Past Surgical History:  Procedure Laterality Date   ABDOMINAL SURGERY     ADENOIDECTOMY     APPENDECTOMY     CARDIAC CATHETERIZATION N/A 02/14/2015   Procedure: Left Heart Cath and Coronary Angiography;  Surgeon: Corky Crafts, MD;  Location: [facility] INVASIVE CV LAB;  Service: Cardiovascular;  Laterality: N/A;   CARDIAC CATHETERIZATION N/A 02/14/2015   Procedure: Coronary Stent Intervention;  Surgeon: Corky Crafts, MD;  Location: [facility] INVASIVE CV LAB;  Service: Cardiovascular;  Laterality: N/A;   COLONOSCOPY N/A 10/09/2010   normal rectum, long redundant colon, pancolonic diverticula, 6mm cecal polyp, diminutive polyp at the appendiceal orifice ablated with tip of hot snare (Biopsies suggestive of microscopic colitis; polyp benign; small bowel ok)   CORONARY ANGIOPLASTY  2002/2005/2008   CORONARY ANGIOPLASTY WITH STENT PLACEMENT  06/11/2007   PTCA & stenting distal CX, PTCA & balloon angioplasty  in-stent restenotic mid CX coronary artery stent   ESOPHAGOGASTRODUODENOSCOPY N/A 10/09/2010   Normal esophagus, lap band present, 2nd portion duodenum with erosions without frank ulcer   GASTRIC BANDING PORT REVISION N/A 09/20/2021   Procedure: GASTRIC BAND REMOVAL;  Surgeon: Lucretia Roers, MD;  Location: [facility];  Service: General;  Laterality: N/A;   KNEE SURGERY     left   LAPAROTOMY N/A  09/20/2021   Procedure: EXPLORATORY LAPAROTOMY,  LYSIS OF ADHESIONS, REDUCTION OF VOLVULUS;  Surgeon: Lucretia Roers, MD;  Location: [facility];  Service: General;  Laterality: N/A;   LEFT HEART CATHETERIZATION WITH CORONARY ANGIOGRAM N/A 07/15/2013   Procedure: LEFT HEART CATHETERIZATION WITH CORONARY ANGIOGRAM;  Surgeon: Lesleigh Noe, MD;  Location: [facility] CATH LAB;  Service: Cardiovascular;  Laterality: N/A;   NM MYOCAR PERF WALL MOTION  06/24/2011   small mild fixed anteroseptal & anteroapical defects.   PENILE PROSTHESIS IMPLANT     SHOULDER SURGERY     left   SINOSCOPY     TONSILLECTOMY     TYMPANOSTOMY TUBE PLACEMENT     Social History:  reports that he quit smoking about 28 years ago. His smoking use included cigarettes. He started smoking about 58 years ago. He has a 89.9 pack-year smoking history. He has been exposed to tobacco smoke. He has never used smokeless tobacco. He reports that he does not currently use alcohol. He reports that he does not use drugs.  Allergies  Allergen Reactions   Irbesartan Shortness Of Breath and Other (See Comments)   Prednisone Anaphylaxis, Swelling and Other (See Comments)    Swelling of the lips   Ramipril Shortness Of Breath and Other (See Comments)   Shellfish Allergy Anaphylaxis    Shrimp, shellfish   Tradjenta [Linagliptin] Shortness Of Breath   Cephalexin Hives and Swelling   Clindamycin Hcl Hives and Swelling   Codeine Nausea And Vomiting and Other (See Comments)    Affects heart   Contrast Media [Iodinated Contrast Media]     Can be administered if Benadryl is administered prior to prevent reaction    Crab Extract Other (See Comments)   Fenofibrate Other (See Comments)    "My skin started peeling off"   Glycotrol [Support-500] Other (See Comments)    unknown   Indomethacin Other (See Comments)    Unknown reaction   Minocycline Hcl Hives and Swelling   Morphine And Codeine Nausea And Vomiting    Projectile vomiting   Shrimp  Extract Other (See Comments)   Sulfa Drugs Cross Reactors Hives and Swelling   Tetracyclines & Related    Tree Extract Itching    Family History  Problem Relation Age of Onset   Coronary artery disease Mother    Diabetes Father    Liver cancer Brother 51   Celiac disease Other 46       brother's daughter   Allergic rhinitis Neg Hx    Angioedema Neg Hx    Asthma Neg Hx    Atopy Neg Hx    Eczema Neg Hx    Immunodeficiency Neg Hx    Urticaria Neg Hx     Prior to Admission medications   Medication Sig Start Date End Date Taking? Authorizing Provider  Ascorbic Acid (VITAMIN C) 1000 MG tablet Take 1,000 mg by mouth daily.    [provider]  aspirin 81 MG tablet Take 81 mg by mouth daily.    [provider]  clopidogrel (PLAVIX) 75 MG tablet TAKE ONE  TABLET BY MOUTH ONCE DAILY. 04/23/23   Runell Gess, MD  Coenzyme Q10 (CO Q 10) 10 MG CAPS Take 1 capsule by mouth daily.    [provider]  diphenhydrAMINE (BENADRYL) 25 MG tablet Take 25 mg by mouth every 6 (six) hours as needed.    [provider]  ezetimibe (ZETIA) 10 MG tablet Take 1 tablet (10 mg total) by mouth daily. 03/19/22   Runell Gess, MD  fluticasone (FLONASE) 50 MCG/ACT nasal spray Place 2 sprays into both nostrils daily.    [provider]  furosemide (LASIX) 40 MG tablet Take 40 mg by mouth daily.    [provider]  JANUVIA 100 MG tablet Take 100 mg by mouth daily. 03/12/22   [provider]  levofloxacin (LEVAQUIN) 750 MG tablet Take 750 mg by mouth once. 06/16/23   [provider]  levothyroxine (SYNTHROID) 50 MCG tablet Take 50 mcg by mouth daily before breakfast.    [provider]  metoprolol (TOPROL-XL) 100 MG 24 hr tablet Take 100 mg by mouth daily.    [provider]  niacin (NIASPAN) 1000 MG CR tablet TAKE 1 TABLET BY MOUTH AT BEDTIME. 01/30/23   Runell Gess, MD  pravastatin (PRAVACHOL) 40 MG tablet Take 1  tablet (40 mg total) by mouth every evening. 04/18/19   Runell Gess, MD  tamsulosin (FLOMAX) 0.4 MG CAPS capsule Take 0.4 mg by mouth daily. 07/17/20   [provider]  tirzepatide Greggory Keen) 5 MG/0.5ML Pen Inject 5 mg into the skin every Monday. 07/18/22   [provider]  valsartan-hydrochlorothiazide (DIOVAN-HCT) 160-12.5 MG tablet Take 1 tablet by mouth daily. 02/26/23   Azalee Course, PA    Physical Exam: Vitals:   06/30/23 1144 06/30/23 1144 06/30/23 1559  BP:  135/72 109/76  Pulse:  81 78  Resp:  16 18  Temp:  97.7 F (36.5 C) 97.9 F (36.6 C)  TempSrc:  Oral Oral  SpO2:  100%   Weight: 101.6 kg    Height: 5\' 9"  (1.753 m)      Physical Exam Gen:- Awake Alert, in no acute distress  HEENT:- [place].AT, No sclera icterus Neck-Supple Neck,No JVD,.  Lungs-  CTAB , fair air movement bilaterally  CV- S1, S2 normal, RRR Abd-  +ve B.Sounds, Abd Soft, No tenderness, nonincarcerated ventral hernia Extremity/Skin:- No  edema,   good pedal pulses  Psych-affect is appropriate, oriented x3 Neuro-no new focal deficits, no tremors GU-Penile implant  Data Reviewed: --Patient GI/stool workup as follows--fecal calprotectin from 06/26/2023 WNL -C. difficile from 06/26/2023 negative -Fecal pancreatic elastase--on 06/26/2023-- negative -CRP on 06/26/2023 3 -Stool culture/GI pathogen negative except for Norovirus -Repeat C. difficile testing on 06/30/2023-negative -Repeat stool culture/GI pathogen test from 06/30/2023 pending --LFTs not elevated -Sodium is down to 133 from 143 on 06/26/2023 -Creatinine is up to 2.73 from a baseline around 1.1-1.2 -BUN is up to 66  -Lipase WNL -WBC 5.2 hemoglobin 11.6 which is close to prior baseline platelets 126 -UA not suggestive of UTI  Assessment and Plan: 1)Norovirus Gastroenteritis with persistent diarrhea-- -Patient has had diarrhea since mid January--evaluated by GI team, diarrhea is without mucus or blood Patient GI/stool workup as  follows--fecal calprotectin from 06/26/2023 WNL -C. difficile from 06/26/2023 negative -Fecal pancreatic elastase--on 06/26/2023-- negative -CRP on 06/26/2023 is only 3 -Stool culture/GI pathogen negative except for Norovirus -Repeat C. difficile testing on 06/30/2023-negative -Repeat stool culture/GI pathogen test from 06/30/2023 pending -There was conversation about possibly doing a  colonoscopy given history of microscopic colitis- As per patient's primary cardiologist Dr. Layla Maw Dr. Allyson Sabal on 06/24/2023:  Given his stability and low risk nature of the procedure/colonoscopy he is cleared from cardiology. Okay to hold antiplatelets.  - he may hold ASA for 7 days and Plavix for 5  days prior to procedure. Please resume ASA and Plavix as soon as possible postprocedure, at the discretion of the surgeon/endoscopist. --As needed Imodium -Consider Lomotil -GI consult for possible endoluminal evaluation--patient already has preop from cardiologist dated 06/24/2023 with clearance for colonoscopy with sedation if needed --Last dose of Plavix 06/29/2023 - 2)AKI----acute kidney injury -due to dehydration in the setting of persistent diarrhea compounded by Lasix and losartan HCTZ use despite diarrhea for about a month -Creatinine is up to 2.73 from a baseline around 1.1-1.2 -BUN is up to 66  -No metabolic acidosis, no hyperkalemia, no anion gap -Hydrate orally and IV -Discontinue Lasix -Discontinue valsartan HCTZ  renally adjust medications, avoid nephrotoxic agents / dehydration  / hypotension   3)CAD---status post remote LAD and circumflex stenting in 1999, 2001,2004, 02/24/2015  , -Remains chest pain-free and asymptomatic -Continue aspirin, Zetia, pravastatin -Hold Plavix to allow for possible endoluminal evaluation -Hold metoprolol due to soft BP  4) DM2--prior A1c 7.7 updated uncontrolled DM with hyperglycemia -No recent A1c -Hold Mounjaro -Hold Januvia Use Novolog/Humalog Sliding scale  insulin with Accu-Cheks/Fingersticks as ordered   5) HTN--- soft BP due to dehydration in setting of diarrhea for 1 month -Hold valsartan HCTZ, hold Lasix -Hold metoprolol  6)Class 1 Obesity- -Low calorie diet, portion control and increase physical activity discussed with patient -Body mass index is 33.08 kg/m.  O -CPAP advised  -7)HypoNatremia-Sodium is down to 133 from 143 on 06/26/2023 -Due to dehydration and diarrhea related GI losses-  -Hydrate orally and IV - 8) BPH--doubt obstructive uropathy -Voiding well, continue Flomax  9) hypothyroidism--continue levothyroxine   Advance Care Planning:   Code Status: Prior --full code  Consults: Gi Consult requested  Family Communication: None present  Severity of Illness: The appropriate patient status for this patient is OBSERVATION. Observation status is judged to be reasonable and necessary in order to provide the required intensity of service to ensure the patient's safety. The patient's presenting symptoms, physical exam findings, and initial radiographic and laboratory data in the context of their medical condition is felt to place them at decreased risk for further clinical deterioration. Furthermore, it is anticipated that the patient will be medically stable for discharge from the hospital within 2 midnights of admission.   Author: Shon Hale, MD 06/30/2023 7:53 PM  For on call review [email or web address].

## 2023-06-30 NOTE — Progress Notes (Signed)
 Pt declined CPAP at this time but states he will call if he changes his mind

## 2023-06-30 NOTE — ED Provider Notes (Signed)
 [place] EMERGENCY DEPARTMENT AT [facility] Provider Note   CSN: [id number] Arrival date & time: 06/30/23  1136     History  Chief Complaint  Patient presents with   Diarrhea    (Confirmed Norovirus)    Kevin [name] is a 78 y.o. male.  Comes to the ER for evaluation of diarrhea x 1 month, having 60 episodes of watery stool daily.  States initially he thought it was due to his Greggory Keen but then when it was not improving he made appoint with his PCP, had appointment on 06/26/2023.  He had labs and stool studies ordered at that time.  Negative for C. difficile but positive for norovirus and he had AKI.  He received a phone call from his doctor to come to the ER for admission for AKI.  Patient reports he has been tolerating fluids drinking about a gallon of water plus drinking Gatorade daily.  He has occasional intestinal cramping but no significant pain, no chest pain or shortness of breath.   Diarrhea      Home Medications Prior to Admission medications   Medication Sig Start Date End Date Taking? Authorizing Provider  Ascorbic Acid (VITAMIN C) 1000 MG tablet Take 1,000 mg by mouth daily.    [provider]  aspirin 81 MG tablet Take 81 mg by mouth daily.    [provider]  clopidogrel (PLAVIX) 75 MG tablet TAKE ONE TABLET BY MOUTH ONCE DAILY. 04/23/23   Runell Gess, MD  Coenzyme Q10 (CO Q 10) 10 MG CAPS Take 1 capsule by mouth daily.    [provider]  diphenhydrAMINE (BENADRYL) 25 MG tablet Take 25 mg by mouth every 6 (six) hours as needed.    [provider]  ezetimibe (ZETIA) 10 MG tablet Take 1 tablet (10 mg total) by mouth daily. 03/19/22   Runell Gess, MD  fluticasone (FLONASE) 50 MCG/ACT nasal spray Place 2 sprays into both nostrils daily.    [provider]  furosemide (LASIX) 40 MG tablet Take 40 mg by mouth daily.    [provider]  JANUVIA 100 MG tablet Take 100 mg by mouth daily. 03/12/22    [provider]  levofloxacin (LEVAQUIN) 750 MG tablet Take 750 mg by mouth once. 06/16/23   [provider]  levothyroxine (SYNTHROID) 50 MCG tablet Take 50 mcg by mouth daily before breakfast.    [provider]  metoprolol (TOPROL-XL) 100 MG 24 hr tablet Take 100 mg by mouth daily.    [provider]  niacin (NIASPAN) 1000 MG CR tablet TAKE 1 TABLET BY MOUTH AT BEDTIME. 01/30/23   Runell Gess, MD  pravastatin (PRAVACHOL) 40 MG tablet Take 1 tablet (40 mg total) by mouth every evening. 04/18/19   Runell Gess, MD  tamsulosin (FLOMAX) 0.4 MG CAPS capsule Take 0.4 mg by mouth daily. 07/17/20   [provider]  tirzepatide Greggory Keen) 5 MG/0.5ML Pen Inject 5 mg into the skin once a week. 07/18/22   [provider]  valsartan-hydrochlorothiazide (DIOVAN-HCT) 160-12.5 MG tablet Take 1 tablet by mouth daily. 02/26/23   Azalee Course, PA      Allergies    Irbesartan, Prednisone, Ramipril, Shellfish allergy, Tradjenta [linagliptin], Cephalexin, Clindamycin hcl, Clindamycin hcl, Codeine, Contrast media [iodinated contrast media], Crab extract, Fenofibrate, Glycotrol [support-500], Indomethacin, Minocycline, Minocycline hcl, Morphine and codeine, Shrimp extract, Sulfa drugs cross reactors, Sulfamethoxazole-trimethoprim, Tetracyclines & related, and Tree extract    Review of Systems  Review of Systems  Gastrointestinal:  Positive for diarrhea.    Physical Exam Updated Vital Signs BP 135/72   Pulse 81   Temp 97.7 F (36.5 C) (Oral)   Resp 16   Ht 5\' 9"  (1.753 m)   Wt 101.6 kg   SpO2 100%   BMI 33.08 kg/m  Physical Exam Vitals and nursing note reviewed.  Constitutional:      General: He is not in acute distress.    Appearance: He is well-developed.  HENT:     Head: Normocephalic and atraumatic.     Mouth/Throat:     Mouth: Mucous membranes are moist.  Eyes:     Conjunctiva/sclera: Conjunctivae normal.  Cardiovascular:     Rate  and Rhythm: Normal rate and regular rhythm.     Heart sounds: No murmur heard. Pulmonary:     Effort: Pulmonary effort is normal. No respiratory distress.     Breath sounds: Normal breath sounds.  Abdominal:     Palpations: Abdomen is soft. There is no mass.     Tenderness: There is no abdominal tenderness.  Musculoskeletal:        General: No swelling.     Cervical back: Neck supple.  Skin:    General: Skin is warm and dry.     Capillary Refill: Capillary refill takes less than 2 seconds.  Neurological:     General: No focal deficit present.     Mental Status: He is alert and oriented to person, place, and time.  Psychiatric:        Mood and Affect: Mood normal.     ED Results / Procedures / Treatments   Labs (all labs ordered are listed, but only abnormal results are displayed) Labs Reviewed  CBC WITH DIFFERENTIAL/PLATELET - Abnormal; Notable for the following components:      Result Value   RBC 3.57 (*)    Hemoglobin 11.6 (*)    HCT 34.5 (*)    Platelets 126 (*)    All other components within normal limits  COMPREHENSIVE METABOLIC PANEL - Abnormal; Notable for the following components:   Sodium 133 (*)    Glucose, Bld 140 (*)    BUN 66 (*)    Creatinine, Ser 2.73 (*)    Alkaline Phosphatase 23 (*)    GFR, Estimated 23 (*)    All other components within normal limits  URINALYSIS, ROUTINE W REFLEX MICROSCOPIC - Abnormal; Notable for the following components:   Color, Urine STRAW (*)    Glucose, UA 50 (*)    Hgb urine dipstick SMALL (*)    Bacteria, UA RARE (*)    All other components within normal limits  C DIFFICILE QUICK SCREEN W PCR REFLEX    GASTROINTESTINAL PANEL BY PCR, STOOL (REPLACES STOOL CULTURE)  LIPASE, BLOOD    EKG None  Radiology No results found.  Procedures Procedures    Medications Ordered in ED Medications  lactated ringers bolus 500 mL (has no administration in time range)  0.9 %  sodium chloride infusion (has no administration in  time range)  loperamide (IMODIUM) capsule 2 mg (has no administration in time range)  lactated ringers bolus 500 mL (500 mLs Intravenous New Bag/Given 06/30/23 1329)    ED Course/ Medical Decision Making/ A&P                                 Medical Decision Making This patient presents to  the ED for concern of diarrhea, dehydration, this involves an extensive number of treatment options, and is a complaint that carries with it a high risk of complications and morbidity.  The differential diagnosis includes colitis, viral gastroenteritis, IBS, AKI, other   Co morbidities that complicate the patient evaluation :   Diabetes, hypertension   Additional history obtained:  Additional history obtained from EMR External records from outside source obtained and reviewed including prior notes and labs   Lab Tests:  I Ordered, and personally interpreted labs.  The pertinent results include: No leukocytosis, baseline hemoglobin, AKI noted with BUN 66 and creatinine 2.73      Consultations Obtained:  I requested consultation with the hospitalist Dr. Mariea Clonts,  and discussed lab and imaging findings as well as pertinent plan - they recommend: Admission, will plan to replete GI panel due to duration of symptoms   Problem List / ED Course / Critical interventions / Medication management  AKI-patient having copious diarrhea up to 8 times a day of watery stool for a month, initially he thought it was due to his diabetes medication, but when it was continuous he went to his PCP, he was negative for C. difficile, positive for norovirus but had increase in his creatinine and was told to come to the ER for IV fluids.  He is not having dizziness, no abdominal pain, no blood in the stool.  His creatinine is elevated today at 2.73 with his baseline usually around 1.2, BUN is elevated at 66.  He is given IV fluids, discussed with hospitalist as above for admission.  I have reviewed the patients home  medicines and have made adjustments as needed     Amount and/or Complexity of Data Reviewed Labs: ordered.  Risk Decision regarding hospitalization.           Final Clinical Impression(s) / ED Diagnoses Final diagnoses:  AKI (acute kidney injury) [facility])    Rx / DC Orders ED Discharge Orders     None         Ma Rings, PA-C 06/30/23 1543    Loetta Rough, MD 07/02/23 [phone]

## 2023-06-30 NOTE — ED Notes (Signed)
 Receiving RN has not called with any questions regarding pt- bed has been ready for an hour- this nurse provided courtesy call to 3rd floor stating pt will be transported to admission bed on 3rd floor at this time.

## 2023-07-01 DIAGNOSIS — I130 Hypertensive heart and chronic kidney disease with heart failure and stage 1 through stage 4 chronic kidney disease, or unspecified chronic kidney disease: Secondary | ICD-10-CM | POA: Diagnosis present

## 2023-07-01 DIAGNOSIS — A0811 Acute gastroenteropathy due to Norwalk agent: Secondary | ICD-10-CM | POA: Diagnosis present

## 2023-07-01 DIAGNOSIS — Z8249 Family history of ischemic heart disease and other diseases of the circulatory system: Secondary | ICD-10-CM | POA: Diagnosis not present

## 2023-07-01 DIAGNOSIS — E669 Obesity, unspecified: Secondary | ICD-10-CM

## 2023-07-01 DIAGNOSIS — E871 Hypo-osmolality and hyponatremia: Secondary | ICD-10-CM | POA: Diagnosis present

## 2023-07-01 DIAGNOSIS — E1165 Type 2 diabetes mellitus with hyperglycemia: Secondary | ICD-10-CM | POA: Diagnosis present

## 2023-07-01 DIAGNOSIS — E1122 Type 2 diabetes mellitus with diabetic chronic kidney disease: Secondary | ICD-10-CM | POA: Diagnosis present

## 2023-07-01 DIAGNOSIS — E66811 Obesity, class 1: Secondary | ICD-10-CM | POA: Diagnosis present

## 2023-07-01 DIAGNOSIS — Z833 Family history of diabetes mellitus: Secondary | ICD-10-CM | POA: Diagnosis not present

## 2023-07-01 DIAGNOSIS — E785 Hyperlipidemia, unspecified: Secondary | ICD-10-CM | POA: Diagnosis present

## 2023-07-01 DIAGNOSIS — R197 Diarrhea, unspecified: Secondary | ICD-10-CM | POA: Diagnosis present

## 2023-07-01 DIAGNOSIS — Z7985 Long-term (current) use of injectable non-insulin antidiabetic drugs: Secondary | ICD-10-CM | POA: Diagnosis not present

## 2023-07-01 DIAGNOSIS — A09 Infectious gastroenteritis and colitis, unspecified: Secondary | ICD-10-CM

## 2023-07-01 DIAGNOSIS — I10 Essential (primary) hypertension: Secondary | ICD-10-CM | POA: Diagnosis not present

## 2023-07-01 DIAGNOSIS — E039 Hypothyroidism, unspecified: Secondary | ICD-10-CM | POA: Diagnosis present

## 2023-07-01 DIAGNOSIS — I2510 Atherosclerotic heart disease of native coronary artery without angina pectoris: Secondary | ICD-10-CM | POA: Diagnosis present

## 2023-07-01 DIAGNOSIS — N1831 Chronic kidney disease, stage 3a: Secondary | ICD-10-CM | POA: Diagnosis present

## 2023-07-01 DIAGNOSIS — I5032 Chronic diastolic (congestive) heart failure: Secondary | ICD-10-CM | POA: Diagnosis present

## 2023-07-01 DIAGNOSIS — N400 Enlarged prostate without lower urinary tract symptoms: Secondary | ICD-10-CM | POA: Diagnosis present

## 2023-07-01 DIAGNOSIS — Z7902 Long term (current) use of antithrombotics/antiplatelets: Secondary | ICD-10-CM | POA: Diagnosis not present

## 2023-07-01 DIAGNOSIS — N179 Acute kidney failure, unspecified: Secondary | ICD-10-CM | POA: Diagnosis present

## 2023-07-01 DIAGNOSIS — E860 Dehydration: Secondary | ICD-10-CM | POA: Diagnosis present

## 2023-07-01 DIAGNOSIS — G4733 Obstructive sleep apnea (adult) (pediatric): Secondary | ICD-10-CM | POA: Diagnosis present

## 2023-07-01 DIAGNOSIS — Z79890 Hormone replacement therapy (postmenopausal): Secondary | ICD-10-CM | POA: Diagnosis not present

## 2023-07-01 DIAGNOSIS — Z7982 Long term (current) use of aspirin: Secondary | ICD-10-CM | POA: Diagnosis not present

## 2023-07-01 DIAGNOSIS — Z7984 Long term (current) use of oral hypoglycemic drugs: Secondary | ICD-10-CM | POA: Diagnosis not present

## 2023-07-01 DIAGNOSIS — L409 Psoriasis, unspecified: Secondary | ICD-10-CM | POA: Diagnosis present

## 2023-07-01 DIAGNOSIS — Z6833 Body mass index (BMI) 33.0-33.9, adult: Secondary | ICD-10-CM | POA: Diagnosis not present

## 2023-07-01 LAB — GASTROINTESTINAL PANEL BY PCR, STOOL (REPLACES STOOL CULTURE)

## 2023-07-01 LAB — RENAL FUNCTION PANEL
Albumin: 3.9 g/dL (ref 3.5–5.0)
Anion gap: 11 (ref 5–15)
BUN: 64 mg/dL — ABNORMAL HIGH (ref 8–23)
CO2: 22 mmol/L (ref 22–32)
Calcium: 9.4 mg/dL (ref 8.9–10.3)
Chloride: 104 mmol/L (ref 98–111)
Creatinine, Ser: 2.13 mg/dL — ABNORMAL HIGH (ref 0.61–1.24)
GFR, Estimated: 31 mL/min — ABNORMAL LOW (ref >60)
Glucose, Bld: 91 mg/dL (ref 70–99)
Phosphorus: 3.2 mg/dL (ref 2.5–4.6)
Potassium: 3.4 mmol/L — ABNORMAL LOW (ref 3.5–5.1)
Sodium: 137 mmol/L (ref 135–145)

## 2023-07-01 LAB — GLUCOSE, CAPILLARY
Glucose-Capillary: 98 mg/dL (ref 70–99)
Glucose-Capillary: 97 mg/dL (ref 70–99)
Glucose-Capillary: 166 mg/dL — ABNORMAL HIGH (ref 70–99)
Glucose-Capillary: 139 mg/dL — ABNORMAL HIGH (ref 70–99)

## 2023-07-01 NOTE — Subjective & Objective (Signed)
 Pt seen and examined. Has been having diarrhea for about 2 weeks. Stopped monjauro about 1 week ago. Pt is hungry and wants to eat.

## 2023-07-01 NOTE — Assessment & Plan Note (Signed)
 07-01-2023 due to norovirus. Continue with IVF and supportive care. No anti-diarrheals.  07-02-2023 NO anti-diarrheals. Pt will have to let his infectious viral diarrhea run its course.

## 2023-07-01 NOTE — Assessment & Plan Note (Signed)
 07-01-2023 stable.  07-02-2023 stable.

## 2023-07-01 NOTE — Assessment & Plan Note (Signed)
 07-01-2023 due to norovirus diarrhea and pt still taking ARB/hydrochlorothiazide. Continue with IVF. Repeat BMP in AM.  07-02-2023 await BMP. If SCR back to his baseline(1.2-1.4)normal, can stop his IVF.

## 2023-07-01 NOTE — Assessment & Plan Note (Signed)
 07-01-2023 continue with SSI. Holding Ernest, Dale.  07-02-2023 stable. CBG ranges acceptable.

## 2023-07-01 NOTE — Hospital Course (Signed)
 HPI: Kevin [name] is a 78 y.o. male with medical history significant for history of asthma, CAD, status post remote LAD and circumflex stenting in 1999, 2001,2004, 02/24/2015  , DM2, HTN, HLD, melanoma, microscopic colitis, OSA not using CPAP, Lap-Band surgery  -Patient has had diarrhea since mid January--evaluated by GI team, diarrhea is without mucus or blood -No emesis no fevers --Patient GI/stool workup as follows--fecal calprotectin from 06/26/2023 WNL -C. difficile from 06/26/2023 negative -Fecal pancreatic elastase--on 06/26/2023-- negative -CRP on 06/26/2023 3 -Stool culture/GI pathogen negative except for Norovirus -Repeat C. difficile testing on 06/30/2023-negative -Repeat stool culture/GI pathogen test from 06/30/2023 pending -There was conversation about possibly doing a colonoscopy given history of microscopic colitis- As per patient's primary cardiologist Dr. Layla Maw Dr. Allyson Sabal on 06/24/2023:  Given his stability and low risk nature of the procedure/colonoscopy he is cleared from cardiology. Okay to hold antiplatelets.  - he may hold ASA for 7 days and Plavix for 5  days prior to procedure. Please resume ASA and Plavix as soon as possible postprocedure, at the discretion of the surgeon/endoscopist.   - -LFTs not elevated -Sodium is down to 133 from 143 on 06/26/2023 -Creatinine is up to 2.73 from a baseline around 1.1-1.2 -BUN is up to 66  -Lipase WNL -WBC 5.2 hemoglobin 11.6 which is close to prior baseline platelets 126 -UA not suggestive of UTI    Significant Events: Admitted 06/30/2023 for norovirus diarrhea(PCR positive on 06-26-2023)   Significant Labs: WBC 5.2, HgB 11.6, plt 126 Na 133, K 3.8, CO2 of 23, BUN 66, scr 2.73, glu 140  Significant Imaging Studies:   Antibiotic Therapy: Anti-infectives (From admission, onward)    None       Procedures:   Consultants: GI

## 2023-07-01 NOTE — Plan of Care (Signed)
   Problem: Education: Goal: Knowledge of General Education information will improve Description Including pain rating scale, medication(s)/side effects and non-pharmacologic comfort measures Outcome: Progressing   Problem: Health Behavior/Discharge Planning: Goal: Ability to manage health-related needs will improve Outcome: Progressing

## 2023-07-01 NOTE — Progress Notes (Signed)
 Patient declined BIPAP use tonight. Stated he would call if he changed his mind. No unit in room at this time.

## 2023-07-01 NOTE — Assessment & Plan Note (Addendum)
 07-01-2023 Scr improving. Scr down to 2.13 but his baseline is normal. Continue with IVF. Will need to see his Scr return to near normal before stopping IVF. And then see if pt able to maintain adequate hydration for 24 hours prior to DC. Pt does state he was still taking his CHF diuretic during the last 2 weeks he was having diarrhea. He just remember this today.  So his diuretics combined with volume loss with diarrhea explains his profound AKI.  07-02-2023 check BMP today today. May be able to stop IVF if Scr back to normal. Pt is tolerating PO diet well.

## 2023-07-01 NOTE — Assessment & Plan Note (Addendum)
 07-01-2023 stable. On pravastatin, niacin, zetia and asa.  07-02-2023 stable.

## 2023-07-01 NOTE — Assessment & Plan Note (Signed)
 07-01-2023 pt was still taking his ARB/hydrochlorothiazide during his diarrheal illness that past 2 weeks. Likely explains his AKI along with his Norovirus diarrhea. Monitor BP for now.  07-02-2023 continue to monitor BP off HTN meds.

## 2023-07-01 NOTE — Progress Notes (Signed)
 PROGRESS NOTE    Kevin [name]  BJY:[id number] DOB: 1946/02/26 DOA: 06/30/2023 PCP: Assunta Found, MD  Subjective: Pt seen and examined. Has been having diarrhea for about 2 weeks. Stopped monjauro about 1 week ago. Pt is hungry and wants to eat.   Hospital Course: HPI: Kevin [name] is a 78 y.o. male with medical history significant for history of asthma, CAD, status post remote LAD and circumflex stenting in 1999, 2001,2004, 02/24/2015  , DM2, HTN, HLD, melanoma, microscopic colitis, OSA not using CPAP, Lap-Band surgery  -Patient has had diarrhea since mid January--evaluated by GI team, diarrhea is without mucus or blood -No emesis no fevers --Patient GI/stool workup as follows--fecal calprotectin from 06/26/2023 WNL -C. difficile from 06/26/2023 negative -Fecal pancreatic elastase--on 06/26/2023-- negative -CRP on 06/26/2023 3 -Stool culture/GI pathogen negative except for Norovirus -Repeat C. difficile testing on 06/30/2023-negative -Repeat stool culture/GI pathogen test from 06/30/2023 pending -There was conversation about possibly doing a colonoscopy given history of microscopic colitis- As per patient's primary cardiologist Dr. Layla Maw Dr. Allyson Sabal on 06/24/2023:  Given his stability and low risk nature of the procedure/colonoscopy he is cleared from cardiology. Okay to hold antiplatelets.  - he may hold ASA for 7 days and Plavix for 5  days prior to procedure. Please resume ASA and Plavix as soon as possible postprocedure, at the discretion of the surgeon/endoscopist.   - -LFTs not elevated -Sodium is down to 133 from 143 on 06/26/2023 -Creatinine is up to 2.73 from a baseline around 1.1-1.2 -BUN is up to 66  -Lipase WNL -WBC 5.2 hemoglobin 11.6 which is close to prior baseline platelets 126 -UA not suggestive of UTI    Significant Events: Admitted 06/30/2023 for norovirus diarrhea(PCR positive on 06-26-2023)   Significant Labs: WBC 5.2, HgB 11.6, plt 126 Na 133, K 3.8, CO2 of  23, BUN 66, scr 2.73, glu 140  Significant Imaging Studies:   Antibiotic Therapy: Anti-infectives (From admission, onward)    None       Procedures:   Consultants: GI    Assessment and Plan: * AKI (acute kidney injury) (HCC) 07-01-2023 Scr improving. Scr down to 2.13 but his baseline is normal. Continue with IVF. Will need to see his Scr return to near normal before stopping IVF. And then see if pt able to maintain adequate hydration for 24 hours prior to DC. Pt does state he was still taking his CHF diuretic during the last 2 weeks he was having diarrhea. He just remember this today.  So his diuretics combined with volume loss with diarrhea explains his profound AKI.  Enteritis due to Norovirus 07-01-2023 GI diarrhea panel POSITIVE for Norovirus. Continue enteric isolation. Do not use anti-diarrheal. Discussed with pt that his diarrhea will just have to run its course.  Acute renal failure superimposed on stage 3a chronic kidney disease (HCC) 07-01-2023 due to norovirus diarrhea and pt still taking ARB/hydrochlorothiazide. Continue with IVF. Repeat BMP in AM.  Acute infectious diarrhea - due to norovirus 07-01-2023 due to norovirus. Continue with IVF and supportive care. No anti-diarrheals.  Type 2 diabetes mellitus with hyperglycemia (HCC) 07-01-2023 continue with SSI. Holding [place], [place].  Obesity (BMI 30-39.9) Estimated body mass index is 33.17 kg/m as calculated from the following:   Height as of this encounter: 5\' 9"  (1.753 m).   Weight as of this encounter: 101.9 kg.   OSA on CPAP 07-01-2023 stable.  Essential hypertension 07-01-2023 pt was still taking his ARB/hydrochlorothiazide during his diarrheal illness that past 2 weeks.  Likely explains his AKI along with his Norovirus diarrhea. Monitor BP for now.  Coronary artery disease, hx LAD and LCX stenting in 2001 & 2004, last cath 2009 with patent stents. 07-01-2023 stable. On pravastatin, niacin, zetia  and asa.  DVT prophylaxis: SCDs Start: 06/30/23 2014 Place TED hose Start: 06/30/23 2014    Code Status: Full Code Family Communication: no family at bedside Disposition Plan: return home Reason for continuing need for hospitalization: remains on IVF.  Objective: Vitals:   06/30/23 2229 07/01/23 0400 07/01/23 0640 07/01/23 1217  BP: (!) 142/78 110/62 114/71 119/62  Pulse: 65 72 69 74  Resp: 20 18 18 17   Temp: 97.7 F (36.5 C) 98.1 F (36.7 C) 97.9 F (36.6 C) 98.1 F (36.7 C)  TempSrc: Oral Oral Oral   SpO2: 100% 100% 100% 100%  Weight: 101.9 kg     Height: 5\' 9"  (1.753 m)       Intake/Output Summary (Last 24 hours) at 07/01/2023 1250 Last data filed at 07/01/2023 0457 Gross per 24 hour  Intake 240 ml  Output --  Net 240 ml   Filed Weights   06/30/23 1144 06/30/23 2229  Weight: 101.6 kg 101.9 kg    Examination:  Physical Exam Vitals and nursing note reviewed.  Constitutional:      General: He is not in acute distress.    Appearance: Normal appearance. He is obese. He is not toxic-appearing or diaphoretic.  HENT:     Head: Normocephalic and atraumatic.     Nose: Nose normal.  Eyes:     General: No scleral icterus. Cardiovascular:     Rate and Rhythm: Normal rate and regular rhythm.  Pulmonary:     Effort: Pulmonary effort is normal.     Breath sounds: Normal breath sounds.  Abdominal:     General: Bowel sounds are normal. There is no distension.     Palpations: Abdomen is soft.     Tenderness: There is no abdominal tenderness. There is no guarding or rebound.     Hernia: A hernia is present.  Musculoskeletal:     Right lower leg: No edema.     Left lower leg: No edema.  Skin:    General: Skin is warm and dry.     Capillary Refill: Capillary refill takes less than 2 seconds.  Neurological:     General: No focal deficit present.     Mental Status: He is alert and oriented to person, place, and time.     Data Reviewed: I have personally reviewed  following labs and imaging studies  CBC: Recent Labs  Lab 06/26/23 0902 06/30/23 1325  WBC 4.2 5.2  NEUTROABS  --  2.5  HGB 11.7* 11.6*  HCT 34.9* 34.5*  MCV 97 96.6  PLT 121* 126*   Basic Metabolic Panel: Recent Labs  Lab 06/26/23 0902 06/30/23 1325 07/01/23 0352  NA 143 133* 137  K 4.4 3.8 3.4*  CL 103 98 104  CO2 22 23 22   GLUCOSE 163* 140* 91  BUN 59* 66* 64*  CREATININE 2.72* 2.73* 2.13*  CALCIUM 10.4* 9.9 9.4  PHOS  --   --  3.2   GFR: Estimated Creatinine Clearance: 34.7 mL/min (A) (by C-G formula based on SCr of 2.13 mg/dL (H)). Liver Function Tests: Recent Labs  Lab 06/26/23 0902 06/30/23 1325 07/01/23 0352  AST 8 16  --   ALT 9 12  --   ALKPHOS 30* 23*  --   BILITOT 0.7 1.0  --  PROT 6.9 7.7  --   ALBUMIN 4.8 4.6 3.9   Recent Labs  Lab 06/30/23 1325  LIPASE 50   CBG: Recent Labs  Lab 06/30/23 2152 07/01/23 0730 07/01/23 1059  GLUCAP 146* 98 97    Recent Results (from the past 240 hours)  Calprotectin, Fecal     Status: None   Collection Time: 06/26/23  9:02 AM   Specimen: Stool  Result Value Ref Range Status   Calprotectin, Fecal 45 0 - 120 ug/g Final    Comment: Concentration     Interpretation   Follow-Up < 5 - 50 ug/g     Normal           None >50 -120 ug/g     Borderline       Re-evaluate in 4-6 weeks     >120 ug/g     Abnormal         Repeat as clinically                                    indicated   Clostridium Difficile by PCR     Status: None   Collection Time: 06/26/23  9:10 AM   Specimen: STOOL   ST  Result Value Ref Range Status   Toxigenic C. Difficile by PCR Negative Negative Final  C Difficile Quick Screen w PCR reflex     Status: None   Collection Time: 06/30/23  5:39 PM   Specimen: STOOL  Result Value Ref Range Status   C Diff antigen NEGATIVE NEGATIVE Final   C Diff toxin NEGATIVE NEGATIVE Final   C Diff interpretation No C. difficile detected.  Final    Comment: Performed at [facility], [address], [place], [place] [address]  Gastrointestinal Panel by PCR , Stool     Status: Abnormal   Collection Time: 06/30/23  5:39 PM   Specimen: STOOL  Result Value Ref Range Status   Campylobacter species NOT DETECTED NOT DETECTED Final   Plesimonas shigelloides NOT DETECTED NOT DETECTED Final   Salmonella species NOT DETECTED NOT DETECTED Final   Yersinia enterocolitica NOT DETECTED NOT DETECTED Final   Vibrio species NOT DETECTED NOT DETECTED Final   Vibrio cholerae NOT DETECTED NOT DETECTED Final   Enteroaggregative E coli (EAEC) NOT DETECTED NOT DETECTED Final   Enteropathogenic E coli (EPEC) NOT DETECTED NOT DETECTED Final   Enterotoxigenic E coli (ETEC) NOT DETECTED NOT DETECTED Final   Shiga like toxin producing E coli (STEC) NOT DETECTED NOT DETECTED Final   Shigella/Enteroinvasive E coli (EIEC) NOT DETECTED NOT DETECTED Final   Cryptosporidium NOT DETECTED NOT DETECTED Final   Cyclospora cayetanensis NOT DETECTED NOT DETECTED Final   Entamoeba histolytica NOT DETECTED NOT DETECTED Final   Giardia lamblia NOT DETECTED NOT DETECTED Final   Adenovirus F40/41 NOT DETECTED NOT DETECTED Final   Astrovirus NOT DETECTED NOT DETECTED Final   Norovirus GI/GII DETECTED (A) NOT DETECTED Final    Comment: RESULT CALLED TO, READ BACK BY AND VERIFIED WITH: MARIANNE HOWERTON 07/01/23 1104 MW    Rotavirus A NOT DETECTED NOT DETECTED Final   Sapovirus (I, II, IV, and V) NOT DETECTED NOT DETECTED Final    Comment: Performed at [facility], [address] [place], [place] [address]     Radiology Studies: No results found.  Scheduled Meds:  aspirin  81 mg Oral Daily   ezetimibe  10  mg Oral Daily   insulin aspart  0-5 Units Subcutaneous QHS   insulin aspart  0-9 Units Subcutaneous TID WC   levothyroxine  50 mcg Oral Q0600   niacin  1,000 mg Oral QHS   pravastatin  40 mg Oral QPM   sodium chloride flush  3 mL Intravenous Q12H   sodium chloride flush  3 mL Intravenous Q12H   tamsulosin   0.4 mg Oral Daily   Continuous Infusions:  sodium chloride 125 mL/hr at 07/01/23 4098   sodium chloride       LOS: 0 days   Time spent: 40 minutes  Carollee Herter, DO  [facility]  07/01/2023, 12:50 PM

## 2023-07-01 NOTE — Assessment & Plan Note (Signed)
 Estimated body mass index is 33.17 kg/m as calculated from the following:   Height as of this encounter: 5\' 9"  (1.753 m).   Weight as of this encounter: 101.9 kg.

## 2023-07-01 NOTE — Assessment & Plan Note (Signed)
 07-01-2023 GI diarrhea panel POSITIVE for Norovirus. Continue enteric isolation. Do not use anti-diarrheal. Discussed with pt that his diarrhea will just have to run its course.  07-02-2023 NO anti-diarrheals. Pt will have to let his infectious viral diarrhea run its course.

## 2023-07-01 NOTE — TOC CM/SW Note (Signed)
 Transition of Care [facility]) - Inpatient Brief Assessment   Patient Details  Name: Kevin [name] MRN: [id number] Date of Birth: 1945-05-15  Transition of Care [facility]) CM/SW Contact:    Villa Herb, LCSWA Phone Number: 07/01/2023, 9:10 AM   Clinical Narrative: Transition of Care Department [facility]) has reviewed patient and no TOC needs have been identified at this time. We will continue to monitor patient advancement through interdiciplinary progression rounds. If new patient transition needs arise, please place a TOC consult.   Transition of Care Asessment: Insurance and Status: Insurance coverage has been reviewed Patient has primary care physician: Yes Home environment has been reviewed: From home Prior level of function:: Independent Prior/Current Home Services: No current home services Social Drivers of Health Review: SDOH reviewed no interventions necessary Readmission risk has been reviewed: Yes Transition of care needs: no transition of care needs at this time

## 2023-07-02 DIAGNOSIS — E1165 Type 2 diabetes mellitus with hyperglycemia: Secondary | ICD-10-CM

## 2023-07-02 DIAGNOSIS — A0811 Acute gastroenteropathy due to Norwalk agent: Secondary | ICD-10-CM | POA: Diagnosis not present

## 2023-07-02 DIAGNOSIS — A09 Infectious gastroenteritis and colitis, unspecified: Secondary | ICD-10-CM | POA: Diagnosis not present

## 2023-07-02 DIAGNOSIS — I10 Essential (primary) hypertension: Secondary | ICD-10-CM | POA: Diagnosis not present

## 2023-07-02 DIAGNOSIS — N179 Acute kidney failure, unspecified: Secondary | ICD-10-CM | POA: Diagnosis not present

## 2023-07-02 LAB — GLUCOSE, CAPILLARY
Glucose-Capillary: 101 mg/dL — ABNORMAL HIGH (ref 70–99)
Glucose-Capillary: 142 mg/dL — ABNORMAL HIGH (ref 70–99)
Glucose-Capillary: 102 mg/dL — ABNORMAL HIGH (ref 70–99)
Glucose-Capillary: 132 mg/dL — ABNORMAL HIGH (ref 70–99)

## 2023-07-02 LAB — COMPREHENSIVE METABOLIC PANEL
ALT: 10 U/L (ref 0–44)
AST: 13 U/L — ABNORMAL LOW (ref 15–41)
Albumin: 3.8 g/dL (ref 3.5–5.0)
Alkaline Phosphatase: 20 U/L — ABNORMAL LOW (ref 38–126)
Anion gap: 8 (ref 5–15)
BUN: 47 mg/dL — ABNORMAL HIGH (ref 8–23)
CO2: 23 mmol/L (ref 22–32)
Calcium: 9.5 mg/dL (ref 8.9–10.3)
Chloride: 108 mmol/L (ref 98–111)
Creatinine, Ser: 1.72 mg/dL — ABNORMAL HIGH (ref 0.61–1.24)
GFR, Estimated: 41 mL/min — ABNORMAL LOW (ref >60)
Glucose, Bld: 115 mg/dL — ABNORMAL HIGH (ref 70–99)
Potassium: 4.2 mmol/L (ref 3.5–5.1)
Sodium: 139 mmol/L (ref 135–145)
Total Bilirubin: 0.8 mg/dL (ref 0.0–1.2)
Total Protein: 6.4 g/dL — ABNORMAL LOW (ref 6.5–8.1)

## 2023-07-02 LAB — HEMOGLOBIN A1C
Hgb A1c MFr Bld: 6.4 % — ABNORMAL HIGH (ref 4.8–5.6)
Mean Plasma Glucose: 137 mg/dL

## 2023-07-02 MED ORDER — SODIUM CHLORIDE 0.9 % IV SOLN: INTRAVENOUS | Status: AC

## 2023-07-02 NOTE — Progress Notes (Signed)
 Patient declining BIPAP at this time. No unit in room.

## 2023-07-02 NOTE — Progress Notes (Signed)
 PROGRESS NOTE    Kevin [name]  ZOX:[id number] DOB: 1945/09/24 DOA: 06/30/2023 PCP: Assunta Found, MD  Subjective: Pt seen and examined.  Pt thinks he had about 6-8 diarrheal stools yesterday. Had BM around 9 pm last night and another one around 4 AM this AM. Tolerating food without N/V. Feels better than yesterday. No fevers. GI diarrhea panel positive for Norovirus. On GI soft diet.   Hospital Course: HPI: Kevin [name] is a 78 y.o. male with medical history significant for history of asthma, CAD, status post remote LAD and circumflex stenting in 1999, 2001,2004, 02/24/2015  , DM2, HTN, HLD, melanoma, microscopic colitis, OSA not using CPAP, Lap-Band surgery  -Patient has had diarrhea since mid January--evaluated by GI team, diarrhea is without mucus or blood -No emesis no fevers --Patient GI/stool workup as follows--fecal calprotectin from 06/26/2023 WNL -C. difficile from 06/26/2023 negative -Fecal pancreatic elastase--on 06/26/2023-- negative -CRP on 06/26/2023 3 -Stool culture/GI pathogen negative except for Norovirus -Repeat C. difficile testing on 06/30/2023-negative -Repeat stool culture/GI pathogen test from 06/30/2023 pending -There was conversation about possibly doing a colonoscopy given history of microscopic colitis- As per patient's primary cardiologist Dr. Layla Maw Dr. Allyson Sabal on 06/24/2023:  Given his stability and low risk nature of the procedure/colonoscopy he is cleared from cardiology. Okay to hold antiplatelets.  - he may hold ASA for 7 days and Plavix for 5  days prior to procedure. Please resume ASA and Plavix as soon as possible postprocedure, at the discretion of the surgeon/endoscopist.   - -LFTs not elevated -Sodium is down to 133 from 143 on 06/26/2023 -Creatinine is up to 2.73 from a baseline around 1.1-1.2 -BUN is up to 66  -Lipase WNL -WBC 5.2 hemoglobin 11.6 which is close to prior baseline platelets 126 -UA not suggestive of UTI    Significant  Events: Admitted 06/30/2023 for norovirus diarrhea(PCR positive on 06-26-2023)   Significant Labs: WBC 5.2, HgB 11.6, plt 126 Na 133, K 3.8, CO2 of 23, BUN 66, scr 2.73, glu 140  Significant Imaging Studies:   Antibiotic Therapy: Anti-infectives (From admission, onward)    None       Procedures:   Consultants: GI    Assessment and Plan: * AKI (acute kidney injury) (HCC) 07-01-2023 Scr improving. Scr down to 2.13 but his baseline is normal. Continue with IVF. Will need to see his Scr return to near normal before stopping IVF. And then see if pt able to maintain adequate hydration for 24 hours prior to DC. Pt does state he was still taking his CHF diuretic during the last 2 weeks he was having diarrhea. He just remember this today.  So his diuretics combined with volume loss with diarrhea explains his profound AKI.  07-02-2023 check BMP today today. May be able to stop IVF if Scr back to normal. Pt is tolerating PO diet well.  Enteritis due to Norovirus 07-01-2023 GI diarrhea panel POSITIVE for Norovirus. Continue enteric isolation. Do not use anti-diarrheal. Discussed with pt that his diarrhea will just have to run its course.  07-02-2023 NO anti-diarrheals. Pt will have to let his infectious viral diarrhea run its course.  Acute renal failure superimposed on stage 3a chronic kidney disease (HCC) - baseline SCr (1.2-1.4) 07-01-2023 due to norovirus diarrhea and pt still taking ARB/hydrochlorothiazide. Continue with IVF. Repeat BMP in AM.  07-02-2023 await BMP. If SCR back to his baseline(1.2-1.4)normal, can stop his IVF.  Acute infectious diarrhea - due to norovirus 07-01-2023 due to norovirus. Continue with IVF  and supportive care. No anti-diarrheals.  07-02-2023 NO anti-diarrheals. Pt will have to let his infectious viral diarrhea run its course.   Type 2 diabetes mellitus with hyperglycemia (HCC) 07-01-2023 continue with SSI. Holding [place], [place].  07-02-2023  stable. CBG ranges acceptable.  Obesity (BMI 30-39.9) Estimated body mass index is 33.17 kg/m as calculated from the following:   Height as of this encounter: 5\' 9"  (1.753 m).   Weight as of this encounter: 101.9 kg.   OSA on CPAP 07-01-2023 stable.  07-02-2023 stable.  Essential hypertension 07-01-2023 pt was still taking his ARB/hydrochlorothiazide during his diarrheal illness that past 2 weeks. Likely explains his AKI along with his Norovirus diarrhea. Monitor BP for now.  07-02-2023 continue to monitor BP off HTN meds.  Coronary artery disease, hx LAD and LCX stenting in 2001 & 2004, last cath 2009 with patent stents. 07-01-2023 stable. On pravastatin, niacin, zetia and asa.  07-02-2023 stable.  DVT prophylaxis: SCDs Start: 06/30/23 2014 Place TED hose Start: 06/30/23 2014    Code Status: Full Code Family Communication: no family at bedside. Pt is decisional. Disposition Plan: return home Reason for continuing need for hospitalization: repeat BMP today. Will need to monitor off IVF for 24 hours to see if he can maintain adequate PO hydration.  Objective: Vitals:   07/01/23 2210 07/02/23 0132 07/02/23 0500 07/02/23 0641  BP: 126/64 130/70 (!) 112/57 122/63  Pulse: 73 78 74 76  Resp:  18 17 18   Temp: (!) 97.5 F (36.4 C) 97.7 F (36.5 C) 97.7 F (36.5 C) 97.9 F (36.6 C)  TempSrc:  Oral Oral Oral  SpO2: 98% 100% 99% 100%  Weight:      Height:        Intake/Output Summary (Last 24 hours) at 07/02/2023 0829 Last data filed at 07/02/2023 0503 Gross per 24 hour  Intake 4254.05 ml  Output --  Net 4254.05 ml   Filed Weights   06/30/23 1144 06/30/23 2229  Weight: 101.6 kg 101.9 kg    Examination:  Physical Exam Vitals and nursing note reviewed.  Constitutional:      General: He is not in acute distress.    Appearance: He is not ill-appearing, toxic-appearing or diaphoretic.     Comments: Looks better than yesterday  HENT:     Head: Normocephalic and  atraumatic.     Nose: Nose normal.  Eyes:     General: No scleral icterus. Cardiovascular:     Rate and Rhythm: Normal rate and regular rhythm.  Pulmonary:     Effort: Pulmonary effort is normal.     Breath sounds: Normal breath sounds.  Abdominal:     General: Bowel sounds are normal. There is no distension.     Palpations: Abdomen is soft.     Tenderness: There is no abdominal tenderness. There is no guarding or rebound.  Musculoskeletal:     Right lower leg: No edema.     Left lower leg: No edema.  Skin:    General: Skin is warm and dry.     Capillary Refill: Capillary refill takes less than 2 seconds.  Neurological:     Mental Status: He is alert and oriented to person, place, and time.     Data Reviewed: I have personally reviewed following labs and imaging studies  CBC: Recent Labs  Lab 06/26/23 0902 06/30/23 1325  WBC 4.2 5.2  NEUTROABS  --  2.5  HGB 11.7* 11.6*  HCT 34.9* 34.5*  MCV 97 96.6  PLT  121* 126*   Basic Metabolic Panel: Recent Labs  Lab 06/26/23 0902 06/30/23 1325 07/01/23 0352  NA 143 133* 137  K 4.4 3.8 3.4*  CL 103 98 104  CO2 22 23 22   GLUCOSE 163* 140* 91  BUN 59* 66* 64*  CREATININE 2.72* 2.73* 2.13*  CALCIUM 10.4* 9.9 9.4  PHOS  --   --  3.2   GFR: Estimated Creatinine Clearance: 34.7 mL/min (A) (by C-G formula based on SCr of 2.13 mg/dL (H)). Liver Function Tests: Recent Labs  Lab 06/26/23 0902 06/30/23 1325 07/01/23 0352  AST 8 16  --   ALT 9 12  --   ALKPHOS 30* 23*  --   BILITOT 0.7 1.0  --   PROT 6.9 7.7  --   ALBUMIN 4.8 4.6 3.9   Recent Labs  Lab 06/30/23 1325  LIPASE 50   HbA1C: Recent Labs    06/30/23 1325  HGBA1C 6.4*   CBG: Recent Labs  Lab 07/01/23 0730 07/01/23 1059 07/01/23 1615 07/01/23 2010 07/02/23 0738  GLUCAP 98 97 166* 139* 101*   Recent Results (from the past 240 hours)  Calprotectin, Fecal     Status: None   Collection Time: 06/26/23  9:02 AM   Specimen: Stool  Result Value Ref  Range Status   Calprotectin, Fecal 45 0 - 120 ug/g Final    Comment: Concentration     Interpretation   Follow-Up < 5 - 50 ug/g     Normal           None >50 -120 ug/g     Borderline       Re-evaluate in 4-6 weeks     >120 ug/g     Abnormal         Repeat as clinically                                    indicated   Clostridium Difficile by PCR     Status: None   Collection Time: 06/26/23  9:10 AM   Specimen: STOOL   ST  Result Value Ref Range Status   Toxigenic C. Difficile by PCR Negative Negative Final  C Difficile Quick Screen w PCR reflex     Status: None   Collection Time: 06/30/23  5:39 PM   Specimen: STOOL  Result Value Ref Range Status   C Diff antigen NEGATIVE NEGATIVE Final   C Diff toxin NEGATIVE NEGATIVE Final   C Diff interpretation No C. difficile detected.  Final    Comment: Performed at [facility], [address], [place], [place] [address]  Gastrointestinal Panel by PCR , Stool     Status: Abnormal   Collection Time: 06/30/23  5:39 PM   Specimen: STOOL  Result Value Ref Range Status   Campylobacter species NOT DETECTED NOT DETECTED Final   Plesimonas shigelloides NOT DETECTED NOT DETECTED Final   Salmonella species NOT DETECTED NOT DETECTED Final   Yersinia enterocolitica NOT DETECTED NOT DETECTED Final   Vibrio species NOT DETECTED NOT DETECTED Final   Vibrio cholerae NOT DETECTED NOT DETECTED Final   Enteroaggregative E coli (EAEC) NOT DETECTED NOT DETECTED Final   Enteropathogenic E coli (EPEC) NOT DETECTED NOT DETECTED Final   Enterotoxigenic E coli (ETEC) NOT DETECTED NOT DETECTED Final   Shiga like toxin producing E coli (STEC) NOT DETECTED NOT DETECTED Final   Shigella/Enteroinvasive E coli (EIEC) NOT DETECTED  NOT DETECTED Final   Cryptosporidium NOT DETECTED NOT DETECTED Final   Cyclospora cayetanensis NOT DETECTED NOT DETECTED Final   Entamoeba histolytica NOT DETECTED NOT DETECTED Final   Giardia lamblia NOT DETECTED NOT DETECTED Final   Adenovirus  F40/41 NOT DETECTED NOT DETECTED Final   Astrovirus NOT DETECTED NOT DETECTED Final   Norovirus GI/GII DETECTED (A) NOT DETECTED Final    Comment: RESULT CALLED TO, READ BACK BY AND VERIFIED WITH: MARIANNE HOWERTON 07/01/23 1104 MW    Rotavirus A NOT DETECTED NOT DETECTED Final   Sapovirus (I, II, IV, and V) NOT DETECTED NOT DETECTED Final    Comment: Performed at [facility], [address] [place], [place] [address]     Radiology Studies: No results found.  Scheduled Meds:  aspirin  81 mg Oral Daily   ezetimibe  10 mg Oral Daily   insulin aspart  0-5 Units Subcutaneous QHS   insulin aspart  0-9 Units Subcutaneous TID WC   levothyroxine  50 mcg Oral Q0600   niacin  1,000 mg Oral QHS   pravastatin  40 mg Oral QPM   sodium chloride flush  3 mL Intravenous Q12H   sodium chloride flush  3 mL Intravenous Q12H   tamsulosin  0.4 mg Oral Daily   Continuous Infusions:   LOS: 1 day   Time spent: 40 minutes  Carollee Herter, DO  [facility]  07/02/2023, 8:29 AM

## 2023-07-02 NOTE — Plan of Care (Signed)

## 2023-07-02 NOTE — Plan of Care (Signed)
   Problem: Education: Goal: Knowledge of General Education information will improve Description Including pain rating scale, medication(s)/side effects and non-pharmacologic comfort measures Outcome: Progressing   Problem: Health Behavior/Discharge Planning: Goal: Ability to manage health-related needs will improve Outcome: Progressing

## 2023-07-03 DIAGNOSIS — I2510 Atherosclerotic heart disease of native coronary artery without angina pectoris: Secondary | ICD-10-CM

## 2023-07-03 DIAGNOSIS — E669 Obesity, unspecified: Secondary | ICD-10-CM | POA: Diagnosis not present

## 2023-07-03 DIAGNOSIS — A0811 Acute gastroenteropathy due to Norwalk agent: Secondary | ICD-10-CM | POA: Diagnosis not present

## 2023-07-03 DIAGNOSIS — G4733 Obstructive sleep apnea (adult) (pediatric): Secondary | ICD-10-CM

## 2023-07-03 DIAGNOSIS — N179 Acute kidney failure, unspecified: Secondary | ICD-10-CM | POA: Diagnosis not present

## 2023-07-03 DIAGNOSIS — I10 Essential (primary) hypertension: Secondary | ICD-10-CM | POA: Diagnosis not present

## 2023-07-03 LAB — COMPREHENSIVE METABOLIC PANEL
ALT: 9 U/L (ref 0–44)
AST: 12 U/L — ABNORMAL LOW (ref 15–41)
Albumin: 3.7 g/dL (ref 3.5–5.0)
Alkaline Phosphatase: 18 U/L — ABNORMAL LOW (ref 38–126)
Anion gap: 4 — ABNORMAL LOW (ref 5–15)
BUN: 34 mg/dL — ABNORMAL HIGH (ref 8–23)
CO2: 21 mmol/L — ABNORMAL LOW (ref 22–32)
Calcium: 9.2 mg/dL (ref 8.9–10.3)
Chloride: 115 mmol/L — ABNORMAL HIGH (ref 98–111)
Creatinine, Ser: 1.53 mg/dL — ABNORMAL HIGH (ref 0.61–1.24)
GFR, Estimated: 47 mL/min — ABNORMAL LOW (ref >60)
Glucose, Bld: 94 mg/dL (ref 70–99)
Potassium: 3.9 mmol/L (ref 3.5–5.1)
Sodium: 140 mmol/L (ref 135–145)
Total Bilirubin: 0.9 mg/dL (ref 0.0–1.2)
Total Protein: 6.2 g/dL — ABNORMAL LOW (ref 6.5–8.1)

## 2023-07-03 LAB — GLUCOSE, CAPILLARY
Glucose-Capillary: 103 mg/dL — ABNORMAL HIGH (ref 70–99)
Glucose-Capillary: 145 mg/dL — ABNORMAL HIGH (ref 70–99)

## 2023-07-03 MED ORDER — FUROSEMIDE 40 MG PO TABS: 40.0000 mg | ORAL_TABLET | Freq: Every day | ORAL | Status: DC

## 2023-07-03 MED ORDER — VALSARTAN 40 MG PO TABS: 40.0000 mg | ORAL_TABLET | Freq: Every day | ORAL | 2 refills | Status: DC

## 2023-07-03 NOTE — Plan of Care (Signed)

## 2023-07-03 NOTE — Plan of Care (Signed)
   Problem: Education: Goal: Knowledge of General Education information will improve Description: Including pain rating scale, medication(s)/side effects and non-pharmacologic comfort measures Outcome: Progressing   Problem: Activity: Goal: Risk for activity intolerance will decrease Outcome: Progressing

## 2023-07-03 NOTE — Care Management Important Message (Signed)
 Important Message  Patient Details  Name: Kevin [name] MRN: [id number] Date of Birth: December 17, 1945   Important Message Given:  Yes - Medicare IM     Corey Harold 07/03/2023, 5:08 PM

## 2023-07-03 NOTE — Discharge Summary (Signed)
 Physician Discharge Summary   Patient: Kevin [name] MRN: [id number] DOB: [birth year]  Admit date:     06/30/2023  Discharge date: 07/03/23  Discharge Physician: Vassie Loll   PCP: Assunta Found, MD   Recommendations at discharge:  Repeat metabolic panel to follow ultralights renal function Reassess blood pressure and adjust antihypertensive treatment as needed Continue assisting patient with weight loss management and volume control Make sure patient follow-up with gastroenterology and cardiology service as instructed.  Discharge Diagnoses: Principal Problem:   AKI (acute kidney injury) (HCC) Active Problems:   Enteritis due to Norovirus   Acute infectious diarrhea - due to norovirus   Acute renal failure superimposed on stage 3a chronic kidney disease (HCC) - baseline SCr (1.2-1.4)   Coronary artery disease, hx LAD and LCX stenting in 2001 & 2004, last cath 2009 with patent stents.   Essential hypertension   OSA on CPAP   Obesity (BMI 30-39.9)   Type 2 diabetes mellitus with hyperglycemia [facility])  Brief Hospital admission narrative: As per H&P written by Dr. Mariea Clonts on 06/30/2023 Kevin [name] is a 78 y.o. male with medical history significant for history of asthma, CAD, status post remote LAD and circumflex stenting in 1999, 2001,2004, 02/24/2015  , DM2, HTN, HLD, melanoma, microscopic colitis, OSA not using CPAP, Lap-Band surgery  -Patient has had diarrhea since mid January--evaluated by GI team, diarrhea is without mucus or blood -No emesis no fevers --Patient GI/stool workup as follows--fecal calprotectin from 06/26/2023 WNL -C. difficile from 06/26/2023 negative -Fecal pancreatic elastase--on 06/26/2023-- negative -CRP on 06/26/2023 3 -Stool culture/GI pathogen negative except for Norovirus -Repeat C. difficile testing on 06/30/2023-negative -Repeat stool culture/GI pathogen test from 06/30/2023 pending -There was conversation about possibly doing a colonoscopy given history of  microscopic colitis- As per patient's primary cardiologist Dr. Layla Maw Dr. Allyson Sabal on 06/24/2023:  Given his stability and low risk nature of the procedure/colonoscopy he is cleared from cardiology. Okay to hold antiplatelets.  - he may hold ASA for 7 days and Plavix for 5  days prior to procedure. Please resume ASA and Plavix as soon as possible postprocedure, at the discretion of the surgeon/endoscopist.   - -LFTs not elevated -Sodium is down to 133 from 143 on 06/26/2023 -Creatinine is up to 2.73 from a baseline around 1.1-1.2 -BUN is up to 66  -Lipase WNL -WBC 5.2 hemoglobin 11.6 which is close to prior baseline platelets 126 -UA not suggestive of UTI  Assessment and Plan: * AKI (acute kidney injury) (HCC) -Patient condition is stabilized and creatinine pretty much back to his baseline at discharge -Still holding diuretics and ARB for another 48 hours after discharge for further stability -Patient advised to maintain adequate hydration -Tolerating diet without any problems and will follow-up with PCP in the next 10 days. -Outpatient follow-up with gastroenterology recommended.  Enteritis due to Norovirus -Hemodynamically stable and tolerating diet -No need for antibiotic therapy -Continue supportive care until patient's condition run its course out.  Acute renal failure superimposed on stage 3a chronic kidney disease (HCC) - baseline SCr (1.2-1.4) -Baseline creatinine around 1.4; at discharge creatinine 1.5 and improving -Patient advised to maintain adequate hydration and to avoid dehydration -Nephrotoxic agents to be resume as instructed in the next 48 hours after discharge -Continue close outpatient follow-up with PCP to further follow renal function trend/stability.  Acute infectious diarrhea - due to norovirus -due to norovirus. Continue with supportive care and adequate hydration. No anti-diarrheals. -Pt will have to let his infectious viral diarrhea run  its course.   -Continue patient follow-up with GI service.  Type 2 diabetes mellitus with hyperglycemia (HCC) -Modified carbohydrate diet discussed with patient -Continue the use of Januvia, Mounjaro. -Outpatient follow-up with PCP to follow CBG fluctuation/A1c with further adjustment to hypoglycemia regimen as required.  Class I obesity (BMI 30-39.9) -Body mass index is 33.17 kg/m.  -Low-calorie diet and portion control discussed with patient.  OSA on CPAP -Stable -Continue CPAP nightly.  Essential hypertension -Stable and rising -Resume home antihypertensive agents as instructed.  Coronary artery disease, hx LAD and LCX stenting in 2001 & 2004, last cath 2009 with patent stents. -Resume the use of Plavix, aspirin, ARB and a statin -Continue patient follow-up with cardiology service.  Chronic diastolic heart failure -Compensated -Continue to follow low-sodium diet and adequate hydration -Patient instructed to check weight on daily basis -Continue follow-up with cardiology service as an outpatient.  Consultants: GI service curbside. Procedures performed: See below for x-ray reports. Disposition: Home Diet recommendation: Heart healthy/modified carbohydrate diet.  DISCHARGE MEDICATION: Allergies as of 07/03/2023       Reactions   Irbesartan Shortness Of Breath, Other (See Comments)   Prednisone Anaphylaxis, Swelling, Other (See Comments)   Swelling of the lips   Ramipril Shortness Of Breath, Other (See Comments)   Shellfish Allergy Anaphylaxis   Shrimp, shellfish   Tradjenta [linagliptin] Shortness Of Breath   Cephalexin Hives, Swelling   Clindamycin Hcl Hives, Swelling   Codeine Nausea And Vomiting, Other (See Comments)   Affects heart   Contrast Media [iodinated Contrast Media]    Can be administered if Benadryl is administered prior to prevent reaction    Crab Extract Other (See Comments)   Fenofibrate Other (See Comments)   "My skin started peeling off"   Glycotrol  [support-500] Other (See Comments)   unknown   Indomethacin Other (See Comments)   Unknown reaction   Minocycline Hcl Hives, Swelling   Morphine And Codeine Nausea And Vomiting   Projectile vomiting   Shrimp Extract Other (See Comments)   Sulfa Drugs Cross Reactors Hives, Swelling   Tetracyclines & Related    Tree Extract Itching        Medication List     STOP taking these medications    levofloxacin 750 MG tablet Commonly known as: LEVAQUIN   valsartan-hydrochlorothiazide 160-12.5 MG tablet Commonly known as: DIOVAN-HCT       TAKE these medications    aspirin 81 MG tablet Take 81 mg by mouth daily.   b complex vitamins capsule Take 1 capsule by mouth daily.   clopidogrel 75 MG tablet Commonly known as: PLAVIX TAKE ONE TABLET BY MOUTH ONCE DAILY.   Co Q 10 10 MG Caps Take 1 capsule by mouth daily.   diphenhydrAMINE 25 MG tablet Commonly known as: BENADRYL Take 25 mg by mouth every 6 (six) hours as needed.   ezetimibe 10 MG tablet Commonly known as: ZETIA Take 1 tablet (10 mg total) by mouth daily.   FISH OIL PO Take 1 capsule by mouth daily.   fluticasone 50 MCG/ACT nasal spray Commonly known as: FLONASE Place 2 sprays into both nostrils daily.   furosemide 40 MG tablet Commonly known as: LASIX Take 1 tablet (40 mg total) by mouth daily. Start taking on: July 05, 2023 What changed: These instructions start on July 05, 2023. If you are unsure what to do until then, ask your doctor or other care provider.   Januvia 100 MG tablet Generic drug: sitaGLIPtin Take 100 mg by  mouth daily.   levothyroxine 50 MCG tablet Commonly known as: SYNTHROID Take 50 mcg by mouth daily before breakfast.   metoprolol succinate 100 MG 24 hr tablet Commonly known as: TOPROL-XL Take 100 mg by mouth daily.   niacin 1000 MG CR tablet Commonly known as: NIASPAN TAKE 1 TABLET BY MOUTH AT BEDTIME.   OVER THE COUNTER MEDICATION INFLAMMATION VITAMIN   pravastatin  40 MG tablet Commonly known as: PRAVACHOL Take 1 tablet (40 mg total) by mouth every evening.   tamsulosin 0.4 MG Caps capsule Commonly known as: FLOMAX Take 0.4 mg by mouth daily.   tirzepatide 5 MG/0.5ML Pen Commonly known as: MOUNJARO Inject 5 mg into the skin every Monday.   valsartan 40 MG tablet Commonly known as: Diovan Take 1 tablet (40 mg total) by mouth daily. Start taking on: July 06, 2023   vitamin C 1000 MG tablet Take 1,000 mg by mouth daily.   Vitamin D3 25 MCG Caps Take 1 capsule by mouth daily.        Follow-up Information     Assunta Found, MD. Schedule an appointment as soon as possible for a visit in 10 day(s).   Specialty: Family Medicine Contact information: [address] [place] [address] [phone]                Discharge Exam: Ceasar Mons Weights   06/30/23 1144 06/30/23 2229  Weight: 101.6 kg 101.9 kg   General exam: Alert, awake, oriented x 3; in no acute distress and hemodynamically stable.  Tolerating diet and with good saturation on room air. Respiratory system: No wheezing, no crackles, no using accessory muscle. Cardiovascular system:RRR. No rubs or gallops. Gastrointestinal system: Abdomen is obese, nondistended, soft and nontender. No organomegaly or masses felt. Normal bowel sounds heard. Central nervous system: No focal neurological deficits. Extremities: No cyanosis or clubbing; trace edema appreciated bilaterally. Skin: No petechiae. Psychiatry: Judgement and insight appear normal. Mood & affect appropriate.    Condition at discharge: good  The results of significant diagnostics from this hospitalization (including imaging, microbiology, ancillary and laboratory) are listed below for reference.   Imaging Studies: No results found.  Microbiology: Results for orders placed or performed during the hospital encounter of 06/30/23  C Difficile Quick Screen w PCR reflex     Status: None   Collection Time:  06/30/23  5:39 PM   Specimen: STOOL  Result Value Ref Range Status   C Diff antigen NEGATIVE NEGATIVE Final   C Diff toxin NEGATIVE NEGATIVE Final   C Diff interpretation No C. difficile detected.  Final    Comment: Performed at [facility], [address] [place], [place] [address]  Gastrointestinal Panel by PCR , Stool     Status: Abnormal   Collection Time: 06/30/23  5:39 PM   Specimen: STOOL  Result Value Ref Range Status   Campylobacter species NOT DETECTED NOT DETECTED Final   Plesimonas shigelloides NOT DETECTED NOT DETECTED Final   Salmonella species NOT DETECTED NOT DETECTED Final   Yersinia enterocolitica NOT DETECTED NOT DETECTED Final   Vibrio species NOT DETECTED NOT DETECTED Final   Vibrio cholerae NOT DETECTED NOT DETECTED Final   Enteroaggregative E coli (EAEC) NOT DETECTED NOT DETECTED Final   Enteropathogenic E coli (EPEC) NOT DETECTED NOT DETECTED Final   Enterotoxigenic E coli (ETEC) NOT DETECTED NOT DETECTED Final   Shiga like toxin producing E coli (STEC) NOT DETECTED NOT DETECTED Final   Shigella/Enteroinvasive E coli (EIEC) NOT DETECTED NOT DETECTED  Final   Cryptosporidium NOT DETECTED NOT DETECTED Final   Cyclospora cayetanensis NOT DETECTED NOT DETECTED Final   Entamoeba histolytica NOT DETECTED NOT DETECTED Final   Giardia lamblia NOT DETECTED NOT DETECTED Final   Adenovirus F40/41 NOT DETECTED NOT DETECTED Final   Astrovirus NOT DETECTED NOT DETECTED Final   Norovirus GI/GII DETECTED (A) NOT DETECTED Final    Comment: RESULT CALLED TO, READ BACK BY AND VERIFIED WITH: MARIANNE HOWERTON 07/01/23 1104 MW    Rotavirus A NOT DETECTED NOT DETECTED Final   Sapovirus (I, II, IV, and V) NOT DETECTED NOT DETECTED Final    Comment: Performed at [facility], [address] [place], [place] [address]    Labs: CBC: Recent Labs  Lab 06/30/23 1325  WBC 5.2  NEUTROABS 2.5  HGB 11.6*  HCT 34.5*  MCV 96.6  PLT 126*   Basic Metabolic Panel: Recent  Labs  Lab 06/30/23 1325 07/01/23 0352 07/02/23 0839 07/03/23 0458  NA 133* 137 139 140  K 3.8 3.4* 4.2 3.9  CL 98 104 108 115*  CO2 23 22 23  21*  GLUCOSE 140* 91 115* 94  BUN 66* 64* 47* 34*  CREATININE 2.73* 2.13* 1.72* 1.53*  CALCIUM 9.9 9.4 9.5 9.2  PHOS  --  3.2  --   --    Liver Function Tests: Recent Labs  Lab 06/30/23 1325 07/01/23 0352 07/02/23 0839 07/03/23 0458  AST 16  --  13* 12*  ALT 12  --  10 9  ALKPHOS 23*  --  20* 18*  BILITOT 1.0  --  0.8 0.9  PROT 7.7  --  6.4* 6.2*  ALBUMIN 4.6 3.9 3.8 3.7   CBG: Recent Labs  Lab 07/02/23 1114 07/02/23 1615 07/02/23 2011 07/03/23 0747 07/03/23 1147  GLUCAP 142* 102* 132* 103* 145*    Discharge time spent: greater than 30 minutes.  Signed: Vassie Loll, MD [facility] 07/03/2023

## 2023-07-03 NOTE — Progress Notes (Signed)
 Patient states that he feels like he is " coming down with something " and feels " unwell in his lungs. He is requesting to be swabbed prior to discharge.

## 2023-07-09 DIAGNOSIS — Z6837 Body mass index (BMI) 37.0-37.9, adult: Secondary | ICD-10-CM | POA: Diagnosis not present

## 2023-07-09 DIAGNOSIS — N289 Disorder of kidney and ureter, unspecified: Secondary | ICD-10-CM | POA: Diagnosis not present

## 2023-07-09 DIAGNOSIS — K5150 Left sided colitis without complications: Secondary | ICD-10-CM | POA: Diagnosis not present

## 2023-07-09 DIAGNOSIS — A0811 Acute gastroenteropathy due to Norwalk agent: Secondary | ICD-10-CM | POA: Diagnosis not present

## 2023-07-09 DIAGNOSIS — E6609 Other obesity due to excess calories: Secondary | ICD-10-CM | POA: Diagnosis not present

## 2023-08-03 DIAGNOSIS — E782 Mixed hyperlipidemia: Secondary | ICD-10-CM | POA: Diagnosis not present

## 2023-08-03 DIAGNOSIS — I10 Essential (primary) hypertension: Secondary | ICD-10-CM | POA: Diagnosis not present

## 2023-08-03 DIAGNOSIS — E1159 Type 2 diabetes mellitus with other circulatory complications: Secondary | ICD-10-CM | POA: Diagnosis not present

## 2023-08-04 DIAGNOSIS — C61 Malignant neoplasm of prostate: Secondary | ICD-10-CM

## 2023-08-04 HISTORY — DX: Malignant neoplasm of prostate: C61

## 2023-08-06 DIAGNOSIS — I10 Essential (primary) hypertension: Secondary | ICD-10-CM | POA: Diagnosis not present

## 2023-08-06 DIAGNOSIS — E782 Mixed hyperlipidemia: Secondary | ICD-10-CM | POA: Diagnosis not present

## 2023-08-06 DIAGNOSIS — E6609 Other obesity due to excess calories: Secondary | ICD-10-CM | POA: Diagnosis not present

## 2023-08-06 DIAGNOSIS — N183 Chronic kidney disease, stage 3 unspecified: Secondary | ICD-10-CM | POA: Diagnosis not present

## 2023-08-06 DIAGNOSIS — E1129 Type 2 diabetes mellitus with other diabetic kidney complication: Secondary | ICD-10-CM | POA: Diagnosis not present

## 2023-08-06 DIAGNOSIS — Z6834 Body mass index (BMI) 34.0-34.9, adult: Secondary | ICD-10-CM | POA: Diagnosis not present

## 2023-08-06 DIAGNOSIS — E119 Type 2 diabetes mellitus without complications: Secondary | ICD-10-CM | POA: Diagnosis not present

## 2023-08-06 DIAGNOSIS — E039 Hypothyroidism, unspecified: Secondary | ICD-10-CM | POA: Diagnosis not present

## 2023-08-06 DIAGNOSIS — I2510 Atherosclerotic heart disease of native coronary artery without angina pectoris: Secondary | ICD-10-CM | POA: Diagnosis not present

## 2023-08-07 DIAGNOSIS — C61 Malignant neoplasm of prostate: Secondary | ICD-10-CM | POA: Diagnosis not present

## 2023-08-07 DIAGNOSIS — N4289 Other specified disorders of prostate: Secondary | ICD-10-CM | POA: Diagnosis not present

## 2023-08-13 DIAGNOSIS — H16213 Exposure keratoconjunctivitis, bilateral: Secondary | ICD-10-CM | POA: Diagnosis not present

## 2023-08-13 DIAGNOSIS — H02135 Senile ectropion of left lower eyelid: Secondary | ICD-10-CM | POA: Diagnosis not present

## 2023-08-13 DIAGNOSIS — H02132 Senile ectropion of right lower eyelid: Secondary | ICD-10-CM | POA: Diagnosis not present

## 2023-08-13 DIAGNOSIS — H04123 Dry eye syndrome of bilateral lacrimal glands: Secondary | ICD-10-CM | POA: Diagnosis not present

## 2023-08-17 DIAGNOSIS — R3912 Poor urinary stream: Secondary | ICD-10-CM | POA: Diagnosis not present

## 2023-08-17 DIAGNOSIS — C61 Malignant neoplasm of prostate: Secondary | ICD-10-CM | POA: Diagnosis not present

## 2023-08-17 DIAGNOSIS — N401 Enlarged prostate with lower urinary tract symptoms: Secondary | ICD-10-CM | POA: Diagnosis not present

## 2023-08-21 ENCOUNTER — Other Ambulatory Visit (HOSPITAL_COMMUNITY): Payer: Self-pay | Admitting: Urology

## 2023-08-21 DIAGNOSIS — C61 Malignant neoplasm of prostate: Secondary | ICD-10-CM

## 2023-09-02 DIAGNOSIS — E039 Hypothyroidism, unspecified: Secondary | ICD-10-CM | POA: Diagnosis not present

## 2023-09-02 DIAGNOSIS — E782 Mixed hyperlipidemia: Secondary | ICD-10-CM | POA: Diagnosis not present

## 2023-09-02 DIAGNOSIS — E1159 Type 2 diabetes mellitus with other circulatory complications: Secondary | ICD-10-CM | POA: Diagnosis not present

## 2023-09-03 ENCOUNTER — Encounter (HOSPITAL_COMMUNITY)
Admission: RE | Admit: 2023-09-03 | Discharge: 2023-09-03 | Disposition: A | Discharge status: Home / Self Care | Source: Ambulatory Visit | Attending: Urology | Admitting: Urology

## 2023-09-03 DIAGNOSIS — C61 Malignant neoplasm of prostate: Secondary | ICD-10-CM | POA: Insufficient documentation

## 2023-09-03 MED ORDER — FLOTUFOLASTAT F 18 GALLIUM 296-5846 MBQ/ML IV SOLN
8.7470 | Freq: Once | INTRAVENOUS | Status: AC
  Administered 2023-09-03: 8.747 via INTRAVENOUS

## 2023-09-07 ENCOUNTER — Telehealth: Payer: Self-pay | Admitting: Cardiovascular Disease

## 2023-09-07 DIAGNOSIS — R3912 Poor urinary stream: Secondary | ICD-10-CM | POA: Diagnosis not present

## 2023-09-07 DIAGNOSIS — C61 Malignant neoplasm of prostate: Secondary | ICD-10-CM | POA: Diagnosis not present

## 2023-09-07 DIAGNOSIS — N401 Enlarged prostate with lower urinary tract symptoms: Secondary | ICD-10-CM | POA: Diagnosis not present

## 2023-09-07 NOTE — Telephone Encounter (Signed)
 Pt called in stating he is undergoing radiation and the office is not concerned about his blood thinner, however he is a little concerned about "internal bleeding" and asked if it would be safe for him to hold his blood thinner for a month while he undergoing radiation.

## 2023-09-07 NOTE — Telephone Encounter (Signed)
 Left a message to call back.

## 2023-09-08 NOTE — Telephone Encounter (Signed)
 Called and spoke w the patient.  Adv of reply from Dr. Katheryne Pane.  He then asked about holding aspirin .  Radiation is not requesting he hold it.  I adv to continue aspirin  as ordered.  Pt has CAD. ______________________________________________________  Avanell Leigh, MD  Christine Cozier, RN16 hours ago (4:06 PM)   Not sure that there is anything to worry about but if he wants to hold his clopidogrel  that is fine with me.

## 2023-09-08 NOTE — Telephone Encounter (Signed)
Follow Up: ° ° ° ° ° °Patient is returning call from yesterday °

## 2023-09-11 ENCOUNTER — Encounter: Payer: Self-pay | Admitting: Radiation Oncology

## 2023-09-11 NOTE — Progress Notes (Signed)
 GU Location of Tumor / Histology:  Prostate Ca  If Prostate Cancer, Gleason Score is (4 + 4) and PSA is (15.2 on 06/05/2023)  Kevin [name] presented as referral from Dr. Aimee Alf [facility]) elevated PSA.  Biopsies     09/03/2023 Dr. Aimee Alf NM PET (PSMA) Skull to Mid Thigh CLINICAL DATA:  Prostate carcinoma with biochemical recurrence.   IMPRESSION: 1. Moderate radiotracer activity along the posterior RIGHT margin of the prostate gland is consistent with prostate adenocarcinoma. Recommend correlation with biopsy. 2. Single enlarged mildly radiotracer avid radiotracer avid lymph node along the LEFT external iliac chain is concerning for nodal metastasis. However, node was similar size on comparison CT 2021.  Indeterminate finding 3. No evidence of visceral metastasis or skeletal metastasis. 4.  Aortic Atherosclerosis (ICD10-I70.0).    Past/Anticipated interventions by urology, if any:  Dr. Aimee Alf   Past/Anticipated interventions by medical oncology, if any:  NA  Weight changes, if any: {:18581}  IPSS: SHIM:  Bowel/Bladder complaints, if any: {:18581}   Nausea/Vomiting, if any: {:18581}  Pain issues, if any:  {:18581}  SAFETY ISSUES: Prior radiation? {:18581} Pacemaker/ICD? {:18581} Possible current pregnancy? Male Is the patient on methotrexate? No  Current Complaints / other details:

## 2023-09-14 NOTE — Progress Notes (Signed)
 Radiation Oncology         [phone] ________________________________  Initial Outpatient Consultation  Name: Kevin [name] MRN: [id number]  Date: 09/15/2023  DOB: 04-16-1946  NU:UVOZDGU, Autry Legions, MD  Cathi Cluster Emilio Harder, MD   REFERRING PHYSICIAN: Thelbert Finner, MD  DIAGNOSIS: 78 y.o. gentleman with Stage T1c adenocarcinoma of the prostate with Gleason score of 4+3, and PSA of 15.2.  No diagnosis found.  HISTORY OF PRESENT ILLNESS: Kevin [name] is a 78 y.o. male with a diagnosis of prostate cancer. He was initially referred to Dr. Annitta Kindler at [facility] back in 2022 for an elevated PSA of 10.5 in 03/2020. He was found to have an E coli UTI and was treated with antibiotics and tamsulosin . His PSA dropped to 6.6 in 08/2020. The patient opted to proceed with PSA surveillance. His PSA fluctuated between 5 and 7 for the next two years but jumped to 10.2 in 02/2023. Given concerns for metal implant for IPP placed in 1990's, MRI was not performed. Of note, Dr. Cathi Cluster took over care of the patient starting in 05/2023. A repeat PSA obtained in 06/2023 showed a further rise to 15.2. The patient proceeded to transrectal ultrasound with 12 biopsies of the prostate on 08/07/23.  The prostate volume measured 84 cc.  Out of 12 core biopsies, 8 were positive.  The maximum Gleason score was 4+3, and this was seen in right mid lateral. Additionally, Gleason 3+4 was seen in right base lateral, right mid (with perineural invasion), right base (with PNI), and left base lateral, and Gleason 3+3 in left mid lateral, left base, and right apex.  He underwent staging PSMA PET scan on 09/03/23 showing an enlarged mildly radiotracer-avid lymph node along left external iliac chain. This node was noted to be of a similar size on a comparison CT from 2021 and is considered indeterminate. There is no other evidence of abnormal activity outside of the prostate.  The patient reviewed the biopsy results with his urologist  and he has kindly been referred today for discussion of potential radiation treatment options.   PREVIOUS RADIATION THERAPY: No  PAST MEDICAL HISTORY:  Past Medical History:  Diagnosis Date   Angio-edema    Arthritis    Asthma    as a child. no problems now.    BPH with elevated PSA and lower urinary tract symptoms    CAD (coronary artery disease)    Cellulitis 09/2009   left leg   Diabetes mellitus    Type 2   Diverticulosis    Diverticulosis    Elevated PSA    HTN (hypertension)    Hyperlipidemia    Incomplete emptying of bladder    Melanoma (HCC) 02/2010   left foot   Melanoma of foot (HCC) 06/02/2011   Microscopic colitis 10/09/2010   Colonoscopy dr Riley Cheadle   Obesity    OSA on CPAP 01/20/2014   Psoriasis    Pyuria 2022   S/P colonoscopy 07/03/2003    Dr [name]->hyperplastic rectal polyp   S/P endoscopy 10/09/2010   lap band intact, duodenal erosions   Seasonal allergies    Skin cancer    Sleep apnea    Urticaria    Weak urine stream       PAST SURGICAL HISTORY: Past Surgical History:  Procedure Laterality Date   ABDOMINAL SURGERY     ADENOIDECTOMY     APPENDECTOMY     CARDIAC CATHETERIZATION N/A 02/14/2015   Procedure: Left Heart Cath and Coronary Angiography;  Surgeon: Lucendia Rusk, MD;  Location: [facility] INVASIVE CV LAB;  Service: Cardiovascular;  Laterality: N/A;   CARDIAC CATHETERIZATION N/A 02/14/2015   Procedure: Coronary Stent Intervention;  Surgeon: Lucendia Rusk, MD;  [facility] INVASIVE CV LAB;  Service: Cardiovascular;  Laterality: N/A;   COLONOSCOPY N/A 10/09/2010   normal rectum, long redundant colon, pancolonic diverticula, 6mm cecal polyp, diminutive polyp at the appendiceal orifice ablated with tip of hot snare (Biopsies suggestive of microscopic colitis; polyp benign; small bowel ok)   CORONARY ANGIOPLASTY  2002/2005/2008   CORONARY ANGIOPLASTY WITH STENT PLACEMENT  06/11/2007   PTCA & stenting distal CX, PTCA & balloon angioplasty  in-stent restenotic mid CX coronary artery stent   ESOPHAGOGASTRODUODENOSCOPY N/A 10/09/2010   Normal esophagus, lap band present, 2nd portion duodenum with erosions without frank ulcer   GASTRIC BANDING PORT REVISION N/A 09/20/2021   Procedure: GASTRIC BAND REMOVAL;  Surgeon: Awilda Bogus, MD;  Location: [facility];  Service: General;  Laterality: N/A;   KNEE SURGERY     left   LAPAROTOMY N/A 09/20/2021   Procedure: EXPLORATORY LAPAROTOMY,  LYSIS OF ADHESIONS, REDUCTION OF VOLVULUS;  Surgeon: Awilda Bogus, MD;  Location: [facility];  Service: General;  Laterality: N/A;   LEFT HEART CATHETERIZATION WITH CORONARY ANGIOGRAM N/A 07/15/2013   Procedure: LEFT HEART CATHETERIZATION WITH CORONARY ANGIOGRAM;  Surgeon: Mickiel Albany, MD;  Location: [facility] CATH LAB;  Service: Cardiovascular;  Laterality: N/A;   NM MYOCAR PERF WALL MOTION  06/24/2011   small mild fixed anteroseptal & anteroapical defects.   PENILE PROSTHESIS IMPLANT     PROSTATE BIOPSY     SHOULDER SURGERY     left   SINOSCOPY     TONSILLECTOMY     TYMPANOSTOMY TUBE PLACEMENT      FAMILY HISTORY:  Family History  Problem Relation Age of Onset   Coronary artery disease Mother    Diabetes Father    Liver cancer Brother 90   Celiac disease Other 43       brother's daughter   Allergic rhinitis Neg Hx    Angioedema Neg Hx    Asthma Neg Hx    Atopy Neg Hx    Eczema Neg Hx    Immunodeficiency Neg Hx    Urticaria Neg Hx     SOCIAL HISTORY:  Social History   Socioeconomic History   Marital status: Divorced    Spouse name: Not on file   Number of children: 1   Years of education: 12   Highest education level: 12th grade  Occupational History   Occupation: Manufacturing engineer: [employer]    Comment: [place]  Tobacco Use   Smoking status: Former    Current packs/day: 0.00    Average packs/day: 3.0 packs/day for 30.0 years (89.9 ttl pk-yrs)    Types: Cigarettes    Start date: 11/18/1964    Quit date:  10/04/1994    Years since quitting: 28.9    Passive exposure: Past   Smokeless tobacco: Never   Tobacco comments:    quit smoking 19 years ago  Vaping Use   Vaping status: Never Used  Substance and Sexual Activity   Alcohol use: Not Currently    Comment: 3-4 times/year   Drug use: No   Sexual activity: Not Currently    Partners: Female  Other Topics Concern   Not on file  Social History Narrative   Not on file   Social Drivers of  Health   Financial Resource Strain: Low Risk  (08/05/2022)   Overall Financial Resource Strain (CARDIA)    Difficulty of Paying Living Expenses: Not hard at all  Food Insecurity: No Food Insecurity (06/30/2023)   Hunger Vital Sign    Worried About Running Out of Food in the Last Year: Never true    Ran Out of Food in the Last Year: Never true  Transportation Needs: No Transportation Needs (06/30/2023)   PRAPARE - Administrator, Civil Service (Medical): No    Lack of Transportation (Non-Medical): No  Physical Activity: Inactive (08/05/2022)   Exercise Vital Sign    Days of Exercise per Week: 0 days    Minutes of Exercise per Session: 0 min  Stress: No Stress Concern Present (08/05/2022)   [employer] of Occupational Health - Occupational Stress Questionnaire    Feeling of Stress : Only a little  Social Connections: Patient Declined (06/30/2023)   Social Connection and Isolation Panel [NHANES]    Frequency of Communication with Friends and Family: Patient declined    Frequency of Social Gatherings with Friends and Family: Patient declined    Attends Religious Services: Patient declined    Database administrator or Organizations: Patient declined    Attends Banker Meetings: Patient declined    Marital Status: Patient declined  Intimate Partner Violence: Not At Risk (06/30/2023)   Humiliation, Afraid, Rape, and Kick questionnaire    Fear of Current or Ex-Partner: No    Emotionally Abused: No    Physically Abused: No     Sexually Abused: No    ALLERGIES: Irbesartan, Prednisone , Ramipril, Shellfish allergy , Tradjenta [linagliptin], Cephalexin, Clindamycin hcl, Codeine, Contrast media [iodinated contrast media], Crab extract, Fenofibrate, Glycotrol [support-500], Indomethacin, Minocycline hcl, Morphine and codeine, Shrimp extract, Sulfa drugs cross reactors, Tetracyclines & related, and Tree extract  MEDICATIONS:  Current Outpatient Medications  Medication Sig Dispense Refill   Ascorbic Acid (VITAMIN C) 1000 MG tablet Take 1,000 mg by mouth daily.     aspirin  81 MG tablet Take 81 mg by mouth daily.     b complex vitamins capsule Take 1 capsule by mouth daily.     Cholecalciferol (VITAMIN D3) 25 MCG CAPS Take 1 capsule by mouth daily.     clopidogrel  (PLAVIX ) 75 MG tablet TAKE ONE TABLET BY MOUTH ONCE DAILY. 90 tablet 2   Coenzyme Q10 (CO Q 10) 10 MG CAPS Take 1 capsule by mouth daily.     diphenhydrAMINE  (BENADRYL ) 25 MG tablet Take 25 mg by mouth every 6 (six) hours as needed.     ezetimibe  (ZETIA ) 10 MG tablet Take 1 tablet (10 mg total) by mouth daily. (Patient not taking: Reported on 07/01/2023) 90 tablet 3   fluticasone  (FLONASE ) 50 MCG/ACT nasal spray Place 2 sprays into both nostrils daily.     furosemide  (LASIX ) 40 MG tablet Take 1 tablet (40 mg total) by mouth daily.     JANUVIA 100 MG tablet Take 100 mg by mouth daily.     levothyroxine  (SYNTHROID ) 50 MCG tablet Take 50 mcg by mouth daily before breakfast.     metoprolol  (TOPROL -XL) 100 MG 24 hr tablet Take 100 mg by mouth daily.     niacin  (NIASPAN ) 1000 MG CR tablet TAKE 1 TABLET BY MOUTH AT BEDTIME. 90 tablet 3   Omega-3 Fatty Acids (FISH OIL PO) Take 1 capsule by mouth daily.     OVER THE COUNTER MEDICATION INFLAMMATION VITAMIN     pravastatin  (PRAVACHOL )  40 MG tablet Take 1 tablet (40 mg total) by mouth every evening. 90 tablet 3   tamsulosin  (FLOMAX ) 0.4 MG CAPS capsule Take 0.4 mg by mouth daily.     No current facility-administered  medications for this encounter.    REVIEW OF SYSTEMS:  On review of systems, the patient reports that he is doing well overall. He denies any chest pain, shortness of breath, cough, fevers, chills, night sweats, unintended weight changes. He denies any bowel disturbances, and denies abdominal pain, nausea or vomiting. He denies any new musculoskeletal or joint aches or pains. His IPSS was ***, indicating *** urinary symptoms. His SHIM was ***, indicating he {does not have/has mild/moderate/severe} erectile dysfunction. A complete review of systems is obtained and is otherwise negative.    PHYSICAL EXAM:  Wt Readings from Last 3 Encounters:  06/30/23 224 lb 10.4 oz (101.9 kg)  06/24/23 224 lb (101.6 kg)  06/15/23 225 lb 9.6 oz (102.3 kg)   Temp Readings from Last 3 Encounters:  07/03/23 97.6 F (36.4 C) (Oral)  06/15/23 (!) 97.5 F (36.4 C) (Oral)  03/05/23 98.1 F (36.7 C) (Oral)   BP Readings from Last 3 Encounters:  07/03/23 (!) 142/71  06/24/23 114/60  06/15/23 (!) 146/85   Pulse Readings from Last 3 Encounters:  07/03/23 79  06/24/23 74  06/15/23 71    /10  In general this is a well appearing *** male in no acute distress. He's alert and oriented x4 and appropriate throughout the examination. Cardiopulmonary assessment is negative for acute distress, and he exhibits normal effort.     KPS = ***  100 - Normal; no complaints; no evidence of disease. 90   - Able to carry on normal activity; minor signs or symptoms of disease. 80   - Normal activity with effort; some signs or symptoms of disease. 73   - Cares for self; unable to carry on normal activity or to do active work. 60   - Requires occasional assistance, but is able to care for most of his personal needs. 50   - Requires considerable assistance and frequent medical care. 40   - Disabled; requires special care and assistance. 30   - Severely disabled; hospital admission is indicated although death not imminent. 20    - Very sick; hospital admission necessary; active supportive treatment necessary. 10   - Moribund; fatal processes progressing rapidly. 0     - Dead  Karnofsky DA, Abelmann WH, Craver LS and Burchenal [facility] [phone]) The use of the nitrogen mustards in the palliative treatment of carcinoma: with particular reference to bronchogenic carcinoma Cancer 1 634-56  LABORATORY DATA:  Lab Results  Component Value Date   WBC 5.2 06/30/2023   HGB 11.6 (L) 06/30/2023   HCT 34.5 (L) 06/30/2023   MCV 96.6 06/30/2023   PLT 126 (L) 06/30/2023   Lab Results  Component Value Date   NA 140 07/03/2023   K 3.9 07/03/2023   CL 115 (H) 07/03/2023   CO2 21 (L) 07/03/2023   Lab Results  Component Value Date   ALT 9 07/03/2023   AST 12 (L) 07/03/2023   ALKPHOS 18 (L) 07/03/2023   BILITOT 0.9 07/03/2023     RADIOGRAPHY: NM PET (PSMA) SKULL TO MID THIGH Result Date: 09/03/2023 CLINICAL DATA:  Prostate carcinoma with biochemical recurrence. EXAM: NUCLEAR MEDICINE PET SKULL BASE TO THIGH TECHNIQUE: 8.5 mCi Flotufolastat (Posluma ) was injected intravenously. Full-ring PET imaging was performed from the skull base to thigh after  the radiotracer. CT data was obtained and used for attenuation correction and anatomic localization. COMPARISON:  CT 03/05/2020 FINDINGS: NECK No radiotracer activity in neck lymph nodes. Incidental CT finding: None. CHEST No radiotracer accumulation within mediastinal or hilar lymph nodes. No suspicious pulmonary nodules on the CT scan. Incidental CT finding: None. ABDOMEN/PELVIS Prostate: Rim of moderate radiotracer activity along the posterior RIGHT margin of the prostate gland SUV max equal 5.3 on image 191. Lymph nodes: Single enlarged radiotracer avid lymph node along the LEFT external iliac chain measuring 14 mm on image 187 with mild activity, SUV max equal 4.4. this node is similar size on CT 03/05/2020. Liver: No evidence of liver metastasis. Incidental CT finding: Atherosclerotic  calcification of the aorta. SKELETON No focal activity to suggest skeletal metastasis. IMPRESSION: 1. Moderate radiotracer activity along the posterior RIGHT margin of the prostate gland is consistent with prostate adenocarcinoma. Recommend correlation with biopsy. 2. Single enlarged mildly radiotracer avid radiotracer avid lymph node along the LEFT external iliac chain is concerning for nodal metastasis. However, node was similar size on comparison CT 2021. Indeterminate finding 3. No evidence of visceral metastasis or skeletal metastasis. 4.  Aortic Atherosclerosis (ICD10-I70.0). Electronically Signed   By: Deboraha Fallow M.D.   On: 09/03/2023 17:07      IMPRESSION/PLAN: 1. 78 y.o. gentleman with Stage T1c adenocarcinoma of the prostate with Gleason Score of 4+3, and PSA of 15.2. We discussed the patient's workup and outlined the nature of prostate cancer in this setting. The patient's T stage, Gleason's score, and PSA put him into the intermediate risk group. Accordingly, he is eligible for a variety of potential treatment options including brachytherapy, 5.5 weeks of external radiation, or prostatectomy. We discussed the available radiation techniques, and focused on the details and logistics of delivery. The patient may not be an ideal candidate for brachytherapy/boost with a prostate volume of 84 cc{ prior to downsizing from hormone therapy. We discussed that based on his prostate volume, he would require beginning treatment with a 5 alpha reductase inhibitor +/- ADT for at least 3 months to allow for downsizing of the prostate prior to initiating brachytherapy}. We discussed and outlined the risks, benefits, short and long-term effects associated with radiotherapy and compared and contrasted these with prostatectomy. We discussed the role of SpaceOAR gel in reducing the rectal toxicity associated with radiotherapy. He appears to have a good understanding of his disease and our treatment recommendations  which are of curative intent.  He was encouraged to ask questions that were answered to his stated satisfaction.  At the conclusion of our conversation, the patient is interested in moving forward with ***.  We personally spent *** minutes in this encounter including chart review, reviewing radiological studies, meeting face-to-face with the patient, entering orders and completing documentation.    Arta Bihari, PA-C    Kenith Payer, MD  [facility]  Radiation Oncology Direct Dial: [phone]  Fax: [phone] [email or web address]  Skype  [email or web address]   This document serves as a record of services personally performed by Kenith Payer, MD and Keitha Pata, PA-C. It was created on their behalf by Florance Hun, a trained medical scribe. The creation of this record is based on the scribe's personal observations and the provider's statements to them. This document has been checked and approved by the attending provider.

## 2023-09-15 ENCOUNTER — Encounter: Payer: Self-pay | Admitting: Urology

## 2023-09-15 ENCOUNTER — Ambulatory Visit
Admission: RE | Admit: 2023-09-15 | Discharge: 2023-09-15 | Disposition: A | Discharge status: Home / Self Care | Source: Ambulatory Visit | Attending: Radiation Oncology | Admitting: Radiation Oncology

## 2023-09-15 VITALS — BP 147/78 | HR 61 | Temp 97.9°F | Resp 20 | Ht 69.0 in | Wt 226.2 lb

## 2023-09-15 DIAGNOSIS — Z7984 Long term (current) use of oral hypoglycemic drugs: Secondary | ICD-10-CM | POA: Insufficient documentation

## 2023-09-15 DIAGNOSIS — C61 Malignant neoplasm of prostate: Secondary | ICD-10-CM

## 2023-09-15 DIAGNOSIS — Z87891 Personal history of nicotine dependence: Secondary | ICD-10-CM | POA: Diagnosis not present

## 2023-09-15 DIAGNOSIS — E119 Type 2 diabetes mellitus without complications: Secondary | ICD-10-CM | POA: Diagnosis not present

## 2023-09-15 DIAGNOSIS — Z7902 Long term (current) use of antithrombotics/antiplatelets: Secondary | ICD-10-CM | POA: Diagnosis not present

## 2023-09-15 DIAGNOSIS — I10 Essential (primary) hypertension: Secondary | ICD-10-CM | POA: Diagnosis not present

## 2023-09-15 DIAGNOSIS — Z79899 Other long term (current) drug therapy: Secondary | ICD-10-CM | POA: Insufficient documentation

## 2023-09-15 DIAGNOSIS — I700 Atherosclerosis of aorta: Secondary | ICD-10-CM

## 2023-09-15 DIAGNOSIS — Z85828 Personal history of other malignant neoplasm of skin: Secondary | ICD-10-CM | POA: Insufficient documentation

## 2023-09-15 DIAGNOSIS — E785 Hyperlipidemia, unspecified: Secondary | ICD-10-CM | POA: Insufficient documentation

## 2023-09-15 DIAGNOSIS — Z191 Hormone sensitive malignancy status: Secondary | ICD-10-CM | POA: Diagnosis not present

## 2023-09-15 DIAGNOSIS — R599 Enlarged lymph nodes, unspecified: Secondary | ICD-10-CM | POA: Insufficient documentation

## 2023-09-15 DIAGNOSIS — Z85820 Personal history of malignant melanoma of skin: Secondary | ICD-10-CM | POA: Insufficient documentation

## 2023-09-15 DIAGNOSIS — Z79890 Hormone replacement therapy (postmenopausal): Secondary | ICD-10-CM | POA: Diagnosis not present

## 2023-09-15 DIAGNOSIS — Z7982 Long term (current) use of aspirin: Secondary | ICD-10-CM | POA: Diagnosis not present

## 2023-09-15 DIAGNOSIS — Z7985 Long-term (current) use of injectable non-insulin antidiabetic drugs: Secondary | ICD-10-CM | POA: Insufficient documentation

## 2023-09-15 DIAGNOSIS — Z800 Family history of malignant neoplasm of digestive organs: Secondary | ICD-10-CM | POA: Insufficient documentation

## 2023-09-15 HISTORY — DX: Retention of urine, unspecified: R33.9

## 2023-09-15 HISTORY — DX: Poor urinary stream: R39.12

## 2023-09-15 HISTORY — DX: Unspecified malignant neoplasm of skin, unspecified: C44.90

## 2023-09-15 HISTORY — DX: Elevated prostate specific antigen (PSA): R97.20

## 2023-09-15 HISTORY — DX: Unspecified osteoarthritis, unspecified site: M19.90

## 2023-09-15 HISTORY — DX: Atherosclerosis of aorta: I70.0

## 2023-09-15 NOTE — Progress Notes (Signed)
 Introduced myself to the patient as the prostate nurse navigator.  No barriers to care identified at this time.  He is here to discuss his radiation treatment options and will proceed with 5.5 weeks of daily radiation, no ADT due to cardiac risk.  I gave him my business card and asked him to call me with questions or concerns.  Verbalized understanding.

## 2023-09-16 ENCOUNTER — Other Ambulatory Visit: Payer: Self-pay | Admitting: Urology

## 2023-09-16 ENCOUNTER — Telehealth: Payer: Self-pay | Admitting: Cardiovascular Disease

## 2023-09-16 NOTE — Telephone Encounter (Signed)
   Pre-operative Risk Assessment    Patient Name: Kevin [name]  DOB: Feb 19, 1946 MRN: [id number]   Date of last office visit:  Date of next office visit:    Request for Surgical Clearance    Procedure:   Fiducial Markers  Date of Surgery:  01-15-24                                Surgeon:  Dr Cathi Cluster Surgeon's Group or Practice Name:   Phone number:  [phone] Fax number:  [phone]   Type of Clearance Requested:   - Medical - hold Plavix  for 5 days  medicine Type of Anesthesia:  General    Additional requests/questions:    [name]   09/16/2023, 2:03 PM

## 2023-09-17 DIAGNOSIS — Z191 Hormone sensitive malignancy status: Secondary | ICD-10-CM | POA: Diagnosis not present

## 2023-09-17 DIAGNOSIS — C61 Malignant neoplasm of prostate: Secondary | ICD-10-CM | POA: Diagnosis not present

## 2023-09-22 NOTE — Progress Notes (Signed)
 RN spoke with patient, patient has concerns with date of fiducial marker's and spaceOAR placement being on 9/12.    RN will collaborate with urologist to see about dates changes due to patient having unfavorable intermediate disease and will not being having ADT due to cardiac risk.  Patient aware, will continue to follow.

## 2023-09-29 NOTE — Telephone Encounter (Signed)
   Name: Kevin [name]  DOB: 1945/12/13  MRN: [id number]  Primary Cardiologist: Lauro Portal, MD   Preoperative team, please contact this patient and set up a phone call appointment for further preoperative risk assessment. Please obtain consent and complete medication review. Thank you for your help.  Please schedule televisit for patient in August to coincide with scheduled surgery date of 01/15/2024  I confirm that guidance regarding antiplatelet and oral anticoagulation therapy has been completed and, if necessary, noted below.  Per office protocol and pending no concerning cardiac symptoms, he may hold Plavix  for 5-7 days prior to procedure and should resume as soon as hemodynamically stable postoperatively. If necessary his aspirin  also may be held for 5 to 7 days prior to his procedure.   I also confirmed the patient resides in the state of [place] . As per [facility] Medical Board telemedicine laws, the patient must reside in the state in which the provider is licensed.   Francene Ing, Retha Cast, NP 09/29/2023, 8:22 AM [facility]

## 2023-09-29 NOTE — Telephone Encounter (Signed)
 Left message for pt to call our office and ask for the preop team to schedule TELE PREOP Appt.

## 2023-09-30 ENCOUNTER — Telehealth: Payer: Self-pay

## 2023-09-30 NOTE — Telephone Encounter (Signed)
 LVM asking pt to call office to schedule virtual visit for preop clearance. Please reach out to preop team if pt returns call. Thank you.

## 2023-10-01 NOTE — Telephone Encounter (Addendum)
 I left a message for the patient to call our office to schedule a tele visit for pre-op. This is the 3rd and final attempt to try and reach him so I will remove from the pre-op pool per our office protocol.

## 2023-10-05 ENCOUNTER — Telehealth: Payer: Self-pay | Admitting: Cardiovascular Disease

## 2023-10-05 NOTE — Telephone Encounter (Signed)
 Caller [name]) is reporting patient's surgery date has been moved up to 11/27/23.

## 2023-10-05 NOTE — Telephone Encounter (Signed)
 Rhonda, surgery scheduler called to update our office surgery date for the pt has been moved up to 11/27/23 fro  01/15/24. I left message for [name] as FYI that I did get the message surgery date moved up; also that we did attempt x 3 to reach the pt to schedule a tele preop appt when originally set for 01/15/24. I left message for [name] that we will reach back out to the pt.    I called and left a detailed message for the pt that he is going to need to schedule a tele preop appt.

## 2023-10-06 NOTE — Telephone Encounter (Signed)
 LVM asking pt to call our office to schedule virtual visit for preop clearance. Please reach out to preop team if pt returns call. Thank you.

## 2023-10-07 ENCOUNTER — Other Ambulatory Visit: Payer: Self-pay | Admitting: Urology

## 2023-10-07 ENCOUNTER — Telehealth: Payer: Self-pay | Admitting: *Deleted

## 2023-10-07 DIAGNOSIS — C61 Malignant neoplasm of prostate: Secondary | ICD-10-CM

## 2023-10-07 NOTE — Telephone Encounter (Signed)
 I called the surgery scheduler Leonie Rams that I see they moved up the pt's surgery date. However, our office has attempted to reach the pt x 3+ with no call back from the pt. Pt needs to be schedule a tele preop appt before he can be cleared. I did leave message if she s/w the pt to please tell him to call the cardiology office [phone] to schedule a tele visit for preop clearance.    I will fax these notes as FYI to [facility] as well. We are always happy to help and provide the best care for our pt's.

## 2023-10-07 NOTE — Telephone Encounter (Signed)
 I s/w the pt's niece who called back and she helped set up tele preop appt for the pt for 10/13/23. Med rec and consent are done.      Patient Consent for Virtual Visit        [name] has provided verbal consent on 10/07/2023 for a virtual visit (video or telephone).   CONSENT FOR VIRTUAL VISIT FOR:  Kevin [name]  By participating in this virtual visit I agree to the following:  I hereby voluntarily request, consent and authorize [facility] and its employed or contracted physicians, physician assistants, nurse practitioners or other licensed health care professionals (the Practitioner), to provide me with telemedicine health care services (the "Services") as deemed necessary by the treating Practitioner. I acknowledge and consent to receive the Services by the Practitioner via telemedicine. I understand that the telemedicine visit will involve communicating with the Practitioner through live audiovisual communication technology and the disclosure of certain medical information by electronic transmission. I acknowledge that I have been given the opportunity to request an in-person assessment or other available alternative prior to the telemedicine visit and am voluntarily participating in the telemedicine visit.  I understand that I have the right to withhold or withdraw my consent to the use of telemedicine in the course of my care at any time, without affecting my right to future care or treatment, and that the Practitioner or I may terminate the telemedicine visit at any time. I understand that I have the right to inspect all information obtained and/or recorded in the course of the telemedicine visit and may receive copies of available information for a reasonable fee.  I understand that some of the potential risks of receiving the Services via telemedicine include:  Delay or interruption in medical evaluation due to technological equipment failure or disruption; Information  transmitted may not be sufficient (e.g. poor resolution of images) to allow for appropriate medical decision making by the Practitioner; and/or  In rare instances, security protocols could fail, causing a breach of personal health information.  Furthermore, I acknowledge that it is my responsibility to provide information about my medical history, conditions and care that is complete and accurate to the best of my ability. I acknowledge that Practitioner's advice, recommendations, and/or decision may be based on factors not within their control, such as incomplete or inaccurate data provided by me or distortions of diagnostic images or specimens that may result from electronic transmissions. I understand that the practice of medicine is not an exact science and that Practitioner makes no warranties or guarantees regarding treatment outcomes. I acknowledge that a copy of this consent can be made available to me via my patient portal [facility] MyChart), or I can request a printed copy by calling the office of [facility].    I understand that my insurance will be billed for this visit.   I have read or had this consent read to me. I understand the contents of this consent, which adequately explains the benefits and risks of the Services being provided via telemedicine.  I have been provided ample opportunity to ask questions regarding this consent and the Services and have had my questions answered to my satisfaction. I give my informed consent for the services to be provided through the use of telemedicine in my medical care

## 2023-10-07 NOTE — Telephone Encounter (Signed)
 Kevin [name] calling to advise that surgery date has been moved up again to 6/17, needing med and pharmacy plavix  2 days prior clearance

## 2023-10-07 NOTE — Telephone Encounter (Signed)
 Me    10/07/23 10:31 AM Note I s/w the pt's niece who called back and she helped set up tele preop appt for the pt for 10/13/23. Med rec and consent are done.        10/07/23  8:42 AM Harrington, April L routed this conversation to Cv Div Preop Callback  Harrington, April L [facility]   10/07/23  8:42 AM Note [name] calling to advise that surgery date has been moved up again to 6/17, needing med and pharmacy plavix  2 days prior clearance       Me    10/05/23 11:59 AM Note Rhonda, surgery scheduler called to update our office surgery date for the pt has been moved up to 11/27/23 fro  01/15/24. I left message for [name] as FYI that I did get the message surgery date moved up; also that we did attempt x 3 to reach the pt to schedule a tele preop appt when originally set for 01/15/24. I left message for [name] that we will reach back out to the pt.      I called and left a detailed message for the pt that he is going to need to schedule a tele preop appt.         10/05/23 11:55 AM You attempted to contact Kevin [name] (Left Message)    10/05/23 11:55 AM You attempted to contact [name], [facility] (Left Message)    10/05/23 11:26 AM Sherryle Don [name] routed this conversation to Cv Div Preop Callback  Kevin [name]   10/05/23 11:26 AM Note Caller [name]) is reporting patient's surgery date has been moved up to 11/27/23.         10/05/23 11:25 AM [name], [facility] contacted Kevin [name], Kevin [name]

## 2023-10-07 NOTE — Telephone Encounter (Signed)
 I s/w the pt's niece who called back and she helped set up tele preop appt for the pt for 10/13/23. Med rec and consent are done.

## 2023-10-07 NOTE — Telephone Encounter (Signed)
 Harrington, April L routed conversation to Cv Div Preop Callback19 minutes ago (8:42 AM)   Lendia Quay, April L19 minutes ago (8:42 AM)   Margeret Sheer calling to advise that surgery date has been moved up again to 6/17, needing med and pharmacy plavix  2 days prior clearance      Note   You2 days ago    [name], surgery scheduler called to update our office surgery date for the pt has been moved up to 11/27/23 fro  01/15/24. I left message for [name] as FYI that I did get the message surgery date moved up; also that we did attempt x 3 to reach the pt to schedule a tele preop appt when originally set for 01/15/24. I left message for [name] that we will reach back out to the pt.      I called and left a detailed message for the pt that he is going to need to schedule a tele preop appt.       Note   You  Bayan, Kushnir [phone] days ago   Kirstie Percy, [facility] [phone] days ago   Sherryle Don B routed conversation to [facility] days ago   Elvan Hamel, Jasmin B2 days ago   [employer] [name]) is reporting patient's surgery date has been moved up to 11/27/23.      Note   [facility] [phone]  Wilson, Jasmin B

## 2023-10-07 NOTE — Telephone Encounter (Signed)
 4th attempt to reach the pt to schedule a tele preop appt. I will update the surgeon office and remove from our preop call back pool until the pt call's back.

## 2023-10-13 ENCOUNTER — Ambulatory Visit: Attending: Cardiology | Admitting: Emergency Medicine

## 2023-10-13 DIAGNOSIS — Z01810 Encounter for preprocedural cardiovascular examination: Secondary | ICD-10-CM | POA: Diagnosis not present

## 2023-10-13 NOTE — Progress Notes (Signed)
 Virtual Visit via Telephone Note   Because of MALICHI PALARDY co-morbid illnesses, he is at least at moderate risk for complications without adequate follow up.  This format is felt to be most appropriate for this patient at this time.  Due to technical limitations with video connection (technology), today's appointment will be conducted as an audio only telehealth visit, and Kevin [name] verbally agreed to proceed in this manner.   All issues noted in this document were discussed and addressed.  No physical exam could be performed with this format.  Evaluation Performed:  Preoperative cardiovascular risk assessment _____________   Date:  10/13/2023   Patient ID:  Kevin [name], DOB [birth year], MRN [id number] Patient Location:  Home Provider location:   Office  Primary Care Provider:  Minus Amel, MD Primary Cardiologist:  Lauro Portal, MD  Chief Complaint / Patient Profile   78 y.o. y/o male with a h/o coronary artery disease s/p remote LAD and circumflex stenting in 2001 and 2004, hypertension, hyperlipidemia, type 2 diabetes, obesity who is pending Fiducial markers by Dr. Cathi Cluster on 10/20/2023 and presents today for telephonic preoperative cardiovascular risk assessment.  History of Present Illness    Kevin [name] is a 78 y.o. male who presents via audio/video conferencing for a telehealth visit today.  Pt was last seen in cardiology clinic on 06/24/2023 by Dr. Katheryne Pane.  At that time Kevin [name] was doing well.  The patient is now pending procedure as outlined above. Since his last visit, he denies chest pain, shortness of breath, lower extremity edema, fatigue, palpitations, melena, hematuria, hemoptysis, diaphoresis, weakness, presyncope, syncope, orthopnea, and PND.  Today patient is doing well overall.  He is without any acute cardiovascular concerns or complaints at this time.  Denies any chest pains or exertional symptoms.  No angina, no indication for further ischemic  evaluation at this time.  Tells me he does stay relatively active.  He does a lot of walking without any symptoms.  Works inside, does dishes, sweets, vacuums, carrying groceries, go gets groceries without any limitation or symptoms.  He is able to walk a block and walk up a flight of stairs without any exertional symptoms.  Overall patient is able to complete greater than 4 METS.  Past Medical History    Past Medical History:  Diagnosis Date   Angio-edema    Aortic atherosclerosis (HCC) 09/15/2023   Arthritis    Asthma    as a child. no problems now.    BPH with elevated PSA and lower urinary tract symptoms    CAD (coronary artery disease)    Cellulitis 09/2009   left leg   Diabetes mellitus    Type 2   Diverticulosis    Diverticulosis    Elevated PSA    HTN (hypertension)    Hyperlipidemia    Incomplete emptying of bladder    Melanoma (HCC) 02/2010   left foot   Melanoma of foot (HCC) 06/02/2011   Microscopic colitis 10/09/2010   Colonoscopy dr Riley Cheadle   Obesity    OSA on CPAP 01/20/2014   Psoriasis    Pyuria 2022   S/P colonoscopy 07/03/2003    Dr [name]->hyperplastic rectal polyp   S/P endoscopy 10/09/2010   lap band intact, duodenal erosions   Seasonal allergies    Skin cancer    Sleep apnea    Urticaria    Weak urine stream    Past Surgical History:  Procedure Laterality Date   ABDOMINAL SURGERY  ADENOIDECTOMY     APPENDECTOMY     CARDIAC CATHETERIZATION N/A 02/14/2015   Procedure: Left Heart Cath and Coronary Angiography;  Surgeon: Lucendia Rusk, MD;  Location: [facility] INVASIVE CV LAB;  Service: Cardiovascular;  Laterality: N/A;   CARDIAC CATHETERIZATION N/A 02/14/2015   Procedure: Coronary Stent Intervention;  Surgeon: Lucendia Rusk, MD;  Location: [facility] INVASIVE CV LAB;  Service: Cardiovascular;  Laterality: N/A;   COLONOSCOPY N/A 10/09/2010   normal rectum, long redundant colon, pancolonic diverticula, 6mm cecal polyp, diminutive polyp at the  appendiceal orifice ablated with tip of hot snare (Biopsies suggestive of microscopic colitis; polyp benign; small bowel ok)   CORONARY ANGIOPLASTY  2002/2005/2008   CORONARY ANGIOPLASTY WITH STENT PLACEMENT  06/11/2007   PTCA & stenting distal CX, PTCA & balloon angioplasty in-stent restenotic mid CX coronary artery stent   ESOPHAGOGASTRODUODENOSCOPY N/A 10/09/2010   Normal esophagus, lap band present, 2nd portion duodenum with erosions without frank ulcer   GASTRIC BANDING PORT REVISION N/A 09/20/2021   Procedure: GASTRIC BAND REMOVAL;  Surgeon: Awilda Bogus, MD;  Location: [facility];  Service: General;  Laterality: N/A;   KNEE SURGERY     left   LAPAROTOMY N/A 09/20/2021   Procedure: EXPLORATORY LAPAROTOMY,  LYSIS OF ADHESIONS, REDUCTION OF VOLVULUS;  Surgeon: Awilda Bogus, MD;  Location: [facility];  Service: General;  Laterality: N/A;   LEFT HEART CATHETERIZATION WITH CORONARY ANGIOGRAM N/A 07/15/2013   Procedure: LEFT HEART CATHETERIZATION WITH CORONARY ANGIOGRAM;  Surgeon: Mickiel Albany, MD;  Location: [facility] CATH LAB;  Service: Cardiovascular;  Laterality: N/A;   NM MYOCAR PERF WALL MOTION  06/24/2011   small mild fixed anteroseptal & anteroapical defects.   PENILE PROSTHESIS IMPLANT     PROSTATE BIOPSY     SHOULDER SURGERY     left   SINOSCOPY     TONSILLECTOMY     TYMPANOSTOMY TUBE PLACEMENT      Allergies  Allergies  Allergen Reactions   Irbesartan Shortness Of Breath and Other (See Comments)   Prednisone  Anaphylaxis, Swelling and Other (See Comments)    Swelling of the lips   Ramipril Shortness Of Breath and Other (See Comments)   Shellfish Allergy  Anaphylaxis    Shrimp, shellfish   Tradjenta [Linagliptin] Shortness Of Breath   Cephalexin Hives and Swelling   Clindamycin Hcl Hives and Swelling   Codeine Nausea And Vomiting and Other (See Comments)    Affects heart   Contrast Media [Iodinated Contrast Media]     Can be administered if Benadryl  is administered  prior to prevent reaction    Crab Extract Other (See Comments)   Fenofibrate Other (See Comments)    "My skin started peeling off"   Glycotrol [Support-500] Other (See Comments)    unknown   Indomethacin Other (See Comments)    Unknown reaction   Minocycline Hcl Hives and Swelling   Morphine And Codeine Nausea And Vomiting    Projectile vomiting   Shrimp Extract Other (See Comments)   Sulfa Drugs Cross Reactors Hives and Swelling   Tetracyclines & Related    Tree Extract Itching    Home Medications    Prior to Admission medications   Medication Sig Start Date End Date Taking? Authorizing Provider  Ascorbic Acid (VITAMIN C) 1000 MG tablet Take 1,000 mg by mouth daily.    [provider]  aspirin  81 MG tablet Take 81 mg by mouth daily.    [provider]  b complex vitamins capsule Take  1 capsule by mouth daily.    [provider]  Cholecalciferol (VITAMIN D3) 25 MCG CAPS Take 1 capsule by mouth daily.    [provider]  clopidogrel  (PLAVIX ) 75 MG tablet TAKE ONE TABLET BY MOUTH ONCE DAILY. 04/23/23   Avanell Leigh, MD  Coenzyme Q10 (CO Q 10) 10 MG CAPS Take 1 capsule by mouth daily.    [provider]  diphenhydrAMINE  (BENADRYL ) 25 MG tablet Take 25 mg by mouth every 6 (six) hours as needed.    [provider]  ezetimibe  (ZETIA ) 10 MG tablet Take 1 tablet (10 mg total) by mouth daily. 03/19/22   Avanell Leigh, MD  fluticasone  (FLONASE ) 50 MCG/ACT nasal spray Place 2 sprays into both nostrils daily.    [provider]  furosemide  (LASIX ) 40 MG tablet Take 1 tablet (40 mg total) by mouth daily. 07/05/23   Justina Oman, MD  JANUVIA 100 MG tablet Take 100 mg by mouth daily. 03/12/22   [provider]  levothyroxine  (SYNTHROID ) 50 MCG tablet Take 50 mcg by mouth daily before breakfast.    [provider]  metoprolol  (TOPROL -XL) 100 MG 24 hr tablet Take 100 mg by mouth daily.    [provider]   MOUNJARO 2.5 MG/0.5ML Pen Inject 2.5 mg into the skin once a week. 08/11/23   [provider]  niacin  (NIASPAN ) 1000 MG CR tablet TAKE 1 TABLET BY MOUTH AT BEDTIME. 01/30/23   Avanell Leigh, MD  Omega-3 Fatty Acids (FISH OIL PO) Take 1 capsule by mouth daily.    [provider]  OVER THE COUNTER MEDICATION INFLAMMATION VITAMIN    [provider]  pravastatin  (PRAVACHOL ) 40 MG tablet Take 1 tablet (40 mg total) by mouth every evening. 04/18/19   Avanell Leigh, MD  tamsulosin  (FLOMAX ) 0.4 MG CAPS capsule Take 0.4 mg by mouth daily. 07/17/20   [provider]  valsartan -hydrochlorothiazide  (DIOVAN -HCT) 160-12.5 MG tablet Take 1 tablet by mouth daily. 09/07/23   [provider]    Physical Exam    Vital Signs:  MONTREL DONAHOE does not have vital signs available for review today.  Given telephonic nature of communication, physical exam is limited. AAOx3. NAD. Normal affect.  Speech and respirations are unlabored.  Accessory Clinical Findings    None  Assessment & Plan    1.  Preoperative Cardiovascular Risk Assessment: According to the Revised Cardiac Risk Index (RCRI), his Perioperative Risk of Major Cardiac Event is (%): 0.9. His Functional Capacity in METs is: 5.07 according to the Duke Activity Status Index (DASI). Therefore, based on ACC/AHA guidelines, patient would be at acceptable risk for the planned procedure without further cardiovascular testing.  The patient was advised that if he develops new symptoms prior to surgery to contact our office to arrange for a follow-up visit, and he verbalized understanding.  Per office protocol he may hold Plavix  for 5 days prior to procedure and should resume as soon as hemodynamically stable postoperatively.   A copy of this note will be routed to requesting surgeon.  Time:   Today, I have spent 7 minutes with the patient with telehealth technology discussing medical history, symptoms, and  management plan.     Ava Boatman, NP  10/13/2023, 2:09 PM

## 2023-10-15 ENCOUNTER — Encounter (HOSPITAL_COMMUNITY): Payer: Self-pay | Admitting: Urology

## 2023-10-15 NOTE — Progress Notes (Signed)
 Spoke w/ via phone for pre-op interview--- pt Lab needs dos----     [employer] results------ current EKG in epic/ chart COVID test -----patient states asymptomatic no test needed Arrive at ------- 0530 on 10-20-2023 NPO after MN w/ exception sips of water w/ meds Pre-Surgery Ensure or G2: n/a  Med rec completed Medications to take morning of surgery ----- toprol , synthroid , flonase  nasal spray Diabetic medication ----- do not take januvia  GLP1 agonist last dose: pt stated last dose 10-05-2023 GLP1 instructions: pt stated was given instructions by surgeon office to stop prior to surgery  Patient instructed no nail polish to be worn day of surgery Patient instructed to bring photo id and insurance card day of surgery Patient aware to have Driver (ride ) / caregiver    for 24 hours after surgery - niece, michelle norge Patient Special Instructions ----- shower w/ antibacterial soap morning of surgery Fleet enema night before surgery Pre-Op special Instructions -----  pt has telephone cardiac clearance by Palmer Bobo NP dated 10-13-2023 in epic/ chart  Patient verbalized understanding of instructions that were given at this phone interview. Patient denies chest pain, sob, fever, cough at the interview.    Anesthesia Review:  CAD hx MI 2001/  NSTEMI 10/ 10/ 2016  s/p multiple cath's w/ interventions w/ PCI/ BMS's/ DES';  OSA (stated no cpap since 2023 after losing 140lb);  DM 2;  CKD 3;  Chronic venous statis w/ BLE edema Pt denies cardiac s&s, sob, and stated when up during the day legs swell but after elevating them at the end of the day swelling goes down.  PCP:  Dr Chapman Commodore Cardiologist :  Dr Katheryne Pane Holli Lunger 06-24-2023)  Chest x-ray :  03-05-2023 EKG : 06-24-2023 Echo : no Stress test: NUC 11-12-2016 Cardiac Cath :  02-14-2015  Activity level: denies sob w/ any activity Sleep Study/ CPAP :  yes/ no Fasting Blood Sugar : / Checks Blood Sugar -- times a day:  does not  check  Blood Thinner/ Instructions /Last Dose:  plavix  ASA / Instructions/ Last Dose : asa 81mg  Pt stated was given instructions for plavix  and asa 81mg  by dr Cathi Cluster office.  Stated last dose for asa 10-10-2023 and last dose plavix  10-11-2023.  Stated he is aware he stop plavix  sooner than recommended by cardiology.said he has done this before without any problems.

## 2023-10-18 NOTE — H&P (Signed)
 H&P   Chief Complaint: Prostate cancer   History of Present Illness: 3 M w/ hx of ED and IPP recently diagnosed with GG3 PCa (Gleason 4+3) with a PSA that increased to 15 prior to biopsy. PSMA after biopsy showed L iliac node concerning for metastasis. Prostate 84 grams   PMH: hx of CAD on Plavix  and ASA, hx of 3 stents.   Past Medical History:  Diagnosis Date  . Aortic atherosclerosis (HCC) 09/15/2023  . Benign localized prostatic hyperplasia with lower urinary tract symptoms (LUTS)   . CAD (coronary artery disease) 2001   cardiologist-- dr Katheryne Pane;    11/ 2001  acute anferior MI  s/p cath w/ PCI ostial diag & PTCA & BMS to pLAD;   Cath 12/ 2004 stent to mCFx;  cath 02/ 2009 PTCA BMS to dCFx & PTCA/ [place] angio DES to mCFx; cath 07/2013 TO nondominant RCA/ patent LAD& CFx stents;  cath 10/ 2016 DES to mLAD for ISR;  NUC 11-12-2016 LR w/ normal perfusion, nuclear ef 59%  . Carpal tunnel syndrome, bilateral   . Chronic diastolic CHF (congestive heart failure) (HCC) 07/2013  . Chronic rhinitis   . Chronic venous stasis    history s/p left GSV ablation yrs ago  . CKD (chronic kidney disease), stage III (HCC)   . Diverticulosis of colon   . Edema of both lower extremities   . Environmental and seasonal allergies   . History of acute anterior wall MI 2001  . History of malignant melanoma 02/2010   previously followed by oncology-- dr Clydell Darnel (released 2017)  dx 10/ 2011;  02-18-2010  s/p WLE left foot ;   Stage IA,  no SLN bx,  no other treatment and no recurrence  . History of non-ST elevation myocardial infarction (NSTEMI) 02/13/2015  . History of small bowel obstruction 09/20/2021   s/p  surgical intervention  . HTN (hypertension)   . Hyperlipidemia, mixed   . Hypothyroidism   . Incomplete emptying of bladder   . Malignant neoplasm prostate [facility]) 08/2023   primary urology--- dr Reanna Scoggin/  radiation oncology--- dr Lorri Rota;   dx 04/ 2025,  Gleason 4+3,  PSA 15.2  . OA  (osteoarthritis)    knees  . OSA (obstructive sleep apnea)    10-15-2023  pt stated does use CPAP since 2023 after losing 140 lb  . Psoriasis   . S/p bare metal coronary artery stent    11/ 2001  BMS to pLAD;     02/ 2009 BMS to distal dominant CFx  . S/P drug eluting coronary stent placement    02/ 2009  DES to mCFx;  DES to mLAD  . Type 2 diabetes mellitus (HCC)    followed by pcp   (10-15-2023  pt stated does check blood sugars at home)  . Ventral hernia without obstruction or gangrene    followed by general surgeon--- dr l. bridges  . Weak urine stream    Past Surgical History:  Procedure Laterality Date  . APPENDECTOMY     1950s  . CARDIAC CATHETERIZATION N/A 02/14/2015   Procedure: Left Heart Cath and Coronary Angiography;  Surgeon: Lucendia Rusk, MD;  Location: [facility] INVASIVE CV LAB;  Service: Cardiovascular;  Laterality: N/A;  . CARDIAC CATHETERIZATION N/A 02/14/2015   Procedure: Coronary Stent Intervention;  Surgeon: Lucendia Rusk, MD;  Location: [facility] INVASIVE CV LAB;  Service: Cardiovascular;  Laterality: N/A;  . CATARACT EXTRACTION W/ INTRAOCULAR LENS IMPLANT Bilateral 2007  . COLONOSCOPY N/A 10/09/2010  normal rectum, long redundant colon, pancolonic diverticula, 6mm cecal polyp, diminutive polyp at the appendiceal orifice ablated with tip of hot snare (Biopsies suggestive of microscopic colitis; polyp benign; small bowel ok)  . CORONARY ANGIOPLASTY WITH STENT PLACEMENT  06/11/2007   @[facility]   by dr Berry Bristol;   PTCA & BMS distal CX, PTCA & balloon angioplasty in-stent restenotic & DES to mid CX  . CORONARY ANGIOPLASTY WITH STENT PLACEMENT  03/2000   @ [facility]  by dr b. Grandville Lax;  PCI to ostial diagnal ;  PTCA and BMS to pLAD  . CORONARY ANGIOPLASTY WITH STENT PLACEMENT  04/2003   @[facility]  by dr Nikki Barters;  stent to midCFx  . ESOPHAGOGASTRODUODENOSCOPY N/A 10/09/2010   Normal esophagus, lap band present, 2nd portion duodenum with erosions without frank ulcer  . GASTRIC BANDING PORT  REVISION N/A 09/20/2021   Procedure: GASTRIC BAND REMOVAL;  Surgeon: Awilda Bogus, MD;  Location: [facility]S;  Service: General;  Laterality: N/A;  . KNEE ARTHROSCOPY Bilateral    left 10/ 2004;   right 2006  . LAPAROSCOPIC GASTRIC BANDING  2009  . LAPAROTOMY N/A 09/20/2021   Procedure: EXPLORATORY LAPAROTOMY,  LYSIS OF ADHESIONS, REDUCTION OF VOLVULUS;  Surgeon: Awilda Bogus, MD;  Location: [facility];  Service: General;  Laterality: N/A;   REMOVAL GASTRIC LAP BAND  . LEFT HEART CATHETERIZATION WITH CORONARY ANGIOGRAM N/A 07/15/2013   Procedure: LEFT HEART CATHETERIZATION WITH CORONARY ANGIOGRAM;  Surgeon: Mickiel Albany, MD;  Location: [facility] CATH LAB;  Service: Cardiovascular;  Laterality: N/A;  . MELANOMA EXCISION Left 02/18/2010   @[facility]  by dr b. Hildy Lowers;   WLE MELANOMA OF LEFT FOOT  . NASAL SEPTUM SURGERY     1990s  . NASAL/SINUS ENDOSCOPY  09/18/2006   @[facility]  by dr d. shoemaker;   REVISION SEPTOPLASTY/ RIGHT INFERIOR TURBINATE REDUCTION/ LEFT TOTAL ETHMOIDECTOMY  . PENILE PROSTHESIS IMPLANT  1996  . SHOULDER SURGERY Left 2004   for staph infection  . TONSILLECTOMY AND ADENOIDECTOMY     child  . TYMPANOSTOMY TUBE PLACEMENT Bilateral    1970s    Home Medications:  No medications prior to admission.   Allergies:  Allergies  Allergen Reactions  . Irbesartan Shortness Of Breath and Other (See Comments)  . Prednisone  Anaphylaxis, Swelling and Other (See Comments)    Swelling of the lips  . Ramipril Shortness Of Breath and Other (See Comments)  . Shellfish Allergy  Anaphylaxis    ALL SHELLFISH  Shrimp/  crab  . Tradjenta [Linagliptin] Shortness Of Breath  . Cephalexin Hives and Swelling  . Clindamycin Hcl Hives and Swelling  . Codeine Nausea And Vomiting and Other (See Comments)    Affects heart  . Contrast Media [Iodinated Contrast Media] Other (See Comments)    Very sick  Can be administered if Benadryl  is administered prior to prevent reaction   . Fenofibrate Other (See  Comments)    My skin started peeling off  . Glycotrol [Support-500] Other (See Comments)    unknown  . Indomethacin Other (See Comments)    Unknown reaction  . Minocycline Hcl Hives and Swelling  . Morphine And Codeine Nausea And Vomiting    Projectile vomiting  . Sulfa Drugs Cross Reactors Hives and Swelling  . Tetracyclines & Related Other (See Comments)    WHEN EXPOSED TO SUN SKIN TURNED BLUISH     Family History  Problem Relation Age of Onset  . Coronary artery disease Mother   . Diabetes Father   . Liver cancer  Brother 86  . Celiac disease Other 45       brother's daughter  . Allergic rhinitis Neg Hx   . Angioedema Neg Hx   . Asthma Neg Hx   . Atopy Neg Hx   . Eczema Neg Hx   . Immunodeficiency Neg Hx   . Urticaria Neg Hx    Social History:  reports that he has quit smoking. His smoking use included cigarettes. He has been exposed to tobacco smoke. He quit smokeless tobacco use about 34 years ago.  His smokeless tobacco use included snuff and chew. He reports that he does not currently use alcohol. He reports that he does not use drugs.  ROS: A complete review of systems was performed.  All systems are negative except for pertinent findings as noted. ROS   Physical Exam:  Vital signs in last 24 hours:   General:  Alert and oriented, No acute distress HEENT: Normocephalic, atraumatic Neck: No JVD or lymphadenopathy Cardiovascular: Regular rate and rhythm Lungs: Regular rate and effort Abdomen: Soft, nontender, nondistended, no abdominal masses Back: No CVA tenderness Extremities: No edema Neurologic: Grossly intact  Laboratory Data:  No results found for this or any previous visit (from the past 24 hours). No results found for this or any previous visit (from the past 240 hours). Creatinine: No results for input(s): CREATININE in the last 168 hours.  Impression/Assessment:  50 M w/ hx of ED and IPP recently diagnosed with GG3 PCa (Gleason 4+3) with a  PSA that increased to 15 prior to biopsy. PSMA after biopsy showed L iliac node concerning for metastasis. Prostate 84 grams   *** pt has stopped Plavix   Plan:  Plan for SPACE OAR and fiducial markers today.   Kevin [name] 10/18/2023, 9:45 AM

## 2023-10-19 NOTE — Anesthesia Preprocedure Evaluation (Signed)
 Anesthesia Evaluation  Patient identified by MRN, date of birth, ID band Patient awake    Reviewed: Allergy  & Precautions, H&P , NPO status , Patient's Chart, lab work & pertinent test results  Airway Mallampati: III  TM Distance: >3 FB Neck ROM: Full    Dental no notable dental hx. (+) Teeth Intact, Dental Advisory Given   Pulmonary sleep apnea , former smoker   Pulmonary exam normal breath sounds clear to auscultation       Cardiovascular Exercise Tolerance: Good hypertension, Pt. on medications and Pt. on home beta blockers + CAD, + Past MI, + Cardiac Stents and +CHF   Rhythm:Regular Rate:Normal     Neuro/Psych negative neurological ROS  negative psych ROS   GI/Hepatic negative GI ROS, Neg liver ROS,,,  Endo/Other  diabetes, Type 2, Oral Hypoglycemic AgentsHypothyroidism    Renal/GU Renal InsufficiencyRenal disease  negative genitourinary   Musculoskeletal  (+) Arthritis ,    Abdominal   Peds  Hematology negative hematology ROS (+)   Anesthesia Other Findings   Reproductive/Obstetrics negative OB ROS                             Anesthesia Physical Anesthesia Plan  ASA: 3  Anesthesia Plan: MAC   Post-op Pain Management: Tylenol  PO (pre-op)*   Induction: Intravenous  PONV Risk Score and Plan: 2 and Propofol  infusion and Ondansetron   Airway Management Planned: Natural Airway and Simple Face Mask  Additional Equipment:   Intra-op Plan:   Post-operative Plan:   Informed Consent: I have reviewed the patients History and Physical, chart, labs and discussed the procedure including the risks, benefits and alternatives for the proposed anesthesia with the patient or authorized representative who has indicated his/her understanding and acceptance.     Dental advisory given  Plan Discussed with: CRNA  Anesthesia Plan Comments:        Anesthesia Quick Evaluation

## 2023-10-20 ENCOUNTER — Encounter (HOSPITAL_COMMUNITY): Payer: Self-pay | Admitting: Emergency Medicine

## 2023-10-20 ENCOUNTER — Emergency Department (HOSPITAL_COMMUNITY)

## 2023-10-20 ENCOUNTER — Encounter (HOSPITAL_COMMUNITY)
Admission: RE | Disposition: A | Discharge status: Home / Self Care | Payer: Self-pay | Source: Home / Self Care | Attending: Urology

## 2023-10-20 ENCOUNTER — Encounter (HOSPITAL_COMMUNITY): Payer: Self-pay | Admitting: Urology

## 2023-10-20 ENCOUNTER — Ambulatory Visit (HOSPITAL_COMMUNITY)
Admission: RE | Admit: 2023-10-20 | Discharge: 2023-10-20 | Disposition: A | Discharge status: Home / Self Care | Attending: Urology | Admitting: Urology

## 2023-10-20 ENCOUNTER — Emergency Department (HOSPITAL_COMMUNITY)
Admission: EM | Admit: 2023-10-20 | Discharge: 2023-10-20 | Disposition: A | Discharge status: Home / Self Care | Source: Home / Self Care | Attending: Emergency Medicine | Admitting: Emergency Medicine

## 2023-10-20 ENCOUNTER — Ambulatory Visit (HOSPITAL_COMMUNITY): Payer: Self-pay | Admitting: Anesthesiology

## 2023-10-20 ENCOUNTER — Other Ambulatory Visit: Payer: Self-pay

## 2023-10-20 DIAGNOSIS — Z01818 Encounter for other preprocedural examination: Secondary | ICD-10-CM

## 2023-10-20 DIAGNOSIS — R0789 Other chest pain: Secondary | ICD-10-CM | POA: Insufficient documentation

## 2023-10-20 DIAGNOSIS — I5032 Chronic diastolic (congestive) heart failure: Secondary | ICD-10-CM | POA: Insufficient documentation

## 2023-10-20 DIAGNOSIS — J984 Other disorders of lung: Secondary | ICD-10-CM | POA: Diagnosis not present

## 2023-10-20 DIAGNOSIS — Z955 Presence of coronary angioplasty implant and graft: Secondary | ICD-10-CM | POA: Insufficient documentation

## 2023-10-20 DIAGNOSIS — N1831 Chronic kidney disease, stage 3a: Secondary | ICD-10-CM | POA: Diagnosis not present

## 2023-10-20 DIAGNOSIS — Z87891 Personal history of nicotine dependence: Secondary | ICD-10-CM | POA: Insufficient documentation

## 2023-10-20 DIAGNOSIS — Z7982 Long term (current) use of aspirin: Secondary | ICD-10-CM | POA: Insufficient documentation

## 2023-10-20 DIAGNOSIS — N183 Chronic kidney disease, stage 3 unspecified: Secondary | ICD-10-CM | POA: Diagnosis not present

## 2023-10-20 DIAGNOSIS — Z8546 Personal history of malignant neoplasm of prostate: Secondary | ICD-10-CM | POA: Insufficient documentation

## 2023-10-20 DIAGNOSIS — Z79899 Other long term (current) drug therapy: Secondary | ICD-10-CM | POA: Insufficient documentation

## 2023-10-20 DIAGNOSIS — E039 Hypothyroidism, unspecified: Secondary | ICD-10-CM | POA: Insufficient documentation

## 2023-10-20 DIAGNOSIS — R531 Weakness: Secondary | ICD-10-CM | POA: Insufficient documentation

## 2023-10-20 DIAGNOSIS — I130 Hypertensive heart and chronic kidney disease with heart failure and stage 1 through stage 4 chronic kidney disease, or unspecified chronic kidney disease: Secondary | ICD-10-CM | POA: Diagnosis not present

## 2023-10-20 DIAGNOSIS — C61 Malignant neoplasm of prostate: Secondary | ICD-10-CM | POA: Diagnosis not present

## 2023-10-20 DIAGNOSIS — G4733 Obstructive sleep apnea (adult) (pediatric): Secondary | ICD-10-CM | POA: Diagnosis not present

## 2023-10-20 DIAGNOSIS — I252 Old myocardial infarction: Secondary | ICD-10-CM | POA: Insufficient documentation

## 2023-10-20 DIAGNOSIS — R0602 Shortness of breath: Secondary | ICD-10-CM | POA: Insufficient documentation

## 2023-10-20 DIAGNOSIS — Z7984 Long term (current) use of oral hypoglycemic drugs: Secondary | ICD-10-CM | POA: Diagnosis not present

## 2023-10-20 DIAGNOSIS — R197 Diarrhea, unspecified: Secondary | ICD-10-CM | POA: Insufficient documentation

## 2023-10-20 DIAGNOSIS — I509 Heart failure, unspecified: Secondary | ICD-10-CM

## 2023-10-20 DIAGNOSIS — Z7902 Long term (current) use of antithrombotics/antiplatelets: Secondary | ICD-10-CM | POA: Insufficient documentation

## 2023-10-20 DIAGNOSIS — E1122 Type 2 diabetes mellitus with diabetic chronic kidney disease: Secondary | ICD-10-CM | POA: Diagnosis not present

## 2023-10-20 DIAGNOSIS — I2510 Atherosclerotic heart disease of native coronary artery without angina pectoris: Secondary | ICD-10-CM | POA: Insufficient documentation

## 2023-10-20 DIAGNOSIS — E860 Dehydration: Secondary | ICD-10-CM | POA: Diagnosis not present

## 2023-10-20 DIAGNOSIS — R110 Nausea: Secondary | ICD-10-CM | POA: Insufficient documentation

## 2023-10-20 DIAGNOSIS — J9811 Atelectasis: Secondary | ICD-10-CM | POA: Diagnosis not present

## 2023-10-20 DIAGNOSIS — R600 Localized edema: Secondary | ICD-10-CM | POA: Insufficient documentation

## 2023-10-20 DIAGNOSIS — N189 Chronic kidney disease, unspecified: Secondary | ICD-10-CM | POA: Diagnosis not present

## 2023-10-20 HISTORY — DX: Presence of coronary angioplasty implant and graft: Z95.5

## 2023-10-20 HISTORY — DX: Localized edema: R60.0

## 2023-10-20 HISTORY — DX: Obstructive sleep apnea (adult) (pediatric): G47.33

## 2023-10-20 HISTORY — DX: Other allergic rhinitis: J30.89

## 2023-10-20 HISTORY — DX: Carpal tunnel syndrome, bilateral upper limbs: G56.03

## 2023-10-20 HISTORY — DX: Diverticulosis of large intestine without perforation or abscess without bleeding: K57.30

## 2023-10-20 HISTORY — DX: Benign prostatic hyperplasia with lower urinary tract symptoms: N40.1

## 2023-10-20 HISTORY — PX: SPACE OAR INSTILLATION: SHX6769

## 2023-10-20 HISTORY — PX: GOLD SEED IMPLANT: SHX6343

## 2023-10-20 HISTORY — DX: Other specified disorders of veins: I87.8

## 2023-10-20 HISTORY — DX: Chronic kidney disease, stage 3 unspecified: N18.30

## 2023-10-20 HISTORY — DX: Chronic rhinitis: J31.0

## 2023-10-20 HISTORY — DX: Type 2 diabetes mellitus without complications: E11.9

## 2023-10-20 HISTORY — DX: Mixed hyperlipidemia: E78.2

## 2023-10-20 HISTORY — DX: Hypothyroidism, unspecified: E03.9

## 2023-10-20 HISTORY — DX: Ventral hernia without obstruction or gangrene: K43.9

## 2023-10-20 HISTORY — DX: Unspecified osteoarthritis, unspecified site: M19.90

## 2023-10-20 LAB — COMPREHENSIVE METABOLIC PANEL WITH GFR
ALT: 13 U/L (ref 0–44)
AST: 13 U/L — ABNORMAL LOW (ref 15–41)
Albumin: 4.5 g/dL (ref 3.5–5.0)
Alkaline Phosphatase: 28 U/L — ABNORMAL LOW (ref 38–126)
Anion gap: 11 (ref 5–15)
BUN: 56 mg/dL — ABNORMAL HIGH (ref 8–23)
CO2: 22 mmol/L (ref 22–32)
Calcium: 9.7 mg/dL (ref 8.9–10.3)
Chloride: 102 mmol/L (ref 98–111)
Creatinine, Ser: 1.94 mg/dL — ABNORMAL HIGH (ref 0.61–1.24)
GFR, Estimated: 35 mL/min — ABNORMAL LOW (ref >60)
Glucose, Bld: 96 mg/dL (ref 70–99)
Potassium: 3.6 mmol/L (ref 3.5–5.1)
Sodium: 135 mmol/L (ref 135–145)
Total Bilirubin: 1 mg/dL (ref 0.0–1.2)
Total Protein: 7.1 g/dL (ref 6.5–8.1)

## 2023-10-20 LAB — POCT I-STAT, CHEM 8
BUN: 63 mg/dL — ABNORMAL HIGH (ref 8–23)
Calcium, Ion: 1.23 mmol/L (ref 1.15–1.40)
Chloride: 108 mmol/L (ref 98–111)
Creatinine, Ser: 2.6 mg/dL — ABNORMAL HIGH (ref 0.61–1.24)
Glucose, Bld: 151 mg/dL — ABNORMAL HIGH (ref 70–99)
HCT: 31 % — ABNORMAL LOW (ref 39.0–52.0)
Hemoglobin: 10.5 g/dL — ABNORMAL LOW (ref 13.0–17.0)
Potassium: 4.1 mmol/L (ref 3.5–5.1)
Sodium: 139 mmol/L (ref 135–145)
TCO2: 20 mmol/L — ABNORMAL LOW (ref 22–32)

## 2023-10-20 LAB — URINALYSIS, W/ REFLEX TO CULTURE (INFECTION SUSPECTED)
Bacteria, UA: NONE SEEN
Bilirubin Urine: NEGATIVE
Glucose, UA: 50 mg/dL — AB
Hgb urine dipstick: NEGATIVE
Ketones, ur: NEGATIVE mg/dL
Leukocytes,Ua: NEGATIVE
Nitrite: NEGATIVE
Protein, ur: NEGATIVE mg/dL
Specific Gravity, Urine: 1.005 (ref 1.005–1.030)
pH: 5 (ref 5.0–8.0)

## 2023-10-20 LAB — CBC WITH DIFFERENTIAL/PLATELET
Abs Immature Granulocytes: 0.01 10*3/uL (ref 0.00–0.07)
Basophils Absolute: 0 10*3/uL (ref 0.0–0.1)
Basophils Relative: 1 %
Eosinophils Absolute: 0.2 10*3/uL (ref 0.0–0.5)
Eosinophils Relative: 3 %
HCT: 32.5 % — ABNORMAL LOW (ref 39.0–52.0)
Hemoglobin: 10.8 g/dL — ABNORMAL LOW (ref 13.0–17.0)
Immature Granulocytes: 0 %
Lymphocytes Relative: 18 %
Lymphs Abs: 1 10*3/uL (ref 0.7–4.0)
MCH: 32.1 pg (ref 26.0–34.0)
MCHC: 33.2 g/dL (ref 30.0–36.0)
MCV: 96.7 fL (ref 80.0–100.0)
Monocytes Absolute: 0.6 10*3/uL (ref 0.1–1.0)
Monocytes Relative: 11 %
Neutro Abs: 3.7 10*3/uL (ref 1.7–7.7)
Neutrophils Relative %: 67 %
Platelets: 108 10*3/uL — ABNORMAL LOW (ref 150–400)
RBC: 3.36 MIL/uL — ABNORMAL LOW (ref 4.22–5.81)
RDW: 12.3 % (ref 11.5–15.5)
WBC: 5.5 10*3/uL (ref 4.0–10.5)
nRBC: 0 % (ref 0.0–0.2)

## 2023-10-20 LAB — GLUCOSE, CAPILLARY: Glucose-Capillary: 142 mg/dL — ABNORMAL HIGH (ref 70–99)

## 2023-10-20 LAB — TROPONIN I (HIGH SENSITIVITY)
Troponin I (High Sensitivity): 2 ng/L (ref <18)
Troponin I (High Sensitivity): 2 ng/L (ref <18)

## 2023-10-20 SURGERY — INSERTION, GOLD SEEDS
Anesthesia: Monitor Anesthesia Care | Site: Prostate

## 2023-10-20 MED ORDER — PROPOFOL 500 MG/50ML IV EMUL
INTRAVENOUS | Status: DC | PRN
  Administered 2023-10-20: 100 ug/kg/min via INTRAVENOUS

## 2023-10-20 MED ORDER — LACTATED RINGERS IV SOLN
INTRAVENOUS | Status: DC | PRN
Start: 2023-10-20 — End: 2023-10-20

## 2023-10-20 MED ORDER — CHLORHEXIDINE GLUCONATE 0.12 % MT SOLN
15.0000 mL | Freq: Once | OROMUCOSAL | Status: AC
  Administered 2023-10-20: 15 mL via OROMUCOSAL

## 2023-10-20 MED ORDER — SODIUM CHLORIDE 0.9 % IV BOLUS
1000.0000 mL | Freq: Once | INTRAVENOUS | Status: AC
  Administered 2023-10-20: 1000 mL via INTRAVENOUS

## 2023-10-20 MED ORDER — PHENYLEPHRINE HCL-NACL 20-0.9 MG/250ML-% IV SOLN
INTRAVENOUS | Status: DC | PRN
  Administered 2023-10-20: 50 ug/min via INTRAVENOUS

## 2023-10-20 MED ORDER — FLEET ENEMA RE ENEM
1.0000 | ENEMA | Freq: Once | RECTAL | Status: DC
  Filled 2023-10-20: qty 1

## 2023-10-20 MED ORDER — SODIUM CHLORIDE (PF) 0.9 % IJ SOLN
INTRAMUSCULAR | Status: AC
Start: 2023-10-20 — End: 2023-10-20
  Filled 2023-10-20: qty 10

## 2023-10-20 MED ORDER — SODIUM CHLORIDE 0.9 % IV SOLN: INTRAVENOUS | Status: DC

## 2023-10-20 MED ORDER — VANCOMYCIN HCL 1500 MG/300ML IV SOLN
1500.0000 mg | Freq: Once | INTRAVENOUS | Status: AC
  Administered 2023-10-20: 1500 mg via INTRAVENOUS
  Filled 2023-10-20: qty 300

## 2023-10-20 MED ORDER — ALBUMIN HUMAN 5 % IV SOLN: INTRAVENOUS | Status: DC | PRN

## 2023-10-20 MED ORDER — PROPOFOL 10 MG/ML IV BOLUS
INTRAVENOUS | Status: AC
  Filled 2023-10-20: qty 20

## 2023-10-20 MED ORDER — VANCOMYCIN HCL 1500 MG/300ML IV SOLN: 1000.0000 mg | Freq: Once | INTRAVENOUS | Status: DC

## 2023-10-20 MED ORDER — CHLORHEXIDINE GLUCONATE 0.12 % MT SOLN
OROMUCOSAL | Status: AC
  Filled 2023-10-20: qty 15

## 2023-10-20 MED ORDER — PHENYLEPHRINE HCL (PRESSORS) 10 MG/ML IV SOLN
INTRAVENOUS | Status: DC | PRN
  Administered 2023-10-20 (×7): 160 ug via INTRAVENOUS

## 2023-10-20 MED ORDER — LIDOCAINE HCL (PF) 1 % IJ SOLN
INTRAMUSCULAR | Status: DC | PRN
  Administered 2023-10-20: 5 mL

## 2023-10-20 MED ORDER — VASOPRESSIN 20 UNIT/ML IV SOLN
INTRAVENOUS | Status: DC | PRN
  Administered 2023-10-20: 1 [IU] via INTRAVENOUS

## 2023-10-20 MED ORDER — PROPOFOL 10 MG/ML IV BOLUS
INTRAVENOUS | Status: DC | PRN
Start: 2023-10-20 — End: 2023-10-20
  Administered 2023-10-20: 20 mg via INTRAVENOUS

## 2023-10-20 MED ORDER — EPHEDRINE SULFATE (PRESSORS) 50 MG/ML IJ SOLN
INTRAMUSCULAR | Status: DC | PRN
Start: 2023-10-20 — End: 2023-10-20
  Administered 2023-10-20 (×4): 5 mg via INTRAVENOUS

## 2023-10-20 MED ORDER — OXYCODONE HCL 5 MG PO TABS: 5.0000 mg | ORAL_TABLET | Freq: Four times a day (QID) | ORAL | 0 refills | Status: DC | PRN

## 2023-10-20 MED ORDER — SODIUM CHLORIDE (PF) 0.9 % IJ SOLN
INTRAMUSCULAR | Status: DC | PRN
  Administered 2023-10-20: 10 mL

## 2023-10-20 MED ORDER — ACETAMINOPHEN 500 MG PO TABS
ORAL_TABLET | ORAL | Status: AC
  Filled 2023-10-20: qty 2

## 2023-10-20 MED ORDER — FENTANYL CITRATE (PF) 250 MCG/5ML IJ SOLN
INTRAMUSCULAR | Status: AC
Start: 2023-10-20 — End: 2023-10-20
  Filled 2023-10-20: qty 5

## 2023-10-20 MED ORDER — ORAL CARE MOUTH RINSE
15.0000 mL | Freq: Once | OROMUCOSAL | Status: AC
Start: 2023-10-20 — End: 2023-10-20

## 2023-10-20 MED ORDER — INSULIN ASPART 100 UNIT/ML IJ SOLN: 0.0000 [IU] | INTRAMUSCULAR | Status: DC | PRN

## 2023-10-20 MED ORDER — LIDOCAINE HCL 1 % IJ SOLN
INTRAMUSCULAR | Status: AC
Start: 2023-10-20 — End: 2023-10-20
  Filled 2023-10-20: qty 20

## 2023-10-20 MED ORDER — VANCOMYCIN HCL IN DEXTROSE 1-5 GM/200ML-% IV SOLN
INTRAVENOUS | Status: AC
  Filled 2023-10-20: qty 200

## 2023-10-20 MED ORDER — ACETAMINOPHEN 500 MG PO TABS
1000.0000 mg | ORAL_TABLET | Freq: Once | ORAL | Status: AC
  Administered 2023-10-20: 1000 mg via ORAL

## 2023-10-20 SURGICAL SUPPLY — 15 items
DRSG TEGADERM 4X4.75 (GAUZE/BANDAGES/DRESSINGS) ×2 IMPLANT
DRSG TEGADERM 8X12 (GAUZE/BANDAGES/DRESSINGS) ×2 IMPLANT
GLOVE BIO SURGEON STRL SZ8 (GLOVE) ×2 IMPLANT
GLOVE BIOGEL PI IND STRL 6.5 (GLOVE) IMPLANT
GLOVE SURG SS PI 7.0 STRL IVOR (GLOVE) IMPLANT
HIBICLENS CHG 4% 4OZ BTL (MISCELLANEOUS) IMPLANT
IMPL SPACEOAR SYSTEM 10ML (Spacer) ×2 IMPLANT
KIT BASIN OR (CUSTOM PROCEDURE TRAY) IMPLANT
KIT TURNOVER KIT B (KITS) ×2 IMPLANT
MARKER GOLD PRELOAD 1.2X3 (Urological Implant) ×2 IMPLANT
NDL SPNL 22GX3.5 QUINCKE BK (NEEDLE) IMPLANT
NEEDLE SPNL 22GX3.5 QUINCKE BK (NEEDLE) ×1 IMPLANT
SPIKE FLUID TRANSFER (MISCELLANEOUS) ×2 IMPLANT
SURGILUBE 2OZ TUBE FLIPTOP (MISCELLANEOUS) ×2 IMPLANT
UNDERPAD 30X36 HEAVY ABSORB (UNDERPADS AND DIAPERS) ×2 IMPLANT

## 2023-10-20 NOTE — Discharge Instructions (Addendum)
 We evaluated you for your episode of weakness.  Your testing in the emergency department is reassuring.  Your kidney function is better than it was this morning when it was checked at the surgical center.  Your symptoms are most likely due to dehydration.  Your other testing including cardiac enzyme testing was reassuring.  Please follow-up closely with your primary doctor.  I would hold your Lasix  (furosemide ) for the next 2 to 3 days and try to increase your fluid intake.  If you develop any new symptoms such as fevers or chills, difficulty breathing, abdominal pain, painful urination, bloody stools, or any other new symptoms, please return to the emergency department for recheck.

## 2023-10-20 NOTE — Transfer of Care (Signed)
 Immediate Anesthesia Transfer of Care Note  Patient: Kevin [name]  Procedure(s) Performed: INSERTION, GOLD SEEDS (Prostate) INJECTION, HYDROGEL SPACER (Prostate)  Patient Location: PACU  Anesthesia Type:MAC  Level of Consciousness: awake, alert , oriented, and patient cooperative  Airway & Oxygen Therapy: Patient Spontanous Breathing and Patient connected to face mask oxygen  Post-op Assessment: Report given to RN, Post -op Vital signs reviewed and stable, Patient moving all extremities, Patient moving all extremities X 4, and Patient able to stick tongue midline  Post vital signs: Reviewed and stable  Last Vitals:  Vitals Value Taken Time  BP 126/61 10/20/23 08:30  Temp 36.6 C 10/20/23 08:25  Pulse 67 10/20/23 08:32  Resp 13 10/20/23 08:32  SpO2 98 % 10/20/23 08:32  Vitals shown include unfiled device data.  Last Pain:  Vitals:   10/20/23 0614  TempSrc: Oral  PainSc: 0-No pain      Patients Stated Pain Goal: 5 (10/20/23 9528)  Complications: No notable events documented.

## 2023-10-20 NOTE — Op Note (Signed)
 Operative Note  Preoperative diagnosis:  1.  Clinically localized adenocarcinoma of the prostate (Grade group 3 disease PSA 15)  Postoperative diagnosis: 1.  Clinically localized adenocarcinoma of the prostate  Procedure(s): 1. Placement of fiducial markers into prostate 2. Insertion of SpaceOAR hydrogel   Surgeon: Aimee Alf  Assistants:  None  Anesthesia:  General  Complications:  None  EBL:  Minimal  Specimens: 1. None  Drains/Catheters: 1.  None  Indication:  Kevin [name] is a 78 y.o. male with clinically localized prostate cancer. After discussing management options for treatment, he elected to proceed with radiotherapy. He presents today for the above procedures. The potential risks, complications, alternative options, and expected recovery course have been discussed in detail with the patient and he has provided informed consent to proceed.  Description of procedure: The patient was administered preoperative antibiotics, placed in the dorsal lithotomy position, and prepped and draped in the usual sterile fashion. Next, transrectal ultrasonography was utilized to visualize the prostate. Three gold fiducial markers were then placed into the prostate via transperineal needles under ultrasound guidance at the right apex, right base, and left mid gland under direct ultrasound guidance. A site in the midline was then selected on the perineum for placement of an 18 g needle with saline. The needle was advanced above the rectum and below Denonvillier's fascia to the mid gland and confirmed to be in the midline on transverse imaging. One cc of saline was injected confirming appropriate expansion of this space. A total of 5 cc of saline was then injected to open the space further bilaterally. The saline syringe was then removed and the SpaceOAR hydrogel was injected with good distribution bilaterally. He tolerated the procedure well and without complications. He was given a voiding  trial prior to discharge from the PACU.  Aimee Alf MD [facility]

## 2023-10-20 NOTE — Discharge Instructions (Addendum)
 For several days the patient:   should increase his fluid intake and limit strenuous activity for 1-2 weeks he might have mild discomfort at the base of his penis or in his rectum. he might have blood in his urine or blood in his bowel movements. Normal to experience the sensation of rectal fullness  Avoid sexual activity for 1 week  Bathing: Patient is ok to bath normally  Diet: Ok to resume normal diet Drink a lot of water   Medications: Avoid blood thinners for 48 hours after surgery (aspirin , ibuprofen, naproxen)   For 2-3 months he might have blood in his ejaculate (semen).   Call the office immedicately:  for blood clots in the urine or bowel movements,  difficulty urinating,  inability to urinate,  urinary retention,  painful or frequent urination,  fever, chills,  nausea, vomiting, other illness.   Please continue to hold your plavix  for an additional 3 days     [facility]:  [phone]

## 2023-10-20 NOTE — ED Triage Notes (Signed)
 Pt had prostrate procedure today and since then does not feel right. He states he feels week and sob at times. He also c/o lower back pain.

## 2023-10-20 NOTE — Anesthesia Postprocedure Evaluation (Signed)
 Anesthesia Post Note  Patient: Kevin [name]  Procedure(s) Performed: INSERTION, GOLD SEEDS (Prostate) INJECTION, HYDROGEL SPACER (Prostate)     Patient location during evaluation: PACU Anesthesia Type: MAC Level of consciousness: awake and alert Pain management: pain level controlled Vital Signs Assessment: post-procedure vital signs reviewed and stable Respiratory status: spontaneous breathing, nonlabored ventilation and respiratory function stable Cardiovascular status: stable and blood pressure returned to baseline Postop Assessment: no apparent nausea or vomiting Anesthetic complications: no  No notable events documented.  Last Vitals:  Vitals:   10/20/23 0614 10/20/23 0825  BP: 132/75   Pulse: 64   Resp: 18   Temp: 36.4 C 36.6 C  SpO2: 99%     Last Pain:  Vitals:   10/20/23 0825  TempSrc:   PainSc: 0-No pain                 Rennie Hack,W. EDMOND

## 2023-10-20 NOTE — ED Provider Notes (Signed)
 [place] EMERGENCY DEPARTMENT AT [facility] Provider Note  CSN: [id number] Arrival date & time: 10/20/23 1541  Chief Complaint(s) Weakness  HPI Kevin [name] is a 78 y.o. male with prostate cancer, coronary artery disease, CHF, CKD, ventral hernia presenting with episode of weakness.  Today patient had a radioactive seed insertion, says that went well, had been n.p.o. after midnight, has eaten since then.  After getting home, felt weak, had some chest pressure, mild shortness of breath.  Has voided and had bowel movement since then, no urinary symptoms, no blood in stool.  Reports has had some mild diarrhea lately.  Reports felt nauseous today, but no vomiting.  No fevers or chills.  No abdominal pain.  Currently feels better, but still feels weak and mildly nauseous.   Past Medical History Past Medical History:  Diagnosis Date   Aortic atherosclerosis (HCC) 09/15/2023   Benign localized prostatic hyperplasia with lower urinary tract symptoms (LUTS)    CAD (coronary artery disease) 2001   cardiologist-- dr Katheryne Pane;    11/ 2001  acute anferior MI  s/p cath w/ PCI ostial diag & PTCA & BMS to pLAD;   Cath 12/ 2004 stent to mCFx;  cath 02/ 2009 PTCA BMS to dCFx & PTCA/ [place]ion angio DES to mCFx; cath 07/2013 TO nondominant RCA/ patent LAD& CFx stents;  cath 10/ 2016 DES to mLAD for ISR;  NUC 11-12-2016 LR w/ normal perfusion, nuclear ef 59%   Carpal tunnel syndrome, bilateral    Chronic diastolic CHF (congestive heart failure) (HCC) 07/2013   Chronic rhinitis    Chronic venous stasis    history s/p left GSV ablation yrs ago   CKD (chronic kidney disease), stage III (HCC)    Diverticulosis of colon    Edema of both lower extremities    Environmental and seasonal allergies    History of acute anterior wall MI 2001   History of malignant melanoma 02/2010   previously followed by oncology-- dr Clydell Darnel (released 2017)  dx 10/ 2011;  02-18-2010  s/p WLE left foot ;   Stage IA,  no SLN  bx,  no other treatment and no recurrence   History of non-ST elevation myocardial infarction (NSTEMI) 02/13/2015   History of small bowel obstruction 09/20/2021   s/p  surgical intervention   HTN (hypertension)    Hyperlipidemia, mixed    Hypothyroidism    Incomplete emptying of bladder    Malignant neoplasm prostate (HCC) 08/2023   primary urology--- dr showalter/  radiation oncology--- dr Lorri Rota;   dx 04/ 2025,  Gleason 4+3,  PSA 15.2   OA (osteoarthritis)    knees   OSA (obstructive sleep apnea)    10-15-2023  pt stated does use CPAP since 2023 after losing 140 lb   Psoriasis    S/p bare metal coronary artery stent    11/ 2001  BMS to pLAD;     02/ 2009 BMS to distal dominant CFx   S/P drug eluting coronary stent placement    02/ 2009  DES to mCFx;  DES to mLAD   Type 2 diabetes mellitus (HCC)    followed by pcp   (10-15-2023  pt stated does check blood sugars at home)   Ventral hernia without obstruction or gangrene    followed by general surgeon--- dr l. bridges   Weak urine stream    Patient Active Problem List   Diagnosis Date Noted   Aortic atherosclerosis (HCC) 09/15/2023   Malignant neoplasm of  prostate (HCC) 09/15/2023   AKI (acute kidney injury) (HCC) 06/30/2023   Enteritis due to Norovirus 06/30/2023   Diastasis recti 02/13/2022   Volvulus of jejunum (HCC)    Personal history of gastric banding    Gouty arthritis of both feet 09/18/2021   Obesity (BMI 30-39.9) 09/11/2021   Acute renal failure superimposed on stage 3a chronic kidney disease (HCC) - baseline SCr (1.2-1.4) 09/11/2021   Type 2 diabetes mellitus with hyperglycemia (HCC) 09/11/2021   Small bowel obstruction (HCC) 09/10/2021   Carpal tunnel syndrome of left wrist 10/12/2020   Carpal tunnel syndrome of right wrist 10/11/2020   Bilateral shoulder pain 06/20/2020   Anaphylactic shock due to adverse food reaction 08/22/2019   Rhinitis, chronic 02/19/2018   Bilateral hearing loss 12/25/2017    Eustachian tube dysfunction 12/25/2017   Right chronic serous otitis media 12/25/2017   Dyslipidemia 02/22/2015   Acute coronary syndrome (HCC) 02/14/2015   NSTEMI (non-ST elevated myocardial infarction) (HCC)    Chest pain 02/13/2015   OSA on CPAP 01/20/2014   Accelerating angina (HCC) 07/14/2013   Edema, lower extremity, bil 07/14/2013   Coronary artery disease, hx LAD and LCX stenting in 2001 & 2004, last cath 2009 with patent stents. 04/07/2013   Essential hypertension 04/07/2013   Mixed hyperlipidemia 04/07/2013   Diabetes (HCC) 04/07/2013   Morbid obesity (HCC) 04/07/2013   Melanoma of foot (HCC) 06/02/2011   Microscopic colitis 10/24/2010   GI bleed 10/01/2010   Acute infectious diarrhea - due to norovirus 10/01/2010   Home Medication(s) Prior to Admission medications   Medication Sig Start Date End Date Taking? Authorizing Provider  Ascorbic Acid (VITAMIN C) 1000 MG tablet Take 1,000 mg by mouth daily.   Yes [provider]  aspirin  81 MG tablet Take 81 mg by mouth daily.   Yes [provider]  b complex vitamins capsule Take 1 capsule by mouth daily.   Yes [provider]  Cholecalciferol (VITAMIN D3) 25 MCG CAPS Take 1 capsule by mouth every other day.   Yes [provider]  clopidogrel  (PLAVIX ) 75 MG tablet TAKE ONE TABLET BY MOUTH ONCE DAILY. 04/23/23  Yes Avanell Leigh, MD  Coenzyme Q10 (COQ-10) 100 MG CAPS Take 1 capsule by mouth daily.   Yes [provider]  diphenhydrAMINE  (BENADRYL ) 25 MG tablet Take 25 mg by mouth every 6 (six) hours as needed for itching or allergies.   Yes [provider]  Flax Oil-Fish Oil-Borage Oil (FISH OIL-FLAX OIL-BORAGE OIL) CAPS Take 3,000 mg by mouth every morning.   Yes [provider]  fluticasone  (FLONASE ) 50 MCG/ACT nasal spray Place 2 sprays into both nostrils daily.   Yes [provider]  furosemide  (LASIX ) 40 MG tablet Take 1 tablet (40 mg total) by mouth  daily. Patient taking differently: Take 40 mg by mouth daily. 07/05/23  Yes Justina Oman, MD  JANUVIA 100 MG tablet Take 100 mg by mouth daily. 03/12/22  Yes [provider]  levothyroxine  (SYNTHROID ) 50 MCG tablet Take 50 mcg by mouth daily before breakfast.   Yes [provider]  metoprolol  (TOPROL -XL) 100 MG 24 hr tablet Take 100 mg by mouth daily.   Yes [provider]  Misc Natural Products (TURMERIC CURCUMIN) CAPS Take 3 capsules by mouth every morning. 2400 mg/ 3000 mg = total   Yes [provider]  MOUNJARO 2.5 MG/0.5ML Pen Inject 2.5 mg into the skin once a week. MONDAY'S 08/11/23  Yes [provider]  Naphazoline-Pheniramine (  EYE ALLERGY  RELIEF OP) Apply to eye as needed.   Yes [provider]  niacin  (NIASPAN ) 1000 MG CR tablet TAKE 1 TABLET BY MOUTH AT BEDTIME. Patient taking differently: Take 1,000 mg by mouth at bedtime. 01/30/23  Yes Avanell Leigh, MD  Omega-3 Fatty Acids (FISH OIL PO) Take 1 capsule by mouth daily.   Yes [provider]  oxyCODONE  (ROXICODONE ) 5 MG immediate release tablet Take 1 tablet (5 mg total) by mouth every 6 (six) hours as needed for up to 2 doses for severe pain (pain score 7-10). 10/20/23  Yes Showalter, Emilio Harder, MD  pravastatin  (PRAVACHOL ) 40 MG tablet Take 1 tablet (40 mg total) by mouth every evening. Patient taking differently: Take 40 mg by mouth every evening. 04/18/19  Yes Avanell Leigh, MD  tamsulosin  (FLOMAX ) 0.4 MG CAPS capsule Take 0.4 mg by mouth every evening. 07/17/20  Yes [provider]  valsartan -hydrochlorothiazide  (DIOVAN -HCT) 160-12.5 MG tablet Take 1 tablet by mouth daily. 09/07/23  Yes [provider]                                                                                                                                    Past Surgical History Past Surgical History:  Procedure Laterality Date   APPENDECTOMY     1950s   CARDIAC CATHETERIZATION  N/A 02/14/2015   Procedure: Left Heart Cath and Coronary Angiography;  Surgeon: Lucendia Rusk, MD;  Location: [facility] INVASIVE CV LAB;  Service: Cardiovascular;  Laterality: N/A;   CARDIAC CATHETERIZATION N/A 02/14/2015   Procedure: Coronary Stent Intervention;  Surgeon: Lucendia Rusk, MD;  Location: [facility] INVASIVE CV LAB;  Service: Cardiovascular;  Laterality: N/A;   CATARACT EXTRACTION W/ INTRAOCULAR LENS IMPLANT Bilateral 2007   COLONOSCOPY N/A 10/09/2010   normal rectum, long redundant colon, pancolonic diverticula, 6mm cecal polyp, diminutive polyp at the appendiceal orifice ablated with tip of hot snare (Biopsies suggestive of microscopic colitis; polyp benign; small bowel ok)   CORONARY ANGIOPLASTY WITH STENT PLACEMENT  06/11/2007   @[facility]   by dr Berry Bristol;   PTCA & BMS distal CX, PTCA & balloon angioplasty in-stent restenotic & DES to mid CX   CORONARY ANGIOPLASTY WITH STENT PLACEMENT  03/2000   @ [facility]  by dr b. Grandville Lax;  PCI to ostial diagnal ;  PTCA and BMS to pLAD   CORONARY ANGIOPLASTY WITH STENT PLACEMENT  04/2003   @[facility]  by dr Nikki Barters;  stent to midCFx   ESOPHAGOGASTRODUODENOSCOPY N/A 10/09/2010   Normal esophagus, lap band present, 2nd portion duodenum with erosions without frank ulcer   GASTRIC BANDING PORT REVISION N/A 09/20/2021   Procedure: GASTRIC BAND REMOVAL;  Surgeon: Awilda Bogus, MD;  Location: [facility] ORS;  Service: General;  Laterality: N/A;   KNEE ARTHROSCOPY Bilateral    left 10/ 2004;   right 2006   LAPAROSCOPIC GASTRIC BANDING  2009   LAPAROTOMY N/A 09/20/2021  Procedure: EXPLORATORY LAPAROTOMY,  LYSIS OF ADHESIONS, REDUCTION OF VOLVULUS;  Surgeon: Awilda Bogus, MD;  Location: [facility] ORS;  Service: General;  Laterality: N/A;   REMOVAL GASTRIC LAP BAND   LEFT HEART CATHETERIZATION WITH CORONARY ANGIOGRAM N/A 07/15/2013   Procedure: LEFT HEART CATHETERIZATION WITH CORONARY ANGIOGRAM;  Surgeon: Mickiel Albany, MD;  Location: [facility] CATH LAB;  Service: Cardiovascular;   Laterality: N/A;   MELANOMA EXCISION Left 02/18/2010   @[facility]  by dr b. Hildy Lowers;   WLE MELANOMA OF LEFT FOOT   NASAL SEPTUM SURGERY     1990s   NASAL/SINUS ENDOSCOPY  09/18/2006   @[facility]  by dr d. shoemaker;   REVISION SEPTOPLASTY/ RIGHT INFERIOR TURBINATE REDUCTION/ LEFT TOTAL ETHMOIDECTOMY   PENILE PROSTHESIS IMPLANT  1996   SHOULDER SURGERY Left 2004   for staph infection   TONSILLECTOMY AND ADENOIDECTOMY     child   TYMPANOSTOMY TUBE PLACEMENT Bilateral    1970s   Family History Family History  Problem Relation Age of Onset   Coronary artery disease Mother    Diabetes Father    Liver cancer Brother 18   Celiac disease Other 77       brother's daughter   Allergic rhinitis Neg Hx    Angioedema Neg Hx    Asthma Neg Hx    Atopy Neg Hx    Eczema Neg Hx    Immunodeficiency Neg Hx    Urticaria Neg Hx     Social History Social History   Tobacco Use   Smoking status: Former    Current packs/day: 3.00    Types: Cigarettes    Passive exposure: Past   Smokeless tobacco: Former    Types: Snuff, Chew    Quit date: 1991   Tobacco comments:    10-15-2023  pt stated quit smoking 1991,  started age 61 (73)  smoked for 25 yrs  Vaping Use   Vaping status: Never Used  Substance Use Topics   Alcohol use: Not Currently    Comment: 3-4 times/year   Drug use: Never   Allergies Irbesartan, Prednisone , Ramipril, Shellfish allergy , Tradjenta [linagliptin], Cephalexin, Clindamycin hcl, Codeine, Contrast media [iodinated contrast media], Fenofibrate, Glycotrol [support-500], Indomethacin, Minocycline hcl, Morphine and codeine, Sulfa drugs cross reactors, and Tetracyclines & related  Review of Systems Review of Systems  All other systems reviewed and are negative.   Physical Exam Vital Signs  I have reviewed the triage vital signs BP 129/82   Pulse 66   Temp 97.7 F (36.5 C) (Oral)   Resp 17   Ht 5' 9.5 (1.765 m)   Wt 101.2 kg   SpO2 100%   BMI 32.47 kg/m  Physical  Exam Vitals and nursing note reviewed.  Constitutional:      General: He is not in acute distress.    Appearance: Normal appearance.  HENT:     Mouth/Throat:     Mouth: Mucous membranes are moist.   Eyes:     Conjunctiva/sclera: Conjunctivae normal.    Cardiovascular:     Rate and Rhythm: Normal rate and regular rhythm.  Pulmonary:     Effort: Pulmonary effort is normal. No respiratory distress.     Breath sounds: Normal breath sounds.  Abdominal:     General: Abdomen is flat.     Palpations: Abdomen is soft.     Tenderness: There is no abdominal tenderness.   Musculoskeletal:     Right lower leg: Edema present.     Left lower leg: Edema present.  Skin:    General: Skin is warm and dry.     Capillary Refill: Capillary refill takes less than 2 seconds.   Neurological:     Mental Status: He is alert and oriented to person, place, and time. Mental status is at baseline.   Psychiatric:        Mood and Affect: Mood normal.        Behavior: Behavior normal.     ED Results and Treatments Labs (all labs ordered are listed, but only abnormal results are displayed) Labs Reviewed  COMPREHENSIVE METABOLIC PANEL WITH GFR - Abnormal; Notable for the following components:      Result Value   BUN 56 (*)    Creatinine, Ser 1.94 (*)    AST 13 (*)    Alkaline Phosphatase 28 (*)    GFR, Estimated 35 (*)    All other components within normal limits  CBC WITH DIFFERENTIAL/PLATELET - Abnormal; Notable for the following components:   RBC 3.36 (*)    Hemoglobin 10.8 (*)    HCT 32.5 (*)    Platelets 108 (*)    All other components within normal limits  URINALYSIS, W/ REFLEX TO CULTURE (INFECTION SUSPECTED) - Abnormal; Notable for the following components:   Color, Urine STRAW (*)    Glucose, UA 50 (*)    All other components within normal limits  TROPONIN I (HIGH SENSITIVITY)  TROPONIN I (HIGH SENSITIVITY)                                                                                                                           Radiology DG Chest Portable 1 View Result Date: 10/20/2023 CLINICAL DATA:  Shortness of breath. EXAM: PORTABLE CHEST 1 VIEW COMPARISON:  PET-CT dated 09/03/2023. Chest radiograph dated 03/05/2023. FINDINGS: The heart size and mediastinal contours are unchanged. Minimal bibasilar scarring/atelectasis. No focal consolidation, pleural effusion, or pneumothorax. No acute osseous abnormality. IMPRESSION: Minimal bibasilar scarring/atelectasis. Otherwise, no acute cardiopulmonary findings. Electronically Signed   By: Mannie Seek M.D.   On: 10/20/2023 17:31    Pertinent labs & imaging results that were available during my care of the patient were reviewed by me and considered in my medical decision making (see MDM for details).  Medications Ordered in ED Medications  sodium chloride  0.9 % bolus 1,000 mL (0 mLs Intravenous Stopped 10/20/23 2011)  Procedures Procedures  (including critical care time)  Medical Decision Making / ED Course   MDM:  78 year old presenting to the emergency department with weakness.  Patient overall well-appearing, physical examination with no focal finding other than mild lower extremity edema, patient reports is chronic.  Otherwise without signs of volume overload no abdominal tenderness.  Concern for possible dehydration, had i-STAT today prior to surgery which showed elevated creatinine, will repeat this.  Seems more dehydrated than volume overloaded.  Differential also includes medication effect from surgery, ACS, UTI, metabolic abnormality.  Vital signs are reassuring.  Will reassess.  Clinical Course as of 10/20/23 2132  Tue Oct 20, 2023  2130 Workup reassuring. Patient feels better after fluids, and creatinine improved from earlier today. Will recommend he hold his lasix  for next  few days.  Will discharge patient to home. All questions answered. Patient comfortable with plan of discharge. Return precautions discussed with patient and specified on the after visit summary.  [WS]    Clinical Course User Index [WS] Mordecai Applebaum, MD     Additional history obtained:  -External records from outside source obtained and reviewed including: Chart review including previous notes, labs, imaging, consultation notes including prior notes including op note today   Lab Tests: -I ordered, reviewed, and interpreted labs.   The pertinent results include:   Labs Reviewed  COMPREHENSIVE METABOLIC PANEL WITH GFR - Abnormal; Notable for the following components:      Result Value   BUN 56 (*)    Creatinine, Ser 1.94 (*)    AST 13 (*)    Alkaline Phosphatase 28 (*)    GFR, Estimated 35 (*)    All other components within normal limits  CBC WITH DIFFERENTIAL/PLATELET - Abnormal; Notable for the following components:   RBC 3.36 (*)    Hemoglobin 10.8 (*)    HCT 32.5 (*)    Platelets 108 (*)    All other components within normal limits  URINALYSIS, W/ REFLEX TO CULTURE (INFECTION SUSPECTED) - Abnormal; Notable for the following components:   Color, Urine STRAW (*)    Glucose, UA 50 (*)    All other components within normal limits  TROPONIN I (HIGH SENSITIVITY)  TROPONIN I (HIGH SENSITIVITY)    Notable for normal troponin. Improving aki on CKD  EKG  See chart    Imaging Studies ordered: I ordered imaging studies including CXR On my interpretation imaging demonstrates no acute process I independently visualized and interpreted imaging. I agree with the radiologist interpretation   Medicines ordered and prescription drug management: Meds ordered this encounter  Medications   sodium chloride  0.9 % bolus 1,000 mL    -I have reviewed the patients home medicines and have made adjustments as needed   Social Determinants of Health:  Diagnosis or treatment  significantly limited by social determinants of health: obesity   Reevaluation: After the interventions noted above, I reevaluated the patient and found that their symptoms have improved  Co morbidities that complicate the patient evaluation  Past Medical History:  Diagnosis Date   Aortic atherosclerosis (HCC) 09/15/2023   Benign localized prostatic hyperplasia with lower urinary tract symptoms (LUTS)    CAD (coronary artery disease) 2001   cardiologist-- dr Katheryne Pane;    11/ 2001  acute anferior MI  s/p cath w/ PCI ostial diag & PTCA & BMS to pLAD;   Cath 12/ 2004 stent to mCFx;  cath 02/ 2009 PTCA BMS to dCFx & PTCA/ [place]ion angio DES to mCFx;  cath 07/2013 TO nondominant RCA/ patent LAD& CFx stents;  cath 10/ 2016 DES to mLAD for ISR;  NUC 11-12-2016 LR w/ normal perfusion, nuclear ef 59%   Carpal tunnel syndrome, bilateral    Chronic diastolic CHF (congestive heart failure) (HCC) 07/2013   Chronic rhinitis    Chronic venous stasis    history s/p left GSV ablation yrs ago   CKD (chronic kidney disease), stage III (HCC)    Diverticulosis of colon    Edema of both lower extremities    Environmental and seasonal allergies    History of acute anterior wall MI 2001   History of malignant melanoma 02/2010   previously followed by oncology-- dr Clydell Darnel (released 2017)  dx 10/ 2011;  02-18-2010  s/p WLE left foot ;   Stage IA,  no SLN bx,  no other treatment and no recurrence   History of non-ST elevation myocardial infarction (NSTEMI) 02/13/2015   History of small bowel obstruction 09/20/2021   s/p  surgical intervention   HTN (hypertension)    Hyperlipidemia, mixed    Hypothyroidism    Incomplete emptying of bladder    Malignant neoplasm prostate (HCC) 08/2023   primary urology--- dr showalter/  radiation oncology--- dr Lorri Rota;   dx 04/ 2025,  Gleason 4+3,  PSA 15.2   OA (osteoarthritis)    knees   OSA (obstructive sleep apnea)    10-15-2023  pt stated does use CPAP since 2023 after  losing 140 lb   Psoriasis    S/p bare metal coronary artery stent    11/ 2001  BMS to pLAD;     02/ 2009 BMS to distal dominant CFx   S/P drug eluting coronary stent placement    02/ 2009  DES to mCFx;  DES to mLAD   Type 2 diabetes mellitus (HCC)    followed by pcp   (10-15-2023  pt stated does check blood sugars at home)   Ventral hernia without obstruction or gangrene    followed by general surgeon--- dr l. bridges   Weak urine stream       Dispostion: Disposition decision including need for hospitalization was considered, and patient discharged from emergency department.    Final Clinical Impression(s) / ED Diagnoses Final diagnoses:  Dehydration     This chart was dictated using voice recognition software.  Despite best efforts to proofread,  errors can occur which can change the documentation meaning.    Mordecai Applebaum, MD 10/20/23 2132

## 2023-10-20 NOTE — Anesthesia Procedure Notes (Signed)
 Procedure Name: MAC Date/Time: 10/20/2023 8:32 AM  Performed by: Hebert Littler, CRNAPre-anesthesia Checklist: Patient identified, Emergency Drugs available, Suction available, Patient being monitored and Timeout performed

## 2023-10-21 ENCOUNTER — Encounter (HOSPITAL_COMMUNITY): Payer: Self-pay | Admitting: Urology

## 2023-10-21 ENCOUNTER — Telehealth: Payer: Self-pay | Admitting: *Deleted

## 2023-10-21 NOTE — Progress Notes (Signed)
  Radiation Oncology         [phone] ________________________________  Name: Kevin [name] MRN: [id number]  Date: 10/22/2023  DOB: 1945-05-14  SIMULATION AND TREATMENT PLANNING NOTE    ICD-10-CM   1. Malignant neoplasm of prostate (HCC)  C61       DIAGNOSIS:    78 y.o. gentleman with oligometastatic adenocarcinoma of the prostate with Gleason Score of 4+3, PSA of 15.2 and a solitary nodal metastasis in a left external iliac lymph node.   NARRATIVE:  The patient was brought to the CT Simulation planning suite.  Identity was confirmed.  All relevant records and images related to the planned course of therapy were reviewed.  The patient freely provided informed written consent to proceed with treatment after reviewing the details related to the planned course of therapy. The consent form was witnessed and verified by the simulation staff.  Then, the patient was set-up in a stable reproducible supine position for radiation therapy.  A vacuum lock pillow device was custom fabricated to position his legs in a reproducible immobilized position.  Then, I performed a urethrogram under sterile conditions to identify the prostatic apex.  CT images were obtained.  Surface markings were placed.  The CT images were loaded into the planning software.  Then the prostate target and avoidance structures including the rectum, bladder, bowel and hips were contoured.  Treatment planning then occurred.  The radiation prescription was entered and confirmed.  A total of one complex treatment devices was fabricated. I have requested : Intensity Modulated Radiotherapy (IMRT) is medically necessary for this case for the following reason:  Rectal sparing.Aaron Aas  PLAN:  The prostate and solitary PET positive pelvic node will be treated to 70 Gy in 28 fractions.  ________________________________  Trilby Fujisawa Lorri Rota, M.D.

## 2023-10-21 NOTE — Telephone Encounter (Signed)
 CALLED PATIENT TO REMIND OF SIM FOR 10-22-23 - ARRIVAL TIME- 1:15 PM @ [facility], INFORMED PATIENT TO ARRIVE WITH A FULL BLADDER, MRI CANCELLED DUE TO PATIENT HAVING A PENILE IMPLANT, VM FULL UNABLE TO LVM, WILL CALL LATER

## 2023-10-21 NOTE — Telephone Encounter (Signed)
 CALLED PATIENT TO REMIND OF SIM FOR 10-22-23- ARRIVAL TIME- 1:15 PM @ [facility], INFORMED PATIENT TO ARRIVE WITH  A FULL BLADDER, I ALSO INFORMED PATIENT THAT MRI HAS BEEN CANCELLED DUE TO PENILE IMPLANT, SPOKE WITH PATIENT AND HE IS AWARE OF THIS APPT. AND THE INSTRUCTIONS AND THE CANCELLATION FOR THE MRI

## 2023-10-22 ENCOUNTER — Ambulatory Visit
Admission: RE | Admit: 2023-10-22 | Discharge: 2023-10-22 | Disposition: A | Discharge status: Home / Self Care | Source: Ambulatory Visit | Attending: Urology | Admitting: Urology

## 2023-10-22 ENCOUNTER — Ambulatory Visit (HOSPITAL_COMMUNITY): Admission: RE | Admit: 2023-10-22 | Source: Ambulatory Visit

## 2023-10-22 DIAGNOSIS — Z191 Hormone sensitive malignancy status: Secondary | ICD-10-CM | POA: Diagnosis not present

## 2023-10-22 DIAGNOSIS — C61 Malignant neoplasm of prostate: Secondary | ICD-10-CM | POA: Diagnosis not present

## 2023-10-22 DIAGNOSIS — Z510 Encounter for antineoplastic radiation therapy: Secondary | ICD-10-CM | POA: Diagnosis not present

## 2023-10-23 DIAGNOSIS — R3914 Feeling of incomplete bladder emptying: Secondary | ICD-10-CM | POA: Diagnosis not present

## 2023-10-23 DIAGNOSIS — N401 Enlarged prostate with lower urinary tract symptoms: Secondary | ICD-10-CM | POA: Diagnosis not present

## 2023-10-24 NOTE — Addendum Note (Signed)
 Addended by: PATRCIA COUGH on: 10/24/2023 03:43 PM   Modules accepted: Orders

## 2023-10-27 DIAGNOSIS — E860 Dehydration: Secondary | ICD-10-CM | POA: Diagnosis not present

## 2023-10-27 DIAGNOSIS — E6609 Other obesity due to excess calories: Secondary | ICD-10-CM | POA: Diagnosis not present

## 2023-10-27 DIAGNOSIS — Z6836 Body mass index (BMI) 36.0-36.9, adult: Secondary | ICD-10-CM | POA: Diagnosis not present

## 2023-10-29 DIAGNOSIS — E6609 Other obesity due to excess calories: Secondary | ICD-10-CM | POA: Diagnosis not present

## 2023-10-29 DIAGNOSIS — E860 Dehydration: Secondary | ICD-10-CM | POA: Diagnosis not present

## 2023-10-29 DIAGNOSIS — Z6836 Body mass index (BMI) 36.0-36.9, adult: Secondary | ICD-10-CM | POA: Diagnosis not present

## 2023-11-02 ENCOUNTER — Ambulatory Visit
Admission: RE | Admit: 2023-11-02 | Discharge: 2023-11-02 | Disposition: A | Discharge status: Home / Self Care | Source: Ambulatory Visit | Attending: Radiation Oncology | Admitting: Radiation Oncology

## 2023-11-02 ENCOUNTER — Other Ambulatory Visit: Payer: Self-pay

## 2023-11-02 DIAGNOSIS — N400 Enlarged prostate without lower urinary tract symptoms: Secondary | ICD-10-CM | POA: Diagnosis present

## 2023-11-02 DIAGNOSIS — M545 Low back pain, unspecified: Secondary | ICD-10-CM | POA: Diagnosis present

## 2023-11-02 DIAGNOSIS — I5032 Chronic diastolic (congestive) heart failure: Secondary | ICD-10-CM | POA: Diagnosis present

## 2023-11-02 DIAGNOSIS — E871 Hypo-osmolality and hyponatremia: Secondary | ICD-10-CM | POA: Diagnosis present

## 2023-11-02 DIAGNOSIS — Z833 Family history of diabetes mellitus: Secondary | ICD-10-CM | POA: Diagnosis not present

## 2023-11-02 DIAGNOSIS — Z7985 Long-term (current) use of injectable non-insulin antidiabetic drugs: Secondary | ICD-10-CM | POA: Diagnosis not present

## 2023-11-02 DIAGNOSIS — N1831 Chronic kidney disease, stage 3a: Secondary | ICD-10-CM | POA: Diagnosis present

## 2023-11-02 DIAGNOSIS — D696 Thrombocytopenia, unspecified: Secondary | ICD-10-CM | POA: Diagnosis present

## 2023-11-02 DIAGNOSIS — Z510 Encounter for antineoplastic radiation therapy: Secondary | ICD-10-CM | POA: Diagnosis not present

## 2023-11-02 DIAGNOSIS — Z191 Hormone sensitive malignancy status: Secondary | ICD-10-CM | POA: Diagnosis not present

## 2023-11-02 DIAGNOSIS — C61 Malignant neoplasm of prostate: Secondary | ICD-10-CM | POA: Diagnosis not present

## 2023-11-02 DIAGNOSIS — K8020 Calculus of gallbladder without cholecystitis without obstruction: Secondary | ICD-10-CM | POA: Diagnosis not present

## 2023-11-02 DIAGNOSIS — E1159 Type 2 diabetes mellitus with other circulatory complications: Secondary | ICD-10-CM | POA: Diagnosis not present

## 2023-11-02 DIAGNOSIS — I130 Hypertensive heart and chronic kidney disease with heart failure and stage 1 through stage 4 chronic kidney disease, or unspecified chronic kidney disease: Secondary | ICD-10-CM | POA: Diagnosis present

## 2023-11-02 DIAGNOSIS — E860 Dehydration: Secondary | ICD-10-CM | POA: Diagnosis present

## 2023-11-02 DIAGNOSIS — E039 Hypothyroidism, unspecified: Secondary | ICD-10-CM | POA: Diagnosis present

## 2023-11-02 DIAGNOSIS — K5730 Diverticulosis of large intestine without perforation or abscess without bleeding: Secondary | ICD-10-CM | POA: Diagnosis not present

## 2023-11-02 DIAGNOSIS — I2510 Atherosclerotic heart disease of native coronary artery without angina pectoris: Secondary | ICD-10-CM | POA: Diagnosis present

## 2023-11-02 DIAGNOSIS — Z8546 Personal history of malignant neoplasm of prostate: Secondary | ICD-10-CM | POA: Diagnosis not present

## 2023-11-02 DIAGNOSIS — R079 Chest pain, unspecified: Secondary | ICD-10-CM | POA: Diagnosis not present

## 2023-11-02 DIAGNOSIS — I252 Old myocardial infarction: Secondary | ICD-10-CM | POA: Diagnosis not present

## 2023-11-02 DIAGNOSIS — R109 Unspecified abdominal pain: Secondary | ICD-10-CM | POA: Diagnosis not present

## 2023-11-02 DIAGNOSIS — E1122 Type 2 diabetes mellitus with diabetic chronic kidney disease: Secondary | ICD-10-CM | POA: Diagnosis present

## 2023-11-02 DIAGNOSIS — Z79890 Hormone replacement therapy (postmenopausal): Secondary | ICD-10-CM | POA: Diagnosis not present

## 2023-11-02 DIAGNOSIS — I700 Atherosclerosis of aorta: Secondary | ICD-10-CM | POA: Diagnosis present

## 2023-11-02 DIAGNOSIS — Z7902 Long term (current) use of antithrombotics/antiplatelets: Secondary | ICD-10-CM | POA: Diagnosis not present

## 2023-11-02 DIAGNOSIS — Z8249 Family history of ischemic heart disease and other diseases of the circulatory system: Secondary | ICD-10-CM | POA: Diagnosis not present

## 2023-11-02 DIAGNOSIS — D631 Anemia in chronic kidney disease: Secondary | ICD-10-CM | POA: Diagnosis present

## 2023-11-02 DIAGNOSIS — I10 Essential (primary) hypertension: Secondary | ICD-10-CM | POA: Diagnosis not present

## 2023-11-02 DIAGNOSIS — Z7984 Long term (current) use of oral hypoglycemic drugs: Secondary | ICD-10-CM | POA: Diagnosis not present

## 2023-11-02 DIAGNOSIS — Z7982 Long term (current) use of aspirin: Secondary | ICD-10-CM | POA: Diagnosis not present

## 2023-11-02 DIAGNOSIS — E782 Mixed hyperlipidemia: Secondary | ICD-10-CM | POA: Diagnosis present

## 2023-11-02 DIAGNOSIS — Z955 Presence of coronary angioplasty implant and graft: Secondary | ICD-10-CM | POA: Diagnosis not present

## 2023-11-02 DIAGNOSIS — K439 Ventral hernia without obstruction or gangrene: Secondary | ICD-10-CM | POA: Diagnosis not present

## 2023-11-02 LAB — RAD ONC ARIA SESSION SUMMARY
Course Elapsed Days: 0
Plan Fractions Treated to Date: 1
Plan Prescribed Dose Per Fraction: 2.5 Gy
Plan Total Fractions Prescribed: 28
Plan Total Prescribed Dose: 70 Gy
Reference Point Dosage Given to Date: 2.5 Gy
Reference Point Session Dosage Given: 2.5 Gy
Session Number: 1

## 2023-11-03 ENCOUNTER — Emergency Department (HOSPITAL_COMMUNITY)

## 2023-11-03 ENCOUNTER — Other Ambulatory Visit: Payer: Self-pay

## 2023-11-03 ENCOUNTER — Ambulatory Visit
Admission: RE | Admit: 2023-11-03 | Discharge: 2023-11-03 | Disposition: A | Discharge status: Home / Self Care | Source: Ambulatory Visit | Attending: Radiation Oncology | Admitting: Radiation Oncology

## 2023-11-03 ENCOUNTER — Inpatient Hospital Stay (HOSPITAL_COMMUNITY)
Admission: EM | Admit: 2023-11-03 | Discharge: 2023-11-05 | DRG: 641 | Disposition: A | Discharge status: Nursing Home (Includes ICF) | Source: Skilled Nursing Facility | Attending: Family Medicine | Admitting: Family Medicine

## 2023-11-03 DIAGNOSIS — K439 Ventral hernia without obstruction or gangrene: Secondary | ICD-10-CM | POA: Diagnosis not present

## 2023-11-03 DIAGNOSIS — M545 Low back pain, unspecified: Secondary | ICD-10-CM | POA: Diagnosis present

## 2023-11-03 DIAGNOSIS — Z87891 Personal history of nicotine dependence: Secondary | ICD-10-CM

## 2023-11-03 DIAGNOSIS — Z91013 Allergy to seafood: Secondary | ICD-10-CM

## 2023-11-03 DIAGNOSIS — R079 Chest pain, unspecified: Secondary | ICD-10-CM | POA: Diagnosis not present

## 2023-11-03 DIAGNOSIS — R109 Unspecified abdominal pain: Secondary | ICD-10-CM | POA: Diagnosis not present

## 2023-11-03 DIAGNOSIS — Z510 Encounter for antineoplastic radiation therapy: Secondary | ICD-10-CM | POA: Insufficient documentation

## 2023-11-03 DIAGNOSIS — I130 Hypertensive heart and chronic kidney disease with heart failure and stage 1 through stage 4 chronic kidney disease, or unspecified chronic kidney disease: Secondary | ICD-10-CM | POA: Diagnosis present

## 2023-11-03 DIAGNOSIS — Z8249 Family history of ischemic heart disease and other diseases of the circulatory system: Secondary | ICD-10-CM | POA: Diagnosis not present

## 2023-11-03 DIAGNOSIS — Z7982 Long term (current) use of aspirin: Secondary | ICD-10-CM

## 2023-11-03 DIAGNOSIS — Z800 Family history of malignant neoplasm of digestive organs: Secondary | ICD-10-CM

## 2023-11-03 DIAGNOSIS — D696 Thrombocytopenia, unspecified: Secondary | ICD-10-CM | POA: Diagnosis present

## 2023-11-03 DIAGNOSIS — Z885 Allergy status to narcotic agent status: Secondary | ICD-10-CM

## 2023-11-03 DIAGNOSIS — I5032 Chronic diastolic (congestive) heart failure: Secondary | ICD-10-CM | POA: Diagnosis present

## 2023-11-03 DIAGNOSIS — I10 Essential (primary) hypertension: Secondary | ICD-10-CM | POA: Diagnosis not present

## 2023-11-03 DIAGNOSIS — K8020 Calculus of gallbladder without cholecystitis without obstruction: Secondary | ICD-10-CM | POA: Diagnosis not present

## 2023-11-03 DIAGNOSIS — Z7985 Long-term (current) use of injectable non-insulin antidiabetic drugs: Secondary | ICD-10-CM

## 2023-11-03 DIAGNOSIS — I2510 Atherosclerotic heart disease of native coronary artery without angina pectoris: Secondary | ICD-10-CM | POA: Diagnosis present

## 2023-11-03 DIAGNOSIS — Z7902 Long term (current) use of antithrombotics/antiplatelets: Secondary | ICD-10-CM

## 2023-11-03 DIAGNOSIS — Z881 Allergy status to other antibiotic agents status: Secondary | ICD-10-CM

## 2023-11-03 DIAGNOSIS — Z8546 Personal history of malignant neoplasm of prostate: Secondary | ICD-10-CM | POA: Diagnosis not present

## 2023-11-03 DIAGNOSIS — I700 Atherosclerosis of aorta: Secondary | ICD-10-CM | POA: Diagnosis present

## 2023-11-03 DIAGNOSIS — G4733 Obstructive sleep apnea (adult) (pediatric): Secondary | ICD-10-CM | POA: Diagnosis present

## 2023-11-03 DIAGNOSIS — Z7984 Long term (current) use of oral hypoglycemic drugs: Secondary | ICD-10-CM | POA: Diagnosis not present

## 2023-11-03 DIAGNOSIS — Z91041 Radiographic dye allergy status: Secondary | ICD-10-CM

## 2023-11-03 DIAGNOSIS — Z833 Family history of diabetes mellitus: Secondary | ICD-10-CM | POA: Diagnosis not present

## 2023-11-03 DIAGNOSIS — C61 Malignant neoplasm of prostate: Secondary | ICD-10-CM | POA: Insufficient documentation

## 2023-11-03 DIAGNOSIS — K5730 Diverticulosis of large intestine without perforation or abscess without bleeding: Secondary | ICD-10-CM | POA: Diagnosis not present

## 2023-11-03 DIAGNOSIS — Z8379 Family history of other diseases of the digestive system: Secondary | ICD-10-CM

## 2023-11-03 DIAGNOSIS — I252 Old myocardial infarction: Secondary | ICD-10-CM | POA: Diagnosis not present

## 2023-11-03 DIAGNOSIS — E039 Hypothyroidism, unspecified: Secondary | ICD-10-CM | POA: Diagnosis present

## 2023-11-03 DIAGNOSIS — D631 Anemia in chronic kidney disease: Secondary | ICD-10-CM | POA: Diagnosis present

## 2023-11-03 DIAGNOSIS — E860 Dehydration: Secondary | ICD-10-CM | POA: Diagnosis present

## 2023-11-03 DIAGNOSIS — N400 Enlarged prostate without lower urinary tract symptoms: Secondary | ICD-10-CM | POA: Diagnosis present

## 2023-11-03 DIAGNOSIS — E782 Mixed hyperlipidemia: Secondary | ICD-10-CM | POA: Diagnosis present

## 2023-11-03 DIAGNOSIS — N1831 Chronic kidney disease, stage 3a: Secondary | ICD-10-CM | POA: Diagnosis present

## 2023-11-03 DIAGNOSIS — Z79899 Other long term (current) drug therapy: Secondary | ICD-10-CM

## 2023-11-03 DIAGNOSIS — R300 Dysuria: Secondary | ICD-10-CM | POA: Insufficient documentation

## 2023-11-03 DIAGNOSIS — E871 Hypo-osmolality and hyponatremia: Principal | ICD-10-CM | POA: Diagnosis present

## 2023-11-03 DIAGNOSIS — Z955 Presence of coronary angioplasty implant and graft: Secondary | ICD-10-CM

## 2023-11-03 DIAGNOSIS — Z79890 Hormone replacement therapy (postmenopausal): Secondary | ICD-10-CM

## 2023-11-03 DIAGNOSIS — Z85820 Personal history of malignant melanoma of skin: Secondary | ICD-10-CM

## 2023-11-03 DIAGNOSIS — Z882 Allergy status to sulfonamides status: Secondary | ICD-10-CM

## 2023-11-03 DIAGNOSIS — E1122 Type 2 diabetes mellitus with diabetic chronic kidney disease: Secondary | ICD-10-CM | POA: Diagnosis present

## 2023-11-03 DIAGNOSIS — Z888 Allergy status to other drugs, medicaments and biological substances status: Secondary | ICD-10-CM

## 2023-11-03 LAB — COMPREHENSIVE METABOLIC PANEL WITH GFR
ALT: 12 U/L (ref 0–44)
AST: 14 U/L — ABNORMAL LOW (ref 15–41)
Albumin: 3.7 g/dL (ref 3.5–5.0)
Alkaline Phosphatase: 30 U/L — ABNORMAL LOW (ref 38–126)
Anion gap: 9 (ref 5–15)
BUN: 30 mg/dL — ABNORMAL HIGH (ref 8–23)
CO2: 20 mmol/L — ABNORMAL LOW (ref 22–32)
Calcium: 8.9 mg/dL (ref 8.9–10.3)
Chloride: 89 mmol/L — ABNORMAL LOW (ref 98–111)
Creatinine, Ser: 1.33 mg/dL — ABNORMAL HIGH (ref 0.61–1.24)
GFR, Estimated: 55 mL/min — ABNORMAL LOW (ref >60)
Glucose, Bld: 90 mg/dL (ref 70–99)
Potassium: 3.8 mmol/L (ref 3.5–5.1)
Sodium: 118 mmol/L — CL (ref 135–145)
Total Bilirubin: 1.1 mg/dL (ref 0.0–1.2)
Total Protein: 6 g/dL — ABNORMAL LOW (ref 6.5–8.1)

## 2023-11-03 LAB — CBC WITH DIFFERENTIAL/PLATELET
Abs Immature Granulocytes: 0.01 10*3/uL (ref 0.00–0.07)
Basophils Absolute: 0 10*3/uL (ref 0.0–0.1)
Basophils Relative: 1 %
Eosinophils Absolute: 0.2 10*3/uL (ref 0.0–0.5)
Eosinophils Relative: 4 %
HCT: 25.1 % — ABNORMAL LOW (ref 39.0–52.0)
Hemoglobin: 9.2 g/dL — ABNORMAL LOW (ref 13.0–17.0)
Immature Granulocytes: 0 %
Lymphocytes Relative: 22 %
Lymphs Abs: 1 10*3/uL (ref 0.7–4.0)
MCH: 33.2 pg (ref 26.0–34.0)
MCHC: 36.7 g/dL — ABNORMAL HIGH (ref 30.0–36.0)
MCV: 90.6 fL (ref 80.0–100.0)
Monocytes Absolute: 0.6 10*3/uL (ref 0.1–1.0)
Monocytes Relative: 13 %
Neutro Abs: 2.8 10*3/uL (ref 1.7–7.7)
Neutrophils Relative %: 60 %
Platelets: 138 10*3/uL — ABNORMAL LOW (ref 150–400)
RBC: 2.77 MIL/uL — ABNORMAL LOW (ref 4.22–5.81)
RDW: 12.3 % (ref 11.5–15.5)
WBC: 4.6 10*3/uL (ref 4.0–10.5)
nRBC: 0 % (ref 0.0–0.2)

## 2023-11-03 LAB — RAD ONC ARIA SESSION SUMMARY
Course Elapsed Days: 1
Plan Fractions Treated to Date: 2
Plan Prescribed Dose Per Fraction: 2.5 Gy
Plan Total Fractions Prescribed: 28
Plan Total Prescribed Dose: 70 Gy
Reference Point Dosage Given to Date: 5 Gy
Reference Point Session Dosage Given: 2.5 Gy
Session Number: 2

## 2023-11-03 LAB — URINALYSIS, ROUTINE W REFLEX MICROSCOPIC
Bilirubin Urine: NEGATIVE
Glucose, UA: NEGATIVE mg/dL
Hgb urine dipstick: NEGATIVE
Ketones, ur: NEGATIVE mg/dL
Leukocytes,Ua: NEGATIVE
Nitrite: NEGATIVE
Protein, ur: NEGATIVE mg/dL
Specific Gravity, Urine: 1.004 — ABNORMAL LOW (ref 1.005–1.030)
pH: 6 (ref 5.0–8.0)

## 2023-11-03 MED ORDER — SODIUM CHLORIDE 0.9 % IV BOLUS
1000.0000 mL | Freq: Once | INTRAVENOUS | Status: AC
  Administered 2023-11-03: 1000 mL via INTRAVENOUS

## 2023-11-03 MED ORDER — CHLORHEXIDINE GLUCONATE CLOTH 2 % EX PADS
6.0000 | MEDICATED_PAD | Freq: Every day | CUTANEOUS | Status: DC
  Administered 2023-11-04 – 2023-11-05 (×2): 6 via TOPICAL

## 2023-11-03 NOTE — ED Notes (Signed)
 Patient transported to CT

## 2023-11-03 NOTE — H&P (Signed)
 History and Physical    Patient: Kevin [name] FMW:[id number] DOB: Aug 20, 1945 DOA: 11/03/2023 DOS: the patient was seen and examined on 11/04/2023 PCP: Marvine Rush, MD  Patient coming from: SNF  Chief Complaint:  Chief Complaint  Patient presents with   Flank Pain   HPI: Kevin [name] is a 78 y.o. male with medical history significant of hypertension, hyperlipidemia, T2DM, OSA not using CPAP, asthma, CAD s/p remote LAD and circumflex stenting  in 1999, 2001,2004, 02/24/2015 who presents to the Emergency Department via EMS from [place] with complaint of 3-week onset of bilateral flank pain and nausea which started today.  Flank pain did not radiate to lower extremities. He was seen in the ED on 6/17 due to dehydration was provided with IV hydration prior to being discharged.  Patient states that he has been drinking a lot of water since that ED visit due to prevent recurrent dehydration.  ED Course:  In the emergency department, he was bradycardic with HR of 59 bpm, other vital signs were within normal range.  Workup in the ED showed normocytic anemia and thrombocytopenia.  BMP showed sodium 118, potassium 3.8, chloride 89, bicarb 20, blood glucose 90, BUN 30, creatinine 1.33 CT abdomen and pelvis without contrast showed increased bilateral perinephric stranding compared to 09/03/2023. This is nonspecific but in the setting of bilateral flank pain could represent pyelonephritis. IV NS 1 L was given.  TRH was asked to admit patient  Review of Systems: Review of systems as noted in the HPI. All other systems reviewed and are negative.   Past Medical History:  Diagnosis Date   Aortic atherosclerosis (HCC) 09/15/2023   Benign localized prostatic hyperplasia with lower urinary tract symptoms (LUTS)    CAD (coronary artery disease) 2001   cardiologist-- dr court;    11/ 2001  acute anferior MI  s/p cath w/ PCI ostial diag & PTCA & BMS to pLAD;   Cath 12/ 2004 stent to mCFx;   cath 02/ 2009 PTCA BMS to dCFx & PTCA/ [place] angio DES to mCFx; cath 07/2013 TO nondominant RCA/ patent LAD& CFx stents;  cath 10/ 2016 DES to mLAD for ISR;  NUC 11-12-2016 LR w/ normal perfusion, nuclear ef 59%   Carpal tunnel syndrome, bilateral    Chronic diastolic CHF (congestive heart failure) (HCC) 07/2013   Chronic rhinitis    Chronic venous stasis    history s/p left GSV ablation yrs ago   CKD (chronic kidney disease), stage III (HCC)    Diverticulosis of colon    Edema of both lower extremities    Environmental and seasonal allergies    History of acute anterior wall MI 2001   History of malignant melanoma 02/2010   previously followed by oncology-- dr verneita (released 2017)  dx 10/ 2011;  02-18-2010  s/p WLE left foot ;   Stage IA,  no SLN bx,  no other treatment and no recurrence   History of non-ST elevation myocardial infarction (NSTEMI) 02/13/2015   History of small bowel obstruction 09/20/2021   s/p  surgical intervention   HTN (hypertension)    Hyperlipidemia, mixed    Hypothyroidism    Incomplete emptying of bladder    Malignant neoplasm prostate [facility]) 08/2023   primary urology--- dr showalter/  radiation oncology--- dr patrcia;   dx 04/ 2025,  Gleason 4+3,  PSA 15.2   OA (osteoarthritis)    knees   OSA (obstructive sleep apnea)    10-15-2023  pt stated  does use CPAP since 2023 after losing 140 lb   Psoriasis    S/p bare metal coronary artery stent    11/ 2001  BMS to pLAD;     02/ 2009 BMS to distal dominant CFx   S/P drug eluting coronary stent placement    02/ 2009  DES to mCFx;  DES to mLAD   Type 2 diabetes mellitus (HCC)    followed by pcp   (10-15-2023  pt stated does check blood sugars at home)   Ventral hernia without obstruction or gangrene    followed by general surgeon--- dr l. bridges   Weak urine stream    Past Surgical History:  Procedure Laterality Date   APPENDECTOMY     1950s   CARDIAC CATHETERIZATION N/A 02/14/2015   Procedure: Left  Heart Cath and Coronary Angiography;  Surgeon: Candyce GORMAN Reek, MD;  Location[facility] INVASIVE CV LAB;  Service: Cardiovascular;  Laterality: N/A;   CARDIAC CATHETERIZATION N/A 02/14/2015   Procedure: Coronary Stent Intervention;  Surgeon: Candyce GORMAN Reek, MD;  Location: [facility] INVASIVE CV LAB;  Service: Cardiovascular;  Laterality: N/A;   CATARACT EXTRACTION W/ INTRAOCULAR LENS IMPLANT Bilateral 2007   COLONOSCOPY N/A 10/09/2010   normal rectum, long redundant colon, pancolonic diverticula, 6mm cecal polyp, diminutive polyp at the appendiceal orifice ablated with tip of hot snare (Biopsies suggestive of microscopic colitis; polyp benign; small bowel ok)   CORONARY ANGIOPLASTY WITH STENT PLACEMENT  06/11/2007   @[facility]   by dr ladona;   PTCA & BMS distal CX, PTCA & balloon angioplasty in-stent restenotic & DES to mid CX   CORONARY ANGIOPLASTY WITH STENT PLACEMENT  03/2000   @ [facility]  by dr b. obie;  PCI to ostial diagnal ;  PTCA and BMS to pLAD   CORONARY ANGIOPLASTY WITH STENT PLACEMENT  04/2003   @[facility]  by dr wyn;  stent to midCFx   ESOPHAGOGASTRODUODENOSCOPY N/A 10/09/2010   Normal esophagus, lap band present, 2nd portion duodenum with erosions without frank ulcer   GASTRIC BANDING PORT REVISION N/A 09/20/2021   Procedure: GASTRIC BAND REMOVAL;  Surgeon: Kallie Manuelita BROCKS, MD;  Location: [facility] ORS;  Service: General;  Laterality: N/A;   GOLD SEED IMPLANT N/A 10/20/2023   Procedure: INSERTION, GOLD SEEDS;  Surgeon: Shane Steffan BROCKS, MD;  Location: [facility] OR;  Service: Urology;  Laterality: N/A;   KNEE ARTHROSCOPY Bilateral    left 10/ 2004;   right 2006   LAPAROSCOPIC GASTRIC BANDING  2009   LAPAROTOMY N/A 09/20/2021   Procedure: EXPLORATORY LAPAROTOMY,  LYSIS OF ADHESIONS, REDUCTION OF VOLVULUS;  Surgeon: Kallie Manuelita BROCKS, MD;  Location: [facility] ORS;  Service: General;  Laterality: N/A;   REMOVAL GASTRIC LAP BAND   LEFT HEART CATHETERIZATION WITH CORONARY ANGIOGRAM N/A 07/15/2013   Procedure: LEFT HEART  CATHETERIZATION WITH CORONARY ANGIOGRAM;  Surgeon: Victory LELON Claudene DOUGLAS, MD;  Location: [facility] CATH LAB;  Service: Cardiovascular;  Laterality: N/A;   MELANOMA EXCISION Left 02/18/2010   @[facility]  by dr b. sebastian;   WLE MELANOMA OF LEFT FOOT   NASAL SEPTUM SURGERY     1990s   NASAL/SINUS ENDOSCOPY  09/18/2006   @[facility]  by dr d. shoemaker;   REVISION SEPTOPLASTY/ RIGHT INFERIOR TURBINATE REDUCTION/ LEFT TOTAL ETHMOIDECTOMY   PENILE PROSTHESIS IMPLANT  1996   SHOULDER SURGERY Left 2004   for staph infection   SPACE OAR INSTILLATION N/A 10/20/2023   Procedure: INJECTION, HYDROGEL SPACER;  Surgeon: Shane Steffan BROCKS, MD;  Location: [facility] OR;  Service: Urology;  Laterality: N/A;   TONSILLECTOMY AND ADENOIDECTOMY     child   TYMPANOSTOMY TUBE PLACEMENT Bilateral    1970s    Social History:  reports that he has quit smoking. His smoking use included cigarettes. He has been exposed to tobacco smoke. He quit smokeless tobacco use about 34 years ago.  His smokeless tobacco use included snuff and chew. He reports that he does not currently use alcohol. He reports that he does not use drugs.   Allergies  Allergen Reactions   Irbesartan Shortness Of Breath and Other (See Comments)   Prednisone  Anaphylaxis, Swelling and Other (See Comments)    Swelling of the lips   Ramipril Shortness Of Breath and Other (See Comments)   Shellfish Allergy  Anaphylaxis    ALL SHELLFISH  Shrimp/  crab   Tradjenta [Linagliptin] Shortness Of Breath   Cephalexin Hives and Swelling   Clindamycin Hcl Hives and Swelling   Codeine Nausea And Vomiting and Other (See Comments)    Affects heart   Contrast Media [Iodinated Contrast Media] Other (See Comments)    Very sick  Can be administered if Benadryl  is administered prior to prevent reaction    Fenofibrate Other (See Comments)    My skin started peeling off   Glycotrol [Support-500] Other (See Comments)    Unknown    Indomethacin Other (See Comments)    Unknown    Minocycline  Hcl Hives and Swelling   Morphine And Codeine Nausea And Vomiting    Projectile vomiting   Sulfa Drugs Cross Reactors Hives and Swelling   Tetracyclines & Related Other (See Comments)    When exposed to sun, skin turned bluish    Family History  Problem Relation Age of Onset   Coronary artery disease Mother    Diabetes Father    Liver cancer Brother 9   Celiac disease Other 12       brother's daughter   Allergic rhinitis Neg Hx    Angioedema Neg Hx    Asthma Neg Hx    Atopy Neg Hx    Eczema Neg Hx    Immunodeficiency Neg Hx    Urticaria Neg Hx      Prior to Admission medications   Medication Sig Start Date End Date Taking? Authorizing Provider  Ascorbic Acid (VITAMIN C) 1000 MG tablet Take 1,000 mg by mouth daily.    [provider]  aspirin  81 MG tablet Take 81 mg by mouth daily.    [provider]  b complex vitamins capsule Take 1 capsule by mouth daily.    [provider]  Cholecalciferol (VITAMIN D3) 25 MCG CAPS Take 1 capsule by mouth every other day.    [provider]  clopidogrel  (PLAVIX ) 75 MG tablet TAKE ONE TABLET BY MOUTH ONCE DAILY. 04/23/23   Court Dorn PARAS, MD  Coenzyme Q10 (COQ-10) 100 MG CAPS Take 1 capsule by mouth daily.    [provider]  diphenhydrAMINE  (BENADRYL ) 25 MG tablet Take 25 mg by mouth every 6 (six) hours as needed for itching or allergies.    [provider]  Flax Oil-Fish Oil-Borage Oil (FISH OIL-FLAX OIL-BORAGE OIL) CAPS Take 3,000 mg by mouth every morning.    [provider]  fluticasone  (FLONASE ) 50 MCG/ACT nasal spray Place 2 sprays into both nostrils daily.    [provider]  furosemide  (LASIX ) 40 MG tablet Take 1 tablet (40 mg total) by mouth daily. Patient taking differently: Take 40 mg by mouth daily. 07/05/23  Ricky Fines, MD  JANUVIA 100 MG tablet Take 100 mg by mouth daily. 03/12/22   [provider]  levothyroxine  (SYNTHROID ) 50 MCG tablet  Take 50 mcg by mouth daily before breakfast.    [provider]  metoprolol  (TOPROL -XL) 100 MG 24 hr tablet Take 100 mg by mouth daily.    [provider]  Misc Natural Products (TURMERIC CURCUMIN) CAPS Take 3 capsules by mouth every morning. 2400 mg/ 3000 mg = total    [provider]  MOUNJARO 2.5 MG/0.5ML Pen Inject 2.5 mg into the skin once a week. MONDAY'S 08/11/23   [provider]  Naphazoline-Pheniramine (EYE ALLERGY  RELIEF OP) Apply to eye as needed.    [provider]  niacin  (NIASPAN ) 1000 MG CR tablet TAKE 1 TABLET BY MOUTH AT BEDTIME. Patient taking differently: Take 1,000 mg by mouth at bedtime. 01/30/23   Court Dorn PARAS, MD  Omega-3 Fatty Acids (FISH OIL PO) Take 1 capsule by mouth daily.    [provider]  oxyCODONE  (ROXICODONE ) 5 MG immediate release tablet Take 1 tablet (5 mg total) by mouth every 6 (six) hours as needed for up to 2 doses for severe pain (pain score 7-10). 10/20/23   Shane Steffan BROCKS, MD  pravastatin  (PRAVACHOL ) 40 MG tablet Take 1 tablet (40 mg total) by mouth every evening. Patient taking differently: Take 40 mg by mouth every evening. 04/18/19   Court Dorn PARAS, MD  tamsulosin  (FLOMAX ) 0.4 MG CAPS capsule Take 0.4 mg by mouth every evening. 07/17/20   [provider]  valsartan -hydrochlorothiazide  (DIOVAN -HCT) 160-12.5 MG tablet Take 1 tablet by mouth daily. 09/07/23   [provider]    Physical Exam: BP (!) 134/48   Pulse 67   Temp 97.6 F (36.4 C) (Oral)   Resp 16   SpO2 94%   General: 78 y.o. year-old male well developed well nourished in no acute distress.  Alert and oriented x3. HEENT: NCAT, EOMI Neck: Supple, trachea medial Cardiovascular: Regular rate and rhythm with no rubs or gallops.  No thyromegaly or JVD noted.  No lower extremity edema. 2/4 pulses in all 4 extremities. Respiratory: Clear to auscultation with no wheezes or rales. Good inspiratory effort. Abdomen:  Soft, nontender nondistended with normal bowel sounds x4 quadrants. Muskuloskeletal: No cyanosis, clubbing or edema noted bilaterally Neuro: CN II-XII intact, strength 5/5 x 4, sensation, reflexes intact Skin: No ulcerative lesions noted or rashes Psychiatry: Judgement and insight appear normal. Mood is appropriate for condition and setting          Labs on Admission:  Basic Metabolic Panel: Recent Labs  Lab 11/03/23 2037  NA 118*  K 3.8  CL 89*  CO2 20*  GLUCOSE 90  BUN 30*  CREATININE 1.33*  CALCIUM 8.9   Liver Function Tests: Recent Labs  Lab 11/03/23 2037  AST 14*  ALT 12  ALKPHOS 30*  BILITOT 1.1  PROT 6.0*  ALBUMIN  3.7   No results for input(s): LIPASE, AMYLASE in the last 168 hours. No results for input(s): AMMONIA in the last 168 hours. CBC: Recent Labs  Lab 11/03/23 2037  WBC 4.6  NEUTROABS 2.8  HGB 9.2*  HCT 25.1*  MCV 90.6  PLT 138*   Cardiac Enzymes: No results for input(s): CKTOTAL, CKMB, CKMBINDEX, TROPONINI in the last 168 hours.  BNP (last 3 results) No results for input(s): BNP in the last 8760 hours.  ProBNP (last 3 results) No results for input(s): PROBNP in the last 8760 hours.  CBG: No results for input(s): GLUCAP in the last 168 hours.  Radiological Exams on Admission: CT ABDOMEN PELVIS WO CONTRAST Result Date: 11/03/2023 CLINICAL DATA:  Acute nonlocalized abdominal pain and bilateral flank pain. Nausea. EXAM: CT ABDOMEN AND PELVIS WITHOUT CONTRAST TECHNIQUE: Multidetector CT imaging of the abdomen and pelvis was performed following the standard protocol without IV contrast. RADIATION DOSE REDUCTION: This exam was performed according to the departmental dose-optimization program which includes automated exposure control, adjustment of the mA and/or kV according to patient size and/or use of iterative reconstruction technique. COMPARISON:  PET/CT 09/03/2023 and CT abdomen pelvis 10/23/2022 FINDINGS: Lower chest: No  acute abnormality. Hepatobiliary: Cholelithiasis without evidence of acute cholecystitis. Unremarkable noncontrast appearance of the liver. Pancreas: Unremarkable. Spleen: Unremarkable. Adrenals/Urinary Tract: Normal adrenal glands. Slightly increased bilateral perinephric stranding compared to 09/03/2023. No urinary calculi or hydronephrosis. Unremarkable bladder. Stomach/Bowel: Normal caliber large and small bowel. No bowel wall thickening. Colonic diverticulosis without diverticulitis. Herniation of small bowel into a supraumbilical ventral abdominal wall hernia. No evidence of obstruction or strangulation. Stomach is within normal limits. The appendix is not visualized. No secondary signs of appendicitis. Vascular/Lymphatic: Aortic atherosclerosis. Unchanged size of the 1.4 cm left external iliac node which was radiotracer avid on PET/CT 09/03/2023 (series 2/image 71). Reproductive: Penile prosthesis. Enlarged prostate containing fiducial markers. Other: Perinephric stranding extending inferiorly in the anterior pararenal space. No free intraperitoneal fluid. No intraperitoneal free gas. No abscess. Musculoskeletal: No acute fracture. IMPRESSION: 1. Increased bilateral perinephric stranding compared to 09/03/2023. This is nonspecific but in the setting of bilateral flank pain could represent pyelonephritis. 2. No urinary calculi or hydronephrosis. 3. Unchanged size of the 1.4 cm left external iliac node which was radiotracer avid on PET/CT 09/03/2023. 4. Cholelithiasis without evidence of acute cholecystitis. 5. Herniation of small bowel into a supraumbilical ventral abdominal wall hernia. No evidence of obstruction or strangulation. 6. Aortic Atherosclerosis (ICD10-I70.0). Electronically Signed   By: Norman Gatlin M.D.   On: 11/03/2023 21:36    EKG: I independently viewed the EKG done and my findings are as followed: EKG was not done in the ED  Assessment/Plan Present on Admission:  Hyponatremia   Coronary artery disease, hx LAD and LCX stenting in 2001 & 2004, last cath 2009 with patent stents.  Essential hypertension  Principal Problem:   Hyponatremia Active Problems:   Coronary artery disease, hx LAD and LCX stenting in 2001 & 2004, last cath 2009 with patent stents.   Essential hypertension   Low back pain   Thrombocytopenia (HCC)   BPH (benign prostatic hyperplasia)   Acquired hypothyroidism  Hyponatremia Na 118; this is possibly multifactorial Patient states that he has been drinking a lot of water since last visit to ED due to dehydration, but he was also on diuretics (Lasix , HCTZ). Continue gentle hydration Continue to monitor sodium with serial BMPs Urine osmolality serum osmolality and urine sodium will be checked  Low back pain Continue oxycodone  5 mg every 4 hours as needed  Thrombocytopenia possibly reactive Platelets 138, Continue to monitor platelet levels  CAD Status post remote LAD and circumflex stenting in 1999, 2001,2004, 02/24/2015  ,  Patient is chest pain-free and asymptomatic Continue aspirin , Plavix , pravastatin , metoprolol   T2DM A1c on 06/25/2023 was 6.4  Hold Mounjaro, Januvia Use Novolog /Humalog Sliding scale insulin  with Accu-Cheks/Fingersticks    Essential hypertension Continue metoprolol   Hold Diovan   BPH Continue Flomax    Acquired hypothyroidism Continue levothyroxine    DVT prophylaxis: Subcu heparin   Code Status: Full code  Family Communication: None at bedside  Consults: None  Severity of Illness: The appropriate patient status for this patient is INPATIENT. Inpatient status is judged to be reasonable and necessary in order to provide the required intensity of service to ensure the patient's safety. The patient's presenting symptoms, physical exam findings, and initial radiographic and laboratory data in the context of their chronic comorbidities is felt to place them at high risk for further clinical deterioration.  Furthermore, it is not anticipated that the patient will be medically stable for discharge from the hospital within 2 midnights of admission.   * I certify that at the point of admission it is my clinical judgment that the patient will require inpatient hospital care spanning beyond 2 midnights from the point of admission due to high intensity of service, high risk for further deterioration and high frequency of surveillance required.*  Critical time: 69 minutes   Critical care personally provided  managing the patient due to high probability of clinically significant and life threatening deterioration. This critical care time included obtaining a history; examining the patient, pulse oximetry; ordering and review of studies; arranging urgent treatment with development of a management plan; evaluation of patient's response of treatment; frequent reassessment; and discussions with other providers.  This critical care time was performed to assess and manage the high probability of imminent and life threatening deterioration that could result in multi-organ failure.   Author: Tyrell Seifer, DO 11/04/2023 3:35 AM  For on call review [email or web address].

## 2023-11-03 NOTE — ED Provider Notes (Signed)
 [place] EMERGENCY DEPARTMENT AT [facility] Provider Note   CSN: [id number] Arrival date & time: 11/03/23  2027     Patient presents with: Flank Pain   Kevin [name] is a 78 y.o. male.  {Add pertinent medical, surgical, social history, OB history to YEP:[id number]} Patient complains of back pain and weakness   Flank Pain       Prior to Admission medications   Medication Sig Start Date End Date Taking? Authorizing Provider  Ascorbic Acid (VITAMIN C) 1000 MG tablet Take 1,000 mg by mouth daily.    [provider]  aspirin  81 MG tablet Take 81 mg by mouth daily.    [provider]  b complex vitamins capsule Take 1 capsule by mouth daily.    [provider]  Cholecalciferol (VITAMIN D3) 25 MCG CAPS Take 1 capsule by mouth every other day.    [provider]  clopidogrel  (PLAVIX ) 75 MG tablet TAKE ONE TABLET BY MOUTH ONCE DAILY. 04/23/23   Court Dorn PARAS, MD  Coenzyme Q10 (COQ-10) 100 MG CAPS Take 1 capsule by mouth daily.    [provider]  diphenhydrAMINE  (BENADRYL ) 25 MG tablet Take 25 mg by mouth every 6 (six) hours as needed for itching or allergies.    [provider]  Flax Oil-Fish Oil-Borage Oil (FISH OIL-FLAX OIL-BORAGE OIL) CAPS Take 3,000 mg by mouth every morning.    [provider]  fluticasone  (FLONASE ) 50 MCG/ACT nasal spray Place 2 sprays into both nostrils daily.    [provider]  furosemide  (LASIX ) 40 MG tablet Take 1 tablet (40 mg total) by mouth daily. Patient taking differently: Take 40 mg by mouth daily. 07/05/23   Ricky Fines, MD  JANUVIA 100 MG tablet Take 100 mg by mouth daily. 03/12/22   [provider]  levothyroxine  (SYNTHROID ) 50 MCG tablet Take 50 mcg by mouth daily before breakfast.    [provider]  metoprolol  (TOPROL -XL) 100 MG 24 hr tablet Take 100 mg by mouth daily.    [provider]  Misc Natural Products (TURMERIC CURCUMIN) CAPS  Take 3 capsules by mouth every morning. 2400 mg/ 3000 mg = total    [provider]  MOUNJARO 2.5 MG/0.5ML Pen Inject 2.5 mg into the skin once a week. MONDAY'S 08/11/23   [provider]  Naphazoline-Pheniramine (EYE ALLERGY  RELIEF OP) Apply to eye as needed.    [provider]  niacin  (NIASPAN ) 1000 MG CR tablet TAKE 1 TABLET BY MOUTH AT BEDTIME. Patient taking differently: Take 1,000 mg by mouth at bedtime. 01/30/23   Court Dorn PARAS, MD  Omega-3 Fatty Acids (FISH OIL PO) Take 1 capsule by mouth daily.    [provider]  oxyCODONE  (ROXICODONE ) 5 MG immediate release tablet Take 1 tablet (5 mg total) by mouth every 6 (six) hours as needed for up to 2 doses for severe pain (pain score 7-10). 10/20/23   Shane Steffan BROCKS, MD  pravastatin  (PRAVACHOL ) 40 MG tablet Take 1 tablet (40 mg total) by mouth every evening. Patient taking differently: Take 40 mg by mouth every evening. 04/18/19   Court Dorn PARAS, MD  tamsulosin  (FLOMAX ) 0.4 MG CAPS capsule Take 0.4 mg by mouth every evening. 07/17/20   [provider]  valsartan -hydrochlorothiazide  (DIOVAN -HCT) 160-12.5 MG tablet Take 1 tablet by mouth daily. 09/07/23   [provider]    Allergies: Irbesartan, Prednisone , Ramipril, Shellfish allergy , Tradjenta [linagliptin], Cephalexin, Clindamycin hcl, Codeine, Contrast media [iodinated contrast media],  Fenofibrate, Glycotrol [support-500], Indomethacin, Minocycline hcl, Morphine and codeine, Sulfa drugs cross reactors, and Tetracyclines & related    Review of Systems  Genitourinary:  Positive for flank pain.    Updated Vital Signs BP (!) 143/100   Pulse 67   Temp 98.4 F (36.9 C) (Oral)   Resp 17   SpO2 100%   Physical Exam  (all labs ordered are listed, but only abnormal results are displayed) Labs Reviewed  CBC WITH DIFFERENTIAL/PLATELET - Abnormal; Notable for the following components:      Result Value   RBC 2.77 (*)    Hemoglobin  9.2 (*)    HCT 25.1 (*)    MCHC 36.7 (*)    Platelets 138 (*)    All other components within normal limits  COMPREHENSIVE METABOLIC PANEL WITH GFR - Abnormal; Notable for the following components:   Sodium 118 (*)    Chloride 89 (*)    CO2 20 (*)    BUN 30 (*)    Creatinine, Ser 1.33 (*)    Total Protein 6.0 (*)    AST 14 (*)    Alkaline Phosphatase 30 (*)    GFR, Estimated 55 (*)    All other components within normal limits  URINALYSIS, ROUTINE W REFLEX MICROSCOPIC - Abnormal; Notable for the following components:   Color, Urine COLORLESS (*)    Specific Gravity, Urine 1.004 (*)    All other components within normal limits    EKG: None  Radiology: CT ABDOMEN PELVIS WO CONTRAST Result Date: 11/03/2023 CLINICAL DATA:  Acute nonlocalized abdominal pain and bilateral flank pain. Nausea. EXAM: CT ABDOMEN AND PELVIS WITHOUT CONTRAST TECHNIQUE: Multidetector CT imaging of the abdomen and pelvis was performed following the standard protocol without IV contrast. RADIATION DOSE REDUCTION: This exam was performed according to the departmental dose-optimization program which includes automated exposure control, adjustment of the mA and/or kV according to patient size and/or use of iterative reconstruction technique. COMPARISON:  PET/CT 09/03/2023 and CT abdomen pelvis 10/23/2022 FINDINGS: Lower chest: No acute abnormality. Hepatobiliary: Cholelithiasis without evidence of acute cholecystitis. Unremarkable noncontrast appearance of the liver. Pancreas: Unremarkable. Spleen: Unremarkable. Adrenals/Urinary Tract: Normal adrenal glands. Slightly increased bilateral perinephric stranding compared to 09/03/2023. No urinary calculi or hydronephrosis. Unremarkable bladder. Stomach/Bowel: Normal caliber large and small bowel. No bowel wall thickening. Colonic diverticulosis without diverticulitis. Herniation of small bowel into a supraumbilical ventral abdominal wall hernia. No evidence of obstruction or  strangulation. Stomach is within normal limits. The appendix is not visualized. No secondary signs of appendicitis. Vascular/Lymphatic: Aortic atherosclerosis. Unchanged size of the 1.4 cm left external iliac node which was radiotracer avid on PET/CT 09/03/2023 (series 2/image 71). Reproductive: Penile prosthesis. Enlarged prostate containing fiducial markers. Other: Perinephric stranding extending inferiorly in the anterior pararenal space. No free intraperitoneal fluid. No intraperitoneal free gas. No abscess. Musculoskeletal: No acute fracture. IMPRESSION: 1. Increased bilateral perinephric stranding compared to 09/03/2023. This is nonspecific but in the setting of bilateral flank pain could represent pyelonephritis. 2. No urinary calculi or hydronephrosis. 3. Unchanged size of the 1.4 cm left external iliac node which was radiotracer avid on PET/CT 09/03/2023. 4. Cholelithiasis without evidence of acute cholecystitis. 5. Herniation of small bowel into a supraumbilical ventral abdominal wall hernia. No evidence of obstruction or strangulation. 6. Aortic Atherosclerosis (ICD10-I70.0). Electronically Signed   By: Norman Gatlin M.D.   On: 11/03/2023 21:36    {Document cardiac monitor, telemetry assessment procedure when appropriate:32947} Procedures   Medications Ordered in the ED  sodium chloride  0.9 % bolus 1,000 mL (1,000 mLs Intravenous New Bag/Given 11/03/23 2146)     CRITICAL CARE Performed by: Fairy Sermon Total critical care time: 40 minutes Critical care time was exclusive of separately billable procedures and treating other patients. Critical care was necessary to treat or prevent imminent or life-threatening deterioration. Critical care was time spent personally by me on the following activities: development of treatment plan with patient and/or surrogate as well as nursing, discussions with consultants, evaluation of patient's response to treatment, examination of patient, obtaining  history from patient or surrogate, ordering and performing treatments and interventions, ordering and review of laboratory studies, ordering and review of radiographic studies, pulse oximetry and re-evaluation of patient's condition.  {Click here for ABCD2, HEART and other calculators REFRESH Note before signing:1}                              Medical Decision Making Amount and/or Complexity of Data Reviewed Labs: ordered. Radiology: ordered.  Patient with hyponatremia.  He will be admitted to medicine  {Document critical care time when appropriate  Document review of labs and clinical decision tools ie CHADS2VASC2, etc  Document your independent review of radiology images and any outside records  Document your discussion with family members, caretakers and with consultants  Document social determinants of health affecting pt's care  Document your decision making why or why not admission, treatments were needed:32947:::1}   Final diagnoses:  None    ED Discharge Orders     None

## 2023-11-03 NOTE — ED Triage Notes (Signed)
 Pt bib [facility] from the [facility] c/o bilateral flank pain x3 weeks started feeling nauseated today.

## 2023-11-04 ENCOUNTER — Telehealth: Payer: Self-pay

## 2023-11-04 ENCOUNTER — Ambulatory Visit

## 2023-11-04 DIAGNOSIS — I10 Essential (primary) hypertension: Secondary | ICD-10-CM | POA: Diagnosis not present

## 2023-11-04 DIAGNOSIS — E039 Hypothyroidism, unspecified: Secondary | ICD-10-CM | POA: Diagnosis not present

## 2023-11-04 DIAGNOSIS — D696 Thrombocytopenia, unspecified: Secondary | ICD-10-CM | POA: Diagnosis not present

## 2023-11-04 DIAGNOSIS — N400 Enlarged prostate without lower urinary tract symptoms: Secondary | ICD-10-CM | POA: Diagnosis present

## 2023-11-04 DIAGNOSIS — M545 Low back pain, unspecified: Secondary | ICD-10-CM | POA: Diagnosis present

## 2023-11-04 DIAGNOSIS — E871 Hypo-osmolality and hyponatremia: Secondary | ICD-10-CM | POA: Diagnosis not present

## 2023-11-04 LAB — GLUCOSE, CAPILLARY
Glucose-Capillary: 78 mg/dL (ref 70–99)
Glucose-Capillary: 113 mg/dL — ABNORMAL HIGH (ref 70–99)
Glucose-Capillary: 129 mg/dL — ABNORMAL HIGH (ref 70–99)
Glucose-Capillary: 118 mg/dL — ABNORMAL HIGH (ref 70–99)

## 2023-11-04 LAB — MAGNESIUM: Magnesium: 1.6 mg/dL — ABNORMAL LOW (ref 1.7–2.4)

## 2023-11-04 LAB — BASIC METABOLIC PANEL WITH GFR
Anion gap: 9 (ref 5–15)
Anion gap: 7 (ref 5–15)
BUN: 26 mg/dL — ABNORMAL HIGH (ref 8–23)
BUN: 27 mg/dL — ABNORMAL HIGH (ref 8–23)
CO2: 22 mmol/L (ref 22–32)
CO2: 23 mmol/L (ref 22–32)
Calcium: 9.2 mg/dL (ref 8.9–10.3)
Calcium: 9 mg/dL (ref 8.9–10.3)
Chloride: 92 mmol/L — ABNORMAL LOW (ref 98–111)
Chloride: 95 mmol/L — ABNORMAL LOW (ref 98–111)
Creatinine, Ser: 1.16 mg/dL (ref 0.61–1.24)
Creatinine, Ser: 1.24 mg/dL (ref 0.61–1.24)
GFR, Estimated: >60 mL/min (ref >60)
GFR, Estimated: 60 mL/min — ABNORMAL LOW (ref >60)
Glucose, Bld: 125 mg/dL — ABNORMAL HIGH (ref 70–99)
Glucose, Bld: 191 mg/dL — ABNORMAL HIGH (ref 70–99)
Potassium: 3.7 mmol/L (ref 3.5–5.1)
Potassium: 4.3 mmol/L (ref 3.5–5.1)
Sodium: 123 mmol/L — ABNORMAL LOW (ref 135–145)
Sodium: 125 mmol/L — ABNORMAL LOW (ref 135–145)

## 2023-11-04 LAB — COMPREHENSIVE METABOLIC PANEL WITH GFR
ALT: 12 U/L (ref 0–44)
AST: 14 U/L — ABNORMAL LOW (ref 15–41)
Albumin: 3.7 g/dL (ref 3.5–5.0)
Alkaline Phosphatase: 31 U/L — ABNORMAL LOW (ref 38–126)
Anion gap: 6 (ref 5–15)
BUN: 28 mg/dL — ABNORMAL HIGH (ref 8–23)
CO2: 22 mmol/L (ref 22–32)
Calcium: 9.1 mg/dL (ref 8.9–10.3)
Chloride: 93 mmol/L — ABNORMAL LOW (ref 98–111)
Creatinine, Ser: 1.21 mg/dL (ref 0.61–1.24)
GFR, Estimated: >60 mL/min (ref >60)
Glucose, Bld: 89 mg/dL (ref 70–99)
Potassium: 4 mmol/L (ref 3.5–5.1)
Sodium: 121 mmol/L — ABNORMAL LOW (ref 135–145)
Total Bilirubin: 0.8 mg/dL (ref 0.0–1.2)
Total Protein: 6.4 g/dL — ABNORMAL LOW (ref 6.5–8.1)

## 2023-11-04 LAB — OSMOLALITY: Osmolality: 263 mosm/kg — ABNORMAL LOW (ref 275–295)

## 2023-11-04 LAB — CBC
HCT: 27.5 % — ABNORMAL LOW (ref 39.0–52.0)
Hemoglobin: 9.7 g/dL — ABNORMAL LOW (ref 13.0–17.0)
MCH: 32.7 pg (ref 26.0–34.0)
MCHC: 35.3 g/dL (ref 30.0–36.0)
MCV: 92.6 fL (ref 80.0–100.0)
Platelets: 130 10*3/uL — ABNORMAL LOW (ref 150–400)
RBC: 2.97 MIL/uL — ABNORMAL LOW (ref 4.22–5.81)
RDW: 12.4 % (ref 11.5–15.5)
WBC: 3.9 10*3/uL — ABNORMAL LOW (ref 4.0–10.5)
nRBC: 0 % (ref 0.0–0.2)

## 2023-11-04 LAB — MRSA NEXT GEN BY PCR, NASAL: MRSA by PCR Next Gen: NOT DETECTED

## 2023-11-04 LAB — SODIUM, URINE, RANDOM: Sodium, Ur: 29 mmol/L

## 2023-11-04 LAB — PHOSPHORUS: Phosphorus: 2.7 mg/dL (ref 2.5–4.6)

## 2023-11-04 LAB — OSMOLALITY, URINE: Osmolality, Ur: 159 mosm/kg — ABNORMAL LOW (ref 300–900)

## 2023-11-04 MED ORDER — HEPARIN SODIUM (PORCINE) 5000 UNIT/ML IJ SOLN
5000.0000 [IU] | Freq: Three times a day (TID) | INTRAMUSCULAR | Status: DC
  Administered 2023-11-04 – 2023-11-05 (×4): 5000 [IU] via SUBCUTANEOUS
  Filled 2023-11-04 (×4): qty 1

## 2023-11-04 MED ORDER — NIACIN ER 500 MG PO TBCR
1000.0000 mg | EXTENDED_RELEASE_TABLET | Freq: Every day | ORAL | Status: DC
  Administered 2023-11-04: 1000 mg via ORAL
  Filled 2023-11-04 (×2): qty 2

## 2023-11-04 MED ORDER — TAMSULOSIN HCL 0.4 MG PO CAPS
0.4000 mg | ORAL_CAPSULE | Freq: Every evening | ORAL | Status: DC
  Administered 2023-11-04: 0.4 mg via ORAL
  Filled 2023-11-04: qty 1

## 2023-11-04 MED ORDER — ASPIRIN 81 MG PO TBEC
81.0000 mg | DELAYED_RELEASE_TABLET | Freq: Every day | ORAL | Status: DC
Start: 2023-11-04 — End: 2023-11-05
  Administered 2023-11-04 – 2023-11-05 (×2): 81 mg via ORAL
  Filled 2023-11-04 (×2): qty 1

## 2023-11-04 MED ORDER — SODIUM CHLORIDE 0.9 % IV SOLN: INTRAVENOUS | Status: DC

## 2023-11-04 MED ORDER — INSULIN ASPART 100 UNIT/ML IJ SOLN: 0.0000 [IU] | Freq: Three times a day (TID) | INTRAMUSCULAR | Status: DC

## 2023-11-04 MED ORDER — PANTOPRAZOLE SODIUM 40 MG PO TBEC
40.0000 mg | DELAYED_RELEASE_TABLET | Freq: Every day | ORAL | Status: DC
  Administered 2023-11-04 – 2023-11-05 (×2): 40 mg via ORAL
  Filled 2023-11-04 (×2): qty 1

## 2023-11-04 MED ORDER — ONDANSETRON HCL 4 MG PO TABS: 4.0000 mg | ORAL_TABLET | Freq: Four times a day (QID) | ORAL | Status: DC | PRN

## 2023-11-04 MED ORDER — IPRATROPIUM-ALBUTEROL 0.5-2.5 (3) MG/3ML IN SOLN: 3.0000 mL | Freq: Four times a day (QID) | RESPIRATORY_TRACT | Status: DC | PRN

## 2023-11-04 MED ORDER — PANTOPRAZOLE SODIUM 40 MG PO TBEC
40.0000 mg | DELAYED_RELEASE_TABLET | Freq: Once | ORAL | Status: AC
  Administered 2023-11-04: 40 mg via ORAL
  Filled 2023-11-04 (×2): qty 1

## 2023-11-04 MED ORDER — CLOPIDOGREL BISULFATE 75 MG PO TABS: 75.0000 mg | ORAL_TABLET | Freq: Every day | ORAL | Status: DC

## 2023-11-04 MED ORDER — METOPROLOL SUCCINATE ER 50 MG PO TB24
100.0000 mg | ORAL_TABLET | Freq: Every day | ORAL | Status: DC
  Administered 2023-11-04 – 2023-11-05 (×2): 100 mg via ORAL
  Filled 2023-11-04 (×2): qty 2

## 2023-11-04 MED ORDER — PRAVASTATIN SODIUM 40 MG PO TABS
40.0000 mg | ORAL_TABLET | Freq: Every evening | ORAL | Status: DC
  Administered 2023-11-04: 40 mg via ORAL
  Filled 2023-11-04: qty 1

## 2023-11-04 MED ORDER — MAGNESIUM SULFATE 4 GM/100ML IV SOLN
4.0000 g | Freq: Once | INTRAVENOUS | Status: AC
  Administered 2023-11-04: 4 g via INTRAVENOUS
  Filled 2023-11-04: qty 100

## 2023-11-04 MED ORDER — ONDANSETRON HCL 4 MG/2ML IJ SOLN
4.0000 mg | Freq: Four times a day (QID) | INTRAMUSCULAR | Status: DC | PRN
  Administered 2023-11-04: 4 mg via INTRAVENOUS
  Filled 2023-11-04: qty 2

## 2023-11-04 MED ORDER — CLOPIDOGREL BISULFATE 75 MG PO TABS
75.0000 mg | ORAL_TABLET | Freq: Every day | ORAL | Status: DC
  Administered 2023-11-04 – 2023-11-05 (×2): 75 mg via ORAL
  Filled 2023-11-04 (×2): qty 1

## 2023-11-04 MED ORDER — ACETAMINOPHEN 650 MG RE SUPP: 650.0000 mg | Freq: Four times a day (QID) | RECTAL | Status: DC | PRN

## 2023-11-04 MED ORDER — OXYCODONE HCL 5 MG PO TABS
5.0000 mg | ORAL_TABLET | ORAL | Status: DC | PRN
  Administered 2023-11-04: 5 mg via ORAL
  Filled 2023-11-04: qty 1

## 2023-11-04 MED ORDER — ACETAMINOPHEN 325 MG PO TABS
650.0000 mg | ORAL_TABLET | Freq: Four times a day (QID) | ORAL | Status: DC | PRN
  Administered 2023-11-04: 650 mg via ORAL
  Filled 2023-11-04: qty 2

## 2023-11-04 MED ORDER — LEVOTHYROXINE SODIUM 25 MCG PO TABS
50.0000 ug | ORAL_TABLET | Freq: Every day | ORAL | Status: DC
  Administered 2023-11-04 – 2023-11-05 (×2): 50 ug via ORAL
  Filled 2023-11-04 (×3): qty 2

## 2023-11-04 NOTE — TOC Initial Note (Signed)
 Transition of Care [facility]) - Initial/Assessment Note    Patient Details  Name: Kevin [name] MRN: [id number] Date of Birth: 04-Sep-1945  Transition of Care [facility]) CM/SW Contact:    Mcarthur Saddie Kim, LCSW Phone Number: 11/04/2023, 8:22 AM  Clinical Narrative: Pt admitted for hyponatremia.  Assessment completed due to high risk readmission score. Pt is resident at [facility] on independent living unit. He plans to return at d/c. Per Chasity at [facility], pt will not require assessment to return and can return at any time. TOC will continue to follow.                    Barriers to Discharge: Continued Medical Work up   Patient Goals and CMS Choice Patient states their goals for this hospitalization and ongoing recovery are:: return to [facility]   Choice offered to / list presented to : Patient [place] ownership interest in [facility].provided to::  (n/a)    Expected Discharge Plan and Services In-house Referral: Clinical Social Work     Living arrangements for the past 2 months: Independent Living Facility                                      Prior Living Arrangements/Services Living arrangements for the past 2 months: Independent Living Facility Lives with:: Facility Resident Patient language and need for interpreter reviewed:: Yes Do you feel safe going back to the place where you live?: Yes      Need for Family Participation in Patient Care: No (Comment)     Criminal Activity/Legal Involvement Pertinent to Current Situation/Hospitalization: No - Comment as needed  Activities of Daily Living   ADL Screening (condition at time of admission) Independently performs ADLs?: Yes (appropriate for developmental age) Is the patient deaf or have difficulty hearing?: No Does the patient have difficulty seeing, even when wearing glasses/contacts?: No Does the patient have difficulty concentrating, remembering, or making decisions?: No  Permission  Sought/Granted                  Emotional Assessment     Affect (typically observed): Appropriate Orientation: : Oriented to Self, Oriented to Place, Oriented to  Time, Oriented to Situation Alcohol / Substance Use: Not Applicable Psych Involvement: No (comment)  Admission diagnosis:  Hyponatremia [E87.1] Patient Active Problem List   Diagnosis Date Noted   Low back pain 11/04/2023   Thrombocytopenia (HCC) 11/04/2023   BPH (benign prostatic hyperplasia) 11/04/2023   Acquired hypothyroidism 11/04/2023   Hyponatremia 11/03/2023   Aortic atherosclerosis (HCC) 09/15/2023   Malignant neoplasm of prostate (HCC) 09/15/2023   AKI (acute kidney injury) (HCC) 06/30/2023   Enteritis due to Norovirus 06/30/2023   Diastasis recti 02/13/2022   Volvulus of jejunum (HCC)    Personal history of gastric banding    Gouty arthritis of both feet 09/18/2021   Obesity (BMI 30-39.9) 09/11/2021   Acute renal failure superimposed on stage 3a chronic kidney disease (HCC) - baseline SCr (1.2-1.4) 09/11/2021   Type 2 diabetes mellitus with hyperglycemia (HCC) 09/11/2021   Small bowel obstruction (HCC) 09/10/2021   Carpal tunnel syndrome of left wrist 10/12/2020   Carpal tunnel syndrome of right wrist 10/11/2020   Bilateral shoulder pain 06/20/2020   Anaphylactic shock due to adverse food reaction 08/22/2019   Rhinitis, chronic 02/19/2018   Bilateral hearing loss 12/25/2017   Eustachian tube dysfunction 12/25/2017  Right chronic serous otitis media 12/25/2017   Dyslipidemia 02/22/2015   Acute coronary syndrome (HCC) 02/14/2015   NSTEMI (non-ST elevated myocardial infarction) (HCC)    Chest pain 02/13/2015   OSA on CPAP 01/20/2014   Accelerating angina (HCC) 07/14/2013   Edema, lower extremity, bil 07/14/2013   Coronary artery disease, hx LAD and LCX stenting in 2001 & 2004, last cath 2009 with patent stents. 04/07/2013   Essential hypertension 04/07/2013   Mixed hyperlipidemia 04/07/2013    Diabetes (HCC) 04/07/2013   Morbid obesity (HCC) 04/07/2013   Melanoma of foot (HCC) 06/02/2011   Microscopic colitis 10/24/2010   GI bleed 10/01/2010   Acute infectious diarrhea - due to norovirus 10/01/2010   PCP:  Marvine Rush, MD Pharmacy:   [facility] [place], [place] - [address] [place] [address] Phone: [phone] Fax: [phone]     Social Drivers of Health (SDOH) Social History: SDOH Screenings   Food Insecurity: No Food Insecurity (11/03/2023)  Housing: Low Risk  (11/03/2023)  Transportation Needs: No Transportation Needs (11/03/2023)  Utilities: Not At Risk (11/03/2023)  Alcohol Screen: Low Risk  (09/15/2023)  Depression (PHQ2-9): Low Risk  (09/15/2023)  Financial Resource Strain: Low Risk  (08/05/2022)  Physical Activity: Inactive (08/05/2022)  Social Connections: Socially Isolated (11/03/2023)  Stress: No Stress Concern Present (08/05/2022)  Tobacco Use: Medium Risk (10/20/2023)   SDOH Interventions:     Readmission Risk Interventions    11/04/2023    8:21 AM 09/17/2021   11:27 AM  Readmission Risk Prevention Plan  Transportation Screening Complete Complete  Home Care Screening  Complete  Medication Review (RN CM)  Complete  HRI or Home Care Consult Complete   Social Work Consult for Recovery Care Planning/Counseling Complete   Palliative Care Screening Not Applicable   Medication Review [facility]) Complete

## 2023-11-04 NOTE — Progress Notes (Signed)
 PROGRESS NOTE   Kevin [name]  FMW:[id number] DOB: 09-10-1945 DOA: 11/03/2023 PCP: Marvine Rush, MD   Chief Complaint  Patient presents with   Flank Pain   Level of care: Stepdown  Brief Admission History:  78 y.o. male with medical history significant of hypertension, hyperlipidemia, T2DM, OSA not using CPAP, asthma, CAD s/p remote LAD and circumflex stenting  in 1999, 2001,2004, 02/24/2015 who presents to the Emergency Department via EMS from [facility] with complaint of 3-week onset of bilateral flank pain and nausea which started today.  Flank pain did not radiate to lower extremities.  He was seen in the ED on 6/17 due to dehydration was provided with IV hydration prior to being discharged.  Patient states that he has been drinking a lot of water since that ED visit due to prevent recurrent dehydration.   ED Course:  In the emergency department, he was bradycardic with HR of 59 bpm, other vital signs were within normal range.  Workup in the ED showed normocytic anemia and thrombocytopenia.  BMP showed sodium 118, potassium 3.8, chloride 89, bicarb 20, blood glucose 90, BUN 30, creatinine 1.33 CT abdomen and pelvis without contrast showed increased bilateral perinephric stranding compared to 09/03/2023. This is nonspecific but in the setting of bilateral flank pain could represent pyelonephritis. IV NS 1 L was given.  TR[facility] was asked to admit patient     Assessment and Plan:  Hyponatremia Na 118 improved to 123 this morning; this is likely multifactorial Patient states that he has been drinking a lot of water since last visit to ED due to dehydration, but he was also on diuretics (Lasix , HCTZ). Continue gentle hydration and holding diuretics temporarily, would not restart HCTZ Continue to monitor sodium with serial BMPs Urine osmolality serum osmolality and urine sodium pending   Low back pain Continue oxycodone  5 mg every 4 hours as needed   Thrombocytopenia  possibly reactive Platelets 130, Continue to monitor platelet levels   CAD Status post remote LAD and circumflex stenting in 1999, 2001,2004, 02/24/2015  ,  Patient is chest pain-free and asymptomatic Continue aspirin , Plavix , pravastatin , metoprolol    T2DM A1c on 06/25/2023 was 6.4  Hold Mounjaro, Januvia Use Novolog /Humalog Sliding scale insulin  with Accu-Cheks/Fingersticks   CBG (last 3)  Recent Labs    11/04/23 0744 11/04/23 1135 11/04/23 1615  GLUCAP 78 113* 129*    Essential hypertension Continue metoprolol   Hold Diovan  HCT   BPH Continue Flomax    Acquired hypothyroidism Continue levothyroxine   DVT prophylaxis: sq heparin  Code Status: Full  Family Communication: telephone 7/2   Disposition: anticipate return to [facility] when medically cleared    Consultants:   Procedures:   Antimicrobials:    Subjective: Pt says he feels fine today, no specific complaints but has a lot of questions about what ifs  Objective: Vitals:   11/04/23 0800 11/04/23 1100 11/04/23 1300 11/04/23 1500  BP: (!) 172/81 (!) 146/66 110/60 125/60  Pulse: 76 (!) 54 (!) 56 (!) 50  Resp: (!) 21 (!) 8 12 13   Temp:  (!) 97.5 F (36.4 C)    TempSrc:  Axillary    SpO2: 94% 100% 96% 100%  Weight:      Height:        Intake/Output Summary (Last 24 hours) at 11/04/2023 1708 Last data filed at 11/04/2023 1300 Gross per 24 hour  Intake 181.67 ml  Output 3250 ml  Net -3068.33 ml   Filed Weights   11/04/23 0345  Weight: 107.6 kg   Examination:  General exam: Appears calm and comfortable - he has very dry mucus membranes Respiratory system: Clear to auscultation. Respiratory effort normal. Cardiovascular system: normal S1 & S2 heard. No JVD, murmurs, rubs, gallops or clicks. No pedal edema. Gastrointestinal system: Abdomen is nondistended, soft and nontender. No organomegaly or masses felt. Normal bowel sounds heard. Central nervous system: Alert and oriented. No focal  neurological deficits. Extremities: Symmetric 5 x 5 power. Skin: No rashes, lesions or ulcers. Psychiatry: Judgement and insight appear normal. Mood & affect appropriate.   Data Reviewed: I have personally reviewed following labs and imaging studies  CBC: Recent Labs  Lab 11/03/23 2037 11/04/23 0513  WBC 4.6 3.9*  NEUTROABS 2.8  --   HGB 9.2* 9.7*  HCT 25.1* 27.5*  MCV 90.6 92.6  PLT 138* 130*    Basic Metabolic Panel: Recent Labs  Lab 11/03/23 2037 11/04/23 0513 11/04/23 1055  NA 118* 121* 123*  K 3.8 4.0 3.7  CL 89* 93* 92*  CO2 20* 22 22  GLUCOSE 90 89 125*  BUN 30* 28* 26*  CREATININE 1.33* 1.21 1.16  CALCIUM 8.9 9.1 9.2  MG  --  1.6*  --   PHOS  --  2.7  --     CBG: Recent Labs  Lab 11/04/23 0744 11/04/23 1135 11/04/23 1615  GLUCAP 78 113* 129*    Recent Results (from the past 240 hours)  MRSA Next Gen by PCR, Nasal     Status: None   Collection Time: 11/04/23  7:15 AM   Specimen: Nasal Mucosa; Nasal Swab  Result Value Ref Range Status   MRSA by PCR Next Gen NOT DETECTED NOT DETECTED Final    Comment: (NOTE) The GeneXpert MRSA Assay (FDA approved for NASAL specimens only), is one component of a comprehensive MRSA colonization surveillance program. It is not intended to diagnose MRSA infection nor to guide or monitor treatment for MRSA infections. Test performance is not FDA approved in patients less than 37 years old. Performed at [facility], [address] [place], [place] [address]      Radiology Studies: CT ABDOMEN PELVIS WO CONTRAST Result Date: 11/03/2023 CLINICAL DATA:  Acute nonlocalized abdominal pain and bilateral flank pain. Nausea. EXAM: CT ABDOMEN AND PELVIS WITHOUT CONTRAST TECHNIQUE: Multidetector CT imaging of the abdomen and pelvis was performed following the standard protocol without IV contrast. RADIATION DOSE REDUCTION: This exam was performed according to the departmental dose-optimization program which includes automated  exposure control, adjustment of the mA and/or kV according to patient size and/or use of iterative reconstruction technique. COMPARISON:  PET/CT 09/03/2023 and CT abdomen pelvis 10/23/2022 FINDINGS: Lower chest: No acute abnormality. Hepatobiliary: Cholelithiasis without evidence of acute cholecystitis. Unremarkable noncontrast appearance of the liver. Pancreas: Unremarkable. Spleen: Unremarkable. Adrenals/Urinary Tract: Normal adrenal glands. Slightly increased bilateral perinephric stranding compared to 09/03/2023. No urinary calculi or hydronephrosis. Unremarkable bladder. Stomach/Bowel: Normal caliber large and small bowel. No bowel wall thickening. Colonic diverticulosis without diverticulitis. Herniation of small bowel into a supraumbilical ventral abdominal wall hernia. No evidence of obstruction or strangulation. Stomach is within normal limits. The appendix is not visualized. No secondary signs of appendicitis. Vascular/Lymphatic: Aortic atherosclerosis. Unchanged size of the 1.4 cm left external iliac node which was radiotracer avid on PET/CT 09/03/2023 (series 2/image 71). Reproductive: Penile prosthesis. Enlarged prostate containing fiducial markers. Other: Perinephric stranding extending inferiorly in the anterior pararenal space. No free intraperitoneal fluid. No intraperitoneal free gas. No abscess. Musculoskeletal: No acute fracture.  IMPRESSION: 1. Increased bilateral perinephric stranding compared to 09/03/2023. This is nonspecific but in the setting of bilateral flank pain could represent pyelonephritis. 2. No urinary calculi or hydronephrosis. 3. Unchanged size of the 1.4 cm left external iliac node which was radiotracer avid on PET/CT 09/03/2023. 4. Cholelithiasis without evidence of acute cholecystitis. 5. Herniation of small bowel into a supraumbilical ventral abdominal wall hernia. No evidence of obstruction or strangulation. 6. Aortic Atherosclerosis (ICD10-I70.0). Electronically Signed   By:  Norman Gatlin M.D.   On: 11/03/2023 21:36    Scheduled Meds:  aspirin  EC  81 mg Oral Daily   Chlorhexidine  Gluconate Cloth  6 each Topical Daily   clopidogrel   75 mg Oral Daily   heparin   5,000 Units Subcutaneous Q8H   insulin  aspart  0-9 Units Subcutaneous TID WC   levothyroxine   50 mcg Oral Q0600   metoprolol  succinate  100 mg Oral Daily   niacin   1,000 mg Oral QHS   pravastatin   40 mg Oral QPM   tamsulosin   0.4 mg Oral QPM   Continuous Infusions:  sodium chloride  70 mL/hr at 11/04/23 1025     LOS: 1 day   Critical Care Procedure Note Authorized and Performed by: KYM Louder MD  Total Critical Care time:  63 mins Due to a high probability of clinically significant, life threatening deterioration, the patient required my highest level of preparedness to intervene emergently and I personally spent this critical care time directly and personally managing the patient.  This critical care time included obtaining a history; examining the patient, pulse oximetry; ordering and review of studies; arranging urgent treatment with development of a management plan; evaluation of patient's response of treatment; frequent reassessment; and discussions with other providers.  This critical care time was performed to assess and manage the high probability of imminent and life threatening deterioration that could result in multi-organ failure.  It was exclusive of separately billable procedures and treating other patients and teaching time.    Afton Louder, MD How to contact the [facility] Attending or Consulting provider 7A - 7P or covering provider during after hours 7P -7A, for this patient?  Check the care team in [facility] and look for a) attending/consulting [facility] provider listed and b) the [facility] team listed Log into [email or web address] to find provider on call.  Locate the [facility] provider you are looking for under [facility] and page to a number that you can be directly reached. If you still have difficulty  reaching the provider, please page the [facility] (Director on Call) for the Hospitalists listed on amion for assistance.  11/04/2023, 5:08 PM

## 2023-11-04 NOTE — Plan of Care (Signed)
  Problem: Education: Goal: Knowledge of General Education information will improve Description: Including pain rating scale, medication(s)/side effects and non-pharmacologic comfort measures Outcome: Progressing   Problem: Activity: Goal: Risk for activity intolerance will decrease Outcome: Progressing   Problem: Coping: Goal: Level of anxiety will decrease Outcome: Progressing   Problem: Elimination: Goal: Will not experience complications related to bowel motility Outcome: Progressing   Problem: Safety: Goal: Ability to remain free from injury will improve Outcome: Progressing   Problem: Skin Integrity: Goal: Risk for impaired skin integrity will decrease Outcome: Progressing   Problem: Coping: Goal: Ability to adjust to condition or change in health will improve Outcome: Progressing

## 2023-11-04 NOTE — Hospital Course (Signed)
 78 y.o. male with medical history significant of hypertension, hyperlipidemia, T2DM, OSA not using CPAP, asthma, CAD s/p remote LAD and circumflex stenting  in 1999, 2001,2004, 02/24/2015 who presents to the Emergency Department via EMS [facility] [place] with complaint of 3-week onset of bilateral flank pain and nausea which started today.  Flank pain did not radiate to lower extremities.  He was seen in the ED on 6/17 due to dehydration was provided with IV hydration prior to being discharged.  Patient states that he has been drinking a lot of water since that ED visit due to prevent recurrent dehydration.   ED Course:  In the emergency department, he was bradycardic with HR of 59 bpm, other vital signs were within normal range.  Workup in the ED showed normocytic anemia and thrombocytopenia.  BMP showed sodium 118, potassium 3.8, chloride 89, bicarb 20, blood glucose 90, BUN 30, creatinine 1.33 CT abdomen and pelvis without contrast showed increased bilateral perinephric stranding compared to 09/03/2023. This is nonspecific but in the setting of bilateral flank pain could represent pyelonephritis. IV NS 1 L was given.  TRH was asked to admit patient

## 2023-11-04 NOTE — Progress Notes (Deleted)
 Pt is resident at [facility] on independent living unit. He plans to return at d/c. Per Chasity at [facility], pt will not require assessment to return and can return at any time.    11/04/23 0820  TOC Brief Assessment  Insurance and Status Reviewed  Patient has primary care physician Yes  Home environment has been reviewed [facility]  Prior level of function: Independent.  Prior/Current Home Services No current home services  Social Drivers of Health Review SDOH reviewed no interventions necessary  Readmission risk has been reviewed Yes  Transition of care needs no transition of care needs at this time

## 2023-11-04 NOTE — Telephone Encounter (Signed)
 RN returned call from Kevin [name] (niece) in reference to Mr. Kevin [name].  Kevin [name], 78 y.o. gentleman with oligometastatic adenocarcinoma of the prostate with Gleason Score of 4+3, PSA of 15.2 and a solitary nodal metastasis in a left external iliac lymph node.  Kevin [name] has recently started radiation treatment on 11/02/2023.  Kevin had called to inform staff that Kevin [name] has been struggling with dehydration and that his PCP encourage him to drink a gallon of water daily it appears that he may have over did it which has resulted in the admission to [facility] for overhydration resulting in decrease Na levels.  Kevin wanted to know if indeed it was the water or was it related to radiation treatment (she read that somewhere).  RN advised her that it wasn't radiation related that he had 2 treatments and that's not enough radiation to have caused a decrease in Na levels.  Kevin [name] was worried about missing appointments since it looks like he's going to be in the hospital for few days until Na levels are back to normal.  This writer informed her not to worry he's only had 2 treatment thus far and we will check back with them on Monday to see if he's been discharged from the hospital to resume treatments.  RN informed the team of Kevin [name] admission and we will check back in couple days to see if he has been discharged to continue treatments.

## 2023-11-05 ENCOUNTER — Ambulatory Visit

## 2023-11-05 DIAGNOSIS — D696 Thrombocytopenia, unspecified: Secondary | ICD-10-CM | POA: Diagnosis not present

## 2023-11-05 DIAGNOSIS — E871 Hypo-osmolality and hyponatremia: Secondary | ICD-10-CM | POA: Diagnosis not present

## 2023-11-05 DIAGNOSIS — I10 Essential (primary) hypertension: Secondary | ICD-10-CM | POA: Diagnosis not present

## 2023-11-05 DIAGNOSIS — E039 Hypothyroidism, unspecified: Secondary | ICD-10-CM | POA: Diagnosis not present

## 2023-11-05 LAB — BASIC METABOLIC PANEL WITH GFR
Anion gap: 8 (ref 5–15)
BUN: 29 mg/dL — ABNORMAL HIGH (ref 8–23)
CO2: 24 mmol/L (ref 22–32)
Calcium: 9.3 mg/dL (ref 8.9–10.3)
Chloride: 98 mmol/L (ref 98–111)
Creatinine, Ser: 1.37 mg/dL — ABNORMAL HIGH (ref 0.61–1.24)
GFR, Estimated: 53 mL/min — ABNORMAL LOW (ref >60)
Glucose, Bld: 92 mg/dL (ref 70–99)
Potassium: 4.9 mmol/L (ref 3.5–5.1)
Sodium: 130 mmol/L — ABNORMAL LOW (ref 135–145)

## 2023-11-05 LAB — CBC
HCT: 27.7 % — ABNORMAL LOW (ref 39.0–52.0)
Hemoglobin: 9.5 g/dL — ABNORMAL LOW (ref 13.0–17.0)
MCH: 32.4 pg (ref 26.0–34.0)
MCHC: 34.3 g/dL (ref 30.0–36.0)
MCV: 94.5 fL (ref 80.0–100.0)
Platelets: 121 10*3/uL — ABNORMAL LOW (ref 150–400)
RBC: 2.93 MIL/uL — ABNORMAL LOW (ref 4.22–5.81)
RDW: 13.1 % (ref 11.5–15.5)
WBC: 3.6 10*3/uL — ABNORMAL LOW (ref 4.0–10.5)
nRBC: 0 % (ref 0.0–0.2)

## 2023-11-05 LAB — MAGNESIUM: Magnesium: 2.4 mg/dL (ref 1.7–2.4)

## 2023-11-05 LAB — GLUCOSE, CAPILLARY
Glucose-Capillary: 103 mg/dL — ABNORMAL HIGH (ref 70–99)
Glucose-Capillary: 99 mg/dL (ref 70–99)

## 2023-11-05 MED ORDER — SODIUM CHLORIDE 0.9 % IV SOLN: INTRAVENOUS | Status: DC

## 2023-11-05 NOTE — Progress Notes (Signed)
 Patient has discharged. All belongings have been returned. Patient does not have any further questions.

## 2023-11-05 NOTE — Discharge Instructions (Addendum)
 PLEASE STOP TAKING LASIX  UNTIL YOU CAN FOLLOW UP WITH YOUR DOCTOR NEXT WEEK TO HAVE REPEAT LABS DONE AND YOUR PRIMARY CARE PROVIDER RESTARTS IT.    PLEASE HAVE YOUR SODIUM RECHECKED NEXT WEEK   IMPORTANT INFORMATION: PAY CLOSE ATTENTION   PHYSICIAN DISCHARGE INSTRUCTIONS  Follow with Primary care provider  Marvine Rush, MD  and other consultants as instructed by your Hospitalist Physician  SEEK MEDICAL CARE OR RETURN TO EMERGENCY ROOM IF SYMPTOMS COME BACK, WORSEN OR NEW PROBLEM DEVELOPS   Please note: You were cared for by a hospitalist during your hospital stay. Every effort will be made to forward records to your primary care provider.  You can request that your primary care provider send for your hospital records if they have not received them.  Once you are discharged, your primary care physician will handle any further medical issues. Please note that NO REFILLS for any discharge medications will be authorized once you are discharged, as it is imperative that you return to your primary care physician (or establish a relationship with a primary care physician if you do not have one) for your post hospital discharge needs so that they can reassess your need for medications and monitor your lab values.  Please get a complete blood count and chemistry panel checked by your Primary MD at your next visit, and again as instructed by your Primary MD.  Get Medicines reviewed and adjusted: Please take all your medications with you for your next visit with your Primary MD  Laboratory/radiological data: Please request your Primary MD to go over all hospital tests and procedure/radiological results at the follow up, please ask your primary care provider to get all Hospital records sent to his/her office.  In some cases, they will be blood work, cultures and biopsy results pending at the time of your discharge. Please request that your primary care provider follow up on these results.  If you are  diabetic, please bring your blood sugar readings with you to your follow up appointment with primary care.    Please call and make your follow up appointments as soon as possible.    Also Note the following: If you experience worsening of your admission symptoms, develop shortness of breath, life threatening emergency, suicidal or homicidal thoughts you must seek medical attention immediately by calling 911 or calling your MD immediately  if symptoms less severe.  You must read complete instructions/literature along with all the possible adverse reactions/side effects for all the Medicines you take and that have been prescribed to you. Take any new Medicines after you have completely understood and accpet all the possible adverse reactions/side effects.   Do not drive when taking Pain medications or sleeping medications (Benzodiazepines)  Do not take more than prescribed Pain, Sleep and Anxiety Medications. It is not advisable to combine anxiety,sleep and pain medications without talking with your primary care practitioner  Special Instructions: If you have smoked or chewed Tobacco  in the last 2 yrs please stop smoking, stop any regular Alcohol  and or any Recreational drug use.  Wear Seat belts while driving.  Do not drive if taking any narcotic, mind altering or controlled substances or recreational drugs or alcohol.

## 2023-11-05 NOTE — Discharge Summary (Signed)
 Physician Discharge Summary  Kevin [name] FMW:[id number] DOB: June 23, 1945 DOA: 11/03/2023  PCP: Marvine Rush, MD  Admit date: 11/03/2023 Discharge date: 11/05/2023  Admitted From:  HOME ([facility] Disposition: HOME ([facility]F   Recommendations for Outpatient Follow-up:  Follow up with PCP in 1 weeks Please obtain BMP/CBC in 1 week Please hold all diuretics until follow up with PCP for repeat labs  Discharge Condition: STABLE   CODE STATUS: FULL DIET: Heart Healthy Foods   Brief Hospitalization Summary: Please see all hospital notes, images, labs for full details of the hospitalization. Admission provider HPI:  78 y.o. male with medical history significant of hypertension, hyperlipidemia, T2DM, OSA not using CPAP, asthma, CAD s/p remote LAD and circumflex stenting  in 1999, 2001,2004, 02/24/2015 who presents to the Emergency Department via [facility] [place] with complaint of 3-week onset of bilateral flank pain and nausea which started today.  Flank pain did not radiate to lower extremities.  He was seen in the ED on 6/17 due to dehydration was provided with IV hydration prior to being discharged.  Patient states that he has been drinking a lot of water since that ED visit due to prevent recurrent dehydration.   ED Course:  In the emergency department, he was bradycardic with HR of 59 bpm, other vital signs were within normal range.  Workup in the ED showed normocytic anemia and thrombocytopenia.  BMP showed sodium 118, potassium 3.8, chloride 89, bicarb 20, blood glucose 90, BUN 30, creatinine 1.33 CT abdomen and pelvis without contrast showed increased bilateral perinephric stranding compared to 09/03/2023. This is nonspecific but in the setting of bilateral flank pain could represent pyelonephritis. IV NS 1 L was given.  TRH was asked to admit patient   HOSPITAL COURSE BY LISTED PROBLEMS ADDRESSED  Hyponatremia Na 118 improved to 130 this morning; this is  likely multifactorial Patient states that he has been drinking a lot of water since last visit to ED due to dehydration, but he was also on diuretics (Lasix , HCTZ). Pt was treated with gentle IV fluid hydration and held all diuretics monitored sodium with serial BMPs Pt was advised to hold all diuretics until he follows up with PCP next week for repeat labs Pt feels well to go home today as sodium up to 130 and he is agreeable to close outpatient follow up   Low back pain Continue oxycodone  5 mg every 4 hours as needed   Thrombocytopenia possibly reactive Platelets 130, Continue to monitor platelet levels outpatient with PCP   CAD Status post remote LAD and circumflex stenting in 1999, 2001,2004, 02/24/2015  ,  Patient is chest pain-free and asymptomatic Continue aspirin , Plavix , pravastatin , metoprolol    T2DM A1c on 06/25/2023 was 6.4  Held Mounjaro, Januvia in hospital  Use Novolog /Humalog Sliding scale insulin  with Accu-Cheks/Fingersticks  CBG (last 3)  Recent Labs    11/04/23 1615 11/04/23 2123 11/05/23 0752  GLUCAP 129* 118* 103*    Essential hypertension Continue metoprolol   Hold Diovan  HCT   BPH Continue Flomax    Acquired hypothyroidism Continue levothyroxine     Discharge Diagnoses:  Principal Problem:   Hyponatremia Active Problems:   Coronary artery disease, hx LAD and LCX stenting in 2001 & 2004, last cath 2009 with patent stents.   Essential hypertension   Low back pain   Thrombocytopenia (HCC)   BPH (benign prostatic hyperplasia)   Acquired hypothyroidism   Discharge Instructions:  Allergies as of 11/05/2023  Reactions   Irbesartan Shortness Of Breath, Other (See Comments)   Prednisone  Anaphylaxis, Swelling, Other (See Comments)   Swelling of the lips   Ramipril Shortness Of Breath, Other (See Comments)   Shellfish Allergy  Anaphylaxis   ALL SHELLFISH  Shrimp/  crab   Tradjenta [linagliptin] Shortness Of Breath   Cephalexin Hives,  Swelling   Clindamycin Hcl Hives, Swelling   Codeine Nausea And Vomiting, Other (See Comments)   Affects heart   Contrast Media [iodinated Contrast Media] Other (See Comments)   Very sick  Can be administered if Benadryl  is administered prior to prevent reaction    Fenofibrate Other (See Comments)   My skin started peeling off   Glycotrol [support-500] Other (See Comments)   Unknown    Indomethacin Other (See Comments)   Unknown    Minocycline Hcl Hives, Swelling   Morphine And Codeine Nausea And Vomiting   Projectile vomiting   Sulfa Drugs Cross Reactors Hives, Swelling   Tetracyclines & Related Other (See Comments)   When exposed to sun, skin turned bluish        Medication List     STOP taking these medications    furosemide  40 MG tablet Commonly known as: LASIX    Mounjaro 2.5 MG/0.5ML Pen Generic drug: tirzepatide   valsartan -hydrochlorothiazide  160-12.5 MG tablet Commonly known as: DIOVAN -HCT       TAKE these medications    aspirin  81 MG tablet Take 81 mg by mouth daily.   b complex vitamins capsule Take 1 capsule by mouth daily.   clopidogrel  75 MG tablet Commonly known as: PLAVIX  TAKE ONE TABLET BY MOUTH ONCE DAILY.   CoQ-10 100 MG Caps Take 1 capsule by mouth daily.   diphenhydrAMINE  25 MG tablet Commonly known as: BENADRYL  Take 25 mg by mouth every 6 (six) hours as needed for itching or allergies.   EYE ALLERGY  RELIEF OP Place 2 drops into both eyes as needed.   Fish Oil-Flax Oil-Borage Oil Caps Take 3,000 mg by mouth every morning.   fluticasone  50 MCG/ACT nasal spray Commonly known as: FLONASE  Place 2 sprays into both nostrils daily.   Januvia 100 MG tablet Generic drug: sitaGLIPtin Take 100 mg by mouth daily.   levothyroxine  50 MCG tablet Commonly known as: SYNTHROID  Take 50 mcg by mouth daily before breakfast.   metoprolol  succinate 100 MG 24 hr tablet Commonly known as: TOPROL -XL Take 100 mg by mouth daily.   niacin   1000 MG CR tablet Commonly known as: NIASPAN  TAKE 1 TABLET BY MOUTH AT BEDTIME.   oxyCODONE  5 MG immediate release tablet Commonly known as: Roxicodone  Take 1 tablet (5 mg total) by mouth every 6 (six) hours as needed for up to 2 doses for severe pain (pain score 7-10).   pravastatin  40 MG tablet Commonly known as: PRAVACHOL  Take 1 tablet (40 mg total) by mouth every evening.   tamsulosin  0.4 MG Caps capsule Commonly known as: FLOMAX  Take 0.4 mg by mouth every evening.   Turmeric Curcumin Caps Take 3 capsules by mouth every morning. 2400 mg/ 3000 mg = total   vitamin C 1000 MG tablet Take 1,000 mg by mouth daily.   Vitamin D3 25 MCG Caps Take 1 capsule by mouth every other day.        Follow-up Information     Marvine Rush, MD. Schedule an appointment as soon as possible for a visit in 1 week(s).   Specialty: Family Medicine Why: Hospital Follow Up Contact information: [address] [place]  [address] [phone]                Allergies  Allergen Reactions   Irbesartan Shortness Of Breath and Other (See Comments)   Prednisone  Anaphylaxis, Swelling and Other (See Comments)    Swelling of the lips   Ramipril Shortness Of Breath and Other (See Comments)   Shellfish Allergy  Anaphylaxis    ALL SHELLFISH  Shrimp/  crab   Tradjenta [Linagliptin] Shortness Of Breath   Cephalexin Hives and Swelling   Clindamycin Hcl Hives and Swelling   Codeine Nausea And Vomiting and Other (See Comments)    Affects heart   Contrast Media [Iodinated Contrast Media] Other (See Comments)    Very sick  Can be administered if Benadryl  is administered prior to prevent reaction    Fenofibrate Other (See Comments)    My skin started peeling off   Glycotrol [Support-500] Other (See Comments)    Unknown    Indomethacin Other (See Comments)    Unknown    Minocycline Hcl Hives and Swelling   Morphine And Codeine Nausea And Vomiting    Projectile vomiting   Sulfa  Drugs Cross Reactors Hives and Swelling   Tetracyclines & Related Other (See Comments)    When exposed to sun, skin turned bluish   Allergies as of 11/05/2023       Reactions   Irbesartan Shortness Of Breath, Other (See Comments)   Prednisone  Anaphylaxis, Swelling, Other (See Comments)   Swelling of the lips   Ramipril Shortness Of Breath, Other (See Comments)   Shellfish Allergy  Anaphylaxis   ALL SHELLFISH  Shrimp/  crab   Tradjenta [linagliptin] Shortness Of Breath   Cephalexin Hives, Swelling   Clindamycin Hcl Hives, Swelling   Codeine Nausea And Vomiting, Other (See Comments)   Affects heart   Contrast Media [iodinated Contrast Media] Other (See Comments)   Very sick  Can be administered if Benadryl  is administered prior to prevent reaction    Fenofibrate Other (See Comments)   My skin started peeling off   Glycotrol [support-500] Other (See Comments)   Unknown    Indomethacin Other (See Comments)   Unknown    Minocycline Hcl Hives, Swelling   Morphine And Codeine Nausea And Vomiting   Projectile vomiting   Sulfa Drugs Cross Reactors Hives, Swelling   Tetracyclines & Related Other (See Comments)   When exposed to sun, skin turned bluish        Medication List     STOP taking these medications    furosemide  40 MG tablet Commonly known as: LASIX    Mounjaro 2.5 MG/0.5ML Pen Generic drug: tirzepatide   valsartan -hydrochlorothiazide  160-12.5 MG tablet Commonly known as: DIOVAN -HCT       TAKE these medications    aspirin  81 MG tablet Take 81 mg by mouth daily.   b complex vitamins capsule Take 1 capsule by mouth daily.   clopidogrel  75 MG tablet Commonly known as: PLAVIX  TAKE ONE TABLET BY MOUTH ONCE DAILY.   CoQ-10 100 MG Caps Take 1 capsule by mouth daily.   diphenhydrAMINE  25 MG tablet Commonly known as: BENADRYL  Take 25 mg by mouth every 6 (six) hours as needed for itching or allergies.   EYE ALLERGY  RELIEF OP Place 2 drops into both  eyes as needed.   Fish Oil-Flax Oil-Borage Oil Caps Take 3,000 mg by mouth every morning.   fluticasone  50 MCG/ACT nasal spray Commonly known as: FLONASE  Place 2 sprays into both nostrils daily.   Januvia 100 MG tablet  Generic drug: sitaGLIPtin Take 100 mg by mouth daily.   levothyroxine  50 MCG tablet Commonly known as: SYNTHROID  Take 50 mcg by mouth daily before breakfast.   metoprolol  succinate 100 MG 24 hr tablet Commonly known as: TOPROL -XL Take 100 mg by mouth daily.   niacin  1000 MG CR tablet Commonly known as: NIASPAN  TAKE 1 TABLET BY MOUTH AT BEDTIME.   oxyCODONE  5 MG immediate release tablet Commonly known as: Roxicodone  Take 1 tablet (5 mg total) by mouth every 6 (six) hours as needed for up to 2 doses for severe pain (pain score 7-10).   pravastatin  40 MG tablet Commonly known as: PRAVACHOL  Take 1 tablet (40 mg total) by mouth every evening.   tamsulosin  0.4 MG Caps capsule Commonly known as: FLOMAX  Take 0.4 mg by mouth every evening.   Turmeric Curcumin Caps Take 3 capsules by mouth every morning. 2400 mg/ 3000 mg = total   vitamin C 1000 MG tablet Take 1,000 mg by mouth daily.   Vitamin D3 25 MCG Caps Take 1 capsule by mouth every other day.        Procedures/Studies: CT ABDOMEN PELVIS WO CONTRAST Result Date: 11/03/2023 CLINICAL DATA:  Acute nonlocalized abdominal pain and bilateral flank pain. Nausea. EXAM: CT ABDOMEN AND PELVIS WITHOUT CONTRAST TECHNIQUE: Multidetector CT imaging of the abdomen and pelvis was performed following the standard protocol without IV contrast. RADIATION DOSE REDUCTION: This exam was performed according to the departmental dose-optimization program which includes automated exposure control, adjustment of the mA and/or kV according to patient size and/or use of iterative reconstruction technique. COMPARISON:  PET/CT 09/03/2023 and CT abdomen pelvis 10/23/2022 FINDINGS: Lower chest: No acute abnormality. Hepatobiliary:  Cholelithiasis without evidence of acute cholecystitis. Unremarkable noncontrast appearance of the liver. Pancreas: Unremarkable. Spleen: Unremarkable. Adrenals/Urinary Tract: Normal adrenal glands. Slightly increased bilateral perinephric stranding compared to 09/03/2023. No urinary calculi or hydronephrosis. Unremarkable bladder. Stomach/Bowel: Normal caliber large and small bowel. No bowel wall thickening. Colonic diverticulosis without diverticulitis. Herniation of small bowel into a supraumbilical ventral abdominal wall hernia. No evidence of obstruction or strangulation. Stomach is within normal limits. The appendix is not visualized. No secondary signs of appendicitis. Vascular/Lymphatic: Aortic atherosclerosis. Unchanged size of the 1.4 cm left external iliac node which was radiotracer avid on PET/CT 09/03/2023 (series 2/image 71). Reproductive: Penile prosthesis. Enlarged prostate containing fiducial markers. Other: Perinephric stranding extending inferiorly in the anterior pararenal space. No free intraperitoneal fluid. No intraperitoneal free gas. No abscess. Musculoskeletal: No acute fracture. IMPRESSION: 1. Increased bilateral perinephric stranding compared to 09/03/2023. This is nonspecific but in the setting of bilateral flank pain could represent pyelonephritis. 2. No urinary calculi or hydronephrosis. 3. Unchanged size of the 1.4 cm left external iliac node which was radiotracer avid on PET/CT 09/03/2023. 4. Cholelithiasis without evidence of acute cholecystitis. 5. Herniation of small bowel into a supraumbilical ventral abdominal wall hernia. No evidence of obstruction or strangulation. 6. Aortic Atherosclerosis (ICD10-I70.0). Electronically Signed   By: Norman Gatlin M.D.   On: 11/03/2023 21:36   DG Chest Portable 1 View Result Date: 10/20/2023 CLINICAL DATA:  Shortness of breath. EXAM: PORTABLE CHEST 1 VIEW COMPARISON:  PET-CT dated 09/03/2023. Chest radiograph dated 03/05/2023. FINDINGS:  The heart size and mediastinal contours are unchanged. Minimal bibasilar scarring/atelectasis. No focal consolidation, pleural effusion, or pneumothorax. No acute osseous abnormality. IMPRESSION: Minimal bibasilar scarring/atelectasis. Otherwise, no acute cardiopulmonary findings. Electronically Signed   By: Harrietta Sherry M.D.   On: 10/20/2023 17:31     Subjective: Pt  reports he is feeling much better today.  He is eating and drinking well.  He has been ambulating in room.   Discharge Exam: Vitals:   11/05/23 0750 11/05/23 0913  BP:  139/65  Pulse:  63  Resp:    Temp: 97.8 F (36.6 C)   SpO2:     Vitals:   11/05/23 0523 11/05/23 0600 11/05/23 0750 11/05/23 0913  BP:  (!) 137/54  139/65  Pulse:  (!) 55  63  Resp:  14    Temp: 97.7 F (36.5 C)  97.8 F (36.6 C)   TempSrc: Oral  Axillary   SpO2:  99%    Weight: 107.6 kg     Height:       General: Pt is alert, awake, not in acute distress Cardiovascular: normal S1/S2 +, no rubs, no gallops Respiratory: CTA bilaterally, no wheezing, no rhonchi Abdominal: Soft, NT, ND, bowel sounds + Extremities: no edema, no cyanosis   The results of significant diagnostics from this hospitalization (including imaging, microbiology, ancillary and laboratory) are listed below for reference.     Microbiology: Recent Results (from the past 240 hours)  MRSA Next Gen by PCR, Nasal     Status: None   Collection Time: 11/04/23  7:15 AM   Specimen: Nasal Mucosa; Nasal Swab  Result Value Ref Range Status   MRSA by PCR Next Gen NOT DETECTED NOT DETECTED Final    Comment: (NOTE) The GeneXpert MRSA Assay (FDA approved for NASAL specimens only), is one component of a comprehensive MRSA colonization surveillance program. It is not intended to diagnose MRSA infection nor to guide or monitor treatment for MRSA infections. Test performance is not FDA approved in patients less than 82 years old. Performed at [facility], [address]  [place], [place] [address]      Labs: BNP (last 3 results) No results for input(s): BNP in the last 8760 hours. Basic Metabolic Panel: Recent Labs  Lab 11/03/23 2037 11/04/23 0513 11/04/23 1055 11/04/23 1744 11/05/23 0437  NA 118* 121* 123* 125* 130*  K 3.8 4.0 3.7 4.3 4.9  CL 89* 93* 92* 95* 98  CO2 20* 22 22 23 24   GLUCOSE 90 89 125* 191* 92  BUN 30* 28* 26* 27* 29*  CREATININE 1.33* 1.21 1.16 1.24 1.37*  CALCIUM 8.9 9.1 9.2 9.0 9.3  MG  --  1.6*  --   --  2.4  PHOS  --  2.7  --   --   --    Liver Function Tests: Recent Labs  Lab 11/03/23 2037 11/04/23 0513  AST 14* 14*  ALT 12 12  ALKPHOS 30* 31*  BILITOT 1.1 0.8  PROT 6.0* 6.4*  ALBUMIN  3.7 3.7   No results for input(s): LIPASE, AMYLASE in the last 168 hours. No results for input(s): AMMONIA in the last 168 hours. CBC: Recent Labs  Lab 11/03/23 2037 11/04/23 0513 11/05/23 0437  WBC 4.6 3.9* 3.6*  NEUTROABS 2.8  --   --   HGB 9.2* 9.7* 9.5*  HCT 25.1* 27.5* 27.7*  MCV 90.6 92.6 94.5  PLT 138* 130* 121*   Cardiac Enzymes: No results for input(s): CKTOTAL, CKMB, CKMBINDEX, TROPONINI in the last 168 hours. BNP: Invalid input(s): POCBNP CBG: Recent Labs  Lab 11/04/23 0744 11/04/23 1135 11/04/23 1615 11/04/23 2123 11/05/23 0752  GLUCAP 78 113* 129* 118* 103*   D-Dimer No results for input(s): DDIMER in the last 72 hours. Hgb A1c No results for input(s): HGBA1C in the  last 72 hours. Lipid Profile No results for input(s): CHOL, HDL, LDLCALC, TRIG, CHOLHDL, LDLDIRECT in the last 72 hours. Thyroid  function studies No results for input(s): TSH, T4TOTAL, T3FREE, THYROIDAB in the last 72 hours.  Invalid input(s): FREET3 Anemia work up No results for input(s): VITAMINB12, FOLATE, FERRITIN, TIBC, IRON, RETICCTPCT in the last 72 hours. Urinalysis    Component Value Date/Time   COLORURINE COLORLESS (A) 11/03/2023 2100   APPEARANCEUR CLEAR  11/03/2023 2100   LABSPEC 1.004 (L) 11/03/2023 2100   PHURINE 6.0 11/03/2023 2100   GLUCOSEU NEGATIVE 11/03/2023 2100   HGBUR NEGATIVE 11/03/2023 2100   BILIRUBINUR NEGATIVE 11/03/2023 2100   KETONESUR NEGATIVE 11/03/2023 2100   PROTEINUR NEGATIVE 11/03/2023 2100   UROBILINOGEN 0.2 10/01/2010 1544   NITRITE NEGATIVE 11/03/2023 2100   LEUKOCYTESUR NEGATIVE 11/03/2023 2100   Sepsis Labs Recent Labs  Lab 11/03/23 2037 11/04/23 0513 11/05/23 0437  WBC 4.6 3.9* 3.6*   Microbiology Recent Results (from the past 240 hours)  MRSA Next Gen by PCR, Nasal     Status: None   Collection Time: 11/04/23  7:15 AM   Specimen: Nasal Mucosa; Nasal Swab  Result Value Ref Range Status   MRSA by PCR Next Gen NOT DETECTED NOT DETECTED Final    Comment: (NOTE) The GeneXpert MRSA Assay (FDA approved for NASAL specimens only), is one component of a comprehensive MRSA colonization surveillance program. It is not intended to diagnose MRSA infection nor to guide or monitor treatment for MRSA infections. Test performance is not FDA approved in patients less than 45 years old. Performed at [facility], [address] [place], [place] [address]    Time coordinating discharge:  36 mins  SIGNED:  Afton Louder, MD  [facility] 11/05/2023, 10:46 AM How to contact the [facility] Attending or Consulting provider 7A - 7P or covering provider during after hours 7P -7A, for this patient?  Check the care team in [facility] and look for a) attending/consulting TR[facility] provider listed and b) the TRH team listed Log into [email or web address] and use [place]'s universal password to access. If you do not have the password, please contact the hospital operator. Locate the TR[facility] provider you are looking for under [facility] and page to a number that you can be directly reached. If you still have difficulty reaching the provider, please page the [facility] (Director on Call) for the Hospitalists listed on amion for assistance.

## 2023-11-05 NOTE — Plan of Care (Signed)
  Problem: Education: Goal: Knowledge of General Education information will improve Description: Including pain rating scale, medication(s)/side effects and non-pharmacologic comfort measures Outcome: Progressing   Problem: Activity: Goal: Risk for activity intolerance will decrease Outcome: Progressing   Problem: Nutrition: Goal: Adequate nutrition will be maintained Outcome: Progressing   Problem: Coping: Goal: Level of anxiety will decrease Outcome: Progressing   Problem: Elimination: Goal: Will not experience complications related to urinary retention Outcome: Progressing   Problem: Safety: Goal: Ability to remain free from injury will improve Outcome: Progressing

## 2023-11-05 NOTE — TOC Transition Note (Signed)
 Transition of Care [facility]) - Discharge Note  Patient Details  Name: SLOAN TAKAGI MRN: [id number] Date of Birth: Feb 14, 1946  Transition of Care [facility]) CM/SW Contact:  Nena LITTIE Coffee, RN Phone Number: 11/05/2023, 12:25 PM  Clinical Narrative:    Pt to discharge to [facility] independent living today. A friend will provide transportation. TOC signing off.   Barriers to Discharge: Continued Medical Work up  Patient Goals and CMS Choice Patient states their goals for this hospitalization and ongoing recovery are:: return to [facility]   Choice offered to / list presented to : Patient [place] ownership interest in [facility].provided to::  (n/a)   Discharge Placement   Discharge Plan and Services Additional resources added to the After Visit Summary for   In-house Referral: Clinical Social Work   Social Drivers of Health (SDOH) Interventions SDOH Screenings   Food Insecurity: No Food Insecurity (11/03/2023)  Housing: Low Risk  (11/03/2023)  Transportation Needs: No Transportation Needs (11/03/2023)  Utilities: Not At Risk (11/03/2023)  Alcohol Screen: Low Risk  (09/15/2023)  Depression (PHQ2-9): Low Risk  (09/15/2023)  Financial Resource Strain: Low Risk  (08/05/2022)  Physical Activity: Inactive (08/05/2022)  Social Connections: Socially Isolated (11/03/2023)  Stress: No Stress Concern Present (08/05/2022)  Tobacco Use: Medium Risk (10/20/2023)   Readmission Risk Interventions    11/04/2023    8:21 AM 09/17/2021   11:27 AM  Readmission Risk Prevention Plan  Transportation Screening Complete Complete  Home Care Screening  Complete  Medication Review (RN CM)  Complete  HRI or Home Care Consult Complete   Social Work Consult for Recovery Care Planning/Counseling Complete   Palliative Care Screening Not Applicable   Medication Review [employer]) Complete

## 2023-11-05 NOTE — Progress Notes (Signed)
 Patients wallet and keys have been returned to the patient from security.

## 2023-11-05 NOTE — Plan of Care (Signed)
  Problem: Education: Goal: Knowledge of General Education information will improve Description: Including pain rating scale, medication(s)/side effects and non-pharmacologic comfort measures Outcome: Progressing   Problem: Nutrition: Goal: Adequate nutrition will be maintained Outcome: Progressing   Problem: Coping: Goal: Level of anxiety will decrease Outcome: Progressing   Problem: Elimination: Goal: Will not experience complications related to bowel motility Outcome: Progressing Goal: Will not experience complications related to urinary retention Outcome: Progressing   Problem: Pain Managment: Goal: General experience of comfort will improve and/or be controlled Outcome: Progressing

## 2023-11-05 NOTE — Care Management Important Message (Signed)
 Important Message  Patient Details  Name: [name] MRN: [id number] Date of Birth: [birth year]   Important Message Given:  Yes - Medicare IM     [name] [name] 11/05/2023, 11:00 AM

## 2023-11-09 ENCOUNTER — Other Ambulatory Visit: Payer: Self-pay

## 2023-11-09 ENCOUNTER — Ambulatory Visit
Admission: RE | Admit: 2023-11-09 | Discharge: 2023-11-09 | Disposition: A | Discharge status: Home / Self Care | Source: Ambulatory Visit | Attending: Radiation Oncology

## 2023-11-09 ENCOUNTER — Telehealth: Payer: Self-pay

## 2023-11-09 DIAGNOSIS — Z191 Hormone sensitive malignancy status: Secondary | ICD-10-CM | POA: Diagnosis not present

## 2023-11-09 DIAGNOSIS — Z510 Encounter for antineoplastic radiation therapy: Secondary | ICD-10-CM | POA: Diagnosis not present

## 2023-11-09 DIAGNOSIS — E871 Hypo-osmolality and hyponatremia: Secondary | ICD-10-CM | POA: Diagnosis not present

## 2023-11-09 DIAGNOSIS — R300 Dysuria: Secondary | ICD-10-CM | POA: Diagnosis not present

## 2023-11-09 DIAGNOSIS — C61 Malignant neoplasm of prostate: Secondary | ICD-10-CM | POA: Diagnosis not present

## 2023-11-09 LAB — RAD ONC ARIA SESSION SUMMARY
Course Elapsed Days: 7
Plan Fractions Treated to Date: 3
Plan Prescribed Dose Per Fraction: 2.5 Gy
Plan Total Fractions Prescribed: 28
Plan Total Prescribed Dose: 70 Gy
Reference Point Dosage Given to Date: 7.5 Gy
Reference Point Session Dosage Given: 2.5 Gy
Session Number: 3

## 2023-11-09 NOTE — Telephone Encounter (Signed)
 RN called Rosaline Lincoln (niece) to see if Mr. Venable is coming in for radiation treatment this afternoon.  Mr. Carstens was admitted 11/03/2023 for Hyponatremia from drinking too much water and reported he wouldn't be in for treatment last week..  Left voicemail to return call.

## 2023-11-10 ENCOUNTER — Other Ambulatory Visit: Payer: Self-pay

## 2023-11-10 ENCOUNTER — Ambulatory Visit
Admission: RE | Admit: 2023-11-10 | Discharge: 2023-11-10 | Discharge status: Home / Self Care | Source: Ambulatory Visit | Attending: Radiation Oncology

## 2023-11-10 ENCOUNTER — Ambulatory Visit
Admission: RE | Admit: 2023-11-10 | Discharge: 2023-11-10 | Disposition: A | Discharge status: Home / Self Care | Source: Ambulatory Visit | Attending: Radiation Oncology

## 2023-11-10 DIAGNOSIS — Z510 Encounter for antineoplastic radiation therapy: Secondary | ICD-10-CM | POA: Diagnosis not present

## 2023-11-10 DIAGNOSIS — R300 Dysuria: Secondary | ICD-10-CM | POA: Diagnosis not present

## 2023-11-10 DIAGNOSIS — E871 Hypo-osmolality and hyponatremia: Secondary | ICD-10-CM

## 2023-11-10 DIAGNOSIS — Z191 Hormone sensitive malignancy status: Secondary | ICD-10-CM | POA: Diagnosis not present

## 2023-11-10 DIAGNOSIS — C61 Malignant neoplasm of prostate: Secondary | ICD-10-CM | POA: Diagnosis not present

## 2023-11-10 LAB — RAD ONC ARIA SESSION SUMMARY
Course Elapsed Days: 8
Plan Fractions Treated to Date: 4
Plan Prescribed Dose Per Fraction: 2.5 Gy
Plan Total Fractions Prescribed: 28
Plan Total Prescribed Dose: 70 Gy
Reference Point Dosage Given to Date: 10 Gy
Reference Point Session Dosage Given: 2.5 Gy
Session Number: 4

## 2023-11-11 ENCOUNTER — Other Ambulatory Visit: Payer: Self-pay

## 2023-11-11 ENCOUNTER — Ambulatory Visit
Admission: RE | Admit: 2023-11-11 | Discharge: 2023-11-11 | Disposition: A | Discharge status: Home / Self Care | Source: Ambulatory Visit | Attending: Radiation Oncology | Admitting: Radiation Oncology

## 2023-11-11 DIAGNOSIS — E871 Hypo-osmolality and hyponatremia: Secondary | ICD-10-CM | POA: Diagnosis not present

## 2023-11-11 DIAGNOSIS — Z191 Hormone sensitive malignancy status: Secondary | ICD-10-CM | POA: Diagnosis not present

## 2023-11-11 DIAGNOSIS — C61 Malignant neoplasm of prostate: Secondary | ICD-10-CM | POA: Diagnosis not present

## 2023-11-11 DIAGNOSIS — R300 Dysuria: Secondary | ICD-10-CM | POA: Diagnosis not present

## 2023-11-11 DIAGNOSIS — Z510 Encounter for antineoplastic radiation therapy: Secondary | ICD-10-CM | POA: Diagnosis not present

## 2023-11-11 LAB — RAD ONC ARIA SESSION SUMMARY
Course Elapsed Days: 9
Plan Fractions Treated to Date: 5
Plan Prescribed Dose Per Fraction: 2.5 Gy
Plan Total Fractions Prescribed: 28
Plan Total Prescribed Dose: 70 Gy
Reference Point Dosage Given to Date: 12.5 Gy
Reference Point Session Dosage Given: 2.5 Gy
Session Number: 5

## 2023-11-11 LAB — BASIC METABOLIC PANEL WITH GFR
Anion gap: 6 (ref 5–15)
BUN: 26 mg/dL — ABNORMAL HIGH (ref 8–23)
CO2: 23 mmol/L (ref 22–32)
Calcium: 9.3 mg/dL (ref 8.9–10.3)
Chloride: 102 mmol/L (ref 98–111)
Creatinine, Ser: 1.45 mg/dL — ABNORMAL HIGH (ref 0.61–1.24)
GFR, Estimated: 50 mL/min — ABNORMAL LOW (ref >60)
Glucose, Bld: 96 mg/dL (ref 70–99)
Potassium: 5.2 mmol/L — ABNORMAL HIGH (ref 3.5–5.1)
Sodium: 131 mmol/L — ABNORMAL LOW (ref 135–145)

## 2023-11-12 ENCOUNTER — Ambulatory Visit
Admission: RE | Admit: 2023-11-12 | Discharge: 2023-11-12 | Disposition: A | Discharge status: Home / Self Care | Source: Ambulatory Visit | Attending: Radiation Oncology | Admitting: Radiation Oncology

## 2023-11-12 ENCOUNTER — Other Ambulatory Visit: Payer: Self-pay

## 2023-11-12 DIAGNOSIS — Z510 Encounter for antineoplastic radiation therapy: Secondary | ICD-10-CM | POA: Diagnosis not present

## 2023-11-12 DIAGNOSIS — E871 Hypo-osmolality and hyponatremia: Secondary | ICD-10-CM | POA: Diagnosis not present

## 2023-11-12 DIAGNOSIS — Z191 Hormone sensitive malignancy status: Secondary | ICD-10-CM | POA: Diagnosis not present

## 2023-11-12 DIAGNOSIS — C61 Malignant neoplasm of prostate: Secondary | ICD-10-CM | POA: Diagnosis not present

## 2023-11-12 DIAGNOSIS — R300 Dysuria: Secondary | ICD-10-CM | POA: Diagnosis not present

## 2023-11-12 LAB — RAD ONC ARIA SESSION SUMMARY
Course Elapsed Days: 10
Plan Fractions Treated to Date: 6
Plan Prescribed Dose Per Fraction: 2.5 Gy
Plan Total Fractions Prescribed: 28
Plan Total Prescribed Dose: 70 Gy
Reference Point Dosage Given to Date: 15 Gy
Reference Point Session Dosage Given: 2.5 Gy
Session Number: 6

## 2023-11-13 ENCOUNTER — Ambulatory Visit
Admission: RE | Admit: 2023-11-13 | Discharge: 2023-11-13 | Disposition: A | Discharge status: Home / Self Care | Source: Ambulatory Visit | Attending: Radiation Oncology

## 2023-11-13 ENCOUNTER — Other Ambulatory Visit: Payer: Self-pay

## 2023-11-13 DIAGNOSIS — C61 Malignant neoplasm of prostate: Secondary | ICD-10-CM | POA: Diagnosis not present

## 2023-11-13 DIAGNOSIS — R300 Dysuria: Secondary | ICD-10-CM | POA: Diagnosis not present

## 2023-11-13 DIAGNOSIS — Z510 Encounter for antineoplastic radiation therapy: Secondary | ICD-10-CM | POA: Diagnosis not present

## 2023-11-13 DIAGNOSIS — Z191 Hormone sensitive malignancy status: Secondary | ICD-10-CM | POA: Diagnosis not present

## 2023-11-13 DIAGNOSIS — E871 Hypo-osmolality and hyponatremia: Secondary | ICD-10-CM | POA: Diagnosis not present

## 2023-11-13 LAB — RAD ONC ARIA SESSION SUMMARY
Course Elapsed Days: 11
Plan Fractions Treated to Date: 7
Plan Prescribed Dose Per Fraction: 2.5 Gy
Plan Total Fractions Prescribed: 28
Plan Total Prescribed Dose: 70 Gy
Reference Point Dosage Given to Date: 17.5 Gy
Reference Point Session Dosage Given: 2.5 Gy
Session Number: 7

## 2023-11-16 ENCOUNTER — Ambulatory Visit
Admission: RE | Admit: 2023-11-16 | Discharge: 2023-11-16 | Disposition: A | Discharge status: Home / Self Care | Source: Ambulatory Visit | Attending: Radiation Oncology

## 2023-11-16 ENCOUNTER — Other Ambulatory Visit: Payer: Self-pay

## 2023-11-16 DIAGNOSIS — Z510 Encounter for antineoplastic radiation therapy: Secondary | ICD-10-CM | POA: Diagnosis not present

## 2023-11-16 DIAGNOSIS — R300 Dysuria: Secondary | ICD-10-CM | POA: Diagnosis not present

## 2023-11-16 DIAGNOSIS — C61 Malignant neoplasm of prostate: Secondary | ICD-10-CM | POA: Diagnosis not present

## 2023-11-16 DIAGNOSIS — E871 Hypo-osmolality and hyponatremia: Secondary | ICD-10-CM | POA: Diagnosis not present

## 2023-11-16 DIAGNOSIS — Z191 Hormone sensitive malignancy status: Secondary | ICD-10-CM | POA: Diagnosis not present

## 2023-11-16 LAB — RAD ONC ARIA SESSION SUMMARY
Course Elapsed Days: 14
Plan Fractions Treated to Date: 8
Plan Prescribed Dose Per Fraction: 2.5 Gy
Plan Total Fractions Prescribed: 28
Plan Total Prescribed Dose: 70 Gy
Reference Point Dosage Given to Date: 20 Gy
Reference Point Session Dosage Given: 2.5 Gy
Session Number: 8

## 2023-11-17 ENCOUNTER — Other Ambulatory Visit: Payer: Self-pay

## 2023-11-17 ENCOUNTER — Ambulatory Visit
Admission: RE | Admit: 2023-11-17 | Discharge: 2023-11-17 | Disposition: A | Discharge status: Home / Self Care | Source: Ambulatory Visit | Attending: Radiation Oncology | Admitting: Radiation Oncology

## 2023-11-17 DIAGNOSIS — E871 Hypo-osmolality and hyponatremia: Secondary | ICD-10-CM | POA: Diagnosis not present

## 2023-11-17 DIAGNOSIS — Z6838 Body mass index (BMI) 38.0-38.9, adult: Secondary | ICD-10-CM | POA: Diagnosis not present

## 2023-11-17 DIAGNOSIS — E6609 Other obesity due to excess calories: Secondary | ICD-10-CM | POA: Diagnosis not present

## 2023-11-17 DIAGNOSIS — E860 Dehydration: Secondary | ICD-10-CM | POA: Diagnosis not present

## 2023-11-17 DIAGNOSIS — E1159 Type 2 diabetes mellitus with other circulatory complications: Secondary | ICD-10-CM | POA: Diagnosis not present

## 2023-11-17 DIAGNOSIS — I249 Acute ischemic heart disease, unspecified: Secondary | ICD-10-CM | POA: Diagnosis not present

## 2023-11-17 DIAGNOSIS — N289 Disorder of kidney and ureter, unspecified: Secondary | ICD-10-CM | POA: Diagnosis not present

## 2023-11-17 DIAGNOSIS — E039 Hypothyroidism, unspecified: Secondary | ICD-10-CM | POA: Diagnosis not present

## 2023-11-17 DIAGNOSIS — E7849 Other hyperlipidemia: Secondary | ICD-10-CM | POA: Diagnosis not present

## 2023-11-17 DIAGNOSIS — I10 Essential (primary) hypertension: Secondary | ICD-10-CM | POA: Diagnosis not present

## 2023-11-17 DIAGNOSIS — Z191 Hormone sensitive malignancy status: Secondary | ICD-10-CM | POA: Diagnosis not present

## 2023-11-17 DIAGNOSIS — C61 Malignant neoplasm of prostate: Secondary | ICD-10-CM | POA: Diagnosis not present

## 2023-11-17 DIAGNOSIS — R300 Dysuria: Secondary | ICD-10-CM | POA: Diagnosis not present

## 2023-11-17 DIAGNOSIS — Z510 Encounter for antineoplastic radiation therapy: Secondary | ICD-10-CM | POA: Diagnosis not present

## 2023-11-17 LAB — RAD ONC ARIA SESSION SUMMARY
Course Elapsed Days: 15
Plan Fractions Treated to Date: 9
Plan Prescribed Dose Per Fraction: 2.5 Gy
Plan Total Fractions Prescribed: 28
Plan Total Prescribed Dose: 70 Gy
Reference Point Dosage Given to Date: 22.5 Gy
Reference Point Session Dosage Given: 2.5 Gy
Session Number: 9

## 2023-11-18 ENCOUNTER — Other Ambulatory Visit: Payer: Self-pay

## 2023-11-18 ENCOUNTER — Ambulatory Visit
Admission: RE | Admit: 2023-11-18 | Discharge: 2023-11-18 | Disposition: A | Discharge status: Home / Self Care | Source: Ambulatory Visit | Attending: Radiation Oncology

## 2023-11-18 DIAGNOSIS — C61 Malignant neoplasm of prostate: Secondary | ICD-10-CM | POA: Diagnosis not present

## 2023-11-18 DIAGNOSIS — Z510 Encounter for antineoplastic radiation therapy: Secondary | ICD-10-CM | POA: Diagnosis not present

## 2023-11-18 DIAGNOSIS — Z191 Hormone sensitive malignancy status: Secondary | ICD-10-CM | POA: Diagnosis not present

## 2023-11-18 DIAGNOSIS — R300 Dysuria: Secondary | ICD-10-CM | POA: Diagnosis not present

## 2023-11-18 DIAGNOSIS — E871 Hypo-osmolality and hyponatremia: Secondary | ICD-10-CM | POA: Diagnosis not present

## 2023-11-18 LAB — RAD ONC ARIA SESSION SUMMARY
Course Elapsed Days: 16
Plan Fractions Treated to Date: 10
Plan Prescribed Dose Per Fraction: 2.5 Gy
Plan Total Fractions Prescribed: 28
Plan Total Prescribed Dose: 70 Gy
Reference Point Dosage Given to Date: 25 Gy
Reference Point Session Dosage Given: 2.5 Gy
Session Number: 10

## 2023-11-19 ENCOUNTER — Ambulatory Visit
Admission: RE | Admit: 2023-11-19 | Discharge: 2023-11-19 | Disposition: A | Discharge status: Home / Self Care | Source: Ambulatory Visit | Attending: Radiation Oncology | Admitting: Radiation Oncology

## 2023-11-19 ENCOUNTER — Other Ambulatory Visit: Payer: Self-pay

## 2023-11-19 DIAGNOSIS — R300 Dysuria: Secondary | ICD-10-CM | POA: Diagnosis not present

## 2023-11-19 DIAGNOSIS — Z191 Hormone sensitive malignancy status: Secondary | ICD-10-CM | POA: Diagnosis not present

## 2023-11-19 DIAGNOSIS — Z510 Encounter for antineoplastic radiation therapy: Secondary | ICD-10-CM | POA: Diagnosis not present

## 2023-11-19 DIAGNOSIS — E871 Hypo-osmolality and hyponatremia: Secondary | ICD-10-CM | POA: Diagnosis not present

## 2023-11-19 DIAGNOSIS — C61 Malignant neoplasm of prostate: Secondary | ICD-10-CM | POA: Diagnosis not present

## 2023-11-19 LAB — RAD ONC ARIA SESSION SUMMARY
Course Elapsed Days: 17
Plan Fractions Treated to Date: 11
Plan Prescribed Dose Per Fraction: 2.5 Gy
Plan Total Fractions Prescribed: 28
Plan Total Prescribed Dose: 70 Gy
Reference Point Dosage Given to Date: 27.5 Gy
Reference Point Session Dosage Given: 2.5 Gy
Session Number: 11

## 2023-11-20 ENCOUNTER — Ambulatory Visit
Admission: RE | Admit: 2023-11-20 | Discharge: 2023-11-20 | Disposition: A | Discharge status: Home / Self Care | Source: Ambulatory Visit | Attending: Radiation Oncology | Admitting: Radiation Oncology

## 2023-11-20 ENCOUNTER — Other Ambulatory Visit: Payer: Self-pay

## 2023-11-20 DIAGNOSIS — Z510 Encounter for antineoplastic radiation therapy: Secondary | ICD-10-CM | POA: Diagnosis not present

## 2023-11-20 DIAGNOSIS — E871 Hypo-osmolality and hyponatremia: Secondary | ICD-10-CM | POA: Diagnosis not present

## 2023-11-20 DIAGNOSIS — C61 Malignant neoplasm of prostate: Secondary | ICD-10-CM | POA: Diagnosis not present

## 2023-11-20 DIAGNOSIS — Z191 Hormone sensitive malignancy status: Secondary | ICD-10-CM | POA: Diagnosis not present

## 2023-11-20 DIAGNOSIS — R300 Dysuria: Secondary | ICD-10-CM | POA: Diagnosis not present

## 2023-11-20 LAB — RAD ONC ARIA SESSION SUMMARY
Course Elapsed Days: 18
Plan Fractions Treated to Date: 12
Plan Prescribed Dose Per Fraction: 2.5 Gy
Plan Total Fractions Prescribed: 28
Plan Total Prescribed Dose: 70 Gy
Reference Point Dosage Given to Date: 30 Gy
Reference Point Session Dosage Given: 2.5 Gy
Session Number: 12

## 2023-11-23 ENCOUNTER — Ambulatory Visit
Admission: RE | Admit: 2023-11-23 | Discharge: 2023-11-23 | Disposition: A | Discharge status: Home / Self Care | Source: Ambulatory Visit | Attending: Radiation Oncology

## 2023-11-23 ENCOUNTER — Other Ambulatory Visit: Payer: Self-pay

## 2023-11-23 DIAGNOSIS — Z510 Encounter for antineoplastic radiation therapy: Secondary | ICD-10-CM | POA: Diagnosis not present

## 2023-11-23 DIAGNOSIS — Z191 Hormone sensitive malignancy status: Secondary | ICD-10-CM | POA: Diagnosis not present

## 2023-11-23 DIAGNOSIS — C61 Malignant neoplasm of prostate: Secondary | ICD-10-CM | POA: Diagnosis not present

## 2023-11-23 DIAGNOSIS — R300 Dysuria: Secondary | ICD-10-CM | POA: Diagnosis not present

## 2023-11-23 DIAGNOSIS — E871 Hypo-osmolality and hyponatremia: Secondary | ICD-10-CM | POA: Diagnosis not present

## 2023-11-23 LAB — RAD ONC ARIA SESSION SUMMARY
Course Elapsed Days: 21
Plan Fractions Treated to Date: 13
Plan Prescribed Dose Per Fraction: 2.5 Gy
Plan Total Fractions Prescribed: 28
Plan Total Prescribed Dose: 70 Gy
Reference Point Dosage Given to Date: 32.5 Gy
Reference Point Session Dosage Given: 2.5 Gy
Session Number: 13

## 2023-11-24 ENCOUNTER — Ambulatory Visit
Admission: RE | Admit: 2023-11-24 | Discharge: 2023-11-24 | Disposition: A | Discharge status: Home / Self Care | Source: Ambulatory Visit | Attending: Radiation Oncology | Admitting: Radiation Oncology

## 2023-11-24 ENCOUNTER — Other Ambulatory Visit: Payer: Self-pay

## 2023-11-24 DIAGNOSIS — Z510 Encounter for antineoplastic radiation therapy: Secondary | ICD-10-CM | POA: Diagnosis not present

## 2023-11-24 DIAGNOSIS — R300 Dysuria: Secondary | ICD-10-CM | POA: Diagnosis not present

## 2023-11-24 DIAGNOSIS — C61 Malignant neoplasm of prostate: Secondary | ICD-10-CM | POA: Diagnosis not present

## 2023-11-24 DIAGNOSIS — Z191 Hormone sensitive malignancy status: Secondary | ICD-10-CM | POA: Diagnosis not present

## 2023-11-24 DIAGNOSIS — E871 Hypo-osmolality and hyponatremia: Secondary | ICD-10-CM | POA: Diagnosis not present

## 2023-11-24 LAB — RAD ONC ARIA SESSION SUMMARY
Course Elapsed Days: 22
Plan Fractions Treated to Date: 14
Plan Prescribed Dose Per Fraction: 2.5 Gy
Plan Total Fractions Prescribed: 28
Plan Total Prescribed Dose: 70 Gy
Reference Point Dosage Given to Date: 35 Gy
Reference Point Session Dosage Given: 2.5 Gy
Session Number: 14

## 2023-11-25 ENCOUNTER — Ambulatory Visit

## 2023-11-26 ENCOUNTER — Other Ambulatory Visit: Payer: Self-pay

## 2023-11-26 ENCOUNTER — Ambulatory Visit
Admission: RE | Admit: 2023-11-26 | Discharge: 2023-11-26 | Disposition: A | Discharge status: Home / Self Care | Source: Ambulatory Visit | Attending: Radiation Oncology

## 2023-11-26 ENCOUNTER — Other Ambulatory Visit: Payer: Self-pay | Admitting: Physician Assistant

## 2023-11-26 ENCOUNTER — Ambulatory Visit

## 2023-11-26 DIAGNOSIS — R300 Dysuria: Secondary | ICD-10-CM | POA: Diagnosis not present

## 2023-11-26 DIAGNOSIS — Z510 Encounter for antineoplastic radiation therapy: Secondary | ICD-10-CM | POA: Diagnosis not present

## 2023-11-26 DIAGNOSIS — C61 Malignant neoplasm of prostate: Secondary | ICD-10-CM | POA: Diagnosis not present

## 2023-11-26 DIAGNOSIS — E871 Hypo-osmolality and hyponatremia: Secondary | ICD-10-CM | POA: Diagnosis not present

## 2023-11-26 DIAGNOSIS — Z191 Hormone sensitive malignancy status: Secondary | ICD-10-CM | POA: Diagnosis not present

## 2023-11-26 LAB — RAD ONC ARIA SESSION SUMMARY
Course Elapsed Days: 24
Plan Fractions Treated to Date: 15
Plan Prescribed Dose Per Fraction: 2.5 Gy
Plan Total Fractions Prescribed: 28
Plan Total Prescribed Dose: 70 Gy
Reference Point Dosage Given to Date: 37.5 Gy
Reference Point Session Dosage Given: 2.5 Gy
Session Number: 15

## 2023-11-27 ENCOUNTER — Ambulatory Visit
Admission: RE | Admit: 2023-11-27 | Discharge: 2023-11-27 | Disposition: A | Discharge status: Home / Self Care | Source: Ambulatory Visit | Attending: Radiation Oncology | Admitting: Radiation Oncology

## 2023-11-27 ENCOUNTER — Other Ambulatory Visit: Payer: Self-pay

## 2023-11-27 DIAGNOSIS — E871 Hypo-osmolality and hyponatremia: Secondary | ICD-10-CM | POA: Diagnosis not present

## 2023-11-27 DIAGNOSIS — C61 Malignant neoplasm of prostate: Secondary | ICD-10-CM | POA: Diagnosis not present

## 2023-11-27 DIAGNOSIS — R300 Dysuria: Secondary | ICD-10-CM | POA: Diagnosis not present

## 2023-11-27 DIAGNOSIS — Z510 Encounter for antineoplastic radiation therapy: Secondary | ICD-10-CM | POA: Diagnosis not present

## 2023-11-27 DIAGNOSIS — Z191 Hormone sensitive malignancy status: Secondary | ICD-10-CM | POA: Diagnosis not present

## 2023-11-27 LAB — RAD ONC ARIA SESSION SUMMARY
Course Elapsed Days: 25
Plan Fractions Treated to Date: 16
Plan Prescribed Dose Per Fraction: 2.5 Gy
Plan Total Fractions Prescribed: 28
Plan Total Prescribed Dose: 70 Gy
Reference Point Dosage Given to Date: 40 Gy
Reference Point Session Dosage Given: 2.5 Gy
Session Number: 16

## 2023-11-30 ENCOUNTER — Other Ambulatory Visit: Payer: Self-pay

## 2023-11-30 ENCOUNTER — Ambulatory Visit
Admission: RE | Admit: 2023-11-30 | Discharge: 2023-11-30 | Disposition: A | Discharge status: Home / Self Care | Source: Ambulatory Visit | Attending: Radiation Oncology | Admitting: Radiation Oncology

## 2023-11-30 DIAGNOSIS — E871 Hypo-osmolality and hyponatremia: Secondary | ICD-10-CM | POA: Diagnosis not present

## 2023-11-30 DIAGNOSIS — Z510 Encounter for antineoplastic radiation therapy: Secondary | ICD-10-CM | POA: Diagnosis not present

## 2023-11-30 DIAGNOSIS — Z191 Hormone sensitive malignancy status: Secondary | ICD-10-CM | POA: Diagnosis not present

## 2023-11-30 DIAGNOSIS — C61 Malignant neoplasm of prostate: Secondary | ICD-10-CM | POA: Diagnosis not present

## 2023-11-30 DIAGNOSIS — R300 Dysuria: Secondary | ICD-10-CM | POA: Diagnosis not present

## 2023-11-30 LAB — RAD ONC ARIA SESSION SUMMARY
Course Elapsed Days: 28
Plan Fractions Treated to Date: 17
Plan Prescribed Dose Per Fraction: 2.5 Gy
Plan Total Fractions Prescribed: 28
Plan Total Prescribed Dose: 70 Gy
Reference Point Dosage Given to Date: 42.5 Gy
Reference Point Session Dosage Given: 2.5 Gy
Session Number: 17

## 2023-12-01 ENCOUNTER — Other Ambulatory Visit: Payer: Self-pay

## 2023-12-01 ENCOUNTER — Ambulatory Visit
Admission: RE | Admit: 2023-12-01 | Discharge: 2023-12-01 | Disposition: A | Discharge status: Home / Self Care | Source: Ambulatory Visit | Attending: Radiation Oncology | Admitting: Radiation Oncology

## 2023-12-01 ENCOUNTER — Ambulatory Visit

## 2023-12-01 DIAGNOSIS — C61 Malignant neoplasm of prostate: Secondary | ICD-10-CM

## 2023-12-01 DIAGNOSIS — Z191 Hormone sensitive malignancy status: Secondary | ICD-10-CM | POA: Diagnosis not present

## 2023-12-01 DIAGNOSIS — E871 Hypo-osmolality and hyponatremia: Secondary | ICD-10-CM | POA: Diagnosis not present

## 2023-12-01 DIAGNOSIS — Z510 Encounter for antineoplastic radiation therapy: Secondary | ICD-10-CM | POA: Diagnosis not present

## 2023-12-01 DIAGNOSIS — R300 Dysuria: Secondary | ICD-10-CM | POA: Diagnosis not present

## 2023-12-01 LAB — RAD ONC ARIA SESSION SUMMARY
Course Elapsed Days: 29
Plan Fractions Treated to Date: 18
Plan Prescribed Dose Per Fraction: 2.5 Gy
Plan Total Fractions Prescribed: 28
Plan Total Prescribed Dose: 70 Gy
Reference Point Dosage Given to Date: 45 Gy
Reference Point Session Dosage Given: 2.5 Gy
Session Number: 18

## 2023-12-02 ENCOUNTER — Other Ambulatory Visit: Payer: Self-pay

## 2023-12-02 ENCOUNTER — Ambulatory Visit
Admission: RE | Admit: 2023-12-02 | Discharge: 2023-12-02 | Disposition: A | Discharge status: Home / Self Care | Source: Ambulatory Visit | Attending: Radiation Oncology

## 2023-12-02 DIAGNOSIS — Z191 Hormone sensitive malignancy status: Secondary | ICD-10-CM | POA: Diagnosis not present

## 2023-12-02 DIAGNOSIS — E871 Hypo-osmolality and hyponatremia: Secondary | ICD-10-CM | POA: Diagnosis not present

## 2023-12-02 DIAGNOSIS — R300 Dysuria: Secondary | ICD-10-CM | POA: Diagnosis not present

## 2023-12-02 DIAGNOSIS — C61 Malignant neoplasm of prostate: Secondary | ICD-10-CM | POA: Diagnosis not present

## 2023-12-02 DIAGNOSIS — Z510 Encounter for antineoplastic radiation therapy: Secondary | ICD-10-CM | POA: Diagnosis not present

## 2023-12-02 LAB — RAD ONC ARIA SESSION SUMMARY
Course Elapsed Days: 30
Plan Fractions Treated to Date: 19
Plan Prescribed Dose Per Fraction: 2.5 Gy
Plan Total Fractions Prescribed: 28
Plan Total Prescribed Dose: 70 Gy
Reference Point Dosage Given to Date: 47.5 Gy
Reference Point Session Dosage Given: 2.5 Gy
Session Number: 19

## 2023-12-02 LAB — URINE CULTURE: Culture: NO GROWTH

## 2023-12-03 ENCOUNTER — Ambulatory Visit: Admitting: Radiation Oncology

## 2023-12-03 ENCOUNTER — Other Ambulatory Visit: Payer: Self-pay

## 2023-12-03 ENCOUNTER — Ambulatory Visit
Admission: RE | Admit: 2023-12-03 | Discharge: 2023-12-03 | Disposition: A | Discharge status: Home / Self Care | Source: Ambulatory Visit | Attending: Radiation Oncology | Admitting: Radiation Oncology

## 2023-12-03 DIAGNOSIS — R300 Dysuria: Secondary | ICD-10-CM | POA: Diagnosis not present

## 2023-12-03 DIAGNOSIS — Z510 Encounter for antineoplastic radiation therapy: Secondary | ICD-10-CM | POA: Diagnosis not present

## 2023-12-03 DIAGNOSIS — C61 Malignant neoplasm of prostate: Secondary | ICD-10-CM | POA: Diagnosis not present

## 2023-12-03 DIAGNOSIS — E871 Hypo-osmolality and hyponatremia: Secondary | ICD-10-CM | POA: Diagnosis not present

## 2023-12-03 DIAGNOSIS — Z191 Hormone sensitive malignancy status: Secondary | ICD-10-CM | POA: Diagnosis not present

## 2023-12-03 LAB — RAD ONC ARIA SESSION SUMMARY
Course Elapsed Days: 31
Plan Fractions Treated to Date: 20
Plan Prescribed Dose Per Fraction: 2.5 Gy
Plan Total Fractions Prescribed: 28
Plan Total Prescribed Dose: 70 Gy
Reference Point Dosage Given to Date: 50 Gy
Reference Point Session Dosage Given: 2.5 Gy
Session Number: 20

## 2023-12-04 ENCOUNTER — Ambulatory Visit
Admission: RE | Admit: 2023-12-04 | Discharge: 2023-12-04 | Disposition: A | Discharge status: Home / Self Care | Source: Ambulatory Visit | Attending: Radiation Oncology | Admitting: Radiation Oncology

## 2023-12-04 ENCOUNTER — Other Ambulatory Visit: Payer: Self-pay

## 2023-12-04 DIAGNOSIS — Z510 Encounter for antineoplastic radiation therapy: Secondary | ICD-10-CM | POA: Diagnosis not present

## 2023-12-04 DIAGNOSIS — E6609 Other obesity due to excess calories: Secondary | ICD-10-CM | POA: Diagnosis not present

## 2023-12-04 DIAGNOSIS — C61 Malignant neoplasm of prostate: Secondary | ICD-10-CM | POA: Insufficient documentation

## 2023-12-04 DIAGNOSIS — Z6837 Body mass index (BMI) 37.0-37.9, adult: Secondary | ICD-10-CM | POA: Diagnosis not present

## 2023-12-04 DIAGNOSIS — B349 Viral infection, unspecified: Secondary | ICD-10-CM | POA: Diagnosis not present

## 2023-12-04 DIAGNOSIS — Z191 Hormone sensitive malignancy status: Secondary | ICD-10-CM | POA: Diagnosis not present

## 2023-12-04 LAB — RAD ONC ARIA SESSION SUMMARY
Course Elapsed Days: 32
Plan Fractions Treated to Date: 21
Plan Prescribed Dose Per Fraction: 2.5 Gy
Plan Total Fractions Prescribed: 28
Plan Total Prescribed Dose: 70 Gy
Reference Point Dosage Given to Date: 52.5 Gy
Reference Point Session Dosage Given: 2.5 Gy
Session Number: 21

## 2023-12-07 ENCOUNTER — Other Ambulatory Visit: Payer: Self-pay

## 2023-12-07 ENCOUNTER — Ambulatory Visit
Admission: RE | Admit: 2023-12-07 | Discharge: 2023-12-07 | Disposition: A | Discharge status: Home / Self Care | Source: Ambulatory Visit | Attending: Radiation Oncology

## 2023-12-07 DIAGNOSIS — C61 Malignant neoplasm of prostate: Secondary | ICD-10-CM | POA: Diagnosis not present

## 2023-12-07 LAB — RAD ONC ARIA SESSION SUMMARY
Course Elapsed Days: 35
Plan Fractions Treated to Date: 22
Plan Prescribed Dose Per Fraction: 2.5 Gy
Plan Total Fractions Prescribed: 28
Plan Total Prescribed Dose: 70 Gy
Reference Point Dosage Given to Date: 55 Gy
Reference Point Session Dosage Given: 2.5 Gy
Session Number: 22

## 2023-12-08 ENCOUNTER — Ambulatory Visit
Admission: RE | Admit: 2023-12-08 | Discharge: 2023-12-08 | Disposition: A | Discharge status: Home / Self Care | Source: Ambulatory Visit | Attending: Radiation Oncology | Admitting: Radiation Oncology

## 2023-12-08 ENCOUNTER — Other Ambulatory Visit: Payer: Self-pay

## 2023-12-08 DIAGNOSIS — Z191 Hormone sensitive malignancy status: Secondary | ICD-10-CM | POA: Diagnosis not present

## 2023-12-08 DIAGNOSIS — C61 Malignant neoplasm of prostate: Secondary | ICD-10-CM | POA: Diagnosis not present

## 2023-12-08 DIAGNOSIS — Z510 Encounter for antineoplastic radiation therapy: Secondary | ICD-10-CM | POA: Diagnosis not present

## 2023-12-08 LAB — RAD ONC ARIA SESSION SUMMARY
Course Elapsed Days: 36
Plan Fractions Treated to Date: 23
Plan Prescribed Dose Per Fraction: 2.5 Gy
Plan Total Fractions Prescribed: 28
Plan Total Prescribed Dose: 70 Gy
Reference Point Dosage Given to Date: 57.5 Gy
Reference Point Session Dosage Given: 2.5 Gy
Session Number: 23

## 2023-12-09 ENCOUNTER — Ambulatory Visit
Admission: RE | Admit: 2023-12-09 | Discharge: 2023-12-09 | Disposition: A | Discharge status: Home / Self Care | Source: Ambulatory Visit | Attending: Radiation Oncology

## 2023-12-09 ENCOUNTER — Other Ambulatory Visit: Payer: Self-pay

## 2023-12-09 DIAGNOSIS — Z510 Encounter for antineoplastic radiation therapy: Secondary | ICD-10-CM | POA: Diagnosis not present

## 2023-12-09 DIAGNOSIS — C61 Malignant neoplasm of prostate: Secondary | ICD-10-CM | POA: Diagnosis not present

## 2023-12-09 DIAGNOSIS — Z191 Hormone sensitive malignancy status: Secondary | ICD-10-CM | POA: Diagnosis not present

## 2023-12-09 LAB — RAD ONC ARIA SESSION SUMMARY
Course Elapsed Days: 37
Plan Fractions Treated to Date: 24
Plan Prescribed Dose Per Fraction: 2.5 Gy
Plan Total Fractions Prescribed: 28
Plan Total Prescribed Dose: 70 Gy
Reference Point Dosage Given to Date: 60 Gy
Reference Point Session Dosage Given: 2.5 Gy
Session Number: 24

## 2023-12-10 ENCOUNTER — Ambulatory Visit

## 2023-12-10 ENCOUNTER — Ambulatory Visit
Admission: RE | Admit: 2023-12-10 | Discharge: 2023-12-10 | Disposition: A | Discharge status: Home / Self Care | Source: Ambulatory Visit | Attending: Radiation Oncology | Admitting: Radiation Oncology

## 2023-12-10 ENCOUNTER — Other Ambulatory Visit: Payer: Self-pay

## 2023-12-10 DIAGNOSIS — Z191 Hormone sensitive malignancy status: Secondary | ICD-10-CM | POA: Diagnosis not present

## 2023-12-10 DIAGNOSIS — C61 Malignant neoplasm of prostate: Secondary | ICD-10-CM | POA: Diagnosis not present

## 2023-12-10 DIAGNOSIS — Z510 Encounter for antineoplastic radiation therapy: Secondary | ICD-10-CM | POA: Diagnosis not present

## 2023-12-10 LAB — RAD ONC ARIA SESSION SUMMARY
Course Elapsed Days: 38
Plan Fractions Treated to Date: 25
Plan Prescribed Dose Per Fraction: 2.5 Gy
Plan Total Fractions Prescribed: 28
Plan Total Prescribed Dose: 70 Gy
Reference Point Dosage Given to Date: 62.5 Gy
Reference Point Session Dosage Given: 2.5 Gy
Session Number: 25

## 2023-12-11 ENCOUNTER — Ambulatory Visit

## 2023-12-11 ENCOUNTER — Other Ambulatory Visit: Payer: Self-pay

## 2023-12-11 ENCOUNTER — Ambulatory Visit
Admission: RE | Admit: 2023-12-11 | Discharge: 2023-12-11 | Disposition: A | Discharge status: Home / Self Care | Payer: Self-pay | Source: Ambulatory Visit | Attending: Radiation Oncology | Admitting: Radiation Oncology

## 2023-12-11 DIAGNOSIS — Z510 Encounter for antineoplastic radiation therapy: Secondary | ICD-10-CM | POA: Diagnosis not present

## 2023-12-11 DIAGNOSIS — E6609 Other obesity due to excess calories: Secondary | ICD-10-CM | POA: Diagnosis not present

## 2023-12-11 DIAGNOSIS — C61 Malignant neoplasm of prostate: Secondary | ICD-10-CM | POA: Diagnosis not present

## 2023-12-11 DIAGNOSIS — Z6837 Body mass index (BMI) 37.0-37.9, adult: Secondary | ICD-10-CM | POA: Diagnosis not present

## 2023-12-11 DIAGNOSIS — J0100 Acute maxillary sinusitis, unspecified: Secondary | ICD-10-CM | POA: Diagnosis not present

## 2023-12-11 DIAGNOSIS — Z191 Hormone sensitive malignancy status: Secondary | ICD-10-CM | POA: Diagnosis not present

## 2023-12-11 LAB — RAD ONC ARIA SESSION SUMMARY
Course Elapsed Days: 39
Plan Fractions Treated to Date: 26
Plan Prescribed Dose Per Fraction: 2.5 Gy
Plan Total Fractions Prescribed: 28
Plan Total Prescribed Dose: 70 Gy
Reference Point Dosage Given to Date: 65 Gy
Reference Point Session Dosage Given: 2.5 Gy
Session Number: 26

## 2023-12-14 ENCOUNTER — Other Ambulatory Visit: Payer: Self-pay

## 2023-12-14 ENCOUNTER — Ambulatory Visit
Admission: RE | Admit: 2023-12-14 | Discharge: 2023-12-14 | Disposition: A | Discharge status: Home / Self Care | Source: Ambulatory Visit | Attending: Radiation Oncology

## 2023-12-14 ENCOUNTER — Ambulatory Visit

## 2023-12-14 DIAGNOSIS — E6609 Other obesity due to excess calories: Secondary | ICD-10-CM | POA: Diagnosis not present

## 2023-12-14 DIAGNOSIS — Z6837 Body mass index (BMI) 37.0-37.9, adult: Secondary | ICD-10-CM | POA: Diagnosis not present

## 2023-12-14 DIAGNOSIS — J0100 Acute maxillary sinusitis, unspecified: Secondary | ICD-10-CM | POA: Diagnosis not present

## 2023-12-14 DIAGNOSIS — C61 Malignant neoplasm of prostate: Secondary | ICD-10-CM | POA: Diagnosis not present

## 2023-12-14 LAB — RAD ONC ARIA SESSION SUMMARY
Course Elapsed Days: 42
Plan Fractions Treated to Date: 27
Plan Prescribed Dose Per Fraction: 2.5 Gy
Plan Total Fractions Prescribed: 28
Plan Total Prescribed Dose: 70 Gy
Reference Point Dosage Given to Date: 67.5 Gy
Reference Point Session Dosage Given: 2.5 Gy
Session Number: 27

## 2023-12-15 ENCOUNTER — Ambulatory Visit
Admission: RE | Admit: 2023-12-15 | Discharge: 2023-12-15 | Disposition: A | Discharge status: Home / Self Care | Source: Ambulatory Visit | Attending: Radiation Oncology | Admitting: Radiation Oncology

## 2023-12-15 ENCOUNTER — Other Ambulatory Visit: Payer: Self-pay

## 2023-12-15 DIAGNOSIS — C61 Malignant neoplasm of prostate: Secondary | ICD-10-CM

## 2023-12-15 DIAGNOSIS — Z191 Hormone sensitive malignancy status: Secondary | ICD-10-CM | POA: Diagnosis not present

## 2023-12-15 DIAGNOSIS — Z510 Encounter for antineoplastic radiation therapy: Secondary | ICD-10-CM | POA: Diagnosis not present

## 2023-12-15 LAB — RAD ONC ARIA SESSION SUMMARY
Course Elapsed Days: 43
Plan Fractions Treated to Date: 28
Plan Prescribed Dose Per Fraction: 2.5 Gy
Plan Total Fractions Prescribed: 28
Plan Total Prescribed Dose: 70 Gy
Reference Point Dosage Given to Date: 70 Gy
Reference Point Session Dosage Given: 2.5 Gy
Session Number: 28

## 2023-12-15 NOTE — Progress Notes (Signed)
 Patient was a RadOnc Consult on 09/15/23 for his stage T1c adenocarcinoma of the prostate with Gleason score of 4+3, and PSA of 15.2.  Patient proceed with treatment recommendations of 5.5 week course of external beam therapy with boost to the PET positive left iliac lymph node without ADT and had his final radiation treatment on 12/15/23.   Patient will be scheduled for a post treatment nurse call and has an active urology follow up on 02/19/24.

## 2023-12-16 NOTE — Radiation Completion Notes (Signed)
  Radiation Oncology         [phone] ________________________________  Name: VAHAN WADSWORTH MRN: [id number]  Date: 12/15/2023  DOB: April 19, 1946  Referring Physician: VICTOR SHOWALTER, M.D. Date of Service: 2023-12-16 Radiation Oncologist: Adina Barge, M.D. [facility]     RADIATION ONCOLOGY END OF TREATMENT NOTE     Diagnosis:  78 y.o. gentleman with oligometastatic adenocarcinoma of the prostate with Gleason Score of 4+3, PSA of 15.2 and a solitary nodal metastasis in a left external iliac lymph node.   Intent: Curative     ==========DELIVERED PLANS==========  First Treatment Date: 2023-11-02 Last Treatment Date: 2023-12-15   Plan Name: Prostate_SIB Site: Prostate Technique: IMRT Mode: Photon Dose Per Fraction: 2.5 Gy Prescribed Dose (Delivered / Prescribed): 70 Gy / 70 Gy Prescribed Fxs (Delivered / Prescribed): 28 / 28     ==========ON TREATMENT VISIT DATES========== 2023-11-10, 2023-11-13, 2023-11-20, 2023-11-27, 2023-12-01, 2023-12-04, 2023-12-10, 2023-12-15     See weekly On Treatment Notes in Epic for details in the Media tab (listed as Progress notes on the On Treatment Visit Dates listed above).   The patient will receive a call in about one month from the radiation oncology department. He will continue follow up with his urologist, Dr. Shane, as well.  ------------------------------------------------   Donnice Barge, MD [facility]  Radiation Oncology Direct Dial: [phone]  Fax: [phone] [email or web address]  Skype  LinkedIn

## 2023-12-22 DIAGNOSIS — D225 Melanocytic nevi of trunk: Secondary | ICD-10-CM | POA: Diagnosis not present

## 2023-12-22 DIAGNOSIS — D485 Neoplasm of uncertain behavior of skin: Secondary | ICD-10-CM | POA: Diagnosis not present

## 2023-12-22 DIAGNOSIS — Z85820 Personal history of malignant melanoma of skin: Secondary | ICD-10-CM | POA: Diagnosis not present

## 2023-12-22 DIAGNOSIS — Z1283 Encounter for screening for malignant neoplasm of skin: Secondary | ICD-10-CM | POA: Diagnosis not present

## 2023-12-22 DIAGNOSIS — Z08 Encounter for follow-up examination after completed treatment for malignant neoplasm: Secondary | ICD-10-CM | POA: Diagnosis not present

## 2023-12-22 DIAGNOSIS — L989 Disorder of the skin and subcutaneous tissue, unspecified: Secondary | ICD-10-CM | POA: Diagnosis not present

## 2023-12-25 ENCOUNTER — Telehealth: Payer: Self-pay | Admitting: Radiation Oncology

## 2023-12-25 ENCOUNTER — Other Ambulatory Visit: Payer: Self-pay | Admitting: Urology

## 2023-12-25 DIAGNOSIS — C61 Malignant neoplasm of prostate: Secondary | ICD-10-CM

## 2023-12-25 NOTE — Telephone Encounter (Signed)
 Sent inbasket to [employer] regarding referral from Dr. Bedelia Rusk

## 2023-12-28 ENCOUNTER — Other Ambulatory Visit: Payer: Self-pay

## 2023-12-28 ENCOUNTER — Encounter (HOSPITAL_COMMUNITY): Payer: Self-pay

## 2023-12-28 ENCOUNTER — Emergency Department (HOSPITAL_COMMUNITY)

## 2023-12-28 ENCOUNTER — Emergency Department (HOSPITAL_COMMUNITY): Admitting: Certified Registered Nurse Anesthetist

## 2023-12-28 ENCOUNTER — Inpatient Hospital Stay (HOSPITAL_COMMUNITY)
Admission: EM | Admit: 2023-12-28 | Discharge: 2024-01-04 | DRG: 336 | Disposition: A | Discharge status: Home Health | Source: Skilled Nursing Facility | Attending: Internal Medicine | Admitting: Internal Medicine

## 2023-12-28 ENCOUNTER — Encounter (HOSPITAL_COMMUNITY)
Admission: EM | Disposition: A | Discharge status: Home Health | Payer: Self-pay | Source: Skilled Nursing Facility | Attending: Internal Medicine

## 2023-12-28 DIAGNOSIS — M170 Bilateral primary osteoarthritis of knee: Secondary | ICD-10-CM | POA: Diagnosis present

## 2023-12-28 DIAGNOSIS — E1165 Type 2 diabetes mellitus with hyperglycemia: Secondary | ICD-10-CM | POA: Diagnosis present

## 2023-12-28 DIAGNOSIS — K439 Ventral hernia without obstruction or gangrene: Secondary | ICD-10-CM | POA: Diagnosis present

## 2023-12-28 DIAGNOSIS — Z85820 Personal history of malignant melanoma of skin: Secondary | ICD-10-CM

## 2023-12-28 DIAGNOSIS — I878 Other specified disorders of veins: Secondary | ICD-10-CM | POA: Diagnosis present

## 2023-12-28 DIAGNOSIS — R140 Abdominal distension (gaseous): Secondary | ICD-10-CM | POA: Diagnosis not present

## 2023-12-28 DIAGNOSIS — N179 Acute kidney failure, unspecified: Secondary | ICD-10-CM | POA: Diagnosis not present

## 2023-12-28 DIAGNOSIS — G4733 Obstructive sleep apnea (adult) (pediatric): Secondary | ICD-10-CM | POA: Diagnosis present

## 2023-12-28 DIAGNOSIS — M549 Dorsalgia, unspecified: Secondary | ICD-10-CM | POA: Diagnosis not present

## 2023-12-28 DIAGNOSIS — Z955 Presence of coronary angioplasty implant and graft: Secondary | ICD-10-CM

## 2023-12-28 DIAGNOSIS — Z833 Family history of diabetes mellitus: Secondary | ICD-10-CM

## 2023-12-28 DIAGNOSIS — Z79890 Hormone replacement therapy (postmenopausal): Secondary | ICD-10-CM

## 2023-12-28 DIAGNOSIS — Z888 Allergy status to other drugs, medicaments and biological substances status: Secondary | ICD-10-CM

## 2023-12-28 DIAGNOSIS — Z8379 Family history of other diseases of the digestive system: Secondary | ICD-10-CM

## 2023-12-28 DIAGNOSIS — Z7982 Long term (current) use of aspirin: Secondary | ICD-10-CM

## 2023-12-28 DIAGNOSIS — I700 Atherosclerosis of aorta: Secondary | ICD-10-CM | POA: Diagnosis not present

## 2023-12-28 DIAGNOSIS — Z87891 Personal history of nicotine dependence: Secondary | ICD-10-CM

## 2023-12-28 DIAGNOSIS — Z4682 Encounter for fitting and adjustment of non-vascular catheter: Secondary | ICD-10-CM | POA: Diagnosis not present

## 2023-12-28 DIAGNOSIS — I252 Old myocardial infarction: Secondary | ICD-10-CM | POA: Diagnosis not present

## 2023-12-28 DIAGNOSIS — I25119 Atherosclerotic heart disease of native coronary artery with unspecified angina pectoris: Secondary | ICD-10-CM

## 2023-12-28 DIAGNOSIS — Z6833 Body mass index (BMI) 33.0-33.9, adult: Secondary | ICD-10-CM | POA: Diagnosis not present

## 2023-12-28 DIAGNOSIS — R1084 Generalized abdominal pain: Secondary | ICD-10-CM | POA: Diagnosis not present

## 2023-12-28 DIAGNOSIS — R1013 Epigastric pain: Secondary | ICD-10-CM | POA: Diagnosis present

## 2023-12-28 DIAGNOSIS — I2510 Atherosclerotic heart disease of native coronary artery without angina pectoris: Secondary | ICD-10-CM | POA: Diagnosis present

## 2023-12-28 DIAGNOSIS — N401 Enlarged prostate with lower urinary tract symptoms: Secondary | ICD-10-CM | POA: Diagnosis present

## 2023-12-28 DIAGNOSIS — I130 Hypertensive heart and chronic kidney disease with heart failure and stage 1 through stage 4 chronic kidney disease, or unspecified chronic kidney disease: Secondary | ICD-10-CM | POA: Diagnosis not present

## 2023-12-28 DIAGNOSIS — E669 Obesity, unspecified: Secondary | ICD-10-CM | POA: Diagnosis not present

## 2023-12-28 DIAGNOSIS — N1831 Chronic kidney disease, stage 3a: Secondary | ICD-10-CM | POA: Diagnosis present

## 2023-12-28 DIAGNOSIS — Z7984 Long term (current) use of oral hypoglycemic drugs: Secondary | ICD-10-CM | POA: Diagnosis not present

## 2023-12-28 DIAGNOSIS — Z885 Allergy status to narcotic agent status: Secondary | ICD-10-CM

## 2023-12-28 DIAGNOSIS — E871 Hypo-osmolality and hyponatremia: Secondary | ICD-10-CM | POA: Diagnosis not present

## 2023-12-28 DIAGNOSIS — E039 Hypothyroidism, unspecified: Secondary | ICD-10-CM | POA: Diagnosis present

## 2023-12-28 DIAGNOSIS — Z9842 Cataract extraction status, left eye: Secondary | ICD-10-CM

## 2023-12-28 DIAGNOSIS — I10 Essential (primary) hypertension: Secondary | ICD-10-CM | POA: Diagnosis present

## 2023-12-28 DIAGNOSIS — Z7902 Long term (current) use of antithrombotics/antiplatelets: Secondary | ICD-10-CM

## 2023-12-28 DIAGNOSIS — Z91013 Allergy to seafood: Secondary | ICD-10-CM

## 2023-12-28 DIAGNOSIS — E1122 Type 2 diabetes mellitus with diabetic chronic kidney disease: Secondary | ICD-10-CM | POA: Diagnosis present

## 2023-12-28 DIAGNOSIS — F17210 Nicotine dependence, cigarettes, uncomplicated: Secondary | ICD-10-CM | POA: Diagnosis present

## 2023-12-28 DIAGNOSIS — R110 Nausea: Secondary | ICD-10-CM | POA: Diagnosis not present

## 2023-12-28 DIAGNOSIS — Z8249 Family history of ischemic heart disease and other diseases of the circulatory system: Secondary | ICD-10-CM

## 2023-12-28 DIAGNOSIS — E782 Mixed hyperlipidemia: Secondary | ICD-10-CM | POA: Diagnosis present

## 2023-12-28 DIAGNOSIS — K56609 Unspecified intestinal obstruction, unspecified as to partial versus complete obstruction: Secondary | ICD-10-CM

## 2023-12-28 DIAGNOSIS — E66811 Obesity, class 1: Secondary | ICD-10-CM | POA: Diagnosis present

## 2023-12-28 DIAGNOSIS — K8020 Calculus of gallbladder without cholecystitis without obstruction: Secondary | ICD-10-CM | POA: Diagnosis not present

## 2023-12-28 DIAGNOSIS — K565 Intestinal adhesions [bands], unspecified as to partial versus complete obstruction: Secondary | ICD-10-CM | POA: Diagnosis not present

## 2023-12-28 DIAGNOSIS — Z7985 Long-term (current) use of injectable non-insulin antidiabetic drugs: Secondary | ICD-10-CM

## 2023-12-28 DIAGNOSIS — K9189 Other postprocedural complications and disorders of digestive system: Secondary | ICD-10-CM | POA: Diagnosis not present

## 2023-12-28 DIAGNOSIS — I5032 Chronic diastolic (congestive) heart failure: Secondary | ICD-10-CM | POA: Diagnosis present

## 2023-12-28 DIAGNOSIS — Z800 Family history of malignant neoplasm of digestive organs: Secondary | ICD-10-CM

## 2023-12-28 DIAGNOSIS — Z881 Allergy status to other antibiotic agents status: Secondary | ICD-10-CM

## 2023-12-28 DIAGNOSIS — Z860100 Personal history of colon polyps, unspecified: Secondary | ICD-10-CM

## 2023-12-28 DIAGNOSIS — L409 Psoriasis, unspecified: Secondary | ICD-10-CM | POA: Diagnosis present

## 2023-12-28 DIAGNOSIS — Z961 Presence of intraocular lens: Secondary | ICD-10-CM | POA: Diagnosis present

## 2023-12-28 DIAGNOSIS — Z91041 Radiographic dye allergy status: Secondary | ICD-10-CM

## 2023-12-28 DIAGNOSIS — Z79899 Other long term (current) drug therapy: Secondary | ICD-10-CM

## 2023-12-28 DIAGNOSIS — K567 Ileus, unspecified: Secondary | ICD-10-CM | POA: Diagnosis not present

## 2023-12-28 DIAGNOSIS — Z9841 Cataract extraction status, right eye: Secondary | ICD-10-CM

## 2023-12-28 DIAGNOSIS — Z923 Personal history of irradiation: Secondary | ICD-10-CM

## 2023-12-28 DIAGNOSIS — Z882 Allergy status to sulfonamides status: Secondary | ICD-10-CM

## 2023-12-28 DIAGNOSIS — K660 Peritoneal adhesions (postprocedural) (postinfection): Secondary | ICD-10-CM

## 2023-12-28 DIAGNOSIS — Z8546 Personal history of malignant neoplasm of prostate: Secondary | ICD-10-CM

## 2023-12-28 HISTORY — PX: VENTRAL HERNIA REPAIR: SHX424

## 2023-12-28 HISTORY — PX: LYSIS OF ADHESION: SHX5961

## 2023-12-28 LAB — HEMOGLOBIN A1C
Hgb A1c MFr Bld: 6.1 % — ABNORMAL HIGH (ref 4.8–5.6)
Mean Plasma Glucose: 128.37 mg/dL

## 2023-12-28 LAB — URINALYSIS, ROUTINE W REFLEX MICROSCOPIC
Bilirubin Urine: NEGATIVE
Glucose, UA: NEGATIVE mg/dL
Hgb urine dipstick: NEGATIVE
Ketones, ur: NEGATIVE mg/dL
Leukocytes,Ua: NEGATIVE
Nitrite: NEGATIVE
Protein, ur: NEGATIVE mg/dL
Specific Gravity, Urine: 1.01 (ref 1.005–1.030)
pH: 7 (ref 5.0–8.0)

## 2023-12-28 LAB — COMPREHENSIVE METABOLIC PANEL WITH GFR
ALT: 12 U/L (ref 0–44)
AST: 13 U/L — ABNORMAL LOW (ref 15–41)
Albumin: 3.9 g/dL (ref 3.5–5.0)
Alkaline Phosphatase: 46 U/L (ref 38–126)
Anion gap: 12 (ref 5–15)
BUN: 29 mg/dL — ABNORMAL HIGH (ref 8–23)
CO2: 24 mmol/L (ref 22–32)
Calcium: 9.8 mg/dL (ref 8.9–10.3)
Chloride: 94 mmol/L — ABNORMAL LOW (ref 98–111)
Creatinine, Ser: 1.67 mg/dL — ABNORMAL HIGH (ref 0.61–1.24)
GFR, Estimated: 42 mL/min — ABNORMAL LOW (ref >60)
Glucose, Bld: 148 mg/dL — ABNORMAL HIGH (ref 70–99)
Potassium: 4.4 mmol/L (ref 3.5–5.1)
Sodium: 130 mmol/L — ABNORMAL LOW (ref 135–145)
Total Bilirubin: 1.1 mg/dL (ref 0.0–1.2)
Total Protein: 6.8 g/dL (ref 6.5–8.1)

## 2023-12-28 LAB — LACTIC ACID, PLASMA
Lactic Acid, Venous: 1 mmol/L (ref 0.5–1.9)
Lactic Acid, Venous: 1.2 mmol/L (ref 0.5–1.9)

## 2023-12-28 LAB — TYPE AND SCREEN
ABO/RH(D): A POS
Antibody Screen: NEGATIVE

## 2023-12-28 LAB — TROPONIN I (HIGH SENSITIVITY)
Troponin I (High Sensitivity): 2 ng/L (ref <18)
Troponin I (High Sensitivity): 2 ng/L (ref <18)

## 2023-12-28 LAB — CBC
HCT: 29.8 % — ABNORMAL LOW (ref 39.0–52.0)
Hemoglobin: 9.9 g/dL — ABNORMAL LOW (ref 13.0–17.0)
MCH: 32.5 pg (ref 26.0–34.0)
MCHC: 33.2 g/dL (ref 30.0–36.0)
MCV: 97.7 fL (ref 80.0–100.0)
Platelets: 121 K/uL — ABNORMAL LOW (ref 150–400)
RBC: 3.05 MIL/uL — ABNORMAL LOW (ref 4.22–5.81)
RDW: 13.1 % (ref 11.5–15.5)
WBC: 5.9 K/uL (ref 4.0–10.5)
nRBC: 0 % (ref 0.0–0.2)

## 2023-12-28 LAB — GLUCOSE, CAPILLARY
Glucose-Capillary: 182 mg/dL — ABNORMAL HIGH (ref 70–99)
Glucose-Capillary: 111 mg/dL — ABNORMAL HIGH (ref 70–99)
Glucose-Capillary: 137 mg/dL — ABNORMAL HIGH (ref 70–99)
Glucose-Capillary: 137 mg/dL — ABNORMAL HIGH (ref 70–99)

## 2023-12-28 LAB — LIPASE, BLOOD: Lipase: 27 U/L (ref 11–51)

## 2023-12-28 SURGERY — LAPAROTOMY, FOR LYSIS OF ADHESIONS
Anesthesia: General | Site: Abdomen

## 2023-12-28 MED ORDER — ROCURONIUM BROMIDE 10 MG/ML (PF) SYRINGE
PREFILLED_SYRINGE | INTRAVENOUS | Status: AC
  Filled 2023-12-28: qty 10

## 2023-12-28 MED ORDER — HYDROMORPHONE HCL 1 MG/ML IJ SOLN
0.5000 mg | INTRAMUSCULAR | Status: DC | PRN
  Administered 2023-12-28 – 2024-01-02 (×15): 0.5 mg via INTRAVENOUS
  Filled 2023-12-28 (×16): qty 0.5

## 2023-12-28 MED ORDER — EPHEDRINE SULFATE-NACL 50-0.9 MG/10ML-% IV SOSY
PREFILLED_SYRINGE | INTRAVENOUS | Status: DC | PRN
  Administered 2023-12-28: 10 mg via INTRAVENOUS

## 2023-12-28 MED ORDER — SUGAMMADEX SODIUM 200 MG/2ML IV SOLN
INTRAVENOUS | Status: AC
  Filled 2023-12-28: qty 2

## 2023-12-28 MED ORDER — ORAL CARE MOUTH RINSE: 15.0000 mL | Freq: Once | OROMUCOSAL | Status: DC

## 2023-12-28 MED ORDER — SUGAMMADEX SODIUM 200 MG/2ML IV SOLN
INTRAVENOUS | Status: DC | PRN
  Administered 2023-12-28: 200 mg via INTRAVENOUS

## 2023-12-28 MED ORDER — FENTANYL CITRATE (PF) 250 MCG/5ML IJ SOLN
INTRAMUSCULAR | Status: AC
Start: 2023-12-28 — End: 2023-12-28
  Filled 2023-12-28: qty 5

## 2023-12-28 MED ORDER — FENTANYL CITRATE (PF) 100 MCG/2ML IJ SOLN
INTRAMUSCULAR | Status: DC | PRN
  Administered 2023-12-28 (×2): 50 ug via INTRAVENOUS

## 2023-12-28 MED ORDER — CIPROFLOXACIN IN D5W 400 MG/200ML IV SOLN
400.0000 mg | INTRAVENOUS | Status: AC
  Administered 2023-12-28: 400 mg via INTRAVENOUS
  Filled 2023-12-28: qty 200

## 2023-12-28 MED ORDER — EPHEDRINE 5 MG/ML INJ
INTRAVENOUS | Status: AC
  Filled 2023-12-28: qty 5

## 2023-12-28 MED ORDER — BUPIVACAINE HCL (PF) 0.5 % IJ SOLN
INTRAMUSCULAR | Status: DC | PRN
  Administered 2023-12-28: 30 mL

## 2023-12-28 MED ORDER — PHENYLEPHRINE HCL-NACL 20-0.9 MG/250ML-% IV SOLN
INTRAVENOUS | Status: DC | PRN
  Administered 2023-12-28: 25 ug/min via INTRAVENOUS

## 2023-12-28 MED ORDER — HYDROMORPHONE HCL 1 MG/ML IJ SOLN
0.5000 mg | Freq: Once | INTRAMUSCULAR | Status: AC
  Administered 2023-12-28: 0.5 mg via INTRAVENOUS
  Filled 2023-12-28: qty 0.5

## 2023-12-28 MED ORDER — PROPOFOL 10 MG/ML IV BOLUS
INTRAVENOUS | Status: DC | PRN
  Administered 2023-12-28: 40 ug/kg/min via INTRAVENOUS
  Administered 2023-12-28: 160 mg via INTRAVENOUS

## 2023-12-28 MED ORDER — ACETAMINOPHEN 10 MG/ML IV SOLN
INTRAVENOUS | Status: DC | PRN
  Administered 2023-12-28: 1000 mg via INTRAVENOUS

## 2023-12-28 MED ORDER — METOPROLOL TARTRATE 5 MG/5ML IV SOLN
5.0000 mg | Freq: Four times a day (QID) | INTRAVENOUS | Status: DC
  Filled 2023-12-28: qty 5

## 2023-12-28 MED ORDER — PROPOFOL 10 MG/ML IV BOLUS
INTRAVENOUS | Status: AC
  Filled 2023-12-28: qty 20

## 2023-12-28 MED ORDER — ACETAMINOPHEN 10 MG/ML IV SOLN
INTRAVENOUS | Status: AC
Start: 2023-12-28 — End: 2023-12-28
  Filled 2023-12-28: qty 100

## 2023-12-28 MED ORDER — ACETAMINOPHEN 650 MG RE SUPP: 650.0000 mg | Freq: Four times a day (QID) | RECTAL | Status: DC | PRN

## 2023-12-28 MED ORDER — HEPARIN SODIUM (PORCINE) 5000 UNIT/ML IJ SOLN
5000.0000 [IU] | Freq: Three times a day (TID) | INTRAMUSCULAR | Status: DC
  Administered 2023-12-28 – 2024-01-04 (×20): 5000 [IU] via SUBCUTANEOUS
  Filled 2023-12-28 (×20): qty 1

## 2023-12-28 MED ORDER — SODIUM CHLORIDE 0.9 % IR SOLN
Status: DC | PRN
  Administered 2023-12-28 (×3): 1000 mL

## 2023-12-28 MED ORDER — CHLORHEXIDINE GLUCONATE CLOTH 2 % EX PADS: 6.0000 | MEDICATED_PAD | Freq: Once | CUTANEOUS | Status: DC

## 2023-12-28 MED ORDER — ARTIFICIAL TEARS OPHTHALMIC OINT
TOPICAL_OINTMENT | OPHTHALMIC | Status: AC
  Filled 2023-12-28: qty 3.5

## 2023-12-28 MED ORDER — LIDOCAINE HCL (CARDIAC) PF 100 MG/5ML IV SOSY
PREFILLED_SYRINGE | INTRAVENOUS | Status: DC | PRN
  Administered 2023-12-28: 60 mg via INTRATRACHEAL

## 2023-12-28 MED ORDER — OXYCODONE HCL 5 MG PO TABS: 5.0000 mg | ORAL_TABLET | ORAL | Status: DC | PRN

## 2023-12-28 MED ORDER — ONDANSETRON HCL 4 MG/2ML IJ SOLN
4.0000 mg | Freq: Once | INTRAMUSCULAR | Status: AC
  Administered 2023-12-28: 4 mg via INTRAVENOUS
  Filled 2023-12-28: qty 2

## 2023-12-28 MED ORDER — ONDANSETRON HCL 4 MG/2ML IJ SOLN
4.0000 mg | Freq: Four times a day (QID) | INTRAMUSCULAR | Status: DC | PRN
  Administered 2023-12-28 – 2023-12-31 (×2): 4 mg via INTRAVENOUS
  Filled 2023-12-28 (×2): qty 2

## 2023-12-28 MED ORDER — LACTATED RINGERS IV SOLN: INTRAVENOUS | Status: AC

## 2023-12-28 MED ORDER — SUCCINYLCHOLINE CHLORIDE 200 MG/10ML IV SOSY
PREFILLED_SYRINGE | INTRAVENOUS | Status: AC
  Filled 2023-12-28: qty 10

## 2023-12-28 MED ORDER — ACETAMINOPHEN 500 MG PO TABS
1000.0000 mg | ORAL_TABLET | Freq: Four times a day (QID) | ORAL | Status: DC
  Filled 2023-12-28 (×3): qty 2

## 2023-12-28 MED ORDER — INSULIN ASPART 100 UNIT/ML IJ SOLN
0.0000 [IU] | INTRAMUSCULAR | Status: DC
  Administered 2023-12-28: 1 [IU] via SUBCUTANEOUS
  Administered 2023-12-28: 2 [IU] via SUBCUTANEOUS
  Administered 2023-12-29 – 2023-12-30 (×5): 1 [IU] via SUBCUTANEOUS

## 2023-12-28 MED ORDER — ACETAMINOPHEN 325 MG PO TABS
650.0000 mg | ORAL_TABLET | Freq: Four times a day (QID) | ORAL | Status: DC | PRN
Start: 2023-12-28 — End: 2023-12-28

## 2023-12-28 MED ORDER — ONDANSETRON HCL 4 MG/2ML IJ SOLN
INTRAMUSCULAR | Status: DC | PRN
  Administered 2023-12-28: 4 mg via INTRAVENOUS

## 2023-12-28 MED ORDER — LACTATED RINGERS IV SOLN: INTRAVENOUS | Status: DC

## 2023-12-28 MED ORDER — HYDROMORPHONE HCL 1 MG/ML IJ SOLN
INTRAMUSCULAR | Status: AC
Start: 2023-12-28 — End: 2023-12-28
  Filled 2023-12-28: qty 1

## 2023-12-28 MED ORDER — ROCURONIUM BROMIDE 10 MG/ML (PF) SYRINGE
PREFILLED_SYRINGE | INTRAVENOUS | Status: DC | PRN
  Administered 2023-12-28: 60 mg via INTRAVENOUS

## 2023-12-28 MED ORDER — METRONIDAZOLE 500 MG/100ML IV SOLN
500.0000 mg | INTRAVENOUS | Status: AC
  Administered 2023-12-28: 500 mg via INTRAVENOUS
  Filled 2023-12-28: qty 100

## 2023-12-28 MED ORDER — PHENYLEPHRINE 80 MCG/ML (10ML) SYRINGE FOR IV PUSH (FOR BLOOD PRESSURE SUPPORT)
PREFILLED_SYRINGE | INTRAVENOUS | Status: DC | PRN
  Administered 2023-12-28: 160 ug via INTRAVENOUS
  Administered 2023-12-28 (×2): 80 ug via INTRAVENOUS
  Administered 2023-12-28 (×2): 160 ug via INTRAVENOUS

## 2023-12-28 MED ORDER — HYDROMORPHONE HCL 1 MG/ML IJ SOLN
0.5000 mg | INTRAMUSCULAR | Status: DC | PRN
  Administered 2023-12-28: 0.5 mg via INTRAVENOUS
  Filled 2023-12-28: qty 0.5

## 2023-12-28 MED ORDER — SODIUM CHLORIDE 0.9 % IV BOLUS
1000.0000 mL | Freq: Once | INTRAVENOUS | Status: AC
  Administered 2023-12-28: 1000 mL via INTRAVENOUS

## 2023-12-28 MED ORDER — ONDANSETRON HCL 4 MG PO TABS
4.0000 mg | ORAL_TABLET | Freq: Four times a day (QID) | ORAL | Status: DC | PRN
Start: 2023-12-28 — End: 2024-01-04

## 2023-12-28 MED ORDER — LACTATED RINGERS IV BOLUS
1000.0000 mL | Freq: Once | INTRAVENOUS | Status: AC
  Administered 2023-12-28: 1000 mL via INTRAVENOUS

## 2023-12-28 MED ORDER — ONDANSETRON HCL 4 MG/2ML IJ SOLN
INTRAMUSCULAR | Status: AC
  Filled 2023-12-28: qty 2

## 2023-12-28 MED ORDER — PHENYLEPHRINE 80 MCG/ML (10ML) SYRINGE FOR IV PUSH (FOR BLOOD PRESSURE SUPPORT)
PREFILLED_SYRINGE | INTRAVENOUS | Status: AC
  Filled 2023-12-28: qty 10

## 2023-12-28 MED ORDER — CHLORHEXIDINE GLUCONATE 0.12 % MT SOLN: 15.0000 mL | Freq: Once | OROMUCOSAL | Status: DC

## 2023-12-28 MED ORDER — LIDOCAINE 2% (20 MG/ML) 5 ML SYRINGE
INTRAMUSCULAR | Status: AC
  Filled 2023-12-28: qty 5

## 2023-12-28 MED ORDER — BUPIVACAINE HCL (PF) 0.5 % IJ SOLN
INTRAMUSCULAR | Status: AC
  Filled 2023-12-28: qty 30

## 2023-12-28 SURGICAL SUPPLY — 30 items
CHLORAPREP W/TINT 26 (MISCELLANEOUS) ×3 IMPLANT
COVER LIGHT HANDLE STERIS (MISCELLANEOUS) ×6 IMPLANT
DRSG OPSITE POSTOP 4X10 (GAUZE/BANDAGES/DRESSINGS) ×1 IMPLANT
ELECTRODE REM PT RTRN 9FT ADLT (ELECTROSURGICAL) ×3 IMPLANT
GLOVE BIOGEL PI IND STRL 6.5 (GLOVE) ×3 IMPLANT
GLOVE BIOGEL PI IND STRL 7.0 (GLOVE) ×7 IMPLANT
GLOVE SURG SS PI 6.5 STRL IVOR (GLOVE) ×6 IMPLANT
GOWN STRL REUS W/TWL LRG LVL3 (GOWN DISPOSABLE) ×9 IMPLANT
HANDLE SUCTION POOLE (INSTRUMENTS) ×3 IMPLANT
INST SET MAJOR GENERAL (KITS) ×3 IMPLANT
KIT TURNOVER KIT A (KITS) ×3 IMPLANT
LIGASURE IMPACT 36 18CM CVD LR (INSTRUMENTS) ×1 IMPLANT
MANIFOLD NEPTUNE II (INSTRUMENTS) ×3 IMPLANT
NDL HYPO 21X1.5 SAFETY (NEEDLE) ×2 IMPLANT
NEEDLE HYPO 21X1.5 SAFETY (NEEDLE) ×2 IMPLANT
NS IRRIG 1000ML POUR BTL (IV SOLUTION) ×7 IMPLANT
NS IRRIG 500ML POUR BTL (IV SOLUTION) ×1 IMPLANT
PACK MAJOR ABDOMINAL (CUSTOM PROCEDURE TRAY) ×3 IMPLANT
PAD ARMBOARD POSITIONER FOAM (MISCELLANEOUS) ×3 IMPLANT
PENCIL SMOKE EVACUATOR COATED (MISCELLANEOUS) ×3 IMPLANT
POSITIONER HEAD 8X9X4 ADT (SOFTGOODS) ×3 IMPLANT
RETRACTOR WND ALEXIS-O 25 LRG (MISCELLANEOUS) ×1 IMPLANT
SET BASIN LINEN APH (SET/KITS/TRAYS/PACK) ×3 IMPLANT
SPONGE T-LAP 18X18 ~~LOC~~+RFID (SPONGE) ×3 IMPLANT
STAPLER VISISTAT (STAPLE) ×3 IMPLANT
SUT ETHIBOND 0 MO6 C/R (SUTURE) ×2 IMPLANT
SUT PDS AB CT VIOLET #0 27IN (SUTURE) ×6 IMPLANT
SUT SILK 3 0 SH CR/8 (SUTURE) ×3 IMPLANT
SYR 30ML LL (SYRINGE) ×6 IMPLANT
TRAY FOLEY MTR SLVR 16FR STAT (SET/KITS/TRAYS/PACK) ×3 IMPLANT

## 2023-12-28 NOTE — ED Notes (Signed)
Provider at beside.

## 2023-12-28 NOTE — ED Notes (Signed)
 ED Provider at bedside.

## 2023-12-28 NOTE — Transfer of Care (Signed)
 Immediate Anesthesia Transfer of Care Note  Patient: Kevin [name]  Procedure(s) Performed: LAPAROTOMY, FOR LYSIS OF ADHESIONS (Abdomen) PRIMARY REPAIR, HERNIA, VENTRAL (Abdomen)  Patient Location: PACU  Anesthesia Type:General  Level of Consciousness: awake and alert   Airway & Oxygen Therapy: Patient Spontanous Breathing  Post-op Assessment: Report given to RN and Post -op Vital signs reviewed and stable  Post vital signs: Reviewed and stable  Last Vitals:  Vitals Value Taken Time  BP 111/51 12/28/23 17:43  Temp 98   Pulse 82 12/28/23 17:45  Resp 12 12/28/23 17:45  SpO2 93 % 12/28/23 17:45  Vitals shown include unfiled device data.  Last Pain:  Vitals:   12/28/23 1531  TempSrc: Oral  PainSc: 2       Patients Stated Pain Goal: 4 (12/28/23 1531)  Complications: No notable events documented.

## 2023-12-28 NOTE — H&P (Signed)
 History and Physical    Patient: Kevin [name] FMW:[id number] DOB: 28-Mar-1946 DOA: 12/28/2023 DOS: the patient was seen and examined on 12/28/2023 PCP: Marvine Rush, MD  Patient coming from: ALF/ILF  Chief Complaint:  Chief Complaint  Patient presents with   Abdominal Pain   HPI: Kevin [name] is a 78 year old male with a history of coronary artery disease status post LAD and circumflex stenting 2001 and 2004, 02/14/2015 revealed diffuse LAD in-stent restenosis which was restented, diabetes mellitus type 2, hypertension, hyperlipidemia, prostate cancer, hypothyroidism, SBO from volvulus presenting (2023) with 1 day history of abdominal pain with nausea and vomiting.  The patient states that he had been off of his Mounjaro for about 2 months because of his prostate cancer treatments.  He restarted it on 12/26/2023.  On the morning of 12/27/2023, the patient began developing abdominal pain.  This worsened and he began developing nausea and vomiting and dry heaving.  As result, he presented for further evaluation and treatment.  He denies any other new medications. He denies any fevers, chills, chest pain, cough, hemoptysis, hematochezia, melena, dysuria, hematuria.  In the ED, the patient was afebrile hemodynamically stable with oxygen saturation 1 9% room air.  WBC 5.9, hemoglobin 9.9, platelets 121.  Sodium 130, potassium 4.4, bicarbonate 24, serum creatinine 1.67.  LFTs unremarkable.  CT abdomen pelvis showed mesenteric edema adjacent to dilated small bowel loops.  There was a transition point in the small bowel in the midabdomen.  There is a large ventral hernia with dilated small bowel loops, but the ventral hernia does not appear to be cause of SBO.  General surgery was consulted to assist with management.  NG was placed for decompression.   Review of Systems: As mentioned in the history of present illness. All other systems reviewed and are negative. Past Medical History:  Diagnosis Date    Aortic atherosclerosis (HCC) 09/15/2023   Benign localized prostatic hyperplasia with lower urinary tract symptoms (LUTS)    CAD (coronary artery disease) 2001   cardiologist-- dr court;    11/ 2001  acute anferior MI  s/p cath w/ PCI ostial diag & PTCA & BMS to pLAD;   Cath 12/ 2004 stent to mCFx;  cath 02/ 2009 PTCA BMS to dCFx & PTCA/ [place] angio DES to mCFx; cath 07/2013 TO nondominant RCA/ patent LAD& CFx stents;  cath 10/ 2016 DES to mLAD for ISR;  NUC 11-12-2016 LR w/ normal perfusion, nuclear ef 59%   Carpal tunnel syndrome, bilateral    Chronic diastolic CHF (congestive heart failure) (HCC) 07/2013   Chronic rhinitis    Chronic venous stasis    history s/p left GSV ablation yrs ago   CKD (chronic kidney disease), stage III (HCC)    Diverticulosis of colon    Edema of both lower extremities    Environmental and seasonal allergies    History of acute anterior wall MI 2001   History of malignant melanoma 02/2010   previously followed by oncology-- dr verneita (released 2017)  dx 10/ 2011;  02-18-2010  s/p WLE left foot ;   Stage IA,  no SLN bx,  no other treatment and no recurrence   History of non-ST elevation myocardial infarction (NSTEMI) 02/13/2015   History of small bowel obstruction 09/20/2021   s/p  surgical intervention   HTN (hypertension)    Hyperlipidemia, mixed    Hypothyroidism    Incomplete emptying of bladder    Malignant neoplasm prostate (HCC) 08/2023   primary urology---  dr showalter/  radiation oncology--- dr patrcia;   dx 04/ 2025,  Gleason 4+3,  PSA 15.2   OA (osteoarthritis)    knees   OSA (obstructive sleep apnea)    10-15-2023  pt stated does use CPAP since 2023 after losing 140 lb   Psoriasis    S/p bare metal coronary artery stent    11/ 2001  BMS to pLAD;     02/ 2009 BMS to distal dominant CFx   S/P drug eluting coronary stent placement    02/ 2009  DES to mCFx;  DES to mLAD   Type 2 diabetes mellitus (HCC)    followed by pcp   (10-15-2023  pt  stated does check blood sugars at home)   Ventral hernia without obstruction or gangrene    followed by general surgeon--- dr l. bridges   Weak urine stream    Past Surgical History:  Procedure Laterality Date   APPENDECTOMY     1950s   CARDIAC CATHETERIZATION N/A 02/14/2015   Procedure: Left Heart Cath and Coronary Angiography;  Surgeon: Candyce GORMAN Reek, MD;  Location: [facility] INVASIVE CV LAB;  Service: Cardiovascular;  Laterality: N/A;   CARDIAC CATHETERIZATION N/A 02/14/2015   Procedure: Coronary Stent Intervention;  Surgeon: Candyce GORMAN Reek, MD;  Location: [facility] INVASIVE CV LAB;  Service: Cardiovascular;  Laterality: N/A;   CATARACT EXTRACTION W/ INTRAOCULAR LENS IMPLANT Bilateral 2007   COLONOSCOPY N/A 10/09/2010   normal rectum, long redundant colon, pancolonic diverticula, 6mm cecal polyp, diminutive polyp at the appendiceal orifice ablated with tip of hot snare (Biopsies suggestive of microscopic colitis; polyp benign; small bowel ok)   CORONARY ANGIOPLASTY WITH STENT PLACEMENT  06/11/2007   @[facility]   by dr ladona;   PTCA & BMS distal CX, PTCA & balloon angioplasty in-stent restenotic & DES to mid CX   CORONARY ANGIOPLASTY WITH STENT PLACEMENT  03/2000   @ [facility]  by dr b. obie;  PCI to ostial diagnal ;  PTCA and BMS to pLAD   CORONARY ANGIOPLASTY WITH STENT PLACEMENT  04/2003   @[facility]  by dr wyn;  stent to midCFx   ESOPHAGOGASTRODUODENOSCOPY N/A 10/09/2010   Normal esophagus, lap band present, 2nd portion duodenum with erosions without frank ulcer   GASTRIC BANDING PORT REVISION N/A 09/20/2021   Procedure: GASTRIC BAND REMOVAL;  Surgeon: Kallie Manuelita BROCKS, MD;  Location: [facility] ORS;  Service: General;  Laterality: N/A;   GOLD SEED IMPLANT N/A 10/20/2023   Procedure: INSERTION, GOLD SEEDS;  Surgeon: Shane Steffan BROCKS, MD;  Location: [facility] OR;  Service: Urology;  Laterality: N/A;   KNEE ARTHROSCOPY Bilateral    left 10/ 2004;   right 2006   LAPAROSCOPIC GASTRIC BANDING  2009   LAPAROTOMY N/A  09/20/2021   Procedure: EXPLORATORY LAPAROTOMY,  LYSIS OF ADHESIONS, REDUCTION OF VOLVULUS;  Surgeon: Kallie Manuelita BROCKS, MD;  Location: [facility] ORS;  Service: General;  Laterality: N/A;   REMOVAL GASTRIC LAP BAND   LEFT HEART CATHETERIZATION WITH CORONARY ANGIOGRAM N/A 07/15/2013   Procedure: LEFT HEART CATHETERIZATION WITH CORONARY ANGIOGRAM;  Surgeon: Victory LELON Claudene DOUGLAS, MD;  Location: [facility] CATH LAB;  Service: Cardiovascular;  Laterality: N/A;   MELANOMA EXCISION Left 02/18/2010   @[facility]  by dr b. sebastian;   WLE MELANOMA OF LEFT FOOT   NASAL SEPTUM SURGERY     1990s   NASAL/SINUS ENDOSCOPY  09/18/2006   @[facility]  by dr d. shoemaker;   REVISION SEPTOPLASTY/ RIGHT INFERIOR TURBINATE REDUCTION/ LEFT TOTAL ETHMOIDECTOMY   PENILE PROSTHESIS IMPLANT  1996   SHOULDER SURGERY Left 2004   for staph infection   SPACE OAR INSTILLATION N/A 10/20/2023   Procedure: INJECTION, HYDROGEL SPACER;  Surgeon: Shane Steffan BROCKS, MD;  Location: [facility] OR;  Service: Urology;  Laterality: N/A;   TONSILLECTOMY AND ADENOIDECTOMY     child   TYMPANOSTOMY TUBE PLACEMENT Bilateral    1970s   Social History:  reports that he has quit smoking. His smoking use included cigarettes. He has been exposed to tobacco smoke. He quit smokeless tobacco use about 34 years ago.  His smokeless tobacco use included snuff and chew. He reports that he does not currently use alcohol. He reports that he does not use drugs.  Allergies  Allergen Reactions   Irbesartan  Shortness Of Breath and Other (See Comments)   Prednisone  Anaphylaxis, Swelling and Other (See Comments)    Swelling of the lips   Ramipril Shortness Of Breath and Other (See Comments)   Shellfish Allergy  Anaphylaxis    ALL SHELLFISH  Shrimp/  crab   Tradjenta [Linagliptin] Shortness Of Breath   Cephalexin Hives and Swelling   Clindamycin Hcl Hives and Swelling   Codeine Nausea And Vomiting and Other (See Comments)    Affects heart   Contrast Media [Iodinated Contrast Media] Other  (See Comments)    Very sick  Can be administered if Benadryl  is administered prior to prevent reaction    Fenofibrate Other (See Comments)    My skin started peeling off   Glycotrol [Support-500] Other (See Comments)    Unknown    Indomethacin Other (See Comments)    Unknown    Minocycline Hcl Hives and Swelling   Morphine And Codeine Nausea And Vomiting    Projectile vomiting   Sulfa Drugs Cross Reactors Hives and Swelling   Tetracyclines & Related Other (See Comments)    When exposed to sun, skin turned bluish    Family History  Problem Relation Age of Onset   Coronary artery disease Mother    Diabetes Father    Liver cancer Brother 58   Celiac disease Other 57       brother's daughter   Allergic rhinitis Neg Hx    Angioedema Neg Hx    Asthma Neg Hx    Atopy Neg Hx    Eczema Neg Hx    Immunodeficiency Neg Hx    Urticaria Neg Hx     Prior to Admission medications   Medication Sig Start Date End Date Taking? Authorizing Provider  Ascorbic Acid (VITAMIN C) 1000 MG tablet Take 1,000 mg by mouth daily.   Yes [provider]  aspirin  81 MG tablet Take 81 mg by mouth daily.   Yes [provider]  b complex vitamins capsule Take 1 capsule by mouth daily.   Yes [provider]  Cholecalciferol (VITAMIN D3) 25 MCG CAPS Take 1 capsule by mouth every other day.   Yes [provider]  clopidogrel  (PLAVIX ) 75 MG tablet TAKE ONE TABLET BY MOUTH ONCE DAILY. 04/23/23  Yes Court Dorn PARAS, MD  Coenzyme Q10 (COQ-10) 100 MG CAPS Take 1 capsule by mouth daily.   Yes [provider]  diphenhydrAMINE  (BENADRYL ) 25 MG tablet Take 25 mg by mouth every 6 (six) hours as needed for itching or allergies.   Yes [provider]  Flax Oil-Fish Oil-Borage Oil (FISH OIL-FLAX OIL-BORAGE OIL) CAPS Take 3,000 mg by mouth every morning.   Yes [provider]  fluticasone  (FLONASE ) 50 MCG/ACT nasal spray Place 2 sprays  into both nostrils  daily.   Yes [provider]  JANUVIA 100 MG tablet Take 100 mg by mouth daily. 03/12/22  Yes [provider]  levothyroxine  (SYNTHROID ) 50 MCG tablet Take 50 mcg by mouth daily before breakfast.   Yes [provider]  metoprolol  (TOPROL -XL) 100 MG 24 hr tablet Take 50-100 mg by mouth 2 (two) times daily. Take 1 (100 mg) tablet in the morning and 1/2 tablet (50 mg) every evening   Yes [provider]  Misc Natural Products (TURMERIC CURCUMIN) CAPS Take 1 capsule by mouth every morning.   Yes [provider]  Naphazoline-Pheniramine (EYE ALLERGY  RELIEF OP) Place 2 drops into both eyes as needed.   Yes [provider]  niacin  (NIASPAN ) 1000 MG CR tablet TAKE 1 TABLET BY MOUTH AT BEDTIME. Patient taking differently: Take 1,000 mg by mouth at bedtime. 01/30/23  Yes Court Dorn PARAS, MD  pravastatin  (PRAVACHOL ) 40 MG tablet Take 1 tablet (40 mg total) by mouth every evening. Patient taking differently: Take 40 mg by mouth every evening. 04/18/19  Yes Court Dorn PARAS, MD  tamsulosin  (FLOMAX ) 0.4 MG CAPS capsule Take 0.4 mg by mouth every evening. 07/17/20  Yes [provider]  tirzepatide CLOYDE) 2.5 MG/0.5ML Pen Inject 2.5 mg into the skin once a week.   Yes [provider]  valsartan -hydrochlorothiazide  (DIOVAN -HCT) 160-12.5 MG tablet Take 1 tablet by mouth daily. 12/18/23  Yes [provider]  azithromycin  (ZITHROMAX ) 250 MG tablet Take 250 mg by mouth. Patient not taking: Reported on 12/28/2023 12/10/23   [provider]    Physical Exam: Vitals:   12/28/23 1105 12/28/23 1115 12/28/23 1145 12/28/23 1215  BP:  (!) 172/85 (!) 167/81 (!) 149/75  Pulse:  68 70 71  Resp:      Temp: 97.9 F (36.6 C)     TempSrc: Oral     SpO2:  98% 98% 97%  Weight:      Height:       GENERAL:  A&O x 3, NAD, well developed, cooperative, follows commands HEENT: [place]/AT, No thrush, No icterus, No oral ulcers Neck:  No neck  mass, No meningismus, soft, supple CV: RRR, no S3, no S4, no rub, no JVD Lungs:  CTA, no wheeze, no rhonchi, good air movement Abd: soft/NT +BS, nondistended Ext: No edema, no lymphangitis, no cyanosis, no rashes Neuro:  CN II-XII intact, strength 4/5 in RUE, RLE, strength 4/5 LUE, LLE; sensation intact bilateral; no dysmetria; babinski equivocal  Data Reviewed: Data reviewed above in the history  Assessment and Plan: Small bowel obstruction -General Surgery consult - NG decompression - Start IV fluids - Judicious opioids  Essential hypertension - Convert metoprolol  to IV for now - Holding ARB  CKD stage IIIa - Baseline creatinine 1.2-1.4 - Monitor BMP  Coronary artery disease - History of LAD and Lcx stents 2001 and 2004 - Restented LAD 2016 - No chest pain presently - Holding aspirin  and Plavix  temporarily  Mixed hyperlipidemia -Holding statin until able to tolerate p.o.  Diabetes mellitus type 2 with hyperglycemia - NovoLog  sliding scale - 06/30/2023 hemoglobin A1c--6.4 - NovoLog  scale  Prostate cancer - Patient finished radiation approximately 2 weeks prior to admission  Obesity - BMI 33.48 - Lifestyle modification  Hypothyroidism - Restart Synthroid  once able to tolerate p.o.        Advance Care Planning: FULL  Consults: General surgery  Family Communication: none present  Severity of Illness: The appropriate patient status for this patient  is OBSERVATION. Observation status is judged to be reasonable and necessary in order to provide the required intensity of service to ensure the patient's safety. The patient's presenting symptoms, physical exam findings, and initial radiographic and laboratory data in the context of their medical condition is felt to place them at decreased risk for further clinical deterioration. Furthermore, it is anticipated that the patient will be medically stable for discharge from the hospital within 2 midnights of admission.    Author: Alm Schneider, MD 12/28/2023 12:21 PM  For on call review [email or web address].

## 2023-12-28 NOTE — Anesthesia Preprocedure Evaluation (Signed)
 Anesthesia Evaluation  Patient identified by MRN, date of birth, ID band Patient awake    Reviewed: Allergy  & Precautions, H&P , NPO status , Patient's Chart, lab work & pertinent test results, reviewed documented beta blocker date and time   Airway Mallampati: II  TM Distance: >3 FB Neck ROM: full    Dental no notable dental hx.    Pulmonary neg pulmonary ROS, sleep apnea , former smoker   Pulmonary exam normal breath sounds clear to auscultation       Cardiovascular Exercise Tolerance: Good hypertension, + angina  + CAD, + Past MI and +CHF  negative cardio ROS  Rhythm:regular Rate:Normal     Neuro/Psych  Neuromuscular disease negative neurological ROS  negative psych ROS   GI/Hepatic negative GI ROS, Neg liver ROS,,,  Endo/Other  negative endocrine ROSdiabetesHypothyroidism    Renal/GU Renal diseasenegative Renal ROS  negative genitourinary   Musculoskeletal   Abdominal   Peds  Hematology negative hematology ROS (+)   Anesthesia Other Findings   Reproductive/Obstetrics negative OB ROS                              Anesthesia Physical Anesthesia Plan  ASA: 4 and emergent  Anesthesia Plan: General and General ETT   Post-op Pain Management:    Induction:   PONV Risk Score and Plan: Ondansetron   Airway Management Planned:   Additional Equipment:   Intra-op Plan:   Post-operative Plan:   Informed Consent: I have reviewed the patients History and Physical, chart, labs and discussed the procedure including the risks, benefits and alternatives for the proposed anesthesia with the patient or authorized representative who has indicated his/her understanding and acceptance.     Dental Advisory Given  Plan Discussed with: CRNA  Anesthesia Plan Comments:         Anesthesia Quick Evaluation

## 2023-12-28 NOTE — Brief Op Note (Signed)
 12/28/2023  5:35 PM  PATIENT:  Kevin [name]  78 y.o. male  PRE-OPERATIVE DIAGNOSIS:  Small Bowel Obstruction  POST-OPERATIVE DIAGNOSIS:  Small Bowel Obstruction; 12cm ventral hernia  PROCEDURE:  Procedure(s): LAPAROTOMY, FOR LYSIS OF ADHESIONS (N/A) PRIMARY REPAIR, HERNIA, VENTRAL (N/A)  SURGEON:  Surgeons and Role:    * Cleavon Goldman, Dorothyann LABOR, DO - Primary  PHYSICIAN ASSISTANT:   ASSISTANTS: Montie Seltzer, RNFA   ANESTHESIA:   general  EBL:  20 cc   BLOOD ADMINISTERED:none  DRAINS: none   LOCAL MEDICATIONS USED:  MARCAINE      SPECIMEN:  No Specimen  DISPOSITION OF SPECIMEN:  N/A  COUNTS:  YES  DICTATION: .Note written in EPIC  PLAN OF CARE: Admit to inpatient   PATIENT DISPOSITION:  PACU - hemodynamically stable.   Delay start of Pharmacological VTE agent (>24hrs) due to surgical blood loss or risk of bleeding: no  Dorothyann Brittle, DO [facility] [address] [place] [address], [place] [address] [phone] (office)

## 2023-12-28 NOTE — ED Triage Notes (Addendum)
 Patient come in EMS from home for complaint of abdominal pain, that radiates to back , denise vomiting, has been dry heaves. Stated this all started Sunday, started taking mounjaro Saturday. Rates pain 8/10, EMS 169 CBG, BP 186/81,  pulse 69, Oxygen 97%  on room air.

## 2023-12-28 NOTE — Op Note (Signed)
 [facility] Operative Note  12/28/23  Preoperative Diagnosis: small bowel obstruction, concern for internal hernia   Postoperative Diagnosis: small bowel obstruction, intra-abdominal adhesions, 12 cm ventral hernia   Procedure(s) Performed: Exploratory laparotomy, lysis of adhesions, primary repair of ventral hernia   Surgeon: Dorothyann Brittle, DO    Assistants: Montie Eke, RNFA   Anesthesia: General endotracheal   Anesthesiologist: Kendell Yvonna PARAS, MD    Specimens: None   Estimated Blood Loss: 20 cc   Blood Replacement: None    Complications: None   Wound Class: Clean   Operative Indications: Patient is a 78 year old male who was admitted to the hospital with concern for small bowel obstruction.  CT demonstrated concern for possible internal hernia, so decision was made to take the patient to the operating room.  Patient is agreeable to surgery.  All risks and benefits of performing this procedure were discussed with the patient including pain, infection, bleeding, damage to the surrounding structures, need for more procedures or surgery, and hernia recurrence. The patient voiced understanding of the procedure, all questions were sought and answered, and consent was obtained.  Findings: -Multiple interloop intra-abdominal adhesions causing 2 different obstructing points -12 cm ventral hernia, primarily repaired with interrupted 0 Ethibond -Hemostasis noted at completion of case   Procedure: The patient was taken to the operating room and placed supine. General endotracheal anesthesia was induced. Intravenous antibiotics were administered per protocol.  An nasogastric tube positioned to decompress the stomach. The abdomen was prepared and draped in the usual sterile fashion.  A time-out was completed verifying correct patient, procedure, site, positioning, and implant(s) and/or special equipment prior to beginning this procedure.  A vertical midline  incision was made from just above the previous laparotomy incision to just above the umbilicus.  This was deepened through the subcutaneous tissues, and hemostasis was achieved with electrocautery.  The linea alba was identified and incised at the superior aspect of the previous incision.  The abdominal cavity was entered with care.  There were no adhesions to the anterior abdominal wall.  The hernia sac was then opened.  An Alexis wound protector was placed.  Upon entering the abdominal cavity, there were extensive interloop adhesions resulting in dilated small bowel.  There were at least 2 different transition zones from interloop adhesions.  These adhesions were taken down sharply with Metzenbaum scissors.  The small bowel was then run from the ligament of Treitz to the ileocecal valve, and additional interloop adhesions were taken down sharply while running this area.  The entire small bowel was then inspected for viability and absence of any additional obstructing bands.  All of the small bowel appeared viable.  A small patch of deserosalized bowel was imbricated with 3-0 silk in a Lembert fashion.  Small bowel contents were then milked proximally to be aspirated by the NG tube.  NG tube location was confirmed within the stomach.  The abdominal cavity was then copiously irrigated with warm saline, and hemostasis was noted.  Attention was then turned to the anterior abdominal wall and the ventral hernia.  Decision was made to not use mesh given patient's active Plavix  and concern for further bleeding or complications.  The ventral hernia was noted to measure 12 cm.  The fascia was identified and closed with 0 Ethibond in a interrupted figure-of-eight fashion.  The hernia sac was partially excised from the subcutaneous tissues.  Hemostasis was achieved.  The incision was localized with Marcaine .  Skin was closed with skin staples.  A honeycomb dressing was applied.  Final inspection revealed acceptable  hemostasis. All counts were correct at the end of the case. The patient was awakened from anesthesia and extubated without complication.  The patient went to the PACU in stable condition.   Dorothyann Brittle, DO  [facility] [address] [place], [place] [address] [phone] (office)

## 2023-12-28 NOTE — ED Notes (Signed)
 Patient transported to CT

## 2023-12-28 NOTE — ED Notes (Signed)
 ED TO INPATIENT HANDOFF REPORT  ED Nurse Name and Phone #: Warren Jubilee, RN [phone]  S Name/Age/Gender Kevin [name] 78 y.o. male Room/Bed: APA19/APA19  Code Status   Code Status: Prior  Home/SNF/Other Home Patient oriented to: self, place, time, and situation Is this baseline? Yes   Triage Complete: Triage complete  Chief Complaint Abd pain, Back pain  Triage Note Patient come in EMS from home for complaint of abdominal pain, that radiates to back , denise vomiting, has been dry heaves. Stated this all started Sunday, started taking mounjaro Saturday. Rates pain 8/10, EMS 169 CBG, BP 186/81,  pulse 69, Oxygen 97%  on room air.    Allergies Allergies  Allergen Reactions   Irbesartan  Shortness Of Breath and Other (See Comments)   Prednisone  Anaphylaxis, Swelling and Other (See Comments)    Swelling of the lips   Ramipril Shortness Of Breath and Other (See Comments)   Shellfish Allergy  Anaphylaxis    ALL SHELLFISH  Shrimp/  crab   Tradjenta [Linagliptin] Shortness Of Breath   Cephalexin Hives and Swelling   Clindamycin Hcl Hives and Swelling   Codeine Nausea And Vomiting and Other (See Comments)    Affects heart   Contrast Media [Iodinated Contrast Media] Other (See Comments)    Very sick  Can be administered if Benadryl  is administered prior to prevent reaction    Fenofibrate Other (See Comments)    My skin started peeling off   Glycotrol [Support-500] Other (See Comments)    Unknown    Indomethacin Other (See Comments)    Unknown    Minocycline Hcl Hives and Swelling   Morphine And Codeine Nausea And Vomiting    Projectile vomiting   Sulfa Drugs Cross Reactors Hives and Swelling   Tetracyclines & Related Other (See Comments)    When exposed to sun, skin turned bluish    Level of Care/Admitting Diagnosis ED Disposition     ED Disposition  Admit   Condition  --   Comment  The patient appears reasonably stabilized for admission considering the  current resources, flow, and capabilities available in the ED at this time, and I doubt any other [facility] requiring further screening and/or treatment in the ED prior to admission is  present.          B Medical/Surgery History Past Medical History:  Diagnosis Date   Aortic atherosclerosis (HCC) 09/15/2023   Benign localized prostatic hyperplasia with lower urinary tract symptoms (LUTS)    CAD (coronary artery disease) 2001   cardiologist-- dr court;    11/ 2001  acute anferior MI  s/p cath w/ PCI ostial diag & PTCA & BMS to pLAD;   Cath 12/ 2004 stent to mCFx;  cath 02/ 2009 PTCA BMS to dCFx & PTCA/ [place] angio DES to mCFx; cath 07/2013 TO nondominant RCA/ patent LAD& CFx stents;  cath 10/ 2016 DES to mLAD for ISR;  NUC 11-12-2016 LR w/ normal perfusion, nuclear ef 59%   Carpal tunnel syndrome, bilateral    Chronic diastolic CHF (congestive heart failure) (HCC) 07/2013   Chronic rhinitis    Chronic venous stasis    history s/p left GSV ablation yrs ago   CKD (chronic kidney disease), stage III (HCC)    Diverticulosis of colon    Edema of both lower extremities    Environmental and seasonal allergies    History of acute anterior wall MI 2001   History of malignant melanoma 02/2010   previously followed by oncology-- dr  ennevor (released 2017)  dx 10/ 2011;  02-18-2010  s/p WLE left foot ;   Stage IA,  no SLN bx,  no other treatment and no recurrence   History of non-ST elevation myocardial infarction (NSTEMI) 02/13/2015   History of small bowel obstruction 09/20/2021   s/p  surgical intervention   HTN (hypertension)    Hyperlipidemia, mixed    Hypothyroidism    Incomplete emptying of bladder    Malignant neoplasm prostate (HCC) 08/2023   primary urology--- dr showalter/  radiation oncology--- dr patrcia;   dx 04/ 2025,  Gleason 4+3,  PSA 15.2   OA (osteoarthritis)    knees   OSA (obstructive sleep apnea)    10-15-2023  pt stated does use CPAP since 2023 after losing 140 lb    Psoriasis    S/p bare metal coronary artery stent    11/ 2001  BMS to pLAD;     02/ 2009 BMS to distal dominant CFx   S/P drug eluting coronary stent placement    02/ 2009  DES to mCFx;  DES to mLAD   Type 2 diabetes mellitus (HCC)    followed by pcp   (10-15-2023  pt stated does check blood sugars at home)   Ventral hernia without obstruction or gangrene    followed by general surgeon--- dr l. bridges   Weak urine stream    Past Surgical History:  Procedure Laterality Date   APPENDECTOMY     1950s   CARDIAC CATHETERIZATION N/A 02/14/2015   Procedure: Left Heart Cath and Coronary Angiography;  Surgeon: Candyce GORMAN Reek, MD;  Location: [facility] INVASIVE CV LAB;  Service: Cardiovascular;  Laterality: N/A;   CARDIAC CATHETERIZATION N/A 02/14/2015   Procedure: Coronary Stent Intervention;  Surgeon: Candyce GORMAN Reek, MD;  Location: [facility] INVASIVE CV LAB;  Service: Cardiovascular;  Laterality: N/A;   CATARACT EXTRACTION W/ INTRAOCULAR LENS IMPLANT Bilateral 2007   COLONOSCOPY N/A 10/09/2010   normal rectum, long redundant colon, pancolonic diverticula, 6mm cecal polyp, diminutive polyp at the appendiceal orifice ablated with tip of hot snare (Biopsies suggestive of microscopic colitis; polyp benign; small bowel ok)   CORONARY ANGIOPLASTY WITH STENT PLACEMENT  06/11/2007   @[facility]   by dr ladona;   PTCA & BMS distal CX, PTCA & balloon angioplasty in-stent restenotic & DES to mid CX   CORONARY ANGIOPLASTY WITH STENT PLACEMENT  03/2000   @ [facility]  by dr b. obie;  PCI to ostial diagnal ;  PTCA and BMS to pLAD   CORONARY ANGIOPLASTY WITH STENT PLACEMENT  04/2003   @[facility]  by dr wyn;  stent to midCFx   ESOPHAGOGASTRODUODENOSCOPY N/A 10/09/2010   Normal esophagus, lap band present, 2nd portion duodenum with erosions without frank ulcer   GASTRIC BANDING PORT REVISION N/A 09/20/2021   Procedure: GASTRIC BAND REMOVAL;  Surgeon: Kallie Manuelita BROCKS, MD;  Location: [facility] ORS;  Service: General;  Laterality: N/A;    GOLD SEED IMPLANT N/A 10/20/2023   Procedure: INSERTION, GOLD SEEDS;  Surgeon: Shane Steffan BROCKS, MD;  Location: [facility] OR;  Service: Urology;  Laterality: N/A;   KNEE ARTHROSCOPY Bilateral    left 10/ 2004;   right 2006   LAPAROSCOPIC GASTRIC BANDING  2009   LAPAROTOMY N/A 09/20/2021   Procedure: EXPLORATORY LAPAROTOMY,  LYSIS OF ADHESIONS, REDUCTION OF VOLVULUS;  Surgeon: Kallie Manuelita BROCKS, MD;  Location: [facility];  Service: General;  Laterality: N/A;   REMOVAL GASTRIC LAP BAND   LEFT HEART CATHETERIZATION WITH CORONARY ANGIOGRAM N/A 07/15/2013  Procedure: LEFT HEART CATHETERIZATION WITH CORONARY ANGIOGRAM;  Surgeon: Victory LELON Claudene DOUGLAS, MD;  Location: [facility] CATH LAB;  Service: Cardiovascular;  Laterality: N/A;   MELANOMA EXCISION Left 02/18/2010   @[facility]  by dr b. sebastian;   WLE MELANOMA OF LEFT FOOT   NASAL SEPTUM SURGERY     1990s   NASAL/SINUS ENDOSCOPY  09/18/2006   @[facility]  by dr d. shoemaker;   REVISION SEPTOPLASTY/ RIGHT INFERIOR TURBINATE REDUCTION/ LEFT TOTAL ETHMOIDECTOMY   PENILE PROSTHESIS IMPLANT  1996   SHOULDER SURGERY Left 2004   for staph infection   SPACE OAR INSTILLATION N/A 10/20/2023   Procedure: INJECTION, HYDROGEL SPACER;  Surgeon: Shane Steffan BROCKS, MD;  Location: [facility] OR;  Service: Urology;  Laterality: N/A;   TONSILLECTOMY AND ADENOIDECTOMY     child   TYMPANOSTOMY TUBE PLACEMENT Bilateral    1970s     A IV Location/Drains/Wounds Patient Lines/Drains/Airways Status     Active Line/Drains/Airways     Name Placement date Placement time Site Days   Peripheral IV 12/28/23 20 G 1.88 Anterior;Proximal;Right Forearm 12/28/23  0939  Forearm  less than 1   NG/OG Vented/Dual Lumen 16 Fr. Right nare Marking at nare/corner of mouth 63 cm 12/28/23  1149  Right nare  less than 1   Wound 10/20/23 0723 Surgical Closed Surgical Incision Perineum Other (Comment) 10/20/23  0723  Perineum  69            Intake/Output Last 24 hours No intake or output data in the 24 hours ending  12/28/23 1207  Labs/Imaging Results for orders placed or performed during the hospital encounter of 12/28/23 (from the past 48 hours)  Lipase, blood     Status: None   Collection Time: 12/28/23  7:55 AM  Result Value Ref Range   Lipase 27 11 - 51 U/L    Comment: Performed at [facility], [address] [place], [place] [address]  Comprehensive metabolic panel     Status: Abnormal   Collection Time: 12/28/23  7:55 AM  Result Value Ref Range   Sodium 130 (L) 135 - 145 mmol/L   Potassium 4.4 3.5 - 5.1 mmol/L   Chloride 94 (L) 98 - 111 mmol/L   CO2 24 22 - 32 mmol/L   Glucose, Bld 148 (H) 70 - 99 mg/dL    Comment: Glucose reference range applies only to samples taken after fasting for at least 8 hours.   BUN 29 (H) 8 - 23 mg/dL   Creatinine, Ser 8.32 (H) 0.61 - 1.24 mg/dL   Calcium 9.8 8.9 - 89.6 mg/dL   Total Protein 6.8 6.5 - 8.1 g/dL   Albumin  3.9 3.5 - 5.0 g/dL   AST 13 (L) 15 - 41 U/L   ALT 12 0 - 44 U/L   Alkaline Phosphatase 46 38 - 126 U/L   Total Bilirubin 1.1 0.0 - 1.2 mg/dL   GFR, Estimated 42 (L) >60 mL/min    Comment: (NOTE) Calculated using the CKD-EPI Creatinine Equation (2021)    Anion gap 12 5 - 15    Comment: Performed at [facility], [address] [place], [place] [address]  CBC     Status: Abnormal   Collection Time: 12/28/23  7:55 AM  Result Value Ref Range   WBC 5.9 4.0 - 10.5 K/uL   RBC 3.05 (L) 4.22 - 5.81 MIL/uL   Hemoglobin 9.9 (L) 13.0 - 17.0 g/dL   HCT 70.1 (L) 60.9 - 47.9 %   MCV 97.7 80.0 -  100.0 fL   MCH 32.5 26.0 - 34.0 pg   MCHC 33.2 30.0 - 36.0 g/dL   RDW 86.8 88.4 - 84.4 %   Platelets 121 (L) 150 - 400 K/uL   nRBC 0.0 0.0 - 0.2 %    Comment: Performed at [facility], [address], [place], [place] [address]  Urinalysis, Routine w reflex microscopic -Urine, Clean Catch     Status: Abnormal   Collection Time: 12/28/23  9:22 AM  Result Value Ref Range   Color, Urine STRAW (A) YELLOW   APPearance CLEAR CLEAR   Specific Gravity, Urine  1.010 1.005 - 1.030   pH 7.0 5.0 - 8.0   Glucose, UA NEGATIVE NEGATIVE mg/dL   Hgb urine dipstick NEGATIVE NEGATIVE   Bilirubin Urine NEGATIVE NEGATIVE   Ketones, ur NEGATIVE NEGATIVE mg/dL   Protein, ur NEGATIVE NEGATIVE mg/dL   Nitrite NEGATIVE NEGATIVE   Leukocytes,Ua NEGATIVE NEGATIVE    Comment: Performed at [facility], [address], [place], [place] [address]  Lactic acid, plasma     Status: None   Collection Time: 12/28/23 11:11 AM  Result Value Ref Range   Lactic Acid, Venous 1.0 0.5 - 1.9 mmol/L    Comment: Performed at [facility], [address], [place], [place] [address]   CT ABDOMEN PELVIS WO CONTRAST Result Date: 12/28/2023 CLINICAL DATA:  Abdominal pain, acute, nonlocalized. EXAM: CT ABDOMEN AND PELVIS WITHOUT CONTRAST TECHNIQUE: Multidetector CT imaging of the abdomen and pelvis was performed following the standard protocol without IV contrast. RADIATION DOSE REDUCTION: This exam was performed according to the departmental dose-optimization program which includes automated exposure control, adjustment of the mA and/or kV according to patient size and/or use of iterative reconstruction technique. COMPARISON:  CT 11/03/2023 FINDINGS: Lower chest: Lung bases are clear. Small amount of pericardial fluid. Hepatobiliary: Again noted is trace perihepatic ascites. Cholelithiasis. No significant gallbladder distension. No biliary dilatation. Pancreas: Unremarkable. No pancreatic ductal dilatation or surrounding inflammatory changes. Spleen: Again noted is a small peripherally calcified low-density structure in the medial spleen on image 18/2. Suspect this is an incidental finding. Spleen size is normal. Adrenals/Urinary Tract: Normal adrenal glands. Normal appearance of both kidneys without hydronephrosis. Negative for kidney stones. No suspicious renal lesions. Normal appearance of the urinary bladder. Stomach/Bowel: Dilated loops small bowel with some adjacent mesenteric edema. There is  a small bowel transition point on image 47/2. Etiology could represent adhesions versus internal hernia. Small pockets of peripheral gas in small bowel loops in the mid lower abdomen with adjacent mesenteric edema. Findings raise concern for possible pneumatosis in these areas. Scattered colonic diverticula without colonic inflammation. Small bowel loops associated with midline ventral hernia. No significant gastric distension. Proximal small bowel is decompressed. Vascular/Lymphatic: Aortic atherosclerosis. Mildly prominent left external iliac lymph node on image 78/2 measures 1.2 cm in the short axis and minimal change from the recent comparison examination. Previously this lymph node was PET avid. Reproductive: Enlarged prostate with fiducial markers. Penile prosthesis implants. Other: Trace fluid in the pelvis. Trace perihepatic ascites. Large ventral hernia containing loops of dilated small bowel. Negative for pneumoperitoneum. Musculoskeletal: Disc space narrowing at L2-L3. No acute bone abnormality. IMPRESSION: 1. Dilated small bowel loops compatible with a small bowel obstruction. There is a small amount of mesenteric edema adjacent to dilated small bowel loops and cannot exclude developing pneumatosis. Transition point in small bowel is identified in the mid abdomen. There are decompressed small bowel loops proximal and distal to the dilated loops of  small bowel which raises concern for an internal hernia as the etiology for the small bowel obstruction. 2. Large ventral hernia containing loops of dilated small bowel but the ventral hernia does not appear to be the cause for the small bowel obstruction. 3. Small amount of abdominopelvic ascites. 4.  Aortic Atherosclerosis (ICD10-I70.0). 5. Cholelithiasis. 6. Mildly prominent left external iliac lymph node has minimally changed. Critical Value/emergent results were called by telephone at the time of interpretation on 12/28/2023 at 10:27 am to provider [facility]  ZAMMIT , who verbally acknowledged these results. Electronically Signed   By: Juliene Balder M.D.   On: 12/28/2023 11:29    Pending Labs Unresulted Labs (From admission, onward)     Start     Ordered   12/28/23 1028  Lactic acid, plasma  Now then every 2 hours,   R (with STAT occurrences)      12/28/23 1028            Vitals/Pain Today's Vitals   12/28/23 1030 12/28/23 1045 12/28/23 1100 12/28/23 1105  BP: (!) 151/70 (!) 159/70 (!) 163/71   Pulse: 64 63 65   Resp:      Temp:    97.9 F (36.6 C)  TempSrc:    Oral  SpO2: 94% 91% 99%   Weight:      Height:      PainSc:        Isolation Precautions No active isolations  Medications Medications  sodium chloride  0.9 % bolus 1,000 mL (1,000 mLs Intravenous Bolus 12/28/23 0939)  HYDROmorphone  (DILAUDID ) injection 0.5 mg (0.5 mg Intravenous Given 12/28/23 0941)  ondansetron  (ZOFRAN ) injection 4 mg (4 mg Intravenous Given 12/28/23 0941)    Mobility walks with person assist     Focused Assessments Pulmonary Assessment Handoff:  Lung sounds:   O2 Device: Room Air      R Recommendations: See Admitting Provider Note  Report given to:   Additional Notes:  Patient has NG tube to intermittent suction. 63cm at the nose. Patient uses a urinal.

## 2023-12-28 NOTE — Progress Notes (Signed)
 [facility]  Spoke with the patient's niece on the phone.  I explained that he tolerated the procedure without difficulty.  He had multiple adhesive bands that were causing an obstruction, and this is likely from his previous surgeries.  He will stay in the hospital till he has bowel function is tolerating solid food.  All questions were answered to her expressed satisfaction.  Plan: -Return to room on the floor -NPO -NG to LIS -Maintain Foley catheter.  Can likely discontinue tomorrow -IV fluids -Monitor for return of bowel function -PRN pain control and antiemetics -No need for further antibiotics from a surgical standpoint -Appreciate hospitalist recommendations  Dorothyann Brittle, DO [facility] [address] [place], [place] [address] [phone] (office)

## 2023-12-28 NOTE — Progress Notes (Signed)
 Responded to nursing call:  upper abd pain, cp   Subjective: Pt had some cp, abd pain, some diaphoresis.  Lasted about 5 min.  No n/v  Vitals:   12/28/23 1115 12/28/23 1145 12/28/23 1215 12/28/23 1254  BP: (!) 172/85 (!) 167/81 (!) 149/75 (!) 148/74  Pulse: 68 70 71 76  Resp:    19  Temp:      TempSrc:      SpO2: 98% 98% 97% 97%  Weight:    107.7 kg  Height:    5' 9.5 (1.765 m)   CV--RRR Lung--CTA Abd--soft+BS/mild upper abd tender   Assessment/Plan: Abdominal pain/CP -BP dropped to 104/79 -personally reviewed EKG--sinus, no STT changes -CP likely GI etiology -give LR bolus -check troponin- -currently CP free at time of evaluation -hold lopressor  for now     Alm Schneider, DO [facility]

## 2023-12-28 NOTE — ED Provider Notes (Signed)
 [place] EMERGENCY DEPARTMENT AT [facility] Provider Note   CSN: [id number] Arrival date & time: 12/28/23  9296     Patient presents with: Abdominal Pain   Kevin [name] is a 78 y.o. male.   Patient complains of nausea and vomiting since Sunday.  Patient has a history of small bowel obstruction before along with coronary artery disease diabetes and prostate cancer.  He took Mounjaro shot on Saturday and has not had it for 2 months  The history is provided by the patient and medical records. No language interpreter was used.  Abdominal Pain Pain location:  Epigastric Pain quality: aching   Pain radiates to:  Does not radiate Pain severity:  Mild Onset quality:  Sudden Timing:  Constant Progression:  Waxing and waning Chronicity:  New Context: not alcohol use   Associated symptoms: vomiting   Associated symptoms: no chest pain, no cough, no diarrhea, no fatigue and no hematuria        Prior to Admission medications   Medication Sig Start Date End Date Taking? Authorizing Provider  Ascorbic Acid (VITAMIN C) 1000 MG tablet Take 1,000 mg by mouth daily.   Yes [provider]  aspirin  81 MG tablet Take 81 mg by mouth daily.   Yes [provider]  b complex vitamins capsule Take 1 capsule by mouth daily.   Yes [provider]  Cholecalciferol (VITAMIN D3) 25 MCG CAPS Take 1 capsule by mouth every other day.   Yes [provider]  clopidogrel  (PLAVIX ) 75 MG tablet TAKE ONE TABLET BY MOUTH ONCE DAILY. 04/23/23  Yes Court Dorn PARAS, MD  Coenzyme Q10 (COQ-10) 100 MG CAPS Take 1 capsule by mouth daily.   Yes [provider]  diphenhydrAMINE  (BENADRYL ) 25 MG tablet Take 25 mg by mouth every 6 (six) hours as needed for itching or allergies.   Yes [provider]  Flax Oil-Fish Oil-Borage Oil (FISH OIL-FLAX OIL-BORAGE OIL) CAPS Take 3,000 mg by mouth every morning.   Yes [provider]  fluticasone  (FLONASE ) 50  MCG/ACT nasal spray Place 2 sprays into both nostrils daily.   Yes [provider]  JANUVIA 100 MG tablet Take 100 mg by mouth daily. 03/12/22  Yes [provider]  levothyroxine  (SYNTHROID ) 50 MCG tablet Take 50 mcg by mouth daily before breakfast.   Yes [provider]  metoprolol  (TOPROL -XL) 100 MG 24 hr tablet Take 50-100 mg by mouth 2 (two) times daily. Take 1 (100 mg) tablet in the morning and 1/2 tablet (50 mg) every evening   Yes [provider]  Misc Natural Products (TURMERIC CURCUMIN) CAPS Take 1 capsule by mouth every morning.   Yes [provider]  Naphazoline-Pheniramine (EYE ALLERGY  RELIEF OP) Place 2 drops into both eyes as needed.   Yes [provider]  niacin  (NIASPAN ) 1000 MG CR tablet TAKE 1 TABLET BY MOUTH AT BEDTIME. Patient taking differently: Take 1,000 mg by mouth at bedtime. 01/30/23  Yes Court Dorn PARAS, MD  pravastatin  (PRAVACHOL ) 40 MG tablet Take 1 tablet (40 mg total) by mouth every evening. Patient taking differently: Take 40 mg by mouth every evening. 04/18/19  Yes Court Dorn PARAS, MD  tamsulosin  (FLOMAX ) 0.4 MG CAPS capsule Take 0.4 mg by mouth every evening. 07/17/20  Yes [provider]  tirzepatide CLOYDE) 2.5 MG/0.5ML Pen Inject 2.5 mg into the skin once a week.   Yes [provider]  valsartan -hydrochlorothiazide  (DIOVAN -HCT) 160-12.5 MG tablet Take 1 tablet by  mouth daily. 12/18/23  Yes [provider]  azithromycin  (ZITHROMAX ) 250 MG tablet Take 250 mg by mouth. Patient not taking: Reported on 12/28/2023 12/10/23   [provider]    Allergies: Irbesartan , Prednisone , Ramipril, Shellfish allergy , Tradjenta [linagliptin], Cephalexin, Clindamycin hcl, Codeine, Contrast media [iodinated contrast media], Fenofibrate, Glycotrol [support-500], Indomethacin, Minocycline hcl, Morphine and codeine, Sulfa drugs cross reactors, and Tetracyclines & related    Review of Systems   Constitutional:  Negative for appetite change and fatigue.  HENT:  Negative for congestion, ear discharge and sinus pressure.   Eyes:  Negative for discharge.  Respiratory:  Negative for cough.   Cardiovascular:  Negative for chest pain.  Gastrointestinal:  Positive for abdominal pain and vomiting. Negative for diarrhea.  Genitourinary:  Negative for frequency and hematuria.  Musculoskeletal:  Negative for back pain.  Skin:  Negative for rash.  Neurological:  Negative for seizures and headaches.  Psychiatric/Behavioral:  Negative for hallucinations.     Updated Vital Signs BP (!) 163/71   Pulse 65   Temp 97.9 F (36.6 C) (Oral)   Resp 16   Ht 5' 9.5 (1.765 m)   Wt 104.3 kg   SpO2 99%   BMI 33.48 kg/m   Physical Exam Vitals and nursing note reviewed.  Constitutional:      Appearance: He is well-developed.  HENT:     Head: Normocephalic.     Nose: Nose normal.  Eyes:     General: No scleral icterus.    Conjunctiva/sclera: Conjunctivae normal.  Neck:     Thyroid : No thyromegaly.  Cardiovascular:     Rate and Rhythm: Normal rate and regular rhythm.     Heart sounds: No murmur heard.    No friction rub. No gallop.  Pulmonary:     Breath sounds: No stridor. No wheezing or rales.  Chest:     Chest wall: No tenderness.  Abdominal:     General: There is no distension.     Tenderness: There is abdominal tenderness. There is no rebound.  Musculoskeletal:        General: Normal range of motion.     Cervical back: Neck supple.  Lymphadenopathy:     Cervical: No cervical adenopathy.  Skin:    Findings: No erythema or rash.  Neurological:     Mental Status: He is alert and oriented to person, place, and time.     Motor: No abnormal muscle tone.     Coordination: Coordination normal.  Psychiatric:        Behavior: Behavior normal.     (all labs ordered are listed, but only abnormal results are displayed) Labs Reviewed  COMPREHENSIVE METABOLIC PANEL WITH GFR -  Abnormal; Notable for the following components:      Result Value   Sodium 130 (*)    Chloride 94 (*)    Glucose, Bld 148 (*)    BUN 29 (*)    Creatinine, Ser 1.67 (*)    AST 13 (*)    GFR, Estimated 42 (*)    All other components within normal limits  CBC - Abnormal; Notable for the following components:   RBC 3.05 (*)    Hemoglobin 9.9 (*)    HCT 29.8 (*)    Platelets 121 (*)    All other components within normal limits  URINALYSIS, ROUTINE W REFLEX MICROSCOPIC - Abnormal; Notable for the following components:   Color, Urine STRAW (*)    All other components within normal limits  LIPASE, BLOOD  LACTIC ACID, PLASMA  LACTIC ACID, PLASMA    EKG: None  Radiology: CT ABDOMEN PELVIS WO CONTRAST Result Date: 12/28/2023 CLINICAL DATA:  Abdominal pain, acute, nonlocalized. EXAM: CT ABDOMEN AND PELVIS WITHOUT CONTRAST TECHNIQUE: Multidetector CT imaging of the abdomen and pelvis was performed following the standard protocol without IV contrast. RADIATION DOSE REDUCTION: This exam was performed according to the departmental dose-optimization program which includes automated exposure control, adjustment of the mA and/or kV according to patient size and/or use of iterative reconstruction technique. COMPARISON:  CT 11/03/2023 FINDINGS: Lower chest: Lung bases are clear. Small amount of pericardial fluid. Hepatobiliary: Again noted is trace perihepatic ascites. Cholelithiasis. No significant gallbladder distension. No biliary dilatation. Pancreas: Unremarkable. No pancreatic ductal dilatation or surrounding inflammatory changes. Spleen: Again noted is a small peripherally calcified low-density structure in the medial spleen on image 18/2. Suspect this is an incidental finding. Spleen size is normal. Adrenals/Urinary Tract: Normal adrenal glands. Normal appearance of both kidneys without hydronephrosis. Negative for kidney stones. No suspicious renal lesions. Normal appearance of the urinary bladder.  Stomach/Bowel: Dilated loops small bowel with some adjacent mesenteric edema. There is a small bowel transition point on image 47/2. Etiology could represent adhesions versus internal hernia. Small pockets of peripheral gas in small bowel loops in the mid lower abdomen with adjacent mesenteric edema. Findings raise concern for possible pneumatosis in these areas. Scattered colonic diverticula without colonic inflammation. Small bowel loops associated with midline ventral hernia. No significant gastric distension. Proximal small bowel is decompressed. Vascular/Lymphatic: Aortic atherosclerosis. Mildly prominent left external iliac lymph node on image 78/2 measures 1.2 cm in the short axis and minimal change from the recent comparison examination. Previously this lymph node was PET avid. Reproductive: Enlarged prostate with fiducial markers. Penile prosthesis implants. Other: Trace fluid in the pelvis. Trace perihepatic ascites. Large ventral hernia containing loops of dilated small bowel. Negative for pneumoperitoneum. Musculoskeletal: Disc space narrowing at L2-L3. No acute bone abnormality. IMPRESSION: 1. Dilated small bowel loops compatible with a small bowel obstruction. There is a small amount of mesenteric edema adjacent to dilated small bowel loops and cannot exclude developing pneumatosis. Transition point in small bowel is identified in the mid abdomen. There are decompressed small bowel loops proximal and distal to the dilated loops of small bowel which raises concern for an internal hernia as the etiology for the small bowel obstruction. 2. Large ventral hernia containing loops of dilated small bowel but the ventral hernia does not appear to be the cause for the small bowel obstruction. 3. Small amount of abdominopelvic ascites. 4.  Aortic Atherosclerosis (ICD10-I70.0). 5. Cholelithiasis. 6. Mildly prominent left external iliac lymph node has minimally changed. Critical Value/emergent results were called  by telephone at the time of interpretation on 12/28/2023 at 10:27 am to provider [facility] Sharon Stapel , who verbally acknowledged these results. Electronically Signed   By: Juliene Balder M.D.   On: 12/28/2023 11:29     Procedures   Medications Ordered in the ED  sodium chloride  0.9 % bolus 1,000 mL (1,000 mLs Intravenous Bolus 12/28/23 0939)  HYDROmorphone  (DILAUDID ) injection 0.5 mg (0.5 mg Intravenous Given 12/28/23 0941)  ondansetron  (ZOFRAN ) injection 4 mg (4 mg Intravenous Given 12/28/23 0941)   Patient with small bowel obstruction.  I spoke with general surgery and they would like medicine to admit and the patient is to get NG tube  Medical Decision Making Amount and/or Complexity of Data Reviewed Labs: ordered. Radiology: ordered.  Risk Prescription drug management. Decision regarding hospitalization.   Small bowel obstruction..  Patient will be admitted to medicine with surgery consult     Final diagnoses:  Small bowel obstruction [facility])    ED Discharge Orders     None          Suzette Pac, MD 12/28/23 1643

## 2023-12-28 NOTE — Anesthesia Procedure Notes (Signed)
 Procedure Name: Intubation Date/Time: 12/28/2023 3:46 PM  Performed by: Para Jerelene CROME, CRNAPre-anesthesia Checklist: Patient identified, Emergency Drugs available, Suction available and Patient being monitored Patient Re-evaluated:Patient Re-evaluated prior to induction Oxygen Delivery Method: Circle system utilized Preoxygenation: Pre-oxygenation with 100% oxygen Induction Type: IV induction Ventilation: Mask ventilation without difficulty Laryngoscope Size: Mac and 4 Grade View: Grade II Tube type: Oral Tube size: 7.5 mm Number of attempts: 1 Airway Equipment and Method: Stylet Placement Confirmation: ETT inserted through vocal cords under direct vision, positive ETCO2, CO2 detector and breath sounds checked- equal and bilateral Secured at: 23 cm Tube secured with: Tape Dental Injury: Teeth and Oropharynx as per pre-operative assessment  Comments: Pre existing NGT connected to suction prior to induction. Approximately 5 ml of brown gastric content suctioned. Atraumatic intubation x 1. Lips and teeth remain in preoperative condition.

## 2023-12-28 NOTE — Plan of Care (Signed)
   Problem: Education: Goal: Knowledge of General Education information will improve Description: Including pain rating scale, medication(s)/side effects and non-pharmacologic comfort measures Outcome: Progressing   Problem: Clinical Measurements: Goal: Ability to maintain clinical measurements within normal limits will improve Outcome: Progressing Goal: Diagnostic test results will improve Outcome: Progressing

## 2023-12-28 NOTE — Consult Note (Signed)
 [facility] Consult  Reason for Consult: small bowel obstruction Referring Physician: Dr. Suzette  Chief Complaint   Abdominal Pain     HPI: Kevin [name] is a 78 y.o. male who presents to the hospital with a 1 day history of abdominal pain and nausea.  He restarted taking his Mounjaro on Saturday, and subsequently began developing worsening abdominal pain that resulted in him presenting to the emergency department today.  He confirms nausea without any episodes of emesis.  He last had a bowel movement early this morning at 4 AM, and last passed flatus earlier this morning at 6 AM.  His abdominal pain has mostly resolved at this time, but he just received pain medication.  There was concern that he was having chest pain earlier, however he believes that this was radiating up from his abdomen.  His EKG was normal and troponin is normal.  His past medical history significant for aortic atherosclerosis, coronary artery disease status post stent placements, CHF, CKD stage III, hypertension, hyperlipidemia, obstructive sleep apnea, hypothyroidism, and diabetes.  He takes Plavix , with his last dose being earlier today.  His surgical history is significant for an open appendectomy when he was a child, lap band placement, and ex lap in 2023 for small bowel obstruction and removal of lap band.  In the emergency department, he was noted to be hemodynamically stable.  His blood work was unremarkable, and lactic acid has been normal x 2.  He underwent a CT of the abdomen and pelvis which demonstrated dilated small bowel loops compatible with small bowel obstruction, with a small amount of mesenteric edema adjacent to the dilated loops with possible developing pneumatosis.  It was also noted they had a transition point in the mid abdomen with decompressed small bowel proximal and distal, concerning for possible internal hernia.  He also has a large ventral hernia that is not the cause of his  obstruction.  Past Medical History:  Diagnosis Date   Aortic atherosclerosis (HCC) 09/15/2023   Benign localized prostatic hyperplasia with lower urinary tract symptoms (LUTS)    CAD (coronary artery disease) 2001   cardiologist-- dr court;    11/ 2001  acute anferior MI  s/p cath w/ PCI ostial diag & PTCA & BMS to pLAD;   Cath 12/ 2004 stent to mCFx;  cath 02/ 2009 PTCA BMS to dCFx & PTCA/ [place] angio DES to mCFx; cath 07/2013 TO nondominant RCA/ patent LAD& CFx stents;  cath 10/ 2016 DES to mLAD for ISR;  NUC 11-12-2016 LR w/ normal perfusion, nuclear ef 59%   Carpal tunnel syndrome, bilateral    Chronic diastolic CHF (congestive heart failure) (HCC) 07/2013   Chronic rhinitis    Chronic venous stasis    history s/p left GSV ablation yrs ago   CKD (chronic kidney disease), stage III (HCC)    Diverticulosis of colon    Edema of both lower extremities    Environmental and seasonal allergies    History of acute anterior wall MI 2001   History of malignant melanoma 02/2010   previously followed by oncology-- dr verneita (released 2017)  dx 10/ 2011;  02-18-2010  s/p WLE left foot ;   Stage IA,  no SLN bx,  no other treatment and no recurrence   History of non-ST elevation myocardial infarction (NSTEMI) 02/13/2015   History of small bowel obstruction 09/20/2021   s/p  surgical intervention   HTN (hypertension)    Hyperlipidemia, mixed    Hypothyroidism  Incomplete emptying of bladder    Malignant neoplasm prostate [facility]) 08/2023   primary urology--- dr showalter/  radiation oncology--- dr patrcia;   dx 04/ 2025,  Gleason 4+3,  PSA 15.2   OA (osteoarthritis)    knees   OSA (obstructive sleep apnea)    10-15-2023  pt stated does use CPAP since 2023 after losing 140 lb   Psoriasis    S/p bare metal coronary artery stent    11/ 2001  BMS to pLAD;     02/ 2009 BMS to distal dominant CFx   S/P drug eluting coronary stent placement    02/ 2009  DES to mCFx;  DES to mLAD   Type 2  diabetes mellitus (HCC)    followed by pcp   (10-15-2023  pt stated does check blood sugars at home)   Ventral hernia without obstruction or gangrene    followed by general surgeon--- dr l. bridges   Weak urine stream     Past Surgical History:  Procedure Laterality Date   APPENDECTOMY     1950s   CARDIAC CATHETERIZATION N/A 02/14/2015   Procedure: Left Heart Cath and Coronary Angiography;  Surgeon: Candyce GORMAN Reek, MD;  Location: [facility] INVASIVE CV LAB;  Service: Cardiovascular;  Laterality: N/A;   CARDIAC CATHETERIZATION N/A 02/14/2015   Procedure: Coronary Stent Intervention;  Surgeon: Candyce GORMAN Reek, MD;  Location: [facility] INVASIVE CV LAB;  Service: Cardiovascular;  Laterality: N/A;   CATARACT EXTRACTION W/ INTRAOCULAR LENS IMPLANT Bilateral 2007   COLONOSCOPY N/A 10/09/2010   normal rectum, long redundant colon, pancolonic diverticula, 6mm cecal polyp, diminutive polyp at the appendiceal orifice ablated with tip of hot snare (Biopsies suggestive of microscopic colitis; polyp benign; small bowel ok)   CORONARY ANGIOPLASTY WITH STENT PLACEMENT  06/11/2007   @[facility]   by dr ladona;   PTCA & BMS distal CX, PTCA & balloon angioplasty in-stent restenotic & DES to mid CX   CORONARY ANGIOPLASTY WITH STENT PLACEMENT  03/2000   @ [facility]  by dr b. obie;  PCI to ostial diagnal ;  PTCA and BMS to pLAD   CORONARY ANGIOPLASTY WITH STENT PLACEMENT  04/2003   @[facility]  by dr wyn;  stent to midCFx   ESOPHAGOGASTRODUODENOSCOPY N/A 10/09/2010   Normal esophagus, lap band present, 2nd portion duodenum with erosions without frank ulcer   GASTRIC BANDING PORT REVISION N/A 09/20/2021   Procedure: GASTRIC BAND REMOVAL;  Surgeon: Kallie Manuelita BROCKS, MD;  Location: [facility] ORS;  Service: General;  Laterality: N/A;   GOLD SEED IMPLANT N/A 10/20/2023   Procedure: INSERTION, GOLD SEEDS;  Surgeon: Shane Steffan BROCKS, MD;  Location[facility];  Service: Urology;  Laterality: N/A;   KNEE ARTHROSCOPY Bilateral    left 10/ 2004;    right 2006   LAPAROSCOPIC GASTRIC BANDING  2009   LAPAROTOMY N/A 09/20/2021   Procedure: EXPLORATORY LAPAROTOMY,  LYSIS OF ADHESIONS, REDUCTION OF VOLVULUS;  Surgeon: Kallie Manuelita BROCKS, MD;  Location: [facility]S;  Service: General;  Laterality: N/A;   REMOVAL GASTRIC LAP BAND   LEFT HEART CATHETERIZATION WITH CORONARY ANGIOGRAM N/A 07/15/2013   Procedure: LEFT HEART CATHETERIZATION WITH CORONARY ANGIOGRAM;  Surgeon: Victory LELON Claudene DOUGLAS, MD;  Location: [facility] CATH LAB;  Service: Cardiovascular;  Laterality: N/A;   MELANOMA EXCISION Left 02/18/2010   @[facility]  by dr b. sebastian;   WLE MELANOMA OF LEFT FOOT   NASAL SEPTUM SURGERY     1990s   NASAL/SINUS ENDOSCOPY  09/18/2006   @[facility]  by dr d.  shoemaker;   REVISION SEPTOPLASTY/ RIGHT INFERIOR TURBINATE REDUCTION/ LEFT TOTAL ETHMOIDECTOMY   PENILE PROSTHESIS IMPLANT  1996   SHOULDER SURGERY Left 2004   for staph infection   SPACE OAR INSTILLATION N/A 10/20/2023   Procedure: INJECTION, HYDROGEL SPACER;  Surgeon: Shane Steffan BROCKS, MD;  Location: [facility] OR;  Service: Urology;  Laterality: N/A;   TONSILLECTOMY AND ADENOIDECTOMY     child   TYMPANOSTOMY TUBE PLACEMENT Bilateral    1970s    Family History  Problem Relation Age of Onset   Coronary artery disease Mother    Diabetes Father    Liver cancer Brother 81   Celiac disease Other 14       brother's daughter   Allergic rhinitis Neg Hx    Angioedema Neg Hx    Asthma Neg Hx    Atopy Neg Hx    Eczema Neg Hx    Immunodeficiency Neg Hx    Urticaria Neg Hx     Social History   Tobacco Use   Smoking status: Former    Current packs/day: 3.00    Types: Cigarettes    Passive exposure: Past   Smokeless tobacco: Former    Types: Snuff, Chew    Quit date: 100   Tobacco comments:    10-15-2023  pt stated quit smoking 1991,  started age 89 (60)  smoked for 25 yrs  Vaping Use   Vaping status: Never Used  Substance Use Topics   Alcohol use: Not Currently    Comment: 3-4 times/year   Drug use: Never     Medications: I have reviewed the patient's current medications.  Allergies  Allergen Reactions   Irbesartan  Shortness Of Breath and Other (See Comments)   Prednisone  Anaphylaxis, Swelling and Other (See Comments)    Swelling of the lips   Ramipril Shortness Of Breath and Other (See Comments)   Shellfish Allergy  Anaphylaxis    ALL SHELLFISH  Shrimp/  crab   Tradjenta [Linagliptin] Shortness Of Breath   Cephalexin Hives and Swelling   Clindamycin Hcl Hives and Swelling   Codeine Nausea And Vomiting and Other (See Comments)    Affects heart   Contrast Media [Iodinated Contrast Media] Other (See Comments)    Very sick  Can be administered if Benadryl  is administered prior to prevent reaction    Fenofibrate Other (See Comments)    My skin started peeling off   Glycotrol [Support-500] Other (See Comments)    Unknown    Indomethacin Other (See Comments)    Unknown    Minocycline Hcl Hives and Swelling   Morphine And Codeine Nausea And Vomiting    Projectile vomiting   Sulfa Drugs Cross Reactors Hives and Swelling   Tetracyclines & Related Other (See Comments)    When exposed to sun, skin turned bluish     ROS:  Pertinent items are noted in HPI.  Blood pressure (!) 146/63, pulse 72, temperature 97.9 F (36.6 C), temperature source Oral, resp. rate 16, height 5' 9.5 (1.765 m), weight 107.7 kg, SpO2 100%. Physical Exam Vitals reviewed.  Constitutional:      Appearance: He is well-developed.  HENT:     Head: Normocephalic and atraumatic.  Eyes:     Extraocular Movements: Extraocular movements intact.     Pupils: Pupils are equal, round, and reactive to light.  Cardiovascular:     Rate and Rhythm: Normal rate.  Pulmonary:     Effort: Pulmonary effort is normal.  Abdominal:     Comments: Abdomen  soft, nondistended, no percussion tenderness, minimal tenderness to palpation; no rigidity, guarding, rebound tenderness; midline hernia soft and reducible  Skin:     General: Skin is warm and dry.  Neurological:     General: No focal deficit present.     Mental Status: He is alert and oriented to person, place, and time.  Psychiatric:        Mood and Affect: Mood normal.        Behavior: Behavior normal.     Results: Results for orders placed or performed during the hospital encounter of 12/28/23 (from the past 48 hours)  Lipase, blood     Status: None   Collection Time: 12/28/23  7:55 AM  Result Value Ref Range   Lipase 27 11 - 51 U/L    Comment: Performed at [facility], [address], [place], [place] [address]  Comprehensive metabolic panel     Status: Abnormal   Collection Time: 12/28/23  7:55 AM  Result Value Ref Range   Sodium 130 (L) 135 - 145 mmol/L   Potassium 4.4 3.5 - 5.1 mmol/L   Chloride 94 (L) 98 - 111 mmol/L   CO2 24 22 - 32 mmol/L   Glucose, Bld 148 (H) 70 - 99 mg/dL    Comment: Glucose reference range applies only to samples taken after fasting for at least 8 hours.   BUN 29 (H) 8 - 23 mg/dL   Creatinine, Ser 8.32 (H) 0.61 - 1.24 mg/dL   Calcium 9.8 8.9 - 89.6 mg/dL   Total Protein 6.8 6.5 - 8.1 g/dL   Albumin  3.9 3.5 - 5.0 g/dL   AST 13 (L) 15 - 41 U/L   ALT 12 0 - 44 U/L   Alkaline Phosphatase 46 38 - 126 U/L   Total Bilirubin 1.1 0.0 - 1.2 mg/dL   GFR, Estimated 42 (L) >60 mL/min    Comment: (NOTE) Calculated using the CKD-EPI Creatinine Equation (2021)    Anion gap 12 5 - 15    Comment: Performed at [facility], [address], [place], [place] [address]  CBC     Status: Abnormal   Collection Time: 12/28/23  7:55 AM  Result Value Ref Range   WBC 5.9 4.0 - 10.5 K/uL   RBC 3.05 (L) 4.22 - 5.81 MIL/uL   Hemoglobin 9.9 (L) 13.0 - 17.0 g/dL   HCT 70.1 (L) 60.9 - 47.9 %   MCV 97.7 80.0 - 100.0 fL   MCH 32.5 26.0 - 34.0 pg   MCHC 33.2 30.0 - 36.0 g/dL   RDW 86.8 88.4 - 84.4 %   Platelets 121 (L) 150 - 400 K/uL   nRBC 0.0 0.0 - 0.2 %    Comment: Performed at [facility], [address], [place], [place]  [address]  Urinalysis, Routine w reflex microscopic -Urine, Clean Catch     Status: Abnormal   Collection Time: 12/28/23  9:22 AM  Result Value Ref Range   Color, Urine STRAW (A) YELLOW   APPearance CLEAR CLEAR   Specific Gravity, Urine 1.010 1.005 - 1.030   pH 7.0 5.0 - 8.0   Glucose, UA NEGATIVE NEGATIVE mg/dL   Hgb urine dipstick NEGATIVE NEGATIVE   Bilirubin Urine NEGATIVE NEGATIVE   Ketones, ur NEGATIVE NEGATIVE mg/dL   Protein, ur NEGATIVE NEGATIVE mg/dL   Nitrite NEGATIVE NEGATIVE   Leukocytes,Ua NEGATIVE NEGATIVE    Comment: Performed at [facility], [address], [place], [place] [address]  Lactic acid, plasma  Status: None   Collection Time: 12/28/23 11:11 AM  Result Value Ref Range   Lactic Acid, Venous 1.0 0.5 - 1.9 mmol/L    Comment: Performed at [facility], [address], [place], [place] [address]  Lactic acid, plasma     Status: None   Collection Time: 12/28/23 12:39 PM  Result Value Ref Range   Lactic Acid, Venous 1.2 0.5 - 1.9 mmol/L    Comment: Performed at [facility], [address] [place], [place] [address]  Glucose, capillary     Status: Abnormal   Collection Time: 12/28/23  2:07 PM  Result Value Ref Range   Glucose-Capillary 182 (H) 70 - 99 mg/dL    Comment: Glucose reference range applies only to samples taken after fasting for at least 8 hours.    DG Abd Portable 1V Result Date: 12/28/2023 CLINICAL DATA:  Nasogastric tube placement. EXAM: PORTABLE ABDOMEN - 1 VIEW COMPARISON:  CT earlier today FINDINGS: Tip and side port of the enteric tube below the diaphragm in the stomach. Bowel dilatation on CT is not included in the field of view. IMPRESSION: Tip and side port of the enteric tube below the diaphragm in the stomach. Electronically Signed   By: Andrea Gasman M.D.   On: 12/28/2023 12:40   CT ABDOMEN PELVIS WO CONTRAST Result Date: 12/28/2023 CLINICAL DATA:  Abdominal pain, acute, nonlocalized. EXAM: CT ABDOMEN AND PELVIS WITHOUT CONTRAST  TECHNIQUE: Multidetector CT imaging of the abdomen and pelvis was performed following the standard protocol without IV contrast. RADIATION DOSE REDUCTION: This exam was performed according to the departmental dose-optimization program which includes automated exposure control, adjustment of the mA and/or kV according to patient size and/or use of iterative reconstruction technique. COMPARISON:  CT 11/03/2023 FINDINGS: Lower chest: Lung bases are clear. Small amount of pericardial fluid. Hepatobiliary: Again noted is trace perihepatic ascites. Cholelithiasis. No significant gallbladder distension. No biliary dilatation. Pancreas: Unremarkable. No pancreatic ductal dilatation or surrounding inflammatory changes. Spleen: Again noted is a small peripherally calcified low-density structure in the medial spleen on image 18/2. Suspect this is an incidental finding. Spleen size is normal. Adrenals/Urinary Tract: Normal adrenal glands. Normal appearance of both kidneys without hydronephrosis. Negative for kidney stones. No suspicious renal lesions. Normal appearance of the urinary bladder. Stomach/Bowel: Dilated loops small bowel with some adjacent mesenteric edema. There is a small bowel transition point on image 47/2. Etiology could represent adhesions versus internal hernia. Small pockets of peripheral gas in small bowel loops in the mid lower abdomen with adjacent mesenteric edema. Findings raise concern for possible pneumatosis in these areas. Scattered colonic diverticula without colonic inflammation. Small bowel loops associated with midline ventral hernia. No significant gastric distension. Proximal small bowel is decompressed. Vascular/Lymphatic: Aortic atherosclerosis. Mildly prominent left external iliac lymph node on image 78/2 measures 1.2 cm in the short axis and minimal change from the recent comparison examination. Previously this lymph node was PET avid. Reproductive: Enlarged prostate with fiducial markers.  Penile prosthesis implants. Other: Trace fluid in the pelvis. Trace perihepatic ascites. Large ventral hernia containing loops of dilated small bowel. Negative for pneumoperitoneum. Musculoskeletal: Disc space narrowing at L2-L3. No acute bone abnormality. IMPRESSION: 1. Dilated small bowel loops compatible with a small bowel obstruction. There is a small amount of mesenteric edema adjacent to dilated small bowel loops and cannot exclude developing pneumatosis. Transition point in small bowel is identified in the mid abdomen. There are decompressed small bowel loops proximal and distal to the dilated loops of small  bowel which raises concern for an internal hernia as the etiology for the small bowel obstruction. 2. Large ventral hernia containing loops of dilated small bowel but the ventral hernia does not appear to be the cause for the small bowel obstruction. 3. Small amount of abdominopelvic ascites. 4.  Aortic Atherosclerosis (ICD10-I70.0). 5. Cholelithiasis. 6. Mildly prominent left external iliac lymph node has minimally changed. Critical Value/emergent results were called by telephone at the time of interpretation on 12/28/2023 at 10:27 am to provider [address] [facility] ZAMMIT , who verbally acknowledged these results. Electronically Signed   By: Juliene Balder M.D.   On: 12/28/2023 11:29     Assessment & Plan:  LAVONTAE CORNIA is a 78 y.o. male who was admitted to the hospital with concern for small bowel obstruction.  Imaging and blood work evaluated by myself  -Discussed the imaging findings with the patient.  We discussed that given there is concern for possible internal hernia/closed-loop obstruction as the source of his symptoms, the most appropriate next course of action would be to proceed to the operating room for surgical intervention -We further discussed that he is at high risk for bleeding secondary to his Plavix  usage.  I further explained that he may require blood transfusion and/or platelets in the  postoperative period -The risk and benefits of exploratory laparotomy, lysis of adhesions, possible small bowel resection were discussed including but not limited to bleeding, infection, injury to surrounding structures, need for additional procedures, recurrent hernia, and cardiac complication.  After careful consideration, Kevin [name] has decided to proceed with surgery.  -Will plan to proceed with surgery today -NPO -NG to LIS -PRN pain control and antiemetics -Prophylactic ciprofloxacin  and Flagyl  ordered -Further recommendations to follow surgery -Appreciate hospitalist recommendations for other medical comorbidities  All questions were answered to the satisfaction of the patient.  Note: Portions of this report may have been transcribed using voice recognition software. Every effort has been made to ensure accuracy; however, inadvertent computerized transcription errors may still be present.   -- Dorothyann Brittle, DO [facility] [address] [place], [place] [address] [phone] (office)

## 2023-12-29 ENCOUNTER — Encounter (HOSPITAL_COMMUNITY): Payer: Self-pay | Admitting: Surgery

## 2023-12-29 DIAGNOSIS — N1831 Chronic kidney disease, stage 3a: Secondary | ICD-10-CM | POA: Diagnosis not present

## 2023-12-29 DIAGNOSIS — K439 Ventral hernia without obstruction or gangrene: Secondary | ICD-10-CM

## 2023-12-29 DIAGNOSIS — E669 Obesity, unspecified: Secondary | ICD-10-CM | POA: Diagnosis not present

## 2023-12-29 DIAGNOSIS — K660 Peritoneal adhesions (postprocedural) (postinfection): Secondary | ICD-10-CM

## 2023-12-29 DIAGNOSIS — K56609 Unspecified intestinal obstruction, unspecified as to partial versus complete obstruction: Secondary | ICD-10-CM | POA: Diagnosis not present

## 2023-12-29 LAB — BASIC METABOLIC PANEL WITH GFR
Anion gap: 9 (ref 5–15)
BUN: 26 mg/dL — ABNORMAL HIGH (ref 8–23)
CO2: 23 mmol/L (ref 22–32)
Calcium: 8.8 mg/dL — ABNORMAL LOW (ref 8.9–10.3)
Chloride: 99 mmol/L (ref 98–111)
Creatinine, Ser: 1.44 mg/dL — ABNORMAL HIGH (ref 0.61–1.24)
GFR, Estimated: 50 mL/min — ABNORMAL LOW (ref >60)
Glucose, Bld: 130 mg/dL — ABNORMAL HIGH (ref 70–99)
Potassium: 4.4 mmol/L (ref 3.5–5.1)
Sodium: 131 mmol/L — ABNORMAL LOW (ref 135–145)

## 2023-12-29 LAB — CBC
HCT: 29.1 % — ABNORMAL LOW (ref 39.0–52.0)
Hemoglobin: 9.6 g/dL — ABNORMAL LOW (ref 13.0–17.0)
MCH: 32.4 pg (ref 26.0–34.0)
MCHC: 33 g/dL (ref 30.0–36.0)
MCV: 98.3 fL (ref 80.0–100.0)
Platelets: 130 K/uL — ABNORMAL LOW (ref 150–400)
RBC: 2.96 MIL/uL — ABNORMAL LOW (ref 4.22–5.81)
RDW: 13.2 % (ref 11.5–15.5)
WBC: 4.2 K/uL (ref 4.0–10.5)
nRBC: 0 % (ref 0.0–0.2)

## 2023-12-29 LAB — GLUCOSE, CAPILLARY
Glucose-Capillary: 143 mg/dL — ABNORMAL HIGH (ref 70–99)
Glucose-Capillary: 120 mg/dL — ABNORMAL HIGH (ref 70–99)
Glucose-Capillary: 135 mg/dL — ABNORMAL HIGH (ref 70–99)
Glucose-Capillary: 132 mg/dL — ABNORMAL HIGH (ref 70–99)
Glucose-Capillary: 126 mg/dL — ABNORMAL HIGH (ref 70–99)

## 2023-12-29 MED ORDER — PHENOL 1.4 % MT LIQD
2.0000 | OROMUCOSAL | Status: DC | PRN
  Administered 2023-12-29: 2 via OROMUCOSAL
  Filled 2023-12-29: qty 177

## 2023-12-29 MED ORDER — CHLORHEXIDINE GLUCONATE CLOTH 2 % EX PADS
6.0000 | MEDICATED_PAD | Freq: Every day | CUTANEOUS | Status: DC
  Administered 2023-12-29: 6 via TOPICAL

## 2023-12-29 MED ORDER — BISACODYL 10 MG RE SUPP
10.0000 mg | Freq: Every day | RECTAL | Status: DC
  Administered 2023-12-29 – 2024-01-02 (×5): 10 mg via RECTAL
  Filled 2023-12-29 (×6): qty 1

## 2023-12-29 MED ORDER — LACTATED RINGERS IV SOLN: INTRAVENOUS | Status: DC

## 2023-12-29 NOTE — Hospital Course (Signed)
 78 year old male with a history of coronary artery disease status post LAD and circumflex stenting 2001 and 2004, 02/14/2015 revealed diffuse LAD in-stent restenosis which was restented, diabetes mellitus type 2, hypertension, hyperlipidemia, prostate cancer, hypothyroidism, SBO from volvulus presenting (2023) with 1 day history of abdominal pain with nausea and vomiting.  The patient states that he had been off of his Mounjaro for about 2 months because of his prostate cancer treatments.  He restarted it on 12/26/2023.  On the morning of 12/27/2023, the patient began developing abdominal pain.  This worsened and he began developing nausea and vomiting and dry heaving.  As result, he presented for further evaluation and treatment.  He denies any other new medications. He denies any fevers, chills, chest pain, cough, hemoptysis, hematochezia, melena, dysuria, hematuria.   In the ED, the patient was afebrile hemodynamically stable with oxygen saturation 1 9% room air.  WBC 5.9, hemoglobin 9.9, platelets 121.  Sodium 130, potassium 4.4, bicarbonate 24, serum creatinine 1.67.  LFTs unremarkable.  CT abdomen pelvis showed mesenteric edema adjacent to dilated small bowel loops.  There was a transition point in the small bowel in the midabdomen.  There is a large ventral hernia with dilated small bowel loops, but the ventral hernia does not appear to be cause of SBO.  General surgery was consulted to assist with management.  NG was placed for decompression. Dr. Evonnie took him to surgery for Ex lap with LOA and primary repair of ventral hernia on 12/28/23.

## 2023-12-29 NOTE — TOC Initial Note (Signed)
 Transition of Care [facility]) - Initial/Assessment Note    Patient Details  Name: Kevin [name] MRN: [id number] Date of Birth: 1945/09/19  Transition of Care [facility]) CM/SW Contact:    Lucie Lunger, LCSWA Phone Number: 12/29/2023, 9:05 AM  Clinical Narrative:                 CSW notes per chart review that pt is high risk for readmission. CSW also notes pt arrived to hospital from [facility]. CSW spoke with pts niece Rosaline who is first contact to complete assessment. Pt is a resident at [facility] and has been there for over a year per niece. Pt is typically independent with ADLs at baseline and does not use any DME. Niece mentioned concern for pts functioning after hospital stay and if there would be additional care needed. CSW requested MD place PT/OT orders for eval. CSW explained to [place] that based on recommendations from PT/OT CSW would be able to assist with setting up services. TOC to follow.   Expected Discharge Plan: Home w Home Health Services Barriers to Discharge: Continued Medical Work up   Patient Goals and CMS Choice Patient states their goals for this hospitalization and ongoing recovery are:: get better CMS Medicare.gov Compare Post Acute Care list provided to:: Patient Represenative (must comment) Choice offered to / list presented to : Adult Children      Expected Discharge Plan and Services In-house Referral: Clinical Social Work Discharge Planning Services: CM Consult   Living arrangements for the past 2 months: Independent Living Facility                                      Prior Living Arrangements/Services Living arrangements for the past 2 months: Independent Living Facility Lives with:: Facility Resident Patient language and need for interpreter reviewed:: Yes Do you feel safe going back to the place where you live?: Yes      Need for Family Participation in Patient Care: Yes (Comment) Care giver support system in  place?: Yes (comment)   Criminal Activity/Legal Involvement Pertinent to Current Situation/Hospitalization: No - Comment as needed  Activities of Daily Living   ADL Screening (condition at time of admission) Independently performs ADLs?: Yes (appropriate for developmental age) Is the patient deaf or have difficulty hearing?: No Does the patient have difficulty seeing, even when wearing glasses/contacts?: No Does the patient have difficulty concentrating, remembering, or making decisions?: No  Permission Sought/Granted                  Emotional Assessment Appearance:: Appears stated age       Alcohol / Substance Use: Not Applicable Psych Involvement: No (comment)  Admission diagnosis:  Small bowel obstruction (HCC) [K56.609] Patient Active Problem List   Diagnosis Date Noted   Chronic kidney disease, stage 3a (HCC) 12/28/2023   Low back pain 11/04/2023   Thrombocytopenia (HCC) 11/04/2023   BPH (benign prostatic hyperplasia) 11/04/2023   Acquired hypothyroidism 11/04/2023   Hyponatremia 11/03/2023   Aortic atherosclerosis (HCC) 09/15/2023   Malignant neoplasm of prostate (HCC) 09/15/2023   AKI (acute kidney injury) (HCC) 06/30/2023   Enteritis due to Norovirus 06/30/2023   Diastasis recti 02/13/2022   Volvulus of jejunum (HCC)    Personal history of gastric banding    Gouty arthritis of both feet 09/18/2021   Obesity (BMI 30-39.9) 09/11/2021   Acute renal failure superimposed on  stage 3a chronic kidney disease (HCC) - baseline SCr (1.2-1.4) 09/11/2021   Type 2 diabetes mellitus with hyperglycemia (HCC) 09/11/2021   Small bowel obstruction (HCC) 09/10/2021   Carpal tunnel syndrome of left wrist 10/12/2020   Carpal tunnel syndrome of right wrist 10/11/2020   Bilateral shoulder pain 06/20/2020   Anaphylactic shock due to adverse food reaction 08/22/2019   Rhinitis, chronic 02/19/2018   Bilateral hearing loss 12/25/2017   Eustachian tube dysfunction 12/25/2017    Right chronic serous otitis media 12/25/2017   Dyslipidemia 02/22/2015   Acute coronary syndrome (HCC) 02/14/2015   NSTEMI (non-ST elevated myocardial infarction) (HCC)    Chest pain 02/13/2015   OSA on CPAP 01/20/2014   Accelerating angina (HCC) 07/14/2013   Edema, lower extremity, bil 07/14/2013   Coronary artery disease, hx LAD and LCX stenting in 2001 & 2004, last cath 2009 with patent stents. 04/07/2013   Essential hypertension 04/07/2013   Mixed hyperlipidemia 04/07/2013   Diabetes (HCC) 04/07/2013   Morbid obesity (HCC) 04/07/2013   Melanoma of foot (HCC) 06/02/2011   Microscopic colitis 10/24/2010   GI bleed 10/01/2010   Acute infectious diarrhea - due to norovirus 10/01/2010   PCP:  Marvine Rush, MD Pharmacy:   [facility] - [place], [place] - [address] [place] [address] Phone: [phone] Fax: [phone]     Social Drivers of Health (SDOH) Social History: SDOH Screenings   Food Insecurity: No Food Insecurity (12/28/2023)  Housing: Low Risk  (12/28/2023)  Transportation Needs: No Transportation Needs (12/28/2023)  Utilities: Not At Risk (12/28/2023)  Alcohol Screen: Low Risk  (09/15/2023)  Depression (PHQ2-9): Low Risk  (09/15/2023)  Financial Resource Strain: Low Risk  (08/05/2022)  Physical Activity: Inactive (08/05/2022)  Social Connections: Socially Isolated (12/28/2023)  Stress: No Stress Concern Present (08/05/2022)  Tobacco Use: Medium Risk (12/28/2023)   SDOH Interventions:     Readmission Risk Interventions    12/29/2023    9:04 AM 11/04/2023    8:21 AM 09/17/2021   11:27 AM  Readmission Risk Prevention Plan  Transportation Screening Complete Complete Complete  Home Care Screening   Complete  Medication Review (RN CM)   Complete  HRI or Home Care Consult Complete Complete   Social Work Consult for Recovery Care Planning/Counseling Complete Complete   Palliative Care Screening Not Applicable Not Applicable    Medication Review Oceanographer) Complete Complete

## 2023-12-29 NOTE — Anesthesia Postprocedure Evaluation (Signed)
 Anesthesia Post Note  Patient: Kevin [name]  Procedure(s) Performed: LAPAROTOMY, FOR LYSIS OF ADHESIONS (Abdomen) PRIMARY REPAIR, HERNIA, VENTRAL (Abdomen)  Patient location during evaluation: Phase II Anesthesia Type: General Level of consciousness: awake Pain management: pain level controlled Vital Signs Assessment: post-procedure vital signs reviewed and stable Respiratory status: spontaneous breathing and respiratory function stable Cardiovascular status: blood pressure returned to baseline and stable Postop Assessment: no headache and no apparent nausea or vomiting Anesthetic complications: no Comments: Late entry   No notable events documented.   Last Vitals:  Vitals:   12/29/23 0253 12/29/23 0359  BP: 121/64 (!) 98/58  Pulse: 96 83  Resp: 18 18  Temp:  37.1 C  SpO2: 95% 95%    Last Pain:  Vitals:   12/29/23 0805  TempSrc:   PainSc: 7                  Yvonna JINNY Bosworth

## 2023-12-29 NOTE — Plan of Care (Signed)
  Problem: Acute Rehab PT Goals(only PT should resolve) Goal: Pt Will Go Supine/Side To Sit Outcome: Progressing Flowsheets (Taken 12/29/2023 1146) Pt will go Supine/Side to Sit: Independently Goal: Patient Will Transfer Sit To/From Stand Outcome: Progressing Flowsheets (Taken 12/29/2023 1146) Patient will transfer sit to/from stand: with modified independence Goal: Pt Will Transfer Bed To Chair/Chair To Bed Outcome: Progressing Flowsheets (Taken 12/29/2023 1146) Pt will Transfer Bed to Chair/Chair to Bed: with modified independence Goal: Pt Will Ambulate Outcome: Progressing Flowsheets (Taken 12/29/2023 1146) Pt will Ambulate:  50 feet  with least restrictive assistive device  with supervision   11:47 AM, 12/29/23 Rosaria Settler, PT, DPT [place] with [facility]

## 2023-12-29 NOTE — Progress Notes (Signed)
 [facility] Progress Note  1 Day Post-Op  Subjective: Patient seen and examined.  He is resting comfortably in chair at the side of the bed.  He has had minimal NG tube output and denies flatus or bowel movement since the surgery.  He complains of abdominal pain mostly at his midline incision site.  Objective: Vital signs in last 24 hours: Temp:  [97.6 F (36.4 C)-98.8 F (37.1 C)] 98.8 F (37.1 C) (08/26 0359) Pulse Rate:  [71-96] 83 (08/26 0359) Resp:  [12-20] 18 (08/26 0359) BP: (98-149)/(51-77) 98/58 (08/26 0359) SpO2:  [95 %-100 %] 95 % (08/26 0359) Weight:  [104.3 kg-107.7 kg] 104.3 kg (08/25 1531) Last BM Date : 12/25/23  Intake/Output from previous day: 08/25 0701 - 08/26 0700 In: 2946.1 [I.V.:2496.1; NG/GT:50; IV Piggyback:400] Out: 1170 [Urine:870; Blood:100] Intake/Output this shift: No intake/output data recorded.  General appearance: alert, cooperative, and no distress GI: Abdomen soft, nondistended, no percussion tenderness, incisional tenderness to palpation; no rigidity, guarding, rebound tenderness; midline incision C/D/I with honeycomb dressing in place, small amount of dried blood present  Lab Results:  Recent Labs    12/28/23 0755 12/29/23 0444  WBC 5.9 4.2  HGB 9.9* 9.6*  HCT 29.8* 29.1*  PLT 121* 130*   BMET Recent Labs    12/28/23 0755 12/29/23 0444  NA 130* 131*  K 4.4 4.4  CL 94* 99  CO2 24 23  GLUCOSE 148* 130*  BUN 29* 26*  CREATININE 1.67* 1.44*  CALCIUM 9.8 8.8*   PT/INR No results for input(s): LABPROT, INR in the last 72 hours.  Studies/Results: DG Abd Portable 1V Result Date: 12/28/2023 CLINICAL DATA:  Nasogastric tube placement. EXAM: PORTABLE ABDOMEN - 1 VIEW COMPARISON:  CT earlier today FINDINGS: Tip and side port of the enteric tube below the diaphragm in the stomach. Bowel dilatation on CT is not included in the field of view. IMPRESSION: Tip and side port of the enteric tube below the diaphragm in  the stomach. Electronically Signed   By: Andrea Gasman M.D.   On: 12/28/2023 12:40   CT ABDOMEN PELVIS WO CONTRAST Result Date: 12/28/2023 CLINICAL DATA:  Abdominal pain, acute, nonlocalized. EXAM: CT ABDOMEN AND PELVIS WITHOUT CONTRAST TECHNIQUE: Multidetector CT imaging of the abdomen and pelvis was performed following the standard protocol without IV contrast. RADIATION DOSE REDUCTION: This exam was performed according to the departmental dose-optimization program which includes automated exposure control, adjustment of the mA and/or kV according to patient size and/or use of iterative reconstruction technique. COMPARISON:  CT 11/03/2023 FINDINGS: Lower chest: Lung bases are clear. Small amount of pericardial fluid. Hepatobiliary: Again noted is trace perihepatic ascites. Cholelithiasis. No significant gallbladder distension. No biliary dilatation. Pancreas: Unremarkable. No pancreatic ductal dilatation or surrounding inflammatory changes. Spleen: Again noted is a small peripherally calcified low-density structure in the medial spleen on image 18/2. Suspect this is an incidental finding. Spleen size is normal. Adrenals/Urinary Tract: Normal adrenal glands. Normal appearance of both kidneys without hydronephrosis. Negative for kidney stones. No suspicious renal lesions. Normal appearance of the urinary bladder. Stomach/Bowel: Dilated loops small bowel with some adjacent mesenteric edema. There is a small bowel transition point on image 47/2. Etiology could represent adhesions versus internal hernia. Small pockets of peripheral gas in small bowel loops in the mid lower abdomen with adjacent mesenteric edema. Findings raise concern for possible pneumatosis in these areas. Scattered colonic diverticula without colonic inflammation. Small bowel loops associated with midline ventral hernia. No significant gastric distension. Proximal small  bowel is decompressed. Vascular/Lymphatic: Aortic atherosclerosis. Mildly  prominent left external iliac lymph node on image 78/2 measures 1.2 cm in the short axis and minimal change from the recent comparison examination. Previously this lymph node was PET avid. Reproductive: Enlarged prostate with fiducial markers. Penile prosthesis implants. Other: Trace fluid in the pelvis. Trace perihepatic ascites. Large ventral hernia containing loops of dilated small bowel. Negative for pneumoperitoneum. Musculoskeletal: Disc space narrowing at L2-L3. No acute bone abnormality. IMPRESSION: 1. Dilated small bowel loops compatible with a small bowel obstruction. There is a small amount of mesenteric edema adjacent to dilated small bowel loops and cannot exclude developing pneumatosis. Transition point in small bowel is identified in the mid abdomen. There are decompressed small bowel loops proximal and distal to the dilated loops of small bowel which raises concern for an internal hernia as the etiology for the small bowel obstruction. 2. Large ventral hernia containing loops of dilated small bowel but the ventral hernia does not appear to be the cause for the small bowel obstruction. 3. Small amount of abdominopelvic ascites. 4.  Aortic Atherosclerosis (ICD10-I70.0). 5. Cholelithiasis. 6. Mildly prominent left external iliac lymph node has minimally changed. Critical Value/emergent results were called by telephone at the time of interpretation on 12/28/2023 at 10:27 am to provider [facility] ZAMMIT , who verbally acknowledged these results. Electronically Signed   By: Juliene Balder M.D.   On: 12/28/2023 11:29    Anti-infectives: Anti-infectives (From admission, onward)    Start     Dose/Rate Route Frequency Ordered Stop   12/28/23 1530  ciprofloxacin  (CIPRO ) IVPB 400 mg        400 mg 200 mL/hr over 60 Minutes Intravenous On call to O.R. 12/28/23 1518 12/28/23 1619   12/28/23 1530  metroNIDAZOLE  (FLAGYL ) IVPB 500 mg        500 mg 100 mL/hr over 60 Minutes Intravenous On call to O.R. 12/28/23 1518  12/28/23 1640       Assessment/Plan:  Patient is a 78 year old male who was admitted with small bowel obstruction and concern for possible closed-loop pathology.  He is status post exploratory laparotomy, lysis of adhesions, and primary repair of ventral hernia on 8/25.  -Patient without leukocytosis today and hemoglobin stable.  Will continue to monitor for signs of bleeding, especially given Plavix  being on board at the time of surgery -NPO -NG to LIS until patient has flatus -Will plan to remove NG tube once patient begins passing flatus -Monitor for return of bowel function -Will order Dulcolax suppository -IVF -Foley able to be removed from surgical standpoint -PRN pain control and antiemetics -Appreciate hospitalist recommendations   LOS: 1 day    Umair Rosiles A Jorgen Wolfinger 12/29/2023  Note: Portions of this report may have been transcribed using voice recognition software. Every effort has been made to ensure accuracy; however, inadvertent computerized transcription errors may still be present.

## 2023-12-29 NOTE — Progress Notes (Addendum)
 PROGRESS NOTE  Kevin [name] FMW:[id number] DOB: 1945/10/07 DOA: 12/28/2023 PCP: Marvine Rush, MD  Brief History:  78 year old male with a history of coronary artery disease status post LAD and circumflex stenting 2001 and 2004, 02/14/2015 revealed diffuse LAD in-stent restenosis which was restented, diabetes mellitus type 2, hypertension, hyperlipidemia, prostate cancer, hypothyroidism, SBO from volvulus presenting (2023) with 1 day history of abdominal pain with nausea and vomiting.  The patient states that he had been off of his Mounjaro for about 2 months because of his prostate cancer treatments.  He restarted it on 12/26/2023.  On the morning of 12/27/2023, the patient began developing abdominal pain.  This worsened and he began developing nausea and vomiting and dry heaving.  As result, he presented for further evaluation and treatment.  He denies any other new medications. He denies any fevers, chills, chest pain, cough, hemoptysis, hematochezia, melena, dysuria, hematuria.   In the ED, the patient was afebrile hemodynamically stable with oxygen saturation 1 9% room air.  WBC 5.9, hemoglobin 9.9, platelets 121.  Sodium 130, potassium 4.4, bicarbonate 24, serum creatinine 1.67.  LFTs unremarkable.  CT abdomen pelvis showed mesenteric edema adjacent to dilated small bowel loops.  There was a transition point in the small bowel in the midabdomen.  There is a large ventral hernia with dilated small bowel loops, but the ventral hernia does not appear to be cause of SBO.  General surgery was consulted to assist with management.  NG was placed for decompression. Dr. Evonnie took him to surgery for Ex lap with LOA and primary repair of ventral hernia on 12/28/23.       Assessment/Plan:  Small bowel obstruction -General Surgery consult appreciated - 12/28/23--Ex lap with LOA and primary repair of ventral hernia  - maintain NG until pt has flatus - continue IV fluids - Judicious  opioids - remove foley   Essential hypertension - Holding metoprolol  due to soft BPs - Holding ARB   CKD stage IIIa - Baseline creatinine 1.2-1.4 - Monitor BMP   Coronary artery disease - History of LAD and Lcx stents 2001 and 2004 - Restented LAD 2016 - No chest pain presently - personally reviewed EKG--sinus, no STT changes - Holding aspirin  and Plavix  temporarily>>restart when ok with general surgery   Mixed hyperlipidemia -Holding statin until able to tolerate p.o.   Diabetes mellitus type 2 with hyperglycemia - NovoLog  sliding scale - 06/30/2023 hemoglobin A1c--6.4 - NovoLog  scale   Prostate cancer - Patient finished radiation approximately 2 weeks prior to admission   Obesity - BMI 33.48 - Lifestyle modification   Hypothyroidism - Restart Synthroid  once able to tolerate p.o.           Family Communication:   no Family at bedside  Consultants:  general surgery  Code Status:  FULL   DVT Prophylaxis:  [place] Heparin     Procedures: As Listed in Progress Note Above  Antibiotics: Periop cipro  and flagyl      Subjective: Patient complains of some incisional pain in his abdomen.  He denies any fever, chills, chest pain, shortness breath, coughing, nausea, vomiting.  There is no flatus or bowel movement yet.  Objective: Vitals:   12/28/23 2305 12/28/23 2343 12/29/23 0253 12/29/23 0359  BP: 112/65 112/60 121/64 (!) 98/58  Pulse: 91 95 96 83  Resp: 18 18 18 18   Temp: 97.7 F (36.5 C)   98.8 F (37.1 C)  TempSrc: Oral   Axillary  SpO2: 97% 95% 95% 95%  Weight:      Height:        Intake/Output Summary (Last 24 hours) at 12/29/2023 1531 Last data filed at 12/29/2023 1233 Gross per 24 hour  Intake 2946.05 ml  Output 1445 ml  Net 1501.05 ml   Weight change:  Exam:  General:  Pt is alert, follows commands appropriately, not in acute distress HEENT: No icterus, No thrush, No neck mass, [place]/AT Cardiovascular: RRR, S1/S2, no rubs, no  gallops Respiratory: Bibasilar rales but no wheezing Abdomen: Soft/+BS, mild diffuse tender, non distended, no guarding Extremities: No edema, No lymphangitis, No petechiae, No rashes, no synovitis   Data Reviewed: I have personally reviewed following labs and imaging studies Basic Metabolic Panel: Recent Labs  Lab 12/28/23 0755 12/29/23 0444  NA 130* 131*  K 4.4 4.4  CL 94* 99  CO2 24 23  GLUCOSE 148* 130*  BUN 29* 26*  CREATININE 1.67* 1.44*  CALCIUM 9.8 8.8*   Liver Function Tests: Recent Labs  Lab 12/28/23 0755  AST 13*  ALT 12  ALKPHOS 46  BILITOT 1.1  PROT 6.8  ALBUMIN  3.9   Recent Labs  Lab 12/28/23 0755  LIPASE 27   No results for input(s): AMMONIA in the last 168 hours. Coagulation Profile: No results for input(s): INR, PROTIME in the last 168 hours. CBC: Recent Labs  Lab 12/28/23 0755 12/29/23 0444  WBC 5.9 4.2  HGB 9.9* 9.6*  HCT 29.8* 29.1*  MCV 97.7 98.3  PLT 121* 130*   Cardiac Enzymes: No results for input(s): CKTOTAL, CKMB, CKMBINDEX, TROPONINI in the last 168 hours. BNP: Invalid input(s): POCBNP CBG: Recent Labs  Lab 12/28/23 1946 12/28/23 2212 12/29/23 0238 12/29/23 0541 12/29/23 1126  GLUCAP 137* 137* 143* 120* 135*   HbA1C: Recent Labs    12/28/23 1433  HGBA1C 6.1*   Urine analysis:    Component Value Date/Time   COLORURINE STRAW (A) 12/28/2023 0922   APPEARANCEUR CLEAR 12/28/2023 0922   LABSPEC 1.010 12/28/2023 0922   PHURINE 7.0 12/28/2023 0922   GLUCOSEU NEGATIVE 12/28/2023 0922   HGBUR NEGATIVE 12/28/2023 0922   BILIRUBINUR NEGATIVE 12/28/2023 0922   KETONESUR NEGATIVE 12/28/2023 0922   PROTEINUR NEGATIVE 12/28/2023 0922   UROBILINOGEN 0.2 10/01/2010 1544   NITRITE NEGATIVE 12/28/2023 0922   LEUKOCYTESUR NEGATIVE 12/28/2023 0922   Sepsis Labs: @LABRCNTIP (procalcitonin:4,lacticidven:4) )No results found for this or any previous visit (from the past 240 hours).   Scheduled Meds:   acetaminophen   1,000 mg Per Tube Q6H   bisacodyl   10 mg Rectal Daily   Chlorhexidine  Gluconate Cloth  6 each Topical Q0600   heparin   5,000 Units Subcutaneous Q8H   insulin  aspart  0-9 Units Subcutaneous Q4H   Continuous Infusions:    Procedures/Studies: DG Abd Portable 1V Result Date: 12/28/2023 CLINICAL DATA:  Nasogastric tube placement. EXAM: PORTABLE ABDOMEN - 1 VIEW COMPARISON:  CT earlier today FINDINGS: Tip and side port of the enteric tube below the diaphragm in the stomach. Bowel dilatation on CT is not included in the field of view. IMPRESSION: Tip and side port of the enteric tube below the diaphragm in the stomach. Electronically Signed   By: Andrea Gasman M.D.   On: 12/28/2023 12:40   CT ABDOMEN PELVIS WO CONTRAST Result Date: 12/28/2023 CLINICAL DATA:  Abdominal pain, acute, nonlocalized. EXAM: CT ABDOMEN AND PELVIS WITHOUT CONTRAST TECHNIQUE: Multidetector CT imaging of the abdomen and pelvis was performed following the standard protocol without IV contrast. RADIATION DOSE  REDUCTION: This exam was performed according to the departmental dose-optimization program which includes automated exposure control, adjustment of the mA and/or kV according to patient size and/or use of iterative reconstruction technique. COMPARISON:  CT 11/03/2023 FINDINGS: Lower chest: Lung bases are clear. Small amount of pericardial fluid. Hepatobiliary: Again noted is trace perihepatic ascites. Cholelithiasis. No significant gallbladder distension. No biliary dilatation. Pancreas: Unremarkable. No pancreatic ductal dilatation or surrounding inflammatory changes. Spleen: Again noted is a small peripherally calcified low-density structure in the medial spleen on image 18/2. Suspect this is an incidental finding. Spleen size is normal. Adrenals/Urinary Tract: Normal adrenal glands. Normal appearance of both kidneys without hydronephrosis. Negative for kidney stones. No suspicious renal lesions. Normal  appearance of the urinary bladder. Stomach/Bowel: Dilated loops small bowel with some adjacent mesenteric edema. There is a small bowel transition point on image 47/2. Etiology could represent adhesions versus internal hernia. Small pockets of peripheral gas in small bowel loops in the mid lower abdomen with adjacent mesenteric edema. Findings raise concern for possible pneumatosis in these areas. Scattered colonic diverticula without colonic inflammation. Small bowel loops associated with midline ventral hernia. No significant gastric distension. Proximal small bowel is decompressed. Vascular/Lymphatic: Aortic atherosclerosis. Mildly prominent left external iliac lymph node on image 78/2 measures 1.2 cm in the short axis and minimal change from the recent comparison examination. Previously this lymph node was PET avid. Reproductive: Enlarged prostate with fiducial markers. Penile prosthesis implants. Other: Trace fluid in the pelvis. Trace perihepatic ascites. Large ventral hernia containing loops of dilated small bowel. Negative for pneumoperitoneum. Musculoskeletal: Disc space narrowing at L2-L3. No acute bone abnormality. IMPRESSION: 1. Dilated small bowel loops compatible with a small bowel obstruction. There is a small amount of mesenteric edema adjacent to dilated small bowel loops and cannot exclude developing pneumatosis. Transition point in small bowel is identified in the mid abdomen. There are decompressed small bowel loops proximal and distal to the dilated loops of small bowel which raises concern for an internal hernia as the etiology for the small bowel obstruction. 2. Large ventral hernia containing loops of dilated small bowel but the ventral hernia does not appear to be the cause for the small bowel obstruction. 3. Small amount of abdominopelvic ascites. 4.  Aortic Atherosclerosis (ICD10-I70.0). 5. Cholelithiasis. 6. Mildly prominent left external iliac lymph node has minimally changed. Critical  Value/emergent results were called by telephone at the time of interpretation on 12/28/2023 at 10:27 am to provider [facility] ZAMMIT , who verbally acknowledged these results. Electronically Signed   By: Juliene Balder M.D.   On: 12/28/2023 11:29    Alm Schneider, DO  [facility]  If 7PM-7AM, please contact night-coverage [email or web address] Password [email or web address] 12/29/2023, 3:31 PM   LOS: 1 day

## 2023-12-29 NOTE — Evaluation (Signed)
 Physical Therapy Evaluation Patient Details Name: Kevin [name] MRN: [id number] DOB: 02-Jul-1945 Today's Date: 12/29/2023  History of Present Illness  Kevin [name] is a 78 year old male with a history of coronary artery disease status post LAD and circumflex stenting 2001 and 2004, 02/14/2015 revealed diffuse LAD in-stent restenosis which was restented, diabetes mellitus type 2, hypertension, hyperlipidemia, prostate cancer, hypothyroidism, SBO from volvulus presenting (2023) with 1 day history of abdominal pain with nausea and vomiting.  The patient states that he had been off of his Mounjaro for about 2 months because of his prostate cancer treatments.  He restarted it on 12/26/2023.  On the morning of 12/27/2023, the patient began developing abdominal pain.  This worsened and he began developing nausea and vomiting and dry heaving.  As result, he presented for further evaluation and treatment.  He denies any other new medications.  He denies any fevers, chills, chest pain, cough, hemoptysis, hematochezia, melena, dysuria, hematuria.     In the ED, the patient was afebrile hemodynamically stable with oxygen saturation 1 9% room air.  WBC 5.9, hemoglobin 9.9, platelets 121.  Sodium 130, potassium 4.4, bicarbonate 24, serum creatinine 1.67.  LFTs unremarkable.  CT abdomen pelvis showed mesenteric edema adjacent to dilated small bowel loops.  There was a transition point in the small bowel in the midabdomen.  There is a large ventral hernia with dilated small bowel loops, but the ventral hernia does not appear to be cause of SBO.  General surgery was consulted to assist with management.  NG was placed for decompression.   Clinical Impression  Patient agreeable to PT evaluation. On this date, pt is mod independent with bed mobility, and requires CGA/supervision with transfers and ambulation with RW. Mild deficits of unsteadiness noted without RW. Patient is limited mostly due to abdominal pain during session.  Patient reports at baseline he is independent with ADLs and mobility. Patient and niece (per SW) confirm patient does not have any equipment at home. Order placed for RW this date. Patient tolerated sitting in recliner at end of session. Nursing staff notified. Patient will benefit from continued skilled physical therapy acutely and in recommended venue in order to address current deficits to make a safe return home and improve function.        If plan is discharge home, recommend the following: A little help with walking and/or transfers;A little help with bathing/dressing/bathroom;Assist for transportation   Can travel by private vehicle        Equipment Recommendations Rolling walker (2 wheels)  Recommendations for Other Services       Functional Status Assessment Patient has had a recent decline in their functional status and demonstrates the ability to make significant improvements in function in a reasonable and predictable amount of time.     Precautions / Restrictions Precautions Precautions: Fall Recall of Precautions/Restrictions: Intact Restrictions Weight Bearing Restrictions Per Provider Order: No      Mobility  Bed Mobility Overal bed mobility: Needs Assistance Bed Mobility: Supine to Sit     Supine to sit: Modified independent (Device/Increase time), HOB elevated     General bed mobility comments: HOB elevated as pt reports sleeping in recliner at ILF, no physical assist, inc time due to slow labored movement secondary to abdomen pain    Transfers Overall transfer level: Needs assistance Equipment used: Rolling walker (2 wheels), None Transfers: Sit to/from Stand, Bed to chair/wheelchair/BSC Sit to Stand: Modified independent (Device/Increase time), Supervision   Step pivot transfers: Contact guard  assist, Supervision       General transfer comment: STS from bed and recliner without AD, pt takes short side steps from bed to recliner during transfer,  very mild unsteadiness, pt demo dec eccentric control during sit, reports it as due to inc abdomen pain,    Ambulation/Gait Ambulation/Gait assistance: Contact guard assist, Supervision Gait Distance (Feet): 20 Feet Assistive device: Rolling walker (2 wheels) Gait Pattern/deviations: Decreased step length - right, Step-through pattern, Decreased step length - left, Decreased stride length Gait velocity: Dec     General Gait Details: Limited to a few forward/backward steps in room due to NG tube attachment. Slow labored movement as to avoid inc pain, pt ambulates with RW, demo improved balance as no unsteadiness of note. Pt requires assist with managing of lines  Stairs            Wheelchair Mobility     Tilt Bed    Modified Rankin (Stroke Patients Only)       Balance Overall balance assessment: Needs assistance Sitting-balance support: No upper extremity supported, Feet supported Sitting balance-Leahy Scale: Good Sitting balance - Comments: Seated EOB   Standing balance support: During functional activity, No upper extremity supported, Bilateral upper extremity supported Standing balance-Leahy Scale: Fair Standing balance comment: Good with RW, fair without RW           Pertinent Vitals/Pain Pain Assessment Pain Assessment: 0-10 Pain Score: 5  Pain Location: Abdomen Pain Descriptors / Indicators: Sharp Pain Intervention(s): Limited activity within patient's tolerance, Repositioned, Monitored during session    Home Living Family/patient expects to be discharged to:: Other (Comment)     Additional Comments: Pt is a resident of the [facility]    Prior Function Prior Level of Function : Independent/Modified Independent     Mobility Comments: Pt reports independence with ambulation, no AD use ADLs Comments: Pt reports independence with ADLs, reports ILF manages iADLs.     Extremity/Trunk Assessment   Upper Extremity  Assessment Upper Extremity Assessment: Overall WFL for tasks assessed;Generalized weakness (Shoulder flexion ROM slightly limited (~120) bilat but within functional limits. MMT 4/5 bilat)    Lower Extremity Assessment Lower Extremity Assessment: Generalized weakness;Overall WFL for tasks assessed (Ankle DF MMT 4+/5 bilat, hip flexion MMT 4/5 bilat)    Cervical / Trunk Assessment Cervical / Trunk Assessment: Normal  Communication   Communication Communication: No apparent difficulties    Cognition Arousal: Alert Behavior During Therapy: WFL for tasks assessed/performed   PT - Cognitive impairments: No apparent impairments       Following commands: Intact       Cueing Cueing Techniques: Verbal cues, Visual cues     General Comments      Exercises     Assessment/Plan    PT Assessment Patient needs continued PT services;All further PT needs can be met in the next venue of care  PT Problem List Decreased activity tolerance;Decreased strength;Decreased range of motion;Pain;Decreased balance       PT Treatment Interventions DME instruction;Balance training;Gait training;Functional mobility training;Therapeutic activities;Therapeutic exercise;Patient/family education    PT Goals (Current goals can be found in the Care Plan section)  Acute Rehab PT Goals Patient Stated Goal: return to ILF with [facility] services PT Goal Formulation: With patient Time For Goal Achievement: 01/01/24 Potential to Achieve Goals: Good    Frequency Min 3X/week     Co-evaluation               AM-PAC PT 6 Clicks Mobility  Outcome  Measure Help needed turning from your back to your side while in a flat bed without using bedrails?: None Help needed moving from lying on your back to sitting on the side of a flat bed without using bedrails?: None Help needed moving to and from a bed to a chair (including a wheelchair)?: A Little Help needed standing up from a chair using your arms (e.g.,  wheelchair or bedside chair)?: A Little Help needed to walk in hospital room?: A Little Help needed climbing 3-5 steps with a railing? : A Lot 6 Click Score: 19    End of Session Equipment Utilized During Treatment: Other (comment) (Pt had NG Tube, catheter, and IV) Activity Tolerance: Patient tolerated treatment well;Patient limited by pain Patient left: in chair;with call bell/phone within reach Nurse Communication: Mobility status PT Visit Diagnosis: Pain;Other abnormalities of gait and mobility (R26.89);Unsteadiness on feet (R26.81) Pain - part of body:  (Abdomen)    Time: 8958-8941 PT Time Calculation (min) (ACUTE ONLY): 17 min   Charges:   PT Evaluation $PT Eval Low Complexity: 1 Low   PT General Charges $$ ACUTE PT VISIT: 1 Visit        11:45 AM, 12/29/23 Rosaria Settler, PT, DPT [place] with [facility]

## 2023-12-30 DIAGNOSIS — K56609 Unspecified intestinal obstruction, unspecified as to partial versus complete obstruction: Secondary | ICD-10-CM | POA: Diagnosis not present

## 2023-12-30 LAB — GLUCOSE, CAPILLARY
Glucose-Capillary: 117 mg/dL — ABNORMAL HIGH (ref 70–99)
Glucose-Capillary: 104 mg/dL — ABNORMAL HIGH (ref 70–99)
Glucose-Capillary: 112 mg/dL — ABNORMAL HIGH (ref 70–99)
Glucose-Capillary: 110 mg/dL — ABNORMAL HIGH (ref 70–99)
Glucose-Capillary: 143 mg/dL — ABNORMAL HIGH (ref 70–99)
Glucose-Capillary: 128 mg/dL — ABNORMAL HIGH (ref 70–99)

## 2023-12-30 LAB — BASIC METABOLIC PANEL WITH GFR
Anion gap: 9 (ref 5–15)
BUN: 30 mg/dL — ABNORMAL HIGH (ref 8–23)
CO2: 24 mmol/L (ref 22–32)
Calcium: 8.8 mg/dL — ABNORMAL LOW (ref 8.9–10.3)
Chloride: 102 mmol/L (ref 98–111)
Creatinine, Ser: 1.87 mg/dL — ABNORMAL HIGH (ref 0.61–1.24)
GFR, Estimated: 37 mL/min — ABNORMAL LOW (ref >60)
Glucose, Bld: 115 mg/dL — ABNORMAL HIGH (ref 70–99)
Potassium: 4.3 mmol/L (ref 3.5–5.1)
Sodium: 135 mmol/L (ref 135–145)

## 2023-12-30 LAB — CBC
HCT: 27 % — ABNORMAL LOW (ref 39.0–52.0)
Hemoglobin: 9 g/dL — ABNORMAL LOW (ref 13.0–17.0)
MCH: 33.5 pg (ref 26.0–34.0)
MCHC: 33.3 g/dL (ref 30.0–36.0)
MCV: 100.4 fL — ABNORMAL HIGH (ref 80.0–100.0)
Platelets: 124 K/uL — ABNORMAL LOW (ref 150–400)
RBC: 2.69 MIL/uL — ABNORMAL LOW (ref 4.22–5.81)
RDW: 13.6 % (ref 11.5–15.5)
WBC: 3.9 K/uL — ABNORMAL LOW (ref 4.0–10.5)
nRBC: 0 % (ref 0.0–0.2)

## 2023-12-30 LAB — PHOSPHORUS: Phosphorus: 3.5 mg/dL (ref 2.5–4.6)

## 2023-12-30 LAB — MAGNESIUM: Magnesium: 1.7 mg/dL (ref 1.7–2.4)

## 2023-12-30 MED ORDER — CHLORHEXIDINE GLUCONATE CLOTH 2 % EX PADS
6.0000 | MEDICATED_PAD | Freq: Every day | CUTANEOUS | Status: DC
  Administered 2023-12-31 – 2024-01-01 (×2): 6 via TOPICAL

## 2023-12-30 MED ORDER — SODIUM CHLORIDE 0.9 % IV SOLN: INTRAVENOUS | Status: AC

## 2023-12-30 MED ORDER — INSULIN ASPART 100 UNIT/ML IJ SOLN: 0.0000 [IU] | Freq: Every day | INTRAMUSCULAR | Status: DC

## 2023-12-30 MED ORDER — INSULIN ASPART 100 UNIT/ML IJ SOLN: 0.0000 [IU] | Freq: Three times a day (TID) | INTRAMUSCULAR | Status: DC

## 2023-12-30 MED ORDER — MELATONIN 3 MG PO TABS
3.0000 mg | ORAL_TABLET | Freq: Every evening | ORAL | Status: DC | PRN
  Administered 2023-12-30 – 2023-12-31 (×2): 3 mg via ORAL
  Filled 2023-12-30 (×4): qty 1

## 2023-12-30 MED ORDER — ACETAMINOPHEN 325 MG PO TABS
650.0000 mg | ORAL_TABLET | Freq: Four times a day (QID) | ORAL | Status: DC | PRN
  Administered 2023-12-30: 650 mg via ORAL
  Filled 2023-12-30: qty 2

## 2023-12-30 NOTE — Progress Notes (Signed)
 PROGRESS NOTE    Kevin [name]  FMW:[id number] DOB: 1946/03/08 DOA: 12/28/2023 PCP: Marvine Rush, MD   Brief Narrative:    78 year old male with a history of coronary artery disease status post LAD and circumflex stenting 2001 and 2004, 02/14/2015 revealed diffuse LAD in-stent restenosis which was restented, diabetes mellitus type 2, hypertension, hyperlipidemia, prostate cancer, hypothyroidism, SBO from volvulus presenting (2023) with 1 day history of abdominal pain with nausea and vomiting. The patient states that he had been off of his Mounjaro for about 2 months because of his prostate cancer treatments. He restarted it on 12/26/2023. On the morning of 12/27/2023, the patient began developing abdominal pain.  Patient was admitted for small bowel obstruction on 8/25 and underwent ex lap with LOA and primary repair of ventral hernia on that same day.  Assessment & Plan:   Principal Problem:   Small bowel obstruction (HCC) Active Problems:   Coronary artery disease, hx LAD and LCX stenting in 2001 & 2004, last cath 2009 with patent stents.   Essential hypertension   Obesity (BMI 30-39.9)   Type 2 diabetes mellitus with hyperglycemia (HCC)   Mixed hyperlipidemia   Chronic kidney disease, stage 3a (HCC)   Ventral hernia without obstruction or gangrene   Intra-abdominal adhesions  Assessment and Plan:   Small bowel obstruction -General Surgery consult appreciated - 12/28/23--Ex lap with LOA and primary repair of ventral hernia  - NG tube was almost coming out on 8/27 and it appears that it has been removed - Judicious opioids - Advance diet per general surgery, noted to have some creatinine elevation and plan will be to start IV fluid for now until diet advances   Essential hypertension - Holding metoprolol  due to soft BPs - Holding ARB   AKI on CKD stage IIIa - Baseline creatinine 1.2-1.4 -Maintained on some gentle IV fluid - Monitor BMP   Coronary artery disease - History  of LAD and Lcx stents 2001 and 2004 - Restented LAD 2016 - No chest pain presently - personally reviewed EKG--sinus, no STT changes - Holding aspirin  and Plavix  temporarily>>restart when ok with general surgery   Mixed hyperlipidemia -Holding statin until able to tolerate p.o.   Diabetes mellitus type 2 with hyperglycemia - NovoLog  sliding scale - 06/30/2023 hemoglobin A1c--6.4 - NovoLog  scale   Prostate cancer - Patient finished radiation approximately 2 weeks prior to admission   Obesity, class I - BMI 33.48 - Lifestyle modification   Hypothyroidism - Restart Synthroid  once able to tolerate p.o.    DVT prophylaxis:Heparin  Code Status: Full Family Communication: None at bedside Disposition Plan:  Status is: Inpatient Remains inpatient appropriate because: Need for IV fluid   Consultants:  General surgery  Procedures:  8/25-ex lap with LOA and primary repair of ventral hernia  Antimicrobials:  Anti-infectives (From admission, onward)    Start     Dose/Rate Route Frequency Ordered Stop   12/28/23 1530  ciprofloxacin  (CIPRO ) IVPB 400 mg        400 mg 200 mL/hr over 60 Minutes Intravenous On call to O.R. 12/28/23 1518 12/28/23 1619   12/28/23 1530  metroNIDAZOLE  (FLAGYL ) IVPB 500 mg        500 mg 100 mL/hr over 60 Minutes Intravenous On call to O.R. 12/28/23 1518 12/28/23 1640       Subjective: Patient seen and evaluated today NG tube removed this morning as it was about to dislodge.  No nausea or vomiting currently noted or abdominal pain.  Denies any flatus  or bowel movements.  Objective: Vitals:   12/29/23 1500 12/29/23 2015 12/29/23 2018 12/30/23 0841  BP: (!) 125/58 (!) 99/45 117/61 133/64  Pulse: 86 99  (!) 111  Resp: 18 18  18   Temp: 97.8 F (36.6 C) 98.3 F (36.8 C)  98 F (36.7 C)  TempSrc: Oral Oral  Oral  SpO2: 96% 91%    Weight:      Height:        Intake/Output Summary (Last 24 hours) at 12/30/2023 1029 Last data filed at 12/30/2023  0740 Gross per 24 hour  Intake 736.54 ml  Output 575 ml  Net 161.54 ml   Filed Weights   12/28/23 0714 12/28/23 1254 12/28/23 1531  Weight: 104.3 kg 107.7 kg 104.3 kg    Examination:  General exam: Appears calm and comfortable  Respiratory system: Clear to auscultation. Respiratory effort normal. Cardiovascular system: S1 & S2 heard, RRR.  Gastrointestinal system: Abdomen is soft Central nervous system: Alert and awake Extremities: No edema Skin: No significant lesions noted Psychiatry: Flat affect.    Data Reviewed: I have personally reviewed following labs and imaging studies  CBC: Recent Labs  Lab 12/28/23 0755 12/29/23 0444 12/30/23 0435  WBC 5.9 4.2 3.9*  HGB 9.9* 9.6* 9.0*  HCT 29.8* 29.1* 27.0*  MCV 97.7 98.3 100.4*  PLT 121* 130* 124*   Basic Metabolic Panel: Recent Labs  Lab 12/28/23 0755 12/29/23 0444 12/30/23 0435  NA 130* 131* 135  K 4.4 4.4 4.3  CL 94* 99 102  CO2 24 23 24   GLUCOSE 148* 130* 115*  BUN 29* 26* 30*  CREATININE 1.67* 1.44* 1.87*  CALCIUM 9.8 8.8* 8.8*  MG  --   --  1.7  PHOS  --   --  3.5   GFR: Estimated Creatinine Clearance: 39.7 mL/min (A) (by C-G formula based on SCr of 1.87 mg/dL (H)). Liver Function Tests: Recent Labs  Lab 12/28/23 0755  AST 13*  ALT 12  ALKPHOS 46  BILITOT 1.1  PROT 6.8  ALBUMIN  3.9   Recent Labs  Lab 12/28/23 0755  LIPASE 27   No results for input(s): AMMONIA in the last 168 hours. Coagulation Profile: No results for input(s): INR, PROTIME in the last 168 hours. Cardiac Enzymes: No results for input(s): CKTOTAL, CKMB, CKMBINDEX, TROPONINI in the last 168 hours. BNP (last 3 results) No results for input(s): PROBNP in the last 8760 hours. HbA1C: Recent Labs    12/28/23 1433  HGBA1C 6.1*   CBG: Recent Labs  Lab 12/29/23 1756 12/29/23 2149 12/30/23 0230 12/30/23 0605 12/30/23 1023  GLUCAP 132* 126* 117* 104* 112*   Lipid Profile: No results for input(s):  CHOL, HDL, LDLCALC, TRIG, CHOLHDL, LDLDIRECT in the last 72 hours. Thyroid  Function Tests: No results for input(s): TSH, T4TOTAL, FREET4, T3FREE, THYROIDAB in the last 72 hours. Anemia Panel: No results for input(s): VITAMINB12, FOLATE, FERRITIN, TIBC, IRON, RETICCTPCT in the last 72 hours. Sepsis Labs: Recent Labs  Lab 12/28/23 1111 12/28/23 1239  LATICACIDVEN 1.0 1.2    No results found for this or any previous visit (from the past 240 hours).       Radiology Studies: DG Abd Portable 1V Result Date: 12/28/2023 CLINICAL DATA:  Nasogastric tube placement. EXAM: PORTABLE ABDOMEN - 1 VIEW COMPARISON:  CT earlier today FINDINGS: Tip and side port of the enteric tube below the diaphragm in the stomach. Bowel dilatation on CT is not included in the field of view. IMPRESSION: Tip and side port of  the enteric tube below the diaphragm in the stomach. Electronically Signed   By: Andrea Gasman M.D.   On: 12/28/2023 12:40        Scheduled Meds:  acetaminophen   1,000 mg Per Tube Q6H   bisacodyl   10 mg Rectal Daily   Chlorhexidine  Gluconate Cloth  6 each Topical Q0600   heparin   5,000 Units Subcutaneous Q8H   insulin  aspart  0-9 Units Subcutaneous Q4H   Continuous Infusions:  lactated ringers  75 mL/hr at 12/30/23 1000     LOS: 2 days    Time spent: 55 minutes    Margery Szostak D Maree, DO [facility]  If 7PM-7AM, please contact night-coverage [email or web address] 12/30/2023, 10:29 AM

## 2023-12-30 NOTE — Progress Notes (Signed)
 PT Cancellation Note  Patient Details Name: Kevin [name] MRN: [id number] DOB: [birth year]   Cancelled Treatment:    Reason Eval/Treat Not Completed: Patient declined, no reason specified Attempted PT session, pt sitting in chair upon entrance and stated he has already walked a lap around the dept and declined another trip.  Pt left in chair with call bell within reach.  [name], LPTA/CLT; CBIS [phone]  Kevin [name] Amble 12/30/2023, 3:37 PM

## 2023-12-30 NOTE — Progress Notes (Signed)
 Mobility Specialist Progress Note:    12/30/23 1210  Mobility  Activity Ambulated with assistance  Level of Assistance Standby assist, set-up cues, supervision of patient - no hands on  Assistive Device Front wheel walker  Distance Ambulated (ft) 200 ft  Range of Motion/Exercises Active;All extremities  Activity Response Tolerated well  Mobility Referral Yes  Mobility visit 1 Mobility  Mobility Specialist Start Time (ACUTE ONLY) 1210  Mobility Specialist Stop Time (ACUTE ONLY) 1230  Mobility Specialist Time Calculation (min) (ACUTE ONLY) 20 min   Pt received in bed, agreeable to mobility. Required supervision to stand and ambulate with RW. Tolerated well,asx throughout. Returned supine, all needs met.  Tycho Cheramie Mobility Specialist Please contact via Special educational needs teacher or  Rehab office at [phone]

## 2023-12-30 NOTE — Progress Notes (Signed)
 [facility] Progress Note  2 Days Post-Op  Subjective: Patient seen and examined.  He is sitting in the chair at the side of the bed.  His abdominal pain is fairly well-controlled at this time.  He denies any flatus or bowel movement since surgery.  His NG tube is had minimal output and has slightly retracted.  Objective: Vital signs in last 24 hours: Temp:  [97.8 F (36.6 C)-98.3 F (36.8 C)] 98 F (36.7 C) (08/27 0841) Pulse Rate:  [86-111] 111 (08/27 0841) Resp:  [18] 18 (08/27 0841) BP: (99-133)/(45-64) 133/64 (08/27 0841) SpO2:  [91 %-96 %] 91 % (08/26 2015) Last BM Date : 12/28/23 (0400 per pt)  Intake/Output from previous day: 08/26 0701 - 08/27 0700 In: 736.5 [I.V.:736.5] Out: 275 [Urine:275] Intake/Output this shift: Total I/O In: -  Out: 300 [Urine:300]  General appearance: alert, cooperative, and no distress Nose: NG tube in place with no output GI: Abdomen soft, nondistended, no percussion tenderness, minimal tenderness to palpation along incision site; no rigidity, guarding, rebound tenderness; midline incision C/D/I with honeycomb dressing in place, with a small amount of dried bloody strikethrough  Lab Results:  Recent Labs    12/29/23 0444 12/30/23 0435  WBC 4.2 3.9*  HGB 9.6* 9.0*  HCT 29.1* 27.0*  PLT 130* 124*   BMET Recent Labs    12/29/23 0444 12/30/23 0435  NA 131* 135  K 4.4 4.3  CL 99 102  CO2 23 24  GLUCOSE 130* 115*  BUN 26* 30*  CREATININE 1.44* 1.87*  CALCIUM 8.8* 8.8*   PT/INR No results for input(s): LABPROT, INR in the last 72 hours.  Studies/Results: No results found.  Anti-infectives: Anti-infectives (From admission, onward)    Start     Dose/Rate Route Frequency Ordered Stop   12/28/23 1530  ciprofloxacin  (CIPRO ) IVPB 400 mg        400 mg 200 mL/hr over 60 Minutes Intravenous On call to O.R. 12/28/23 1518 12/28/23 1619   12/28/23 1530  metroNIDAZOLE  (FLAGYL ) IVPB 500 mg        500 mg 100  mL/hr over 60 Minutes Intravenous On call to O.R. 12/28/23 1518 12/28/23 1640       Assessment/Plan:  Patient is a 78 year old male who was admitted with small bowel obstruction and concern for possible closed-loop pathology.  He is status post exploratory laparotomy, lysis of adhesions, and primary repair of ventral hernia on 8/25.   -Patient without leukocytosis today and hemoglobin stable.  Will continue to monitor for signs of bleeding, especially given Plavix  being on board at the time of surgery -Given the NG tube has slightly retracted, will remove at this time. -Okay for ice chips and small sips of water.  Will not advance to a clear liquid diet until patient begins having flatus -Monitor for return of bowel function -Continue Dulcolax suppository -IVF -PRN pain control and antiemetics -Appreciate hospitalist recommendations   LOS: 2 days    Layla Kesling A Nada Godley 12/30/2023  Note: Portions of this report may have been transcribed using voice recognition software. Every effort has been made to ensure accuracy; however, inadvertent computerized transcription errors may still be present.

## 2023-12-31 ENCOUNTER — Inpatient Hospital Stay (HOSPITAL_COMMUNITY)

## 2023-12-31 DIAGNOSIS — Z4682 Encounter for fitting and adjustment of non-vascular catheter: Secondary | ICD-10-CM | POA: Diagnosis not present

## 2023-12-31 DIAGNOSIS — K56609 Unspecified intestinal obstruction, unspecified as to partial versus complete obstruction: Secondary | ICD-10-CM | POA: Diagnosis not present

## 2023-12-31 DIAGNOSIS — R140 Abdominal distension (gaseous): Secondary | ICD-10-CM | POA: Diagnosis not present

## 2023-12-31 LAB — CBC
HCT: 25.2 % — ABNORMAL LOW (ref 39.0–52.0)
Hemoglobin: 8.2 g/dL — ABNORMAL LOW (ref 13.0–17.0)
MCH: 32.9 pg (ref 26.0–34.0)
MCHC: 32.5 g/dL (ref 30.0–36.0)
MCV: 101.2 fL — ABNORMAL HIGH (ref 80.0–100.0)
Platelets: 134 K/uL — ABNORMAL LOW (ref 150–400)
RBC: 2.49 MIL/uL — ABNORMAL LOW (ref 4.22–5.81)
RDW: 13.3 % (ref 11.5–15.5)
WBC: 2.8 K/uL — ABNORMAL LOW (ref 4.0–10.5)
nRBC: 0 % (ref 0.0–0.2)

## 2023-12-31 LAB — BASIC METABOLIC PANEL WITH GFR
Anion gap: 7 (ref 5–15)
BUN: 29 mg/dL — ABNORMAL HIGH (ref 8–23)
CO2: 25 mmol/L (ref 22–32)
Calcium: 8.9 mg/dL (ref 8.9–10.3)
Chloride: 103 mmol/L (ref 98–111)
Creatinine, Ser: 1.58 mg/dL — ABNORMAL HIGH (ref 0.61–1.24)
GFR, Estimated: 45 mL/min — ABNORMAL LOW (ref >60)
Glucose, Bld: 91 mg/dL (ref 70–99)
Potassium: 3.8 mmol/L (ref 3.5–5.1)
Sodium: 135 mmol/L (ref 135–145)

## 2023-12-31 LAB — MAGNESIUM: Magnesium: 1.8 mg/dL (ref 1.7–2.4)

## 2023-12-31 LAB — GLUCOSE, CAPILLARY
Glucose-Capillary: 91 mg/dL (ref 70–99)
Glucose-Capillary: 106 mg/dL — ABNORMAL HIGH (ref 70–99)
Glucose-Capillary: 115 mg/dL — ABNORMAL HIGH (ref 70–99)
Glucose-Capillary: 110 mg/dL — ABNORMAL HIGH (ref 70–99)

## 2023-12-31 MED ORDER — METOPROLOL SUCCINATE ER 50 MG PO TB24
100.0000 mg | ORAL_TABLET | Freq: Every day | ORAL | Status: DC
  Administered 2023-12-31 – 2024-01-04 (×5): 100 mg via ORAL
  Filled 2023-12-31 (×5): qty 2

## 2023-12-31 MED ORDER — IRBESARTAN 150 MG PO TABS
150.0000 mg | ORAL_TABLET | Freq: Every day | ORAL | Status: DC
  Administered 2023-12-31 – 2024-01-01 (×2): 150 mg via ORAL
  Filled 2023-12-31 (×2): qty 1

## 2023-12-31 MED ORDER — METOPROLOL SUCCINATE ER 50 MG PO TB24
50.0000 mg | ORAL_TABLET | Freq: Every day | ORAL | Status: DC
  Administered 2023-12-31 – 2024-01-03 (×4): 50 mg via ORAL
  Filled 2023-12-31 (×4): qty 1

## 2023-12-31 MED ORDER — LEVOTHYROXINE SODIUM 50 MCG PO TABS
50.0000 ug | ORAL_TABLET | Freq: Every day | ORAL | Status: DC
  Administered 2024-01-01 – 2024-01-04 (×4): 50 ug via ORAL
  Filled 2023-12-31 (×4): qty 1

## 2023-12-31 MED ORDER — HYDROCHLOROTHIAZIDE 12.5 MG PO TABS
12.5000 mg | ORAL_TABLET | Freq: Every day | ORAL | Status: DC
  Administered 2023-12-31 – 2024-01-01 (×2): 12.5 mg via ORAL
  Filled 2023-12-31 (×2): qty 1

## 2023-12-31 MED ORDER — METOPROLOL SUCCINATE ER 50 MG PO TB24: 50.0000 mg | ORAL_TABLET | Freq: Two times a day (BID) | ORAL | Status: DC

## 2023-12-31 MED ORDER — PRAVASTATIN SODIUM 40 MG PO TABS
40.0000 mg | ORAL_TABLET | Freq: Every evening | ORAL | Status: DC
  Administered 2023-12-31 – 2024-01-03 (×4): 40 mg via ORAL
  Filled 2023-12-31 (×4): qty 1

## 2023-12-31 MED ORDER — TAMSULOSIN HCL 0.4 MG PO CAPS
0.4000 mg | ORAL_CAPSULE | Freq: Every evening | ORAL | Status: DC
  Administered 2023-12-31 – 2024-01-03 (×4): 0.4 mg via ORAL
  Filled 2023-12-31 (×4): qty 1

## 2023-12-31 MED ORDER — VALSARTAN-HYDROCHLOROTHIAZIDE 160-12.5 MG PO TABS: 1.0000 | ORAL_TABLET | Freq: Every day | ORAL | Status: DC

## 2023-12-31 NOTE — Care Management Important Message (Signed)
 Important Message  Patient Details  Name: JEREME LOREN MRN: [id number] Date of Birth: 1945-08-15   Important Message Given:  Yes - Medicare IM     Shelby Anderle L Kaelah Hayashi 12/31/2023, 12:50 PM

## 2023-12-31 NOTE — Progress Notes (Signed)
 PROGRESS NOTE    Kevin [name]  FMW:[id number] DOB: 07-23-1945 DOA: 12/28/2023 PCP: Marvine Rush, MD   Brief Narrative:    78 year old male with a history of coronary artery disease status post LAD and circumflex stenting 2001 and 2004, 02/14/2015 revealed diffuse LAD in-stent restenosis which was restented, diabetes mellitus type 2, hypertension, hyperlipidemia, prostate cancer, hypothyroidism, SBO from volvulus presenting (2023) with 1 day history of abdominal pain with nausea and vomiting. The patient states that he had been off of his Mounjaro for about 2 months because of his prostate cancer treatments. He restarted it on 12/26/2023. On the morning of 12/27/2023, the patient began developing abdominal pain.  Patient was admitted for small bowel obstruction on 8/25 and underwent ex lap with LOA and primary repair of ventral hernia on that same day.  Assessment & Plan:   Principal Problem:   Small bowel obstruction (HCC) Active Problems:   Coronary artery disease, hx LAD and LCX stenting in 2001 & 2004, last cath 2009 with patent stents.   Essential hypertension   Obesity (BMI 30-39.9)   Type 2 diabetes mellitus with hyperglycemia (HCC)   Mixed hyperlipidemia   Chronic kidney disease, stage 3a (HCC)   Ventral hernia without obstruction or gangrene   Intra-abdominal adhesions  Assessment and Plan:   Small bowel obstruction -General Surgery consult appreciated - 12/28/23--Ex lap with LOA and primary repair of ventral hernia  - NG tube was almost coming out on 8/27 and it appears that it has been removed - Judicious opioids - Advance diet per general surgery, but noted to have some worsening pain even after 2 bowel movements and passing flatus.  KUB ordered per general surgery to assess for postoperative ileus and ambulation encouraged.   Essential hypertension - Holding metoprolol  due to soft BPs - Holding ARB   AKI on CKD stage IIIa-improving - Baseline creatinine  1.2-1.4 -Maintained on some gentle IV fluid - Monitor BMP   Coronary artery disease - History of LAD and Lcx stents 2001 and 2004 - Restented LAD 2016 - No chest pain presently - personally reviewed EKG--sinus, no STT changes - Holding aspirin  and Plavix  temporarily>>restart when ok with general surgery   Mixed hyperlipidemia - Resume statin   Diabetes mellitus type 2 with hyperglycemia - NovoLog  sliding scale - 06/30/2023 hemoglobin A1c--6.4 - NovoLog  scale   Prostate cancer - Patient finished radiation approximately 2 weeks prior to admission   Obesity, class I - BMI 33.48 - Lifestyle modification   Hypothyroidism - Resume Synthroid     DVT prophylaxis:Heparin  Code Status: Full Family Communication: None at bedside Disposition Plan:  Status is: Inpatient Remains inpatient appropriate because: Need for IV fluid   Consultants:  General surgery  Procedures:  8/25-ex lap with LOA and primary repair of ventral hernia  Antimicrobials:  Anti-infectives (From admission, onward)    Start     Dose/Rate Route Frequency Ordered Stop   12/28/23 1530  ciprofloxacin  (CIPRO ) IVPB 400 mg        400 mg 200 mL/hr over 60 Minutes Intravenous On call to O.R. 12/28/23 1518 12/28/23 1619   12/28/23 1530  metroNIDAZOLE  (FLAGYL ) IVPB 500 mg        500 mg 100 mL/hr over 60 Minutes Intravenous On call to O.R. 12/28/23 1518 12/28/23 1640       Subjective: Patient seen and evaluated and states that he had 2 bowel movements earlier today and is passing flatus.  Denies any nausea or vomiting and is tolerating clear liquids,  but is having some worsening abdominal pain.  Objective: Vitals:   12/30/23 1942 12/30/23 2202 12/31/23 0511 12/31/23 1128  BP: 124/60 137/67 138/66 (!) 180/87  Pulse: (!) 101 (!) 101 97 (!) 115  Resp: 20 20 20 20   Temp: 98.4 F (36.9 C) 98.4 F (36.9 C) 98.3 F (36.8 C) 98.2 F (36.8 C)  TempSrc: Oral Oral Oral Oral  SpO2: 93% 94% 99% 96%  Weight:       Height:        Intake/Output Summary (Last 24 hours) at 12/31/2023 1154 Last data filed at 12/31/2023 0549 Gross per 24 hour  Intake --  Output 350 ml  Net -350 ml   Filed Weights   12/28/23 0714 12/28/23 1254 12/28/23 1531  Weight: 104.3 kg 107.7 kg 104.3 kg    Examination:  General exam: Appears calm and comfortable  Respiratory system: Clear to auscultation. Respiratory effort normal. Cardiovascular system: S1 & S2 heard, RRR.  Gastrointestinal system: Abdomen is soft, binder present Central nervous system: Alert and awake Extremities: No edema Skin: No significant lesions noted Psychiatry: Flat affect.    Data Reviewed: I have personally reviewed following labs and imaging studies  CBC: Recent Labs  Lab 12/28/23 0755 12/29/23 0444 12/30/23 0435 12/31/23 0430  WBC 5.9 4.2 3.9* 2.8*  HGB 9.9* 9.6* 9.0* 8.2*  HCT 29.8* 29.1* 27.0* 25.2*  MCV 97.7 98.3 100.4* 101.2*  PLT 121* 130* 124* 134*   Basic Metabolic Panel: Recent Labs  Lab 12/28/23 0755 12/29/23 0444 12/30/23 0435 12/31/23 0430  NA 130* 131* 135 135  K 4.4 4.4 4.3 3.8  CL 94* 99 102 103  CO2 24 23 24 25   GLUCOSE 148* 130* 115* 91  BUN 29* 26* 30* 29*  CREATININE 1.67* 1.44* 1.87* 1.58*  CALCIUM 9.8 8.8* 8.8* 8.9  MG  --   --  1.7 1.8  PHOS  --   --  3.5  --    GFR: Estimated Creatinine Clearance: 47 mL/min (A) (by C-G formula based on SCr of 1.58 mg/dL (H)). Liver Function Tests: Recent Labs  Lab 12/28/23 0755  AST 13*  ALT 12  ALKPHOS 46  BILITOT 1.1  PROT 6.8  ALBUMIN  3.9   Recent Labs  Lab 12/28/23 0755  LIPASE 27   No results for input(s): AMMONIA in the last 168 hours. Coagulation Profile: No results for input(s): INR, PROTIME in the last 168 hours. Cardiac Enzymes: No results for input(s): CKTOTAL, CKMB, CKMBINDEX, TROPONINI in the last 168 hours. BNP (last 3 results) No results for input(s): PROBNP in the last 8760 hours. HbA1C: Recent Labs     12/28/23 1433  HGBA1C 6.1*   CBG: Recent Labs  Lab 12/30/23 1419 12/30/23 1836 12/30/23 1945 12/31/23 0742 12/31/23 1114  GLUCAP 110* 143* 128* 91 106*   Lipid Profile: No results for input(s): CHOL, HDL, LDLCALC, TRIG, CHOLHDL, LDLDIRECT in the last 72 hours. Thyroid  Function Tests: No results for input(s): TSH, T4TOTAL, FREET4, T3FREE, THYROIDAB in the last 72 hours. Anemia Panel: No results for input(s): VITAMINB12, FOLATE, FERRITIN, TIBC, IRON, RETICCTPCT in the last 72 hours. Sepsis Labs: Recent Labs  Lab 12/28/23 1111 12/28/23 1239  LATICACIDVEN 1.0 1.2    No results found for this or any previous visit (from the past 240 hours).       Radiology Studies: No results found.       Scheduled Meds:  bisacodyl   10 mg Rectal Daily   Chlorhexidine  Gluconate Cloth  6 each Topical  Q0600   heparin   5,000 Units Subcutaneous Q8H   insulin  aspart  0-5 Units Subcutaneous QHS   insulin  aspart  0-6 Units Subcutaneous TID WC      LOS: 3 days    Time spent: 55 minutes    Kevin Reach JONETTA Fairly, DO [facility]  If 7PM-7AM, please contact night-coverage [email or web address] 12/31/2023, 11:54 AM

## 2023-12-31 NOTE — Progress Notes (Signed)
 MEWS Progress Note  Patient Details Name: Kevin [name] MRN: [id number] DOB: 06/11/1945 Today's Date: 12/31/2023   MEWS Flowsheet Documentation:  Assess: MEWS Score Temp: 98.2 F (36.8 C) BP: (!) 180/87 MAP (mmHg): 111 Pulse Rate: (!) 115 ECG Heart Rate: 76 Resp: 20 Level of Consciousness: Alert SpO2: 96 % O2 Device: Room Air Patient Activity (if Appropriate): In bed O2 Flow Rate (L/min): 1.5 L/min Assess: MEWS Score MEWS Temp: 0 MEWS Systolic: 0 MEWS Pulse: 2 MEWS RR: 0 MEWS LOC: 0 MEWS Score: 2 MEWS Score Color: Yellow Assess: SIRS CRITERIA SIRS Temperature : 0 SIRS Respirations : 0 SIRS Pulse: 1 SIRS WBC: 1 SIRS Score Sum : 2 SIRS Temperature : 0 SIRS Pulse: 1 SIRS Respirations : 0 SIRS WBC: 1 SIRS Score Sum : 2 Assess: if the MEWS score is Yellow or Red Were vital signs accurate and taken at a resting state?: Yes Does the patient meet 2 or more of the SIRS criteria?: No MEWS guidelines implemented : Yes, yellow Treat MEWS Interventions: Considered administering scheduled or prn medications/treatments as ordered Take Vital Signs Increase Vital Sign Frequency : Yellow: Q2hr x1, continue Q4hrs until patient remains green for 12hrs Escalate MEWS: Escalate: Yellow: Discuss with charge nurse and consider notifying provider and/or RRT Notify: Charge Nurse/RN Name of Charge Nurse/RN Notified: Damien, RN Provider Notification Provider Name/Title: MD Maree armin Brittle Date Provider Notified: 12/31/23 Time Provider Notified: 1131 Method of Notification: Page Notification Reason: Change in status Provider response: Other (Comment) Date of Provider Response: 12/31/23 Time of Provider Response: 1132      [name] 12/31/2023, 11:32 AM

## 2023-12-31 NOTE — Progress Notes (Signed)
 [facility] Progress Note  3 Days Post-Op  Subjective: Patient seen and examined.  He is resting in bed.  He is complaining of felling nauseated and vomiting earlier today.  He states that he has worsening abdominal pain and distention and this started earlier this morning.  He was able to eat some of his clear liquids for breakfast, but then started becoming more nauseated.  Patient has been passing a small amount of flatus and has had some loose bowel movements.  Objective: Vital signs in last 24 hours: Temp:  [98.2 F (36.8 C)-98.4 F (36.9 C)] 98.4 F (36.9 C) (08/28 1422) Pulse Rate:  [97-120] 120 (08/28 1422) Resp:  [20] 20 (08/28 1422) BP: (124-180)/(60-87) 144/80 (08/28 1422) SpO2:  [93 %-99 %] 95 % (08/28 1422) Last BM Date : 12/30/23  Intake/Output from previous day: 08/27 0701 - 08/28 0700 In: -  Out: 650 [Urine:650] Intake/Output this shift: No intake/output data recorded.  General appearance: alert, cooperative, and no distress GI: Abdomen soft, moderate distention, no percussion tenderness, mild incisional tenderness to palpation; no rigidity, guarding, rebound tenderness; midline incision C/D/I with honeycomb dressing in place.  Lab Results:  Recent Labs    12/30/23 0435 12/31/23 0430  WBC 3.9* 2.8*  HGB 9.0* 8.2*  HCT 27.0* 25.2*  PLT 124* 134*   BMET Recent Labs    12/30/23 0435 12/31/23 0430  NA 135 135  K 4.3 3.8  CL 102 103  CO2 24 25  GLUCOSE 115* 91  BUN 30* 29*  CREATININE 1.87* 1.58*  CALCIUM 8.8* 8.9   PT/INR No results for input(s): LABPROT, INR in the last 72 hours.  Studies/Results: DG Abd 1 View Result Date: 12/31/2023 CLINICAL DATA:  Postoperative ileus and nausea. EXAM: ABDOMEN - 1 VIEW COMPARISON:  Radiograph dated 12/28/2023. FINDINGS: Interval removal of the enteric tube. There is moderate gaseous distension of the stomach. There is air distended loops of small bowel measuring up to 4.7 cm in caliber.  Stool and air noted in the colon. Midline vertical anterior abdominal wall cutaneous staples noted. There is degenerative changes of the spine and hips. No acute osseous pathology. Penile implant. IMPRESSION: Findings favor to represent an ileus. Developing obstruction is not excluded. Clinical correlation and follow-up recommended. Electronically Signed   By: Vanetta Chou M.D.   On: 12/31/2023 13:23    Anti-infectives: Anti-infectives (From admission, onward)    Start     Dose/Rate Route Frequency Ordered Stop   12/28/23 1530  ciprofloxacin  (CIPRO ) IVPB 400 mg        400 mg 200 mL/hr over 60 Minutes Intravenous On call to O.R. 12/28/23 1518 12/28/23 1619   12/28/23 1530  metroNIDAZOLE  (FLAGYL ) IVPB 500 mg        500 mg 100 mL/hr over 60 Minutes Intravenous On call to O.R. 12/28/23 1518 12/28/23 1640       Assessment/Plan:  Patient is a 78 year old male who was admitted with small bowel obstruction and concern for possible closed-loop pathology.  He is status post exploratory laparotomy, lysis of adhesions, and primary repair of ventral hernia on 8/25.   -NG tube was removed yesterday, however patient had worsening abdominal distention, pain, nausea, and small episode of emesis today.  Will plan to replace NG tube -Will switch patient back to NPO -KUB is demonstrating distended loops of small bowel and a distended stomach, likely secondary to ileus.  There is gas noted within the colon -Patient without leukocytosis today. -Hemoglobin is slowly downtrending.  Will continue to monitor with daily labs.  Will continue to monitor for signs of bleeding, especially given Plavix  being on board at the time of surgery -Monitor for bowel function -Continue Dulcolax suppository -IVF -PRN pain control and antiemetics -Appreciate hospitalist recommendations   LOS: 3 days    Idonia Zollinger A Muskaan Smet 12/31/2023  Note: Portions of this report may have been transcribed using voice recognition  software. Every effort has been made to ensure accuracy; however, inadvertent computerized transcription errors may still be present.

## 2024-01-01 ENCOUNTER — Inpatient Hospital Stay (HOSPITAL_COMMUNITY)

## 2024-01-01 DIAGNOSIS — K56609 Unspecified intestinal obstruction, unspecified as to partial versus complete obstruction: Secondary | ICD-10-CM | POA: Diagnosis not present

## 2024-01-01 DIAGNOSIS — Z4682 Encounter for fitting and adjustment of non-vascular catheter: Secondary | ICD-10-CM | POA: Diagnosis not present

## 2024-01-01 DIAGNOSIS — K567 Ileus, unspecified: Secondary | ICD-10-CM | POA: Diagnosis not present

## 2024-01-01 LAB — GLUCOSE, CAPILLARY
Glucose-Capillary: 92 mg/dL (ref 70–99)
Glucose-Capillary: 103 mg/dL — ABNORMAL HIGH (ref 70–99)
Glucose-Capillary: 83 mg/dL (ref 70–99)
Glucose-Capillary: 94 mg/dL (ref 70–99)

## 2024-01-01 LAB — COMPREHENSIVE METABOLIC PANEL WITH GFR
ALT: 11 U/L (ref 0–44)
AST: 12 U/L — ABNORMAL LOW (ref 15–41)
Albumin: 3.1 g/dL — ABNORMAL LOW (ref 3.5–5.0)
Alkaline Phosphatase: 35 U/L — ABNORMAL LOW (ref 38–126)
Anion gap: 11 (ref 5–15)
BUN: 24 mg/dL — ABNORMAL HIGH (ref 8–23)
CO2: 28 mmol/L (ref 22–32)
Calcium: 9.5 mg/dL (ref 8.9–10.3)
Chloride: 101 mmol/L (ref 98–111)
Creatinine, Ser: 1.43 mg/dL — ABNORMAL HIGH (ref 0.61–1.24)
GFR, Estimated: 50 mL/min — ABNORMAL LOW (ref >60)
Glucose, Bld: 99 mg/dL (ref 70–99)
Potassium: 4.1 mmol/L (ref 3.5–5.1)
Sodium: 140 mmol/L (ref 135–145)
Total Bilirubin: 0.8 mg/dL (ref 0.0–1.2)
Total Protein: 5.8 g/dL — ABNORMAL LOW (ref 6.5–8.1)

## 2024-01-01 LAB — CBC
HCT: 27.4 % — ABNORMAL LOW (ref 39.0–52.0)
Hemoglobin: 9 g/dL — ABNORMAL LOW (ref 13.0–17.0)
MCH: 33.7 pg (ref 26.0–34.0)
MCHC: 32.8 g/dL (ref 30.0–36.0)
MCV: 102.6 fL — ABNORMAL HIGH (ref 80.0–100.0)
Platelets: 164 K/uL (ref 150–400)
RBC: 2.67 MIL/uL — ABNORMAL LOW (ref 4.22–5.81)
RDW: 13.5 % (ref 11.5–15.5)
WBC: 3.7 K/uL — ABNORMAL LOW (ref 4.0–10.5)
nRBC: 0 % (ref 0.0–0.2)

## 2024-01-01 LAB — MAGNESIUM: Magnesium: 1.9 mg/dL (ref 1.7–2.4)

## 2024-01-01 MED ORDER — HYDRALAZINE HCL 20 MG/ML IJ SOLN: 10.0000 mg | INTRAMUSCULAR | Status: DC | PRN

## 2024-01-01 MED ORDER — LACTATED RINGERS IV SOLN
INTRAVENOUS | Status: DC
  Administered 2024-01-01: 1000 mL via INTRAVENOUS

## 2024-01-01 NOTE — TOC Progression Note (Signed)
 Transition of Care [facility]) - Progression Note    Patient Details  Name: Kevin [name] MRN: [id number] Date of Birth: 04-13-1946  Transition of Care [facility]) CM/SW Contact  Lucie Lunger, [place] Phone Number: 01/01/2024, 9:53 AM  Clinical Narrative:    CSW sent rolling walker referral to [facility] with [employer], it will be delivered to patients room. CSW spoke to [place] with [facility] who states that they can accept [facility] PT/OT referral, MD placed ordered. HH agency info added to AVS. CSW also spoke to [place] with the [facility] who states that pt can return at any time to their facility as he is from ILF. TOC to follow.   Expected Discharge Plan: Home w Home Health Services Barriers to Discharge: Continued Medical Work up               Expected Discharge Plan and Services In-house Referral: Clinical Social Work Discharge Planning Services: CM Consult   Living arrangements for the past 2 months: Independent Living Facility                                       Social Drivers of Health (SDOH) Interventions SDOH Screenings   Food Insecurity: No Food Insecurity (12/28/2023)  Housing: Low Risk  (12/28/2023)  Transportation Needs: No Transportation Needs (12/28/2023)  Utilities: Not At Risk (12/28/2023)  Alcohol Screen: Low Risk  (09/15/2023)  Depression (PHQ2-9): Low Risk  (09/15/2023)  Financial Resource Strain: Low Risk  (08/05/2022)  Physical Activity: Inactive (08/05/2022)  Social Connections: Socially Isolated (12/28/2023)  Stress: No Stress Concern Present (08/05/2022)  Tobacco Use: Medium Risk (12/28/2023)    Readmission Risk Interventions    12/29/2023    9:04 AM 11/04/2023    8:21 AM 09/17/2021   11:27 AM  Readmission Risk Prevention Plan  Transportation Screening Complete Complete Complete  Home Care Screening   Complete  Medication Review (RN CM)   Complete  HRI or Home Care Consult Complete Complete   Social Work Consult for Recovery Care Planning/Counseling Complete Complete    Palliative Care Screening Not Applicable Not Applicable   Medication Review Oceanographer) Complete Complete

## 2024-01-01 NOTE — Progress Notes (Signed)
 [facility] Progress Note  4 Days Post-Op  Subjective: Patient seen and examined.  He is resting comfortably in bed.  He is feeling much better since NG tube placement, as he has had 2 L of output through his NG tube.  He did have a small bowel movement this morning and is passing a little bit of flatus.  His abdominal pain has significantly improved from yesterday.  Objective: Vital signs in last 24 hours: Temp:  [97.8 F (36.6 C)-98.6 F (37 C)] 97.8 F (36.6 C) (08/29 0404) Pulse Rate:  [85-120] 85 (08/29 0404) Resp:  [18-20] 18 (08/29 0404) BP: (126-158)/(68-82) 126/68 (08/29 0404) SpO2:  [95 %-100 %] 95 % (08/29 0404) Last BM Date : 01/01/24  Intake/Output from previous day: 08/28 0701 - 08/29 0700 In: 0  Out: 2550 [Urine:500; Emesis/NG output:2050] Intake/Output this shift: No intake/output data recorded.  General appearance: alert, cooperative, and no distress Nose: NG tube in place with brown/bilious output in canister GI: Abdomen soft, nondistended, no percussion tenderness, nontender to palpation; no rigidity, guarding, rebound tenderness; midline incision C/D/I with skin staples in place  Lab Results:  Recent Labs    12/31/23 0430 01/01/24 0402  WBC 2.8* 3.7*  HGB 8.2* 9.0*  HCT 25.2* 27.4*  PLT 134* 164   BMET Recent Labs    12/31/23 0430 01/01/24 0402  NA 135 140  K 3.8 4.1  CL 103 101  CO2 25 28  GLUCOSE 91 99  BUN 29* 24*  CREATININE 1.58* 1.43*  CALCIUM 8.9 9.5   PT/INR No results for input(s): LABPROT, INR in the last 72 hours.  Studies/Results: DG Abd 1 View Result Date: 01/01/2024 EXAM: 1 VIEW XRAY OF THE ABDOMEN 01/01/2024 08:47:46 AM COMPARISON: 12/31/2023 abdominal radiograph CLINICAL HISTORY: Postoperative ileus, small bowel obstruction. FINDINGS: BOWEL: Generalized mild-to-moderate small bowel dilatation has improved. Mild-to-moderate diffuse colonic stool. No pneumatosis or pneumoperitoneum. SOFT TISSUES:  Vertical skin staples overall the abdomen. Fiducial markers overlie the region of the prostate. Enteric tube terminates over the gastric fundus with side port just below the esophagus gastric junction. BONES: Moderate lumbar spondylosis. No acute osseous abnormality. IMPRESSION: 1. Improving postoperative ileus. Electronically signed by: Selinda Blue MD 01/01/2024 09:54 AM EDT RP Workstation: [id number]   DG Abd Portable 1V Result Date: 12/31/2023 EXAM: 1 VIEW XRAY OF THE ABDOMEN 12/31/2023 04:16:00 PM COMPARISON: 12/31/2023 CLINICAL HISTORY: S/p NGT placement today, hx sbo FINDINGS: BOWEL: Persistent gaseous distention of small bowel is stable. SOFT TISSUES: No opaque urinary calculi. BONES: No acute osseous abnormality. LINES AND TUBES: NG tube in place with tip in proximal stomach. Side port near GE junction. Consider advancing several centimeters. IMPRESSION: 1. NG tube in place with tip in proximal stomach and side port near GE junction. Consider advancing several centimeters. 2. Distended stomach with air. 3. Persistent gaseous distention of small bowel, stable. Electronically signed by: Lonni Necessary MD 12/31/2023 04:24 PM EDT RP Workstation: [id number]   DG Abd 1 View Result Date: 12/31/2023 CLINICAL DATA:  Postoperative ileus and nausea. EXAM: ABDOMEN - 1 VIEW COMPARISON:  Radiograph dated 12/28/2023. FINDINGS: Interval removal of the enteric tube. There is moderate gaseous distension of the stomach. There is air distended loops of small bowel measuring up to 4.7 cm in caliber. Stool and air noted in the colon. Midline vertical anterior abdominal wall cutaneous staples noted. There is degenerative changes of the spine and hips. No acute osseous pathology. Penile implant. IMPRESSION: Findings favor to represent an ileus. Developing obstruction is not  excluded. Clinical correlation and follow-up recommended. Electronically Signed   By: Vanetta Chou M.D.   On: 12/31/2023 13:23     Anti-infectives: Anti-infectives (From admission, onward)    Start     Dose/Rate Route Frequency Ordered Stop   12/28/23 1530  ciprofloxacin  (CIPRO ) IVPB 400 mg        400 mg 200 mL/hr over 60 Minutes Intravenous On call to O.R. 12/28/23 1518 12/28/23 1619   12/28/23 1530  metroNIDAZOLE  (FLAGYL ) IVPB 500 mg        500 mg 100 mL/hr over 60 Minutes Intravenous On call to O.R. 12/28/23 1518 12/28/23 1640       Assessment/Plan:  Patient is a 78 year old male who was admitted with small bowel obstruction and concern for possible closed-loop pathology.  He is status post exploratory laparotomy, lysis of adhesions, and primary repair of ventral hernia on 8/25.   -Patient with improved abdominal distention after NG tube insertion -Will maintain NG tube today on LIS, NPO -KUB is demonstrating improved postoperative ileus since NG tube decompression -Potentially will trial clamping NG tube tomorrow pending output and initiating clear liquids -Patient without leukocytosis today. -Hemoglobin stable -Monitor for bowel function -Continue Dulcolax suppository -IVF -PRN pain control and antiemetics -Appreciate hospitalist recommendations   LOS: 4 days    Ryli Standlee A Krist Rosenboom 01/01/2024  Note: Portions of this report may have been transcribed using voice recognition software. Every effort has been made to ensure accuracy; however, inadvertent computerized transcription errors may still be present.

## 2024-01-01 NOTE — Progress Notes (Signed)
 Physical Therapy Treatment Patient Details Name: Kevin [name] MRN: [id number] DOB: Sep 14, 1945 Today's Date: 01/01/2024   History of Present Illness Kevin [name] is a 78 year old male with a history of coronary artery disease status post LAD and circumflex stenting 2001 and 2004, 02/14/2015 revealed diffuse LAD in-stent restenosis which was restented, diabetes mellitus type 2, hypertension, hyperlipidemia, prostate cancer, hypothyroidism, SBO from volvulus presenting (2023) with 1 day history of abdominal pain with nausea and vomiting.  The patient states that he had been off of his Mounjaro for about 2 months because of his prostate cancer treatments.  He restarted it on 12/26/2023.  On the morning of 12/27/2023, the patient began developing abdominal pain.  This worsened and he began developing nausea and vomiting and dry heaving.  As result, he presented for further evaluation and treatment.  He denies any other new medications.  He denies any fevers, chills, chest pain, cough, hemoptysis, hematochezia, melena, dysuria, hematuria.     In the ED, the patient was afebrile hemodynamically stable with oxygen saturation 1 9% room air.  WBC 5.9, hemoglobin 9.9, platelets 121.  Sodium 130, potassium 4.4, bicarbonate 24, serum creatinine 1.67.  LFTs unremarkable.  CT abdomen pelvis showed mesenteric edema adjacent to dilated small bowel loops.  There was a transition point in the small bowel in the midabdomen.  There is a large ventral hernia with dilated small bowel loops, but the ventral hernia does not appear to be cause of SBO.  General surgery was consulted to assist with management.  NG was placed for decompression.    PT Comments  Patient functioning near baseline for functional mobility and gait demonstrating modI for bed mobility but does require supervision for standing and ambulation task due to slight balance deficits noted in standing. Patient able to tolerate long distance ambulation without AD with  no increase in symptoms, but limited by chronic BIL knee pain. Patient with mild unsteadiness and wide base of support likely resulting from knee pain. Patient discharged to care of nursing for ambulation daily as tolerated for length of stay.     If plan is discharge home, recommend the following: A little help with walking and/or transfers;A little help with bathing/dressing/bathroom;Assist for transportation   Can travel by private vehicle        Equipment Recommendations  None recommended by PT    Recommendations for Other Services       Precautions / Restrictions Precautions Precautions: Fall Recall of Precautions/Restrictions: Intact Restrictions Weight Bearing Restrictions Per Provider Order: No     Mobility  Bed Mobility Overal bed mobility: Needs Assistance Bed Mobility: Supine to Sit     Supine to sit: Modified independent (Device/Increase time), HOB elevated     General bed mobility comments: HOB elevated, no physical assist, inc time due to slow labored movement    Transfers Overall transfer level: Needs assistance Equipment used: None Transfers: Sit to/from Stand Sit to Stand: Supervision           General transfer comment: STS from EOB, mild unsteadiness with WBOS    Ambulation/Gait Ambulation/Gait assistance: Supervision Gait Distance (Feet): 100 Feet Assistive device: None Gait Pattern/deviations: Decreased step length - right, Decreased step length - left, Wide base of support Gait velocity: Dec     General Gait Details: Pt demonstrates good return for ambulation in room and hallway without LOB. Limited mostly to BIL knee pain   Stairs             Wheelchair Mobility  Tilt Bed    Modified Rankin (Stroke Patients Only)       Balance Overall balance assessment: Needs assistance Sitting-balance support: No upper extremity supported, Feet supported Sitting balance-Leahy Scale: Good Sitting balance - Comments: Seated EOB    Standing balance support: No upper extremity supported, During functional activity Standing balance-Leahy Scale: Good Standing balance comment: without RW                            Communication Communication Communication: No apparent difficulties  Cognition Arousal: Alert Behavior During Therapy: WFL for tasks assessed/performed   PT - Cognitive impairments: No apparent impairments                         Following commands: Intact      Cueing Cueing Techniques: Verbal cues, Visual cues  Exercises      General Comments        Pertinent Vitals/Pain Pain Assessment Pain Assessment: Faces Faces Pain Scale: Hurts a little bit Pain Location: Abdomen Pain Descriptors / Indicators: Discomfort Pain Intervention(s): Monitored during session    Home Living                          Prior Function            PT Goals (current goals can now be found in the care plan section) Acute Rehab PT Goals Patient Stated Goal: return to ILF with [facility] services PT Goal Formulation: With patient Time For Goal Achievement: 01/01/24 Potential to Achieve Goals: Good Progress towards PT goals: Goals met/education completed, patient discharged from PT    Frequency           PT Plan      Co-evaluation              AM-PAC PT 6 Clicks Mobility   Outcome Measure  Help needed turning from your back to your side while in a flat bed without using bedrails?: None Help needed moving from lying on your back to sitting on the side of a flat bed without using bedrails?: None Help needed moving to and from a bed to a chair (including a wheelchair)?: A Little Help needed standing up from a chair using your arms (e.g., wheelchair or bedside chair)?: A Little Help needed to walk in hospital room?: A Little Help needed climbing 3-5 steps with a railing? : A Lot 6 Click Score: 19    End of Session Equipment Utilized During Treatment: Gait belt Activity  Tolerance: Patient tolerated treatment well;Patient limited by pain Patient left: in chair;with call bell/phone within reach Nurse Communication: Mobility status PT Visit Diagnosis: Other abnormalities of gait and mobility (R26.89);Muscle weakness (generalized) (M62.81);Unsteadiness on feet (R26.81) Pain - part of body:  (abdomen)     Time: 9048-8985 PT Time Calculation (min) (ACUTE ONLY): 23 min  Charges:    $Therapeutic Activity: 23-37 mins PT General Charges $$ ACUTE PT VISIT: 1 Visit                     12:11 PM, 01/01/24,  Andie Mortimer, SPT

## 2024-01-01 NOTE — Progress Notes (Signed)
 PROGRESS NOTE    Kevin [name]  FMW:[id number] DOB: 06-06-1945 DOA: 12/28/2023 PCP: Marvine Rush, MD   Brief Narrative:    78 year old male with a history of coronary artery disease status post LAD and circumflex stenting 2001 and 2004, 02/14/2015 revealed diffuse LAD in-stent restenosis which was restented, diabetes mellitus type 2, hypertension, hyperlipidemia, prostate cancer, hypothyroidism, SBO from volvulus presenting (2023) with 1 day history of abdominal pain with nausea and vomiting. The patient states that he had been off of his Mounjaro for about 2 months because of his prostate cancer treatments. He restarted it on 12/26/2023. On the morning of 12/27/2023, the patient began developing abdominal pain.  Patient was admitted for small bowel obstruction on 8/25 and underwent ex lap with LOA and primary repair of ventral hernia on that same day.  Assessment & Plan:   Principal Problem:   Small bowel obstruction (HCC) Active Problems:   Coronary artery disease, hx LAD and LCX stenting in 2001 & 2004, last cath 2009 with patent stents.   Essential hypertension   Obesity (BMI 30-39.9)   Type 2 diabetes mellitus with hyperglycemia (HCC)   Mixed hyperlipidemia   Chronic kidney disease, stage 3a (HCC)   Ventral hernia without obstruction or gangrene   Intra-abdominal adhesions  Assessment and Plan:   Small bowel obstruction -General Surgery consult appreciated - 12/28/23--Ex lap with LOA and primary repair of ventral hernia  - NG tube was almost coming out on 8/27 and it appears that it has been removed - Judicious opioids - NG tube replaced on 8/28 due to persistent pain with nausea and vomiting with KUB demonstrating distended loops of small bowel.   Essential hypertension - Holding metoprolol  and ARB, but will order hydralazine  as needed for significant elevations   AKI on CKD stage IIIa-improving - Baseline creatinine 1.2-1.4 -Maintained on some gentle IV fluid, restart  today until diet advanced - Monitor BMP   Coronary artery disease - History of LAD and Lcx stents 2001 and 2004 - Restented LAD 2016 - No chest pain presently - personally reviewed EKG--sinus, no STT changes - Holding aspirin  and Plavix  temporarily>>restart when ok with general surgery   Mixed hyperlipidemia - Hold statin   Diabetes mellitus type 2 with hyperglycemia - NovoLog  sliding scale - 06/30/2023 hemoglobin A1c--6.4 - NovoLog  scale   Prostate cancer - Patient finished radiation approximately 2 weeks prior to admission   Obesity, class I - BMI 33.48 - Lifestyle modification   Hypothyroidism - Hold Synthroid     DVT prophylaxis:Heparin  Code Status: Full Family Communication: None at bedside Disposition Plan:  Status is: Inpatient Remains inpatient appropriate because: Need for IV fluid   Consultants:  General surgery  Procedures:  8/25-ex lap with LOA and primary repair of ventral hernia  Antimicrobials:  Anti-infectives (From admission, onward)    Start     Dose/Rate Route Frequency Ordered Stop   12/28/23 1530  ciprofloxacin  (CIPRO ) IVPB 400 mg        400 mg 200 mL/hr over 60 Minutes Intravenous On call to O.R. 12/28/23 1518 12/28/23 1619   12/28/23 1530  metroNIDAZOLE  (FLAGYL ) IVPB 500 mg        500 mg 100 mL/hr over 60 Minutes Intravenous On call to O.R. 12/28/23 1518 12/28/23 1640       Subjective: Patient seen and evaluated and states that he is overall feeling much better with less pain and no nausea and vomiting.  He is motivated to ambulate more and has been passing flatus  overnight.  NG tube had to be replaced on 8/28.  Objective: Vitals:   12/31/23 1733 12/31/23 2007 12/31/23 2330 01/01/24 0404  BP: (!) 158/80 (!) 155/80 (!) 142/82 126/68  Pulse: (!) 113 97 87 85  Resp: 19 18 18 18   Temp: 98.2 F (36.8 C) 98.6 F (37 C) 98.4 F (36.9 C) 97.8 F (36.6 C)  TempSrc: Oral Oral Oral   SpO2: 100% 98% 99% 95%  Weight:      Height:         Intake/Output Summary (Last 24 hours) at 01/01/2024 1020 Last data filed at 01/01/2024 0400 Gross per 24 hour  Intake 0 ml  Output 2550 ml  Net -2550 ml   Filed Weights   12/28/23 0714 12/28/23 1254 12/28/23 1531  Weight: 104.3 kg 107.7 kg 104.3 kg    Examination:  General exam: Appears calm and comfortable  Respiratory system: Clear to auscultation. Respiratory effort normal. Cardiovascular system: S1 & S2 heard, RRR.  Gastrointestinal system: Abdomen is soft, binder present, NG tube to low intermittent suction Central nervous system: Alert and awake Extremities: No edema Skin: No significant lesions noted Psychiatry: Flat affect.    Data Reviewed: I have personally reviewed following labs and imaging studies  CBC: Recent Labs  Lab 12/28/23 0755 12/29/23 0444 12/30/23 0435 12/31/23 0430 01/01/24 0402  WBC 5.9 4.2 3.9* 2.8* 3.7*  HGB 9.9* 9.6* 9.0* 8.2* 9.0*  HCT 29.8* 29.1* 27.0* 25.2* 27.4*  MCV 97.7 98.3 100.4* 101.2* 102.6*  PLT 121* 130* 124* 134* 164   Basic Metabolic Panel: Recent Labs  Lab 12/28/23 0755 12/29/23 0444 12/30/23 0435 12/31/23 0430 01/01/24 0402  NA 130* 131* 135 135 140  K 4.4 4.4 4.3 3.8 4.1  CL 94* 99 102 103 101  CO2 24 23 24 25 28   GLUCOSE 148* 130* 115* 91 99  BUN 29* 26* 30* 29* 24*  CREATININE 1.67* 1.44* 1.87* 1.58* 1.43*  CALCIUM 9.8 8.8* 8.8* 8.9 9.5  MG  --   --  1.7 1.8 1.9  PHOS  --   --  3.5  --   --    GFR: Estimated Creatinine Clearance: 51.9 mL/min (A) (by C-G formula based on SCr of 1.43 mg/dL (H)). Liver Function Tests: Recent Labs  Lab 12/28/23 0755 01/01/24 0402  AST 13* 12*  ALT 12 11  ALKPHOS 46 35*  BILITOT 1.1 0.8  PROT 6.8 5.8*  ALBUMIN  3.9 3.1*   Recent Labs  Lab 12/28/23 0755  LIPASE 27   No results for input(s): AMMONIA in the last 168 hours. Coagulation Profile: No results for input(s): INR, PROTIME in the last 168 hours. Cardiac Enzymes: No results for input(s): CKTOTAL,  CKMB, CKMBINDEX, TROPONINI in the last 168 hours. BNP (last 3 results) No results for input(s): PROBNP in the last 8760 hours. HbA1C: No results for input(s): HGBA1C in the last 72 hours.  CBG: Recent Labs  Lab 12/31/23 0742 12/31/23 1114 12/31/23 1647 12/31/23 2149 01/01/24 0718  GLUCAP 91 106* 115* 110* 92   Lipid Profile: No results for input(s): CHOL, HDL, LDLCALC, TRIG, CHOLHDL, LDLDIRECT in the last 72 hours. Thyroid  Function Tests: No results for input(s): TSH, T4TOTAL, FREET4, T3FREE, THYROIDAB in the last 72 hours. Anemia Panel: No results for input(s): VITAMINB12, FOLATE, FERRITIN, TIBC, IRON, RETICCTPCT in the last 72 hours. Sepsis Labs: Recent Labs  Lab 12/28/23 1111 12/28/23 1239  LATICACIDVEN 1.0 1.2    No results found for this or any previous visit (from the  past 240 hours).       Radiology Studies: DG Abd 1 View Result Date: 01/01/2024 EXAM: 1 VIEW XRAY OF THE ABDOMEN 01/01/2024 08:47:46 AM COMPARISON: 12/31/2023 abdominal radiograph CLINICAL HISTORY: Postoperative ileus, small bowel obstruction. FINDINGS: BOWEL: Generalized mild-to-moderate small bowel dilatation has improved. Mild-to-moderate diffuse colonic stool. No pneumatosis or pneumoperitoneum. SOFT TISSUES: Vertical skin staples overall the abdomen. Fiducial markers overlie the region of the prostate. Enteric tube terminates over the gastric fundus with side port just below the esophagus gastric junction. BONES: Moderate lumbar spondylosis. No acute osseous abnormality. IMPRESSION: 1. Improving postoperative ileus. Electronically signed by: Selinda Blue MD 01/01/2024 09:54 AM EDT RP Workstation: [id number]   DG Abd Portable 1V Result Date: 12/31/2023 EXAM: 1 VIEW XRAY OF THE ABDOMEN 12/31/2023 04:16:00 PM COMPARISON: 12/31/2023 CLINICAL HISTORY: S/p NGT placement today, hx sbo FINDINGS: BOWEL: Persistent gaseous distention of small bowel is stable. SOFT  TISSUES: No opaque urinary calculi. BONES: No acute osseous abnormality. LINES AND TUBES: NG tube in place with tip in proximal stomach. Side port near GE junction. Consider advancing several centimeters. IMPRESSION: 1. NG tube in place with tip in proximal stomach and side port near GE junction. Consider advancing several centimeters. 2. Distended stomach with air. 3. Persistent gaseous distention of small bowel, stable. Electronically signed by: Lonni Necessary MD 12/31/2023 04:24 PM EDT RP Workstation: [id number]   DG Abd 1 View Result Date: 12/31/2023 CLINICAL DATA:  Postoperative ileus and nausea. EXAM: ABDOMEN - 1 VIEW COMPARISON:  Radiograph dated 12/28/2023. FINDINGS: Interval removal of the enteric tube. There is moderate gaseous distension of the stomach. There is air distended loops of small bowel measuring up to 4.7 cm in caliber. Stool and air noted in the colon. Midline vertical anterior abdominal wall cutaneous staples noted. There is degenerative changes of the spine and hips. No acute osseous pathology. Penile implant. IMPRESSION: Findings favor to represent an ileus. Developing obstruction is not excluded. Clinical correlation and follow-up recommended. Electronically Signed   By: Vanetta Chou M.D.   On: 12/31/2023 13:23         Scheduled Meds:  bisacodyl   10 mg Rectal Daily   Chlorhexidine  Gluconate Cloth  6 each Topical Q0600   heparin   5,000 Units Subcutaneous Q8H   irbesartan   150 mg Oral Daily   And   hydrochlorothiazide   12.5 mg Oral Daily   insulin  aspart  0-5 Units Subcutaneous QHS   insulin  aspart  0-6 Units Subcutaneous TID WC   levothyroxine   50 mcg Oral QAC breakfast   metoprolol  succinate  100 mg Oral Daily   And   metoprolol  succinate  50 mg Oral QHS   pravastatin   40 mg Oral QPM   tamsulosin   0.4 mg Oral QPM      LOS: 4 days    Time spent: 55 minutes    Jamita Mckelvin JONETTA Fairly, DO [facility]  If 7PM-7AM, please contact  night-coverage [email or web address] 01/01/2024, 10:20 AM

## 2024-01-01 NOTE — Progress Notes (Signed)
 Patient has been passing gas throughout the night.  He has gotten up to set on the toilet multiple times.

## 2024-01-01 NOTE — Plan of Care (Signed)
   Problem: Education: Goal: Knowledge of General Education information will improve Description Including pain rating scale, medication(s)/side effects and non-pharmacologic comfort measures Outcome: Progressing   Problem: Health Behavior/Discharge Planning: Goal: Ability to manage health-related needs will improve Outcome: Progressing

## 2024-01-01 NOTE — Progress Notes (Signed)
 Mobility Specialist Progress Note:    01/01/24 1240  Mobility  Activity Ambulated with assistance  Level of Assistance Contact guard assist, steadying assist  Assistive Device None  Distance Ambulated (ft) 220 ft  Range of Motion/Exercises Active;All extremities  Activity Response Tolerated well  Mobility Referral Yes  Mobility visit 1 Mobility  Mobility Specialist Start Time (ACUTE ONLY) 1240  Mobility Specialist Stop Time (ACUTE ONLY) 1300  Mobility Specialist Time Calculation (min) (ACUTE ONLY) 20 min   Pt received in chair, agreeable to mobility. Required CGA to stand and ambulate with no AD. Tolerated well,asx throughout. Left pt supine, all needs met.  Jazleen Robeck Mobility Specialist Please contact via Special educational needs teacher or  Rehab office at [phone]

## 2024-01-02 DIAGNOSIS — K56609 Unspecified intestinal obstruction, unspecified as to partial versus complete obstruction: Secondary | ICD-10-CM | POA: Diagnosis not present

## 2024-01-02 LAB — COMPREHENSIVE METABOLIC PANEL WITH GFR
ALT: 14 U/L (ref 0–44)
AST: 16 U/L (ref 15–41)
Albumin: 3.2 g/dL — ABNORMAL LOW (ref 3.5–5.0)
Alkaline Phosphatase: 35 U/L — ABNORMAL LOW (ref 38–126)
Anion gap: 11 (ref 5–15)
BUN: 24 mg/dL — ABNORMAL HIGH (ref 8–23)
CO2: 29 mmol/L (ref 22–32)
Calcium: 9.7 mg/dL (ref 8.9–10.3)
Chloride: 104 mmol/L (ref 98–111)
Creatinine, Ser: 1.44 mg/dL — ABNORMAL HIGH (ref 0.61–1.24)
GFR, Estimated: 50 mL/min — ABNORMAL LOW (ref >60)
Glucose, Bld: 81 mg/dL (ref 70–99)
Potassium: 3.5 mmol/L (ref 3.5–5.1)
Sodium: 144 mmol/L (ref 135–145)
Total Bilirubin: 1 mg/dL (ref 0.0–1.2)
Total Protein: 5.8 g/dL — ABNORMAL LOW (ref 6.5–8.1)

## 2024-01-02 LAB — GLUCOSE, CAPILLARY
Glucose-Capillary: 81 mg/dL (ref 70–99)
Glucose-Capillary: 79 mg/dL (ref 70–99)
Glucose-Capillary: 106 mg/dL — ABNORMAL HIGH (ref 70–99)
Glucose-Capillary: 102 mg/dL — ABNORMAL HIGH (ref 70–99)

## 2024-01-02 LAB — CBC
HCT: 26.5 % — ABNORMAL LOW (ref 39.0–52.0)
Hemoglobin: 8.6 g/dL — ABNORMAL LOW (ref 13.0–17.0)
MCH: 33.2 pg (ref 26.0–34.0)
MCHC: 32.5 g/dL (ref 30.0–36.0)
MCV: 102.3 fL — ABNORMAL HIGH (ref 80.0–100.0)
Platelets: 175 K/uL (ref 150–400)
RBC: 2.59 MIL/uL — ABNORMAL LOW (ref 4.22–5.81)
RDW: 13.7 % (ref 11.5–15.5)
WBC: 3.5 K/uL — ABNORMAL LOW (ref 4.0–10.5)
nRBC: 0 % (ref 0.0–0.2)

## 2024-01-02 LAB — MAGNESIUM: Magnesium: 1.8 mg/dL (ref 1.7–2.4)

## 2024-01-02 NOTE — Plan of Care (Signed)

## 2024-01-02 NOTE — Progress Notes (Signed)
 NG tube clamped but remains in place at this time. Patient given clear liquids and advised to go slow with them, he verbalized understanding. This nurse instructed patient to notify staff of any new abd pain, nausea, or vomiting and he verbalized understanding. After consuming liquids, he currently has no c/o n/v or pain. Ginger ale given per request to sip on. Call bell in reach.

## 2024-01-02 NOTE — Progress Notes (Signed)
 PROGRESS NOTE    Kevin [name]  FMW:[id number] DOB: 03-14-1946 DOA: 12/28/2023 PCP: Marvine Rush, MD   Brief Narrative:    78 year old male with a history of coronary artery disease status post LAD and circumflex stenting 2001 and 2004, 02/14/2015 revealed diffuse LAD in-stent restenosis which was restented, diabetes mellitus type 2, hypertension, hyperlipidemia, prostate cancer, hypothyroidism, SBO from volvulus presenting (2023) with 1 day history of abdominal pain with nausea and vomiting. The patient states that he had been off of his Mounjaro for about 2 months because of his prostate cancer treatments. He restarted it on 12/26/2023. On the morning of 12/27/2023, the patient began developing abdominal pain.  Patient was admitted for small bowel obstruction on 8/25 and underwent ex lap with LOA and primary repair of ventral hernia on that same day.  Assessment & Plan:   Principal Problem:   Small bowel obstruction (HCC) Active Problems:   Coronary artery disease, hx LAD and LCX stenting in 2001 & 2004, last cath 2009 with patent stents.   Essential hypertension   Obesity (BMI 30-39.9)   Type 2 diabetes mellitus with hyperglycemia (HCC)   Mixed hyperlipidemia   Chronic kidney disease, stage 3a (HCC)   Ventral hernia without obstruction or gangrene   Intra-abdominal adhesions  Assessment and Plan:   Small bowel obstruction -General Surgery consult appreciated - 12/28/23--Ex lap with LOA and primary repair of ventral hernia  - NG tube was almost coming out on 8/27 and it appears that it has been removed - Judicious opioids - NG tube replaced on 8/28, but overall doing better today and hoping for dietary advancement and NG tube removal -Continue very gentle IV fluid   Essential hypertension - Holding metoprolol  and ARB, but will order hydralazine  as needed for significant elevations   AKI on CKD stage IIIa-improving - Baseline creatinine 1.2-1.4 -Maintained on some gentle IV  fluid, restart today until diet advanced - Monitor BMP   Coronary artery disease - History of LAD and Lcx stents 2001 and 2004 - Restented LAD 2016 - No chest pain presently - personally reviewed EKG--sinus, no STT changes - Holding aspirin  and Plavix  temporarily>>restart when ok with general surgery   Mixed hyperlipidemia - Hold statin   Diabetes mellitus type 2 with hyperglycemia - NovoLog  sliding scale - 06/30/2023 hemoglobin A1c--6.4 - NovoLog  scale   Prostate cancer - Patient finished radiation approximately 2 weeks prior to admission   Obesity, class I - BMI 33.48 - Lifestyle modification   Hypothyroidism - Hold Synthroid     DVT prophylaxis:Heparin  Code Status: Full Family Communication: None at bedside Disposition Plan:  Status is: Inpatient Remains inpatient appropriate because: Need for IV fluid   Consultants:  General surgery  Procedures:  8/25-ex lap with LOA and primary repair of ventral hernia  Antimicrobials:  Anti-infectives (From admission, onward)    Start     Dose/Rate Route Frequency Ordered Stop   12/28/23 1530  ciprofloxacin  (CIPRO ) IVPB 400 mg        400 mg 200 mL/hr over 60 Minutes Intravenous On call to O.R. 12/28/23 1518 12/28/23 1619   12/28/23 1530  metroNIDAZOLE  (FLAGYL ) IVPB 500 mg        500 mg 100 mL/hr over 60 Minutes Intravenous On call to O.R. 12/28/23 1518 12/28/23 1640       Subjective: Patient seen and evaluated and states that he is overall feeling much better with less pain and no nausea and vomiting. He has not had any bowel movements and is  passing some flatus.  Objective: Vitals:   01/01/24 1926 01/01/24 2130 01/02/24 0418 01/02/24 0939  BP: (!) 154/67 (!) 154/67 (!) 148/70 (!) 154/64  Pulse: 82 82 85 77  Resp: 16  14   Temp: 99 F (37.2 C)  97.8 F (36.6 C)   TempSrc: Oral  Oral   SpO2: 97%  94%   Weight:      Height:        Intake/Output Summary (Last 24 hours) at 01/02/2024 1012 Last data filed at  01/02/2024 0600 Gross per 24 hour  Intake 1327.06 ml  Output 1650 ml  Net -322.94 ml   Filed Weights   12/28/23 0714 12/28/23 1254 12/28/23 1531  Weight: 104.3 kg 107.7 kg 104.3 kg    Examination:  General exam: Appears calm and comfortable  Respiratory system: Clear to auscultation. Respiratory effort normal. Cardiovascular system: S1 & S2 heard, RRR.  Gastrointestinal system: Abdomen is soft, binder present, NG tube currently clamped Central nervous system: Alert and awake Extremities: No edema Skin: No significant lesions noted Psychiatry: Flat affect.    Data Reviewed: I have personally reviewed following labs and imaging studies  CBC: Recent Labs  Lab 12/29/23 0444 12/30/23 0435 12/31/23 0430 01/01/24 0402 01/02/24 0443  WBC 4.2 3.9* 2.8* 3.7* 3.5*  HGB 9.6* 9.0* 8.2* 9.0* 8.6*  HCT 29.1* 27.0* 25.2* 27.4* 26.5*  MCV 98.3 100.4* 101.2* 102.6* 102.3*  PLT 130* 124* 134* 164 175   Basic Metabolic Panel: Recent Labs  Lab 12/29/23 0444 12/30/23 0435 12/31/23 0430 01/01/24 0402 01/02/24 0443  NA 131* 135 135 140 144  K 4.4 4.3 3.8 4.1 3.5  CL 99 102 103 101 104  CO2 23 24 25 28 29   GLUCOSE 130* 115* 91 99 81  BUN 26* 30* 29* 24* 24*  CREATININE 1.44* 1.87* 1.58* 1.43* 1.44*  CALCIUM 8.8* 8.8* 8.9 9.5 9.7  MG  --  1.7 1.8 1.9 1.8  PHOS  --  3.5  --   --   --    GFR: Estimated Creatinine Clearance: 51.6 mL/min (A) (by C-G formula based on SCr of 1.44 mg/dL (H)). Liver Function Tests: Recent Labs  Lab 12/28/23 0755 01/01/24 0402 01/02/24 0443  AST 13* 12* 16  ALT 12 11 14   ALKPHOS 46 35* 35*  BILITOT 1.1 0.8 1.0  PROT 6.8 5.8* 5.8*  ALBUMIN  3.9 3.1* 3.2*   Recent Labs  Lab 12/28/23 0755  LIPASE 27   No results for input(s): AMMONIA in the last 168 hours. Coagulation Profile: No results for input(s): INR, PROTIME in the last 168 hours. Cardiac Enzymes: No results for input(s): CKTOTAL, CKMB, CKMBINDEX, TROPONINI in the last  168 hours. BNP (last 3 results) No results for input(s): PROBNP in the last 8760 hours. HbA1C: No results for input(s): HGBA1C in the last 72 hours.  CBG: Recent Labs  Lab 01/01/24 0718 01/01/24 1101 01/01/24 1608 01/01/24 2022 01/02/24 0728  GLUCAP 92 103* 83 94 81   Lipid Profile: No results for input(s): CHOL, HDL, LDLCALC, TRIG, CHOLHDL, LDLDIRECT in the last 72 hours. Thyroid  Function Tests: No results for input(s): TSH, T4TOTAL, FREET4, T3FREE, THYROIDAB in the last 72 hours. Anemia Panel: No results for input(s): VITAMINB12, FOLATE, FERRITIN, TIBC, IRON, RETICCTPCT in the last 72 hours. Sepsis Labs: Recent Labs  Lab 12/28/23 1111 12/28/23 1239  LATICACIDVEN 1.0 1.2    No results found for this or any previous visit (from the past 240 hours).  Radiology Studies: DG Abd 1 View Result Date: 01/01/2024 EXAM: 1 VIEW XRAY OF THE ABDOMEN 01/01/2024 08:47:46 AM COMPARISON: 12/31/2023 abdominal radiograph CLINICAL HISTORY: Postoperative ileus, small bowel obstruction. FINDINGS: BOWEL: Generalized mild-to-moderate small bowel dilatation has improved. Mild-to-moderate diffuse colonic stool. No pneumatosis or pneumoperitoneum. SOFT TISSUES: Vertical skin staples overall the abdomen. Fiducial markers overlie the region of the prostate. Enteric tube terminates over the gastric fundus with side port just below the esophagus gastric junction. BONES: Moderate lumbar spondylosis. No acute osseous abnormality. IMPRESSION: 1. Improving postoperative ileus. Electronically signed by: Selinda Blue MD 01/01/2024 09:54 AM EDT RP Workstation: [id number]   DG Abd Portable 1V Result Date: 12/31/2023 EXAM: 1 VIEW XRAY OF THE ABDOMEN 12/31/2023 04:16:00 PM COMPARISON: 12/31/2023 CLINICAL HISTORY: S/p NGT placement today, hx sbo FINDINGS: BOWEL: Persistent gaseous distention of small bowel is stable. SOFT TISSUES: No opaque urinary calculi. BONES: No acute  osseous abnormality. LINES AND TUBES: NG tube in place with tip in proximal stomach. Side port near GE junction. Consider advancing several centimeters. IMPRESSION: 1. NG tube in place with tip in proximal stomach and side port near GE junction. Consider advancing several centimeters. 2. Distended stomach with air. 3. Persistent gaseous distention of small bowel, stable. Electronically signed by: Lonni Necessary MD 12/31/2023 04:24 PM EDT RP Workstation: [id number]   DG Abd 1 View Result Date: 12/31/2023 CLINICAL DATA:  Postoperative ileus and nausea. EXAM: ABDOMEN - 1 VIEW COMPARISON:  Radiograph dated 12/28/2023. FINDINGS: Interval removal of the enteric tube. There is moderate gaseous distension of the stomach. There is air distended loops of small bowel measuring up to 4.7 cm in caliber. Stool and air noted in the colon. Midline vertical anterior abdominal wall cutaneous staples noted. There is degenerative changes of the spine and hips. No acute osseous pathology. Penile implant. IMPRESSION: Findings favor to represent an ileus. Developing obstruction is not excluded. Clinical correlation and follow-up recommended. Electronically Signed   By: Vanetta Chou M.D.   On: 12/31/2023 13:23         Scheduled Meds:  bisacodyl   10 mg Rectal Daily   heparin   5,000 Units Subcutaneous Q8H   insulin  aspart  0-5 Units Subcutaneous QHS   insulin  aspart  0-6 Units Subcutaneous TID WC   levothyroxine   50 mcg Oral QAC breakfast   metoprolol  succinate  100 mg Oral Daily   And   metoprolol  succinate  50 mg Oral QHS   pravastatin   40 mg Oral QPM   tamsulosin   0.4 mg Oral QPM      LOS: 5 days    Time spent: 55 minutes    Yarrow Linhart JONETTA Fairly, DO [facility]  If 7PM-7AM, please contact night-coverage [email or web address] 01/02/2024, 10:12 AM

## 2024-01-02 NOTE — Progress Notes (Signed)
 [facility] Progress Note  5 Days Post-Op  Subjective: Patient seen and examined.  He is resting comfortably in bed.  He states that he is still feeling very well.  He has passed some flatus and had a small bowel movement after suppository.  His NG tube remains in place with 1.6 L of gastric contents but output in the last 24 hours.  Objective: Vital signs in last 24 hours: Temp:  [97.8 F (36.6 C)-99 F (37.2 C)] 97.8 F (36.6 C) (08/30 0418) Pulse Rate:  [77-93] 77 (08/30 0939) Resp:  [14-19] 14 (08/30 0418) BP: (143-154)/(64-78) 154/64 (08/30 0939) SpO2:  [94 %-98 %] 94 % (08/30 0418) Last BM Date : 12/29/23  Intake/Output from previous day: 08/29 0701 - 08/30 0700 In: 1327.1 [I.V.:1327.1] Out: 1650 [Emesis/NG output:1650] Intake/Output this shift: Total I/O In: 30 [NG/GT:30] Out: -   General appearance: alert, cooperative, and no distress Nose: NG tube in place with gastric contents in canister GI: Soft, mild distention, no percussion tenderness, nontender to palpation; no rigidity, guarding, rebound tenderness; midline incision C/D/I with skin staples in place  Lab Results:  Recent Labs    01/01/24 0402 01/02/24 0443  WBC 3.7* 3.5*  HGB 9.0* 8.6*  HCT 27.4* 26.5*  PLT 164 175   BMET Recent Labs    01/01/24 0402 01/02/24 0443  NA 140 144  K 4.1 3.5  CL 101 104  CO2 28 29  GLUCOSE 99 81  BUN 24* 24*  CREATININE 1.43* 1.44*  CALCIUM 9.5 9.7   PT/INR No results for input(s): LABPROT, INR in the last 72 hours.  Studies/Results: DG Abd 1 View Result Date: 01/01/2024 EXAM: 1 VIEW XRAY OF THE ABDOMEN 01/01/2024 08:47:46 AM COMPARISON: 12/31/2023 abdominal radiograph CLINICAL HISTORY: Postoperative ileus, small bowel obstruction. FINDINGS: BOWEL: Generalized mild-to-moderate small bowel dilatation has improved. Mild-to-moderate diffuse colonic stool. No pneumatosis or pneumoperitoneum. SOFT TISSUES: Vertical skin staples overall the  abdomen. Fiducial markers overlie the region of the prostate. Enteric tube terminates over the gastric fundus with side port just below the esophagus gastric junction. BONES: Moderate lumbar spondylosis. No acute osseous abnormality. IMPRESSION: 1. Improving postoperative ileus. Electronically signed by: Selinda Blue MD 01/01/2024 09:54 AM EDT RP Workstation: [id number]   DG Abd Portable 1V Result Date: 12/31/2023 EXAM: 1 VIEW XRAY OF THE ABDOMEN 12/31/2023 04:16:00 PM COMPARISON: 12/31/2023 CLINICAL HISTORY: S/p NGT placement today, hx sbo FINDINGS: BOWEL: Persistent gaseous distention of small bowel is stable. SOFT TISSUES: No opaque urinary calculi. BONES: No acute osseous abnormality. LINES AND TUBES: NG tube in place with tip in proximal stomach. Side port near GE junction. Consider advancing several centimeters. IMPRESSION: 1. NG tube in place with tip in proximal stomach and side port near GE junction. Consider advancing several centimeters. 2. Distended stomach with air. 3. Persistent gaseous distention of small bowel, stable. Electronically signed by: Lonni Necessary MD 12/31/2023 04:24 PM EDT RP Workstation: [id number]    Anti-infectives: Anti-infectives (From admission, onward)    Start     Dose/Rate Route Frequency Ordered Stop   12/28/23 1530  ciprofloxacin  (CIPRO ) IVPB 400 mg        400 mg 200 mL/hr over 60 Minutes Intravenous On call to O.R. 12/28/23 1518 12/28/23 1619   12/28/23 1530  metroNIDAZOLE  (FLAGYL ) IVPB 500 mg        500 mg 100 mL/hr over 60 Minutes Intravenous On call to O.R. 12/28/23 1518 12/28/23 1640       Assessment/Plan:  Patient is a 78 year old  male who was admitted with small bowel obstruction and concern for possible closed-loop pathology.  He is status post exploratory laparotomy, lysis of adhesions, and primary repair of ventral hernia on 8/25.   -Patient with continued improved abdominal distention -Will clamp NG tube today and trial clear liquids -If  patient tolerates clears today without nausea and vomiting and continues to have bowel function, will plan for NG tube removal tomorrow morning -Patient without leukocytosis today. -Hemoglobin stable -Monitor for bowel function -Continue Dulcolax suppository -IVF -PRN pain control and antiemetics -Appreciate hospitalist recommendations   LOS: 5 days    Daeshon Grammatico A Diva Lemberger 01/02/2024  Note: Portions of this report may have been transcribed using voice recognition software. Every effort has been made to ensure accuracy; however, inadvertent computerized transcription errors may still be present.

## 2024-01-03 ENCOUNTER — Inpatient Hospital Stay (HOSPITAL_COMMUNITY)

## 2024-01-03 DIAGNOSIS — Z4682 Encounter for fitting and adjustment of non-vascular catheter: Secondary | ICD-10-CM | POA: Diagnosis not present

## 2024-01-03 DIAGNOSIS — K567 Ileus, unspecified: Secondary | ICD-10-CM | POA: Diagnosis not present

## 2024-01-03 DIAGNOSIS — K56609 Unspecified intestinal obstruction, unspecified as to partial versus complete obstruction: Secondary | ICD-10-CM | POA: Diagnosis not present

## 2024-01-03 LAB — BASIC METABOLIC PANEL WITH GFR
Anion gap: 6 (ref 5–15)
BUN: 21 mg/dL (ref 8–23)
CO2: 32 mmol/L (ref 22–32)
Calcium: 9.5 mg/dL (ref 8.9–10.3)
Chloride: 105 mmol/L (ref 98–111)
Creatinine, Ser: 1.31 mg/dL — ABNORMAL HIGH (ref 0.61–1.24)
GFR, Estimated: 56 mL/min — ABNORMAL LOW (ref >60)
Glucose, Bld: 100 mg/dL — ABNORMAL HIGH (ref 70–99)
Potassium: 3.5 mmol/L (ref 3.5–5.1)
Sodium: 143 mmol/L (ref 135–145)

## 2024-01-03 LAB — CBC
HCT: 27.4 % — ABNORMAL LOW (ref 39.0–52.0)
Hemoglobin: 8.9 g/dL — ABNORMAL LOW (ref 13.0–17.0)
MCH: 33 pg (ref 26.0–34.0)
MCHC: 32.5 g/dL (ref 30.0–36.0)
MCV: 101.5 fL — ABNORMAL HIGH (ref 80.0–100.0)
Platelets: 172 K/uL (ref 150–400)
RBC: 2.7 MIL/uL — ABNORMAL LOW (ref 4.22–5.81)
RDW: 13.6 % (ref 11.5–15.5)
WBC: 4.2 K/uL (ref 4.0–10.5)
nRBC: 0 % (ref 0.0–0.2)

## 2024-01-03 LAB — GLUCOSE, CAPILLARY
Glucose-Capillary: 96 mg/dL (ref 70–99)
Glucose-Capillary: 115 mg/dL — ABNORMAL HIGH (ref 70–99)
Glucose-Capillary: 121 mg/dL — ABNORMAL HIGH (ref 70–99)
Glucose-Capillary: 136 mg/dL — ABNORMAL HIGH (ref 70–99)

## 2024-01-03 LAB — MAGNESIUM: Magnesium: 1.8 mg/dL (ref 1.7–2.4)

## 2024-01-03 NOTE — Progress Notes (Signed)
 [facility] Progress Note  6 Days Post-Op  Subjective: Patient seen and examined.  He is resting comfortably in bed.  He states that he is feeling very well.  He had a large bowel movement this morning and he confirms passing flatus.  He tolerated his clears without nausea and vomiting and denies any worsening abdominal pain or abdominal distention like when he last had his NG tube removed.  Objective: Vital signs in last 24 hours: Temp:  [98.4 F (36.9 C)-98.5 F (36.9 C)] 98.4 F (36.9 C) (08/31 0438) Pulse Rate:  [65-75] 70 (08/31 0438) Resp:  [14-18] 14 (08/31 0438) BP: (139-144)/(66-74) 139/74 (08/31 0438) SpO2:  [94 %-98 %] 98 % (08/31 0438) Last BM Date : 01/03/24  Intake/Output from previous day: 08/30 0701 - 08/31 0700 In: 1371.3 [P.O.:780; I.V.:531.3; NG/GT:60] Out: 150 [Emesis/NG output:150] Intake/Output this shift: No intake/output data recorded.  General appearance: alert, cooperative, and no distress Nose: NG tube in place, currently clamped GI: Soft, nondistended, no percussion tenderness, nontender to palpation; no rigidity, guarding, rebound tenderness; midline incision C/D/I with skin staples in place  Lab Results:  Recent Labs    01/02/24 0443 01/03/24 0500  WBC 3.5* 4.2  HGB 8.6* 8.9*  HCT 26.5* 27.4*  PLT 175 172   BMET Recent Labs    01/02/24 0443 01/03/24 0500  NA 144 143  K 3.5 3.5  CL 104 105  CO2 29 32  GLUCOSE 81 100*  BUN 24* 21  CREATININE 1.44* 1.31*  CALCIUM 9.7 9.5   PT/INR No results for input(s): LABPROT, INR in the last 72 hours.  Studies/Results: DG Abd 1 View Result Date: 01/03/2024 EXAM: 1 VIEW XRAY OF THE ABDOMEN 01/03/2024 10:27:46 AM COMPARISON: 01/01/2024 CLINICAL HISTORY: Post op ileus FINDINGS: BOWEL: Persistent gaseous distention of small and large bowel, similar to prior. Enteric tube tip in the stomach with side port ne um ar the gastroesophageal junction. SOFT TISSUES: NG tube side port  in gastric body. Midline laparotomy skin staples noted. BONES: No acute osseous abnormality. IMPRESSION: 1. Persistent gaseous distention of small and large bowel, similar to prior, consistent with post op ileus. Electronically signed by: Norman Gatlin MD 01/03/2024 10:58 AM EDT RP Workstation: [id number]    Anti-infectives: Anti-infectives (From admission, onward)    Start     Dose/Rate Route Frequency Ordered Stop   12/28/23 1530  ciprofloxacin  (CIPRO ) IVPB 400 mg        400 mg 200 mL/hr over 60 Minutes Intravenous On call to O.R. 12/28/23 1518 12/28/23 1619   12/28/23 1530  metroNIDAZOLE  (FLAGYL ) IVPB 500 mg        500 mg 100 mL/hr over 60 Minutes Intravenous On call to O.R. 12/28/23 1518 12/28/23 1640       Assessment/Plan:  Patient is a 78 year old male who was admitted with small bowel obstruction and concern for possible closed-loop pathology.  He is status post exploratory laparotomy, lysis of adhesions, and primary repair of ventral hernia on 8/25.   -KUB this morning with some persistently dilated loops of small bowel, consistent with ileus.  I suspect that the patient will have some persistently dilated loops given the amount of intra-abdominal lysis of adhesions at the time of surgery. -Will remove NG tube and advance to full liquids.  If patient tolerates fulls without nausea and vomiting, can advance to soft food -Advised patient should go slow to verify he tolerates this to try to decrease risk of having to replace NG tube -If patient  begins to have worsening nausea, vomiting, and abdominal pain, would consider replacement of NG tube -Hemoglobin stable -Monitor for bowel function -Continue Dulcolax suppository -IVF -PRN pain control and antiemetics -Appreciate hospitalist recommendations   LOS: 6 days    Almas Rake A Rollen Selders 01/03/2024  Note: Portions of this report may have been transcribed using voice recognition software. Every effort has been made to ensure  accuracy; however, inadvertent computerized transcription errors may still be present.

## 2024-01-03 NOTE — Progress Notes (Signed)
 PROGRESS NOTE    Kevin [name]  FMW:[id number] DOB: 07-Mar-1946 DOA: 12/28/2023 PCP: Marvine Rush, MD   Brief Narrative:    78 year old male with a history of coronary artery disease status post LAD and circumflex stenting 2001 and 2004, 02/14/2015 revealed diffuse LAD in-stent restenosis which was restented, diabetes mellitus type 2, hypertension, hyperlipidemia, prostate cancer, hypothyroidism, SBO from volvulus presenting (2023) with 1 day history of abdominal pain with nausea and vomiting. The patient states that he had been off of his Mounjaro for about 2 months because of his prostate cancer treatments. He restarted it on 12/26/2023. On the morning of 12/27/2023, the patient began developing abdominal pain.  Patient was admitted for small bowel obstruction on 8/25 and underwent ex lap with LOA and primary repair of ventral hernia on that same day.  Assessment & Plan:   Principal Problem:   Small bowel obstruction (HCC) Active Problems:   Coronary artery disease, hx LAD and LCX stenting in 2001 & 2004, last cath 2009 with patent stents.   Essential hypertension   Obesity (BMI 30-39.9)   Type 2 diabetes mellitus with hyperglycemia (HCC)   Mixed hyperlipidemia   Chronic kidney disease, stage 3a (HCC)   Ventral hernia without obstruction or gangrene   Intra-abdominal adhesions  Assessment and Plan:   Small bowel obstruction -General Surgery consult appreciated - 12/28/23--Ex lap with LOA and primary repair of ventral hernia  - NG tube was almost coming out on 8/27 and it appears that it has been removed - Judicious opioids - General Surgery plans to remove NG tube and advance to full liquid diet today -Discontinue IV fluids   Essential hypertension - Resume oral antihypertensives   AKI on CKD stage IIIa-improving - Baseline creatinine 1.2-1.4 -Maintained on some gentle IV fluid, restart today until diet advanced - Monitor BMP   Coronary artery disease - History of LAD and  Lcx stents 2001 and 2004 - Restented LAD 2016 - No chest pain presently - personally reviewed EKG--sinus, no STT changes - Holding aspirin  and Plavix  temporarily>>restart when ok with general surgery   Mixed hyperlipidemia - Resume statin   Diabetes mellitus type 2 with hyperglycemia - NovoLog  sliding scale - 06/30/2023 hemoglobin A1c--6.4 - NovoLog  scale   Prostate cancer - Patient finished radiation approximately 2 weeks prior to admission   Obesity, class I - BMI 33.48 - Lifestyle modification   Hypothyroidism - Resume Synthroid     DVT prophylaxis:Heparin  Code Status: Full Family Communication: None at bedside Disposition Plan:  Status is: Inpatient Remains inpatient appropriate because: Need for IV fluid   Consultants:  General surgery  Procedures:  8/25-ex lap with LOA and primary repair of ventral hernia  Antimicrobials:  Anti-infectives (From admission, onward)    Start     Dose/Rate Route Frequency Ordered Stop   12/28/23 1530  ciprofloxacin  (CIPRO ) IVPB 400 mg        400 mg 200 mL/hr over 60 Minutes Intravenous On call to O.R. 12/28/23 1518 12/28/23 1619   12/28/23 1530  metroNIDAZOLE  (FLAGYL ) IVPB 500 mg        500 mg 100 mL/hr over 60 Minutes Intravenous On call to O.R. 12/28/23 1518 12/28/23 1640       Subjective: Patient seen and evaluated and states that he is overall feeling much better and has been tolerating clear liquid diet with no nausea or vomiting since NG tube has been clamped.  He feels as though he will have a bowel movement.  Objective: Vitals:  01/02/24 0939 01/02/24 1333 01/02/24 1954 01/03/24 0438  BP: (!) 154/64 (!) 144/68 (!) 144/66 139/74  Pulse: 77 75 65 70  Resp:  18 18 14   Temp:  98.4 F (36.9 C) 98.5 F (36.9 C) 98.4 F (36.9 C)  TempSrc:  Oral Oral Oral  SpO2:  94% 96% 98%  Weight:      Height:        Intake/Output Summary (Last 24 hours) at 01/03/2024 1159 Last data filed at 01/03/2024 0520 Gross per 24 hour   Intake 1341.27 ml  Output 150 ml  Net 1191.27 ml   Filed Weights   12/28/23 0714 12/28/23 1254 12/28/23 1531  Weight: 104.3 kg 107.7 kg 104.3 kg    Examination:  General exam: Appears calm and comfortable  Respiratory system: Clear to auscultation. Respiratory effort normal. Cardiovascular system: S1 & S2 heard, RRR.  Gastrointestinal system: Abdomen is soft, binder present, NG tube currently clamped Central nervous system: Alert and awake Extremities: No edema Skin: No significant lesions noted Psychiatry: Flat affect.    Data Reviewed: I have personally reviewed following labs and imaging studies  CBC: Recent Labs  Lab 12/30/23 0435 12/31/23 0430 01/01/24 0402 01/02/24 0443 01/03/24 0500  WBC 3.9* 2.8* 3.7* 3.5* 4.2  HGB 9.0* 8.2* 9.0* 8.6* 8.9*  HCT 27.0* 25.2* 27.4* 26.5* 27.4*  MCV 100.4* 101.2* 102.6* 102.3* 101.5*  PLT 124* 134* 164 175 172   Basic Metabolic Panel: Recent Labs  Lab 12/30/23 0435 12/31/23 0430 01/01/24 0402 01/02/24 0443 01/03/24 0500  NA 135 135 140 144 143  K 4.3 3.8 4.1 3.5 3.5  CL 102 103 101 104 105  CO2 24 25 28 29  32  GLUCOSE 115* 91 99 81 100*  BUN 30* 29* 24* 24* 21  CREATININE 1.87* 1.58* 1.43* 1.44* 1.31*  CALCIUM 8.8* 8.9 9.5 9.7 9.5  MG 1.7 1.8 1.9 1.8 1.8  PHOS 3.5  --   --   --   --    GFR: Estimated Creatinine Clearance: 56.7 mL/min (A) (by C-G formula based on SCr of 1.31 mg/dL (H)). Liver Function Tests: Recent Labs  Lab 12/28/23 0755 01/01/24 0402 01/02/24 0443  AST 13* 12* 16  ALT 12 11 14   ALKPHOS 46 35* 35*  BILITOT 1.1 0.8 1.0  PROT 6.8 5.8* 5.8*  ALBUMIN  3.9 3.1* 3.2*   Recent Labs  Lab 12/28/23 0755  LIPASE 27   No results for input(s): AMMONIA in the last 168 hours. Coagulation Profile: No results for input(s): INR, PROTIME in the last 168 hours. Cardiac Enzymes: No results for input(s): CKTOTAL, CKMB, CKMBINDEX, TROPONINI in the last 168 hours. BNP (last 3 results) No  results for input(s): PROBNP in the last 8760 hours. HbA1C: No results for input(s): HGBA1C in the last 72 hours.  CBG: Recent Labs  Lab 01/02/24 1111 01/02/24 1603 01/02/24 2101 01/03/24 0706 01/03/24 1105  GLUCAP 79 106* 102* 96 115*   Lipid Profile: No results for input(s): CHOL, HDL, LDLCALC, TRIG, CHOLHDL, LDLDIRECT in the last 72 hours. Thyroid  Function Tests: No results for input(s): TSH, T4TOTAL, FREET4, T3FREE, THYROIDAB in the last 72 hours. Anemia Panel: No results for input(s): VITAMINB12, FOLATE, FERRITIN, TIBC, IRON, RETICCTPCT in the last 72 hours. Sepsis Labs: Recent Labs  Lab 12/28/23 1111 12/28/23 1239  LATICACIDVEN 1.0 1.2    No results found for this or any previous visit (from the past 240 hours).       Radiology Studies: DG Abd 1 View Result Date:  01/03/2024 EXAM: 1 VIEW XRAY OF THE ABDOMEN 01/03/2024 10:27:46 AM COMPARISON: 01/01/2024 CLINICAL HISTORY: Post op ileus FINDINGS: BOWEL: Persistent gaseous distention of small and large bowel, similar to prior. Enteric tube tip in the stomach with side port ne um ar the gastroesophageal junction. SOFT TISSUES: NG tube side port in gastric body. Midline laparotomy skin staples noted. BONES: No acute osseous abnormality. IMPRESSION: 1. Persistent gaseous distention of small and large bowel, similar to prior, consistent with post op ileus. Electronically signed by: Norman Gatlin MD 01/03/2024 10:58 AM EDT RP Workstation: [id number]         Scheduled Meds:  bisacodyl   10 mg Rectal Daily   heparin   5,000 Units Subcutaneous Q8H   insulin  aspart  0-5 Units Subcutaneous QHS   insulin  aspart  0-6 Units Subcutaneous TID WC   levothyroxine   50 mcg Oral QAC breakfast   metoprolol  succinate  100 mg Oral Daily   And   metoprolol  succinate  50 mg Oral QHS   pravastatin   40 mg Oral QPM   tamsulosin   0.4 mg Oral QPM      LOS: 6 days    Time spent: 55  minutes    Kevin Stanco JONETTA Fairly, DO [facility]  If 7PM-7AM, please contact night-coverage [email or web address] 01/03/2024, 11:59 AM

## 2024-01-03 NOTE — Plan of Care (Signed)
  Problem: Education: Goal: Knowledge of General Education information will improve Description: Including pain rating scale, medication(s)/side effects and non-pharmacologic comfort measures Outcome: Adequate for Discharge   Problem: Clinical Measurements: Goal: Ability to maintain clinical measurements within normal limits will improve Outcome: Adequate for Discharge Goal: Diagnostic test results will improve Outcome: Adequate for Discharge

## 2024-01-04 DIAGNOSIS — K56609 Unspecified intestinal obstruction, unspecified as to partial versus complete obstruction: Secondary | ICD-10-CM | POA: Diagnosis not present

## 2024-01-04 LAB — GLUCOSE, CAPILLARY
Glucose-Capillary: 88 mg/dL (ref 70–99)
Glucose-Capillary: 148 mg/dL — ABNORMAL HIGH (ref 70–99)

## 2024-01-04 MED ORDER — ONDANSETRON HCL 4 MG PO TABS: 4.0000 mg | ORAL_TABLET | Freq: Four times a day (QID) | ORAL | 0 refills | Status: AC | PRN

## 2024-01-04 MED ORDER — OXYCODONE HCL 5 MG PO TABS: 5.0000 mg | ORAL_TABLET | Freq: Three times a day (TID) | ORAL | 0 refills | Status: DC | PRN

## 2024-01-04 MED ORDER — BISACODYL 10 MG RE SUPP: 10.0000 mg | Freq: Every day | RECTAL | 0 refills | Status: AC

## 2024-01-04 NOTE — Discharge Summary (Signed)
 Physician Discharge Summary  Kevin [name] FMW:[id number] DOB: June 11, 1945 DOA: 12/28/2023  PCP: Marvine Rush, MD  Admit date: 12/28/2023  Discharge date: 01/04/2024  Admitted From:Home  Disposition:  Home  Recommendations for Outpatient Follow-up:  Follow up with general surgery in 1 week Pain medications prescribed as needed Nausea medications prescribed as needed Suppository prescribed Any other home medications as prior  Home Health: Yes with home health services  Equipment/Devices: None  Discharge Condition:Stable  CODE STATUS: Full  Diet recommendation: Heart Healthy/carb modified  Brief/Interim Summary: 78 year old male with a history of coronary artery disease status post LAD and circumflex stenting 2001 and 2004, 02/14/2015 revealed diffuse LAD in-stent restenosis which was restented, diabetes mellitus type 2, hypertension, hyperlipidemia, prostate cancer, hypothyroidism, SBO from volvulus presenting (2023) with 1 day history of abdominal pain with nausea and vomiting. The patient states that he had been off of his Mounjaro for about 2 months because of his prostate cancer treatments. He restarted it on 12/26/2023. On the morning of 12/27/2023, the patient began developing abdominal pain. Patient was admitted for small bowel obstruction on 8/25 and underwent ex lap with LOA and primary repair of ventral hernia on that same day.  He struggled with some postoperative ileus and required replacement of his NG tube and had gradual dietary advancement over time.  He is now mobilizing and tolerating diet without any further nausea or vomiting or abdominal pain and is having bowel movements as well as flatus.  He is overall in stable condition for discharge and will follow-up with general surgery outpatient.  Discharge Diagnoses:  Principal Problem:   Small bowel obstruction (HCC) Active Problems:   Coronary artery disease, hx LAD and LCX stenting in 2001 & 2004, last cath 2009 with  patent stents.   Essential hypertension   Obesity (BMI 30-39.9)   Type 2 diabetes mellitus with hyperglycemia (HCC)   Mixed hyperlipidemia   Chronic kidney disease, stage 3a (HCC)   Ventral hernia without obstruction or gangrene   Intra-abdominal adhesions  Principal discharge diagnosis: Small bowel obstruction status post ex lap with lysis of adhesions and primary ventral hernia repair 8/25.  Discharge Instructions  Discharge Instructions     Diet - low sodium heart healthy   Complete by: As directed    If the dressing is still on your incision site when you go home, remove it on the third day after your surgery date. Remove dressing if it begins to fall off, or if it is dirty or damaged before the third day.   Complete by: As directed    Increase activity slowly   Complete by: As directed       Allergies as of 01/04/2024       Reactions   Irbesartan  Shortness Of Breath, Other (See Comments)   Prednisone  Anaphylaxis, Swelling, Other (See Comments)   Swelling of the lips   Ramipril Shortness Of Breath, Other (See Comments)   Shellfish Allergy  Anaphylaxis   ALL SHELLFISH  Shrimp/  crab   Tradjenta [linagliptin] Shortness Of Breath   Cephalexin Hives, Swelling   Clindamycin Hcl Hives, Swelling   Codeine Nausea And Vomiting, Other (See Comments)   Affects heart   Contrast Media [iodinated Contrast Media] Other (See Comments)   Very sick  Can be administered if Benadryl  is administered prior to prevent reaction    Fenofibrate Other (See Comments)   My skin started peeling off   Glycotrol [support-500] Other (See Comments)   Unknown    Indomethacin Other (See  Comments)   Unknown    Minocycline Hcl Hives, Swelling   Morphine And Codeine Nausea And Vomiting   Projectile vomiting   Sulfa Drugs Cross Reactors Hives, Swelling   Tetracyclines & Related Other (See Comments)   When exposed to sun, skin turned bluish        Medication List     STOP taking these  medications    azithromycin  250 MG tablet Commonly known as: ZITHROMAX        TAKE these medications    aspirin  81 MG tablet Take 81 mg by mouth daily.   b complex vitamins capsule Take 1 capsule by mouth daily.   bisacodyl  10 MG suppository Commonly known as: DULCOLAX Place 1 suppository (10 mg total) rectally daily. Start taking on: January 05, 2024   clopidogrel  75 MG tablet Commonly known as: PLAVIX  TAKE ONE TABLET BY MOUTH ONCE DAILY.   CoQ-10 100 MG Caps Take 1 capsule by mouth daily.   diphenhydrAMINE  25 MG tablet Commonly known as: BENADRYL  Take 25 mg by mouth every 6 (six) hours as needed for itching or allergies.   EYE ALLERGY  RELIEF OP Place 2 drops into both eyes as needed.   Fish Oil-Flax Oil-Borage Oil Caps Take 3,000 mg by mouth every morning.   fluticasone  50 MCG/ACT nasal spray Commonly known as: FLONASE  Place 2 sprays into both nostrils daily.   Januvia 100 MG tablet Generic drug: sitaGLIPtin Take 100 mg by mouth daily.   levothyroxine  50 MCG tablet Commonly known as: SYNTHROID  Take 50 mcg by mouth daily before breakfast.   metoprolol  succinate 100 MG 24 hr tablet Commonly known as: TOPROL -XL Take 50-100 mg by mouth 2 (two) times daily. Take 1 (100 mg) tablet in the morning and 1/2 tablet (50 mg) every evening   Mounjaro 2.5 MG/0.5ML Pen Generic drug: tirzepatide Inject 2.5 mg into the skin once a week.   niacin  1000 MG CR tablet Commonly known as: NIASPAN  TAKE 1 TABLET BY MOUTH AT BEDTIME.   ondansetron  4 MG tablet Commonly known as: ZOFRAN  Take 1 tablet (4 mg total) by mouth every 6 (six) hours as needed for nausea.   oxyCODONE  5 MG immediate release tablet Commonly known as: Roxicodone  Take 1 tablet (5 mg total) by mouth every 8 (eight) hours as needed for moderate pain (pain score 4-6), severe pain (pain score 7-10) or breakthrough pain.   pravastatin  40 MG tablet Commonly known as: PRAVACHOL  Take 1 tablet (40 mg total) by  mouth every evening.   tamsulosin  0.4 MG Caps capsule Commonly known as: FLOMAX  Take 0.4 mg by mouth every evening.   Turmeric Curcumin Caps Take 1 capsule by mouth every morning.   valsartan -hydrochlorothiazide  160-12.5 MG tablet Commonly known as: DIOVAN -HCT Take 1 tablet by mouth daily.   vitamin C 1000 MG tablet Take 1,000 mg by mouth daily.   Vitamin D3 25 MCG Caps Take 1 capsule by mouth every other day.               Durable Medical Equipment  (From admission, onward)           Start     Ordered   12/29/23 1150  For home use only DME Walker rolling  Once       Comments: Patient would benefit from RW once discharged as pt demo slight unsteadiness once in weight bearing positions due to post-op abdominal pain which increases fall risk.  11:50 AM, 12/29/23 Rosaria Settler, PT, DPT  with [facility]  Question Answer Comment  Walker: With 5 Inch Wheels   Patient needs a walker to treat with the following condition Balance problem   Patient needs a walker to treat with the following condition General weakness      12/29/23 1151              Discharge Care Instructions  (From admission, onward)           Start     Ordered   01/04/24 0000  If the dressing is still on your incision site when you go home, remove it on the third day after your surgery date. Remove dressing if it begins to fall off, or if it is dirty or damaged before the third day.        01/04/24 1338            Follow-up Information     Pappayliou, Dorothyann A, DO. Call.   Specialty: General Surgery Why: Call to schedule follow-up in 1 week for staple removal Contact information: [address] [place] [facility] [address] [phone]                Allergies  Allergen Reactions   Irbesartan  Shortness Of Breath and Other (See Comments)   Prednisone  Anaphylaxis, Swelling and Other (See Comments)    Swelling of the lips   Ramipril  Shortness Of Breath and Other (See Comments)   Shellfish Allergy  Anaphylaxis    ALL SHELLFISH  Shrimp/  crab   Tradjenta [Linagliptin] Shortness Of Breath   Cephalexin Hives and Swelling   Clindamycin Hcl Hives and Swelling   Codeine Nausea And Vomiting and Other (See Comments)    Affects heart   Contrast Media [Iodinated Contrast Media] Other (See Comments)    Very sick  Can be administered if Benadryl  is administered prior to prevent reaction    Fenofibrate Other (See Comments)    My skin started peeling off   Glycotrol [Support-500] Other (See Comments)    Unknown    Indomethacin Other (See Comments)    Unknown    Minocycline Hcl Hives and Swelling   Morphine And Codeine Nausea And Vomiting    Projectile vomiting   Sulfa Drugs Cross Reactors Hives and Swelling   Tetracyclines & Related Other (See Comments)    When exposed to sun, skin turned bluish    Consultations: General Surgery   Procedures/Studies: DG Abd 1 View Result Date: 01/03/2024 EXAM: 1 VIEW XRAY OF THE ABDOMEN 01/03/2024 10:27:46 AM COMPARISON: 01/01/2024 CLINICAL HISTORY: Post op ileus FINDINGS: BOWEL: Persistent gaseous distention of small and large bowel, similar to prior. Enteric tube tip in the stomach with side port ne um ar the gastroesophageal junction. SOFT TISSUES: NG tube side port in gastric body. Midline laparotomy skin staples noted. BONES: No acute osseous abnormality. IMPRESSION: 1. Persistent gaseous distention of small and large bowel, similar to prior, consistent with post op ileus. Electronically signed by: Norman Gatlin MD 01/03/2024 10:58 AM EDT RP Workstation: [id number]   DG Abd 1 View Result Date: 01/01/2024 EXAM: 1 VIEW XRAY OF THE ABDOMEN 01/01/2024 08:47:46 AM COMPARISON: 12/31/2023 abdominal radiograph CLINICAL HISTORY: Postoperative ileus, small bowel obstruction. FINDINGS: BOWEL: Generalized mild-to-moderate small bowel dilatation has improved. Mild-to-moderate diffuse colonic  stool. No pneumatosis or pneumoperitoneum. SOFT TISSUES: Vertical skin staples overall the abdomen. Fiducial markers overlie the region of the prostate. Enteric tube terminates over the gastric fundus with side port just below the esophagus gastric junction. BONES: Moderate lumbar spondylosis. No acute osseous abnormality. IMPRESSION:  1. Improving postoperative ileus. Electronically signed by: Selinda Blue MD 01/01/2024 09:54 AM EDT RP Workstation: [id number]   DG Abd Portable 1V Result Date: 12/31/2023 EXAM: 1 VIEW XRAY OF THE ABDOMEN 12/31/2023 04:16:00 PM COMPARISON: 12/31/2023 CLINICAL HISTORY: S/p NGT placement today, hx sbo FINDINGS: BOWEL: Persistent gaseous distention of small bowel is stable. SOFT TISSUES: No opaque urinary calculi. BONES: No acute osseous abnormality. LINES AND TUBES: NG tube in place with tip in proximal stomach. Side port near GE junction. Consider advancing several centimeters. IMPRESSION: 1. NG tube in place with tip in proximal stomach and side port near GE junction. Consider advancing several centimeters. 2. Distended stomach with air. 3. Persistent gaseous distention of small bowel, stable. Electronically signed by: Lonni Necessary MD 12/31/2023 04:24 PM EDT RP Workstation: [id number]   DG Abd 1 View Result Date: 12/31/2023 CLINICAL DATA:  Postoperative ileus and nausea. EXAM: ABDOMEN - 1 VIEW COMPARISON:  Radiograph dated 12/28/2023. FINDINGS: Interval removal of the enteric tube. There is moderate gaseous distension of the stomach. There is air distended loops of small bowel measuring up to 4.7 cm in caliber. Stool and air noted in the colon. Midline vertical anterior abdominal wall cutaneous staples noted. There is degenerative changes of the spine and hips. No acute osseous pathology. Penile implant. IMPRESSION: Findings favor to represent an ileus. Developing obstruction is not excluded. Clinical correlation and follow-up recommended. Electronically Signed   By: Vanetta Chou M.D.   On: 12/31/2023 13:23   DG Abd Portable 1V Result Date: 12/28/2023 CLINICAL DATA:  Nasogastric tube placement. EXAM: PORTABLE ABDOMEN - 1 VIEW COMPARISON:  CT earlier today FINDINGS: Tip and side port of the enteric tube below the diaphragm in the stomach. Bowel dilatation on CT is not included in the field of view. IMPRESSION: Tip and side port of the enteric tube below the diaphragm in the stomach. Electronically Signed   By: Andrea Gasman M.D.   On: 12/28/2023 12:40   CT ABDOMEN PELVIS WO CONTRAST Result Date: 12/28/2023 CLINICAL DATA:  Abdominal pain, acute, nonlocalized. EXAM: CT ABDOMEN AND PELVIS WITHOUT CONTRAST TECHNIQUE: Multidetector CT imaging of the abdomen and pelvis was performed following the standard protocol without IV contrast. RADIATION DOSE REDUCTION: This exam was performed according to the departmental dose-optimization program which includes automated exposure control, adjustment of the mA and/or kV according to patient size and/or use of iterative reconstruction technique. COMPARISON:  CT 11/03/2023 FINDINGS: Lower chest: Lung bases are clear. Small amount of pericardial fluid. Hepatobiliary: Again noted is trace perihepatic ascites. Cholelithiasis. No significant gallbladder distension. No biliary dilatation. Pancreas: Unremarkable. No pancreatic ductal dilatation or surrounding inflammatory changes. Spleen: Again noted is a small peripherally calcified low-density structure in the medial spleen on image 18/2. Suspect this is an incidental finding. Spleen size is normal. Adrenals/Urinary Tract: Normal adrenal glands. Normal appearance of both kidneys without hydronephrosis. Negative for kidney stones. No suspicious renal lesions. Normal appearance of the urinary bladder. Stomach/Bowel: Dilated loops small bowel with some adjacent mesenteric edema. There is a small bowel transition point on image 47/2. Etiology could represent adhesions versus internal hernia. Small  pockets of peripheral gas in small bowel loops in the mid lower abdomen with adjacent mesenteric edema. Findings raise concern for possible pneumatosis in these areas. Scattered colonic diverticula without colonic inflammation. Small bowel loops associated with midline ventral hernia. No significant gastric distension. Proximal small bowel is decompressed. Vascular/Lymphatic: Aortic atherosclerosis. Mildly prominent left external iliac lymph node on image 78/2 measures 1.2 cm in  the short axis and minimal change from the recent comparison examination. Previously this lymph node was PET avid. Reproductive: Enlarged prostate with fiducial markers. Penile prosthesis implants. Other: Trace fluid in the pelvis. Trace perihepatic ascites. Large ventral hernia containing loops of dilated small bowel. Negative for pneumoperitoneum. Musculoskeletal: Disc space narrowing at L2-L3. No acute bone abnormality. IMPRESSION: 1. Dilated small bowel loops compatible with a small bowel obstruction. There is a small amount of mesenteric edema adjacent to dilated small bowel loops and cannot exclude developing pneumatosis. Transition point in small bowel is identified in the mid abdomen. There are decompressed small bowel loops proximal and distal to the dilated loops of small bowel which raises concern for an internal hernia as the etiology for the small bowel obstruction. 2. Large ventral hernia containing loops of dilated small bowel but the ventral hernia does not appear to be the cause for the small bowel obstruction. 3. Small amount of abdominopelvic ascites. 4.  Aortic Atherosclerosis (ICD10-I70.0). 5. Cholelithiasis. 6. Mildly prominent left external iliac lymph node has minimally changed. Critical Value/emergent results were called by telephone at the time of interpretation on 12/28/2023 at 10:27 am to provider [facility] ZAMMIT , who verbally acknowledged these results. Electronically Signed   By: Juliene Balder M.D.   On: 12/28/2023  11:29     Discharge Exam: Vitals:   01/03/24 1922 01/04/24 0419  BP: 132/85 125/64  Pulse: 67 63  Resp: 18 18  Temp: 98.6 F (37 C) 98.4 F (36.9 C)  SpO2: 100% 98%   Vitals:   01/03/24 0438 01/03/24 1439 01/03/24 1922 01/04/24 0419  BP: 139/74 138/79 132/85 125/64  Pulse: 70 71 67 63  Resp: 14 16 18 18   Temp: 98.4 F (36.9 C)  98.6 F (37 C) 98.4 F (36.9 C)  TempSrc: Oral  Oral Oral  SpO2: 98% 100% 100% 98%  Weight:      Height:        General: Pt is alert, awake, not in acute distress Cardiovascular: RRR, S1/S2 +, no rubs, no gallops Respiratory: CTA bilaterally, no wheezing, no rhonchi Abdominal: Soft, NT, ND, bowel sounds + Extremities: no edema, no cyanosis    The results of significant diagnostics from this hospitalization (including imaging, microbiology, ancillary and laboratory) are listed below for reference.     Microbiology: No results found for this or any previous visit (from the past 240 hours).   Labs: BNP (last 3 results) No results for input(s): BNP in the last 8760 hours. Basic Metabolic Panel: Recent Labs  Lab 12/30/23 0435 12/31/23 0430 01/01/24 0402 01/02/24 0443 01/03/24 0500  NA 135 135 140 144 143  K 4.3 3.8 4.1 3.5 3.5  CL 102 103 101 104 105  CO2 24 25 28 29  32  GLUCOSE 115* 91 99 81 100*  BUN 30* 29* 24* 24* 21  CREATININE 1.87* 1.58* 1.43* 1.44* 1.31*  CALCIUM 8.8* 8.9 9.5 9.7 9.5  MG 1.7 1.8 1.9 1.8 1.8  PHOS 3.5  --   --   --   --    Liver Function Tests: Recent Labs  Lab 01/01/24 0402 01/02/24 0443  AST 12* 16  ALT 11 14  ALKPHOS 35* 35*  BILITOT 0.8 1.0  PROT 5.8* 5.8*  ALBUMIN  3.1* 3.2*   No results for input(s): LIPASE, AMYLASE in the last 168 hours. No results for input(s): AMMONIA in the last 168 hours. CBC: Recent Labs  Lab 12/30/23 0435 12/31/23 0430 01/01/24 0402 01/02/24 0443 01/03/24 0500  WBC 3.9* 2.8* 3.7* 3.5* 4.2  HGB 9.0* 8.2* 9.0* 8.6* 8.9*  HCT 27.0* 25.2* 27.4* 26.5*  27.4*  MCV 100.4* 101.2* 102.6* 102.3* 101.5*  PLT 124* 134* 164 175 172   Cardiac Enzymes: No results for input(s): CKTOTAL, CKMB, CKMBINDEX, TROPONINI in the last 168 hours. BNP: Invalid input(s): POCBNP CBG: Recent Labs  Lab 01/03/24 1105 01/03/24 1612 01/03/24 1924 01/04/24 0722 01/04/24 1104  GLUCAP 115* 121* 136* 88 148*   D-Dimer No results for input(s): DDIMER in the last 72 hours. Hgb A1c No results for input(s): HGBA1C in the last 72 hours. Lipid Profile No results for input(s): CHOL, HDL, LDLCALC, TRIG, CHOLHDL, LDLDIRECT in the last 72 hours. Thyroid  function studies No results for input(s): TSH, T4TOTAL, T3FREE, THYROIDAB in the last 72 hours.  Invalid input(s): FREET3 Anemia work up No results for input(s): VITAMINB12, FOLATE, FERRITIN, TIBC, IRON, RETICCTPCT in the last 72 hours. Urinalysis    Component Value Date/Time   COLORURINE STRAW (A) 12/28/2023 0922   APPEARANCEUR CLEAR 12/28/2023 0922   LABSPEC 1.010 12/28/2023 0922   PHURINE 7.0 12/28/2023 0922   GLUCOSEU NEGATIVE 12/28/2023 0922   HGBUR NEGATIVE 12/28/2023 0922   BILIRUBINUR NEGATIVE 12/28/2023 0922   KETONESUR NEGATIVE 12/28/2023 0922   PROTEINUR NEGATIVE 12/28/2023 0922   UROBILINOGEN 0.2 10/01/2010 1544   NITRITE NEGATIVE 12/28/2023 0922   LEUKOCYTESUR NEGATIVE 12/28/2023 0922   Sepsis Labs Recent Labs  Lab 12/31/23 0430 01/01/24 0402 01/02/24 0443 01/03/24 0500  WBC 2.8* 3.7* 3.5* 4.2   Microbiology No results found for this or any previous visit (from the past 240 hours).   Time coordinating discharge: 35 minutes  SIGNED:   Adron JONETTA Fairly, DO [facility] 01/04/2024, 1:39 PM  If 7PM-7AM, please contact night-coverage [email or web address]

## 2024-01-04 NOTE — Progress Notes (Signed)
 [facility] Progress Note  7 Days Post-Op  Subjective: Patient seen and examined.  He is sitting comfortably at the side of the bed.  He tolerated full liquids without nausea and vomiting.  He has not had another bowel movement since yesterday, but he continues to pass flatus.  Denies worsening abdominal pain.  Objective: Vital signs in last 24 hours: Temp:  [98.4 F (36.9 C)-98.6 F (37 C)] 98.4 F (36.9 C) (09/01 0419) Pulse Rate:  [63-71] 63 (09/01 0419) Resp:  [16-18] 18 (09/01 0419) BP: (125-138)/(64-85) 125/64 (09/01 0419) SpO2:  [98 %-100 %] 98 % (09/01 0419) Last BM Date : 01/03/24  Intake/Output from previous day: 08/31 0701 - 09/01 0700 In: 240 [P.O.:240] Out: -  Intake/Output this shift: No intake/output data recorded.  General appearance: alert, cooperative, and no distress GI: Abdomen soft, nondistended, no percussion tenderness, nontender to palpation; no rigidity, guarding, rebound tenderness; midline incision C/D/I with skin staples in place  Lab Results:  Recent Labs    01/02/24 0443 01/03/24 0500  WBC 3.5* 4.2  HGB 8.6* 8.9*  HCT 26.5* 27.4*  PLT 175 172   BMET Recent Labs    01/02/24 0443 01/03/24 0500  NA 144 143  K 3.5 3.5  CL 104 105  CO2 29 32  GLUCOSE 81 100*  BUN 24* 21  CREATININE 1.44* 1.31*  CALCIUM 9.7 9.5   PT/INR No results for input(s): LABPROT, INR in the last 72 hours.  Studies/Results: DG Abd 1 View Result Date: 01/03/2024 EXAM: 1 VIEW XRAY OF THE ABDOMEN 01/03/2024 10:27:46 AM COMPARISON: 01/01/2024 CLINICAL HISTORY: Post op ileus FINDINGS: BOWEL: Persistent gaseous distention of small and large bowel, similar to prior. Enteric tube tip in the stomach with side port ne um ar the gastroesophageal junction. SOFT TISSUES: NG tube side port in gastric body. Midline laparotomy skin staples noted. BONES: No acute osseous abnormality. IMPRESSION: 1. Persistent gaseous distention of small and large bowel,  similar to prior, consistent with post op ileus. Electronically signed by: Norman Gatlin MD 01/03/2024 10:58 AM EDT RP Workstation: [id number]    Anti-infectives: Anti-infectives (From admission, onward)    Start     Dose/Rate Route Frequency Ordered Stop   12/28/23 1530  ciprofloxacin  (CIPRO ) IVPB 400 mg        400 mg 200 mL/hr over 60 Minutes Intravenous On call to O.R. 12/28/23 1518 12/28/23 1619   12/28/23 1530  metroNIDAZOLE  (FLAGYL ) IVPB 500 mg        500 mg 100 mL/hr over 60 Minutes Intravenous On call to O.R. 12/28/23 1518 12/28/23 1640       Assessment/Plan:  Patient is a 78 year old male who was admitted with small bowel obstruction and concern for possible closed-loop pathology.  He is status post exploratory laparotomy, lysis of adhesions, and primary repair of ventral hernia on 8/25.   -Advance to GI soft -Monitor for continued bowel function -Continue Dulcolax suppository -IVF -PRN pain control and antiemetics -Appreciate hospitalist recommendations -If patient tolerates soft diet without nausea and vomiting and continues to pass flatus, he will be stable for discharge home from surgical standpoint.  He will follow-up with me in 2 weeks for skin staple removal   LOS: 7 days    Gabryella Murfin A Adanely Reynoso 01/04/2024  Note: Portions of this report may have been transcribed using voice recognition software. Every effort has been made to ensure accuracy; however, inadvertent computerized transcription errors may still be present.

## 2024-01-04 NOTE — Plan of Care (Signed)
  Problem: Education: Goal: Knowledge of General Education information will improve Description: Including pain rating scale, medication(s)/side effects and non-pharmacologic comfort measures Outcome: Adequate for Discharge   Problem: Clinical Measurements: Goal: Ability to maintain clinical measurements within normal limits will improve Outcome: Adequate for Discharge Goal: Diagnostic test results will improve Outcome: Adequate for Discharge

## 2024-01-04 NOTE — Progress Notes (Signed)
 Mobility Specialist Progress Note:    01/04/24 1240  Mobility  Activity Ambulated with assistance  Level of Assistance Modified independent, requires aide device or extra time  Assistive Device None  Distance Ambulated (ft) 220 ft  Range of Motion/Exercises Active;All extremities  Activity Response Tolerated well  Mobility Referral Yes  Mobility visit 1 Mobility  Mobility Specialist Start Time (ACUTE ONLY) 1240  Mobility Specialist Stop Time (ACUTE ONLY) 1300  Mobility Specialist Time Calculation (min) (ACUTE ONLY) 20 min   Pt received sitting EOB, agreeable to mobility. ModI to stand and ambulate with no AD. Tolerated well,asx throughout. Returned sitting EOB, all needs met.  Edee Nifong Mobility Specialist Please contact via Special educational needs teacher or  Rehab office at [phone]

## 2024-01-04 NOTE — TOC Transition Note (Signed)
 Transition of Care [facility]) - Discharge Note   Patient Details  Name: Kevin [name] MRN: [id number] Date of Birth: [birth year]  Transition of Care [facility]) CM/SW Contact:  Lucie Lunger, LCSWA Phone Number: 01/04/2024, 12:38 PM  Clinical Narrative:    CSW spoke with pts niece who is first contact to update of likely D/C back to [facility]F [facility], she is understanding. Rolling walker delivered to room by [employer]. HH PT/OT arranged with [facility], CSW updated Cory on plan for D/C, orders in place. CSW spoke with pts niece about transport who states she spoke with pt this morning and he has a friend that will provide his transport. TOC signing off.   Final next level of care: Home w Home Health Services Barriers to Discharge: Barriers Resolved   Patient Goals and CMS Choice Patient states their goals for this hospitalization and ongoing recovery are:: return to ILF with [facility] CMS Medicare.gov Compare Post Acute Care list provided to:: Patient Represenative (must comment) Choice offered to / list presented to : Adult Children      Discharge Placement                       Discharge Plan and Services Additional resources added to the After Visit Summary for   In-house Referral: Clinical Social Work Discharge Planning Services: CM Consult            DME Arranged: Vannie rolling   Date DME Agency Contacted: 01/01/24   Representative spoke with at DME Agency: Darlyn HH Arranged: PT, OT HH Agency: [facility] Date [facility] Agency Contacted: 01/04/24   Representative spoke with at [facility] Agency: Darleene  Social Drivers of Health (SDOH) Interventions SDOH Screenings   Food Insecurity: No Food Insecurity (12/28/2023)  Housing: Low Risk  (12/28/2023)  Transportation Needs: No Transportation Needs (12/28/2023)  Utilities: Not At Risk (12/28/2023)  Alcohol Screen: Low Risk  (09/15/2023)  Depression (PHQ2-9): Low Risk  (09/15/2023)  Financial Resource Strain: Low Risk  (08/05/2022)  Physical  Activity: Inactive (08/05/2022)  Social Connections: Socially Isolated (12/28/2023)  Stress: No Stress Concern Present (08/05/2022)  Tobacco Use: Medium Risk (12/28/2023)     Readmission Risk Interventions    12/29/2023    9:04 AM 11/04/2023    8:21 AM 09/17/2021   11:27 AM  Readmission Risk Prevention Plan  Transportation Screening Complete Complete Complete  Home Care Screening   Complete  Medication Review (RN CM)   Complete  HRI or Home Care Consult Complete Complete   Social Work Consult for Recovery Care Planning/Counseling Complete Complete   Palliative Care Screening Not Applicable Not Applicable   Medication Review Oceanographer) Complete Complete

## 2024-01-04 NOTE — Discharge Instructions (Signed)
 Surgery Discharge Instructions  Activity  You are advised to go directly home from the hospital.  Resume light activity. No heavy lifting over 10 lbs or strenuous exercise.  Fluids and Diet GI soft diet  Medications  If you have not had a bowel movement in 24 hours, take 2 tablespoons over the counter Milk of mag.             You May resume your blood thinners tomorrow (Aspirin , coumadin, or other).  You are being discharged with prescriptions for Opioid/Narcotic Medications: There are some specific considerations for these medications that you should know. Opioid Meds have risks & benefits. Addiction to these meds is always a concern with prolonged use Take medication only as directed Do not drive while taking narcotic pain medication Do not crush tablets or capsules Do not use a different container than medication was dispensed in Lock the container of medication in a cool, dry place out of reach of children and pets. Opioid medication can cause addiction Do not share with anyone else (this is a felony) Do not store medications for future use. Dispose of them properly.     Disposal:  Find a [place]  household drug take back site near you.  If you can't get to a drug take back site, use the recipe below as a last resort to dispose of expired, unused or unwanted drugs. Disposal  (Do not dispose chemotherapy drugs this way, talk to your prescribing doctor instead.) Step 1: Mix drugs (do not crush) with dirt, kitty litter, or used coffee grounds and add a small amount of water to dissolve any solid medications. Step 2: Seal drugs in plastic bag. Step 3: Place plastic bag in trash. Step 4: Take prescription container and scratch out personal information, then recycle or throw away.  Operative Site  You have skin staples in place.  These will be removed at your follow-up visit. Ok to English as a second language teacher. Keep wound clean and dry. No baths or swimming. No lifting more than 10  pounds.  Contact Information: If you have questions or concerns, please call our office, [phone], Monday- Thursday 8AM-5PM and Friday 8AM-12Noon.  If it is after hours or on the weekend, please call [facility] Main Number, [phone], and ask to speak to the surgeon on call for Dr. Evonnie at [facility].   SPECIFIC COMPLICATIONS TO WATCH FOR: Inability to urinate Fever over 101? F by mouth Nausea and vomiting lasting longer than 24 hours. Pain not relieved by medication ordered Swelling around the operative site Increased redness, warmth, hardness, around operative area Numbness, tingling, or cold fingers or toes Blood -soaked dressing, (small amounts of oozing may be normal) Increasing and progressive drainage from surgical area or exam site

## 2024-01-04 NOTE — Progress Notes (Signed)
 Mobility Specialist Progress Note:    01/04/24 0850  Mobility  Activity Ambulated with assistance  Level of Assistance Standby assist, set-up cues, supervision of patient - no hands on  Assistive Device None  Distance Ambulated (ft) 500 ft  Range of Motion/Exercises Active;All extremities  Activity Response Tolerated well  Mobility Referral Yes  Mobility visit 1 Mobility  Mobility Specialist Start Time (ACUTE ONLY) 0850  Mobility Specialist Stop Time (ACUTE ONLY) 0910  Mobility Specialist Time Calculation (min) (ACUTE ONLY) 20 min   Pt received standing EOB, agreeable to mobility. Required supervision to stand and ambulate with no AD. Tolerated well,asx throughout. Returned to room, all needs met.  Treston Coker Mobility Specialist Please contact via Special educational needs teacher or  Rehab office at [phone]

## 2024-01-05 ENCOUNTER — Encounter: Payer: Self-pay | Admitting: Surgery

## 2024-01-05 ENCOUNTER — Telehealth: Payer: Self-pay | Admitting: *Deleted

## 2024-01-05 ENCOUNTER — Ambulatory Visit (INDEPENDENT_AMBULATORY_CARE_PROVIDER_SITE_OTHER): Admitting: Surgery

## 2024-01-05 VITALS — BP 147/80 | HR 73 | Temp 98.3°F | Resp 16 | Ht 69.5 in | Wt 230.0 lb

## 2024-01-05 DIAGNOSIS — L7622 Postprocedural hemorrhage and hematoma of skin and subcutaneous tissue following other procedure: Secondary | ICD-10-CM

## 2024-01-05 NOTE — Transitions of Care (Post Inpatient/ED Visit) (Signed)
   01/05/2024  Name: DAEMIEN FRONCZAK MRN: [id number] DOB: March 14, 1946  Today's TOC FU Call Status: Today's TOC FU Call Status:: Unsuccessful Call (1st Attempt) Unsuccessful Call (1st Attempt) Date: 01/05/24  Attempted to reach the patient regarding the most recent Inpatient/ED visit.  Follow Up Plan: Additional outreach attempts will be made to reach the patient to complete the Transitions of Care (Post Inpatient/ED visit) call.   Cathlean Headland BSN RN [facility] Health Care Management Coordinator [email or web address] Direct Dial: [phone]  Fax: [phone] Website: [email or web address]

## 2024-01-05 NOTE — Progress Notes (Unsigned)
 [facility] Note   HPI:  78 y.o. Male presents to clinic for bleeding at his incision site.  He is status post exploratory laparotomy, lysis of adhesions, and primary repair of ventral hernia on 8/25 for a small bowel obstruction.  He states that last night, he started noticing bright red bleeding coming from the incision site.  He held pressure, and the area stopped.  Starting this morning, it started bleeding again, so he decided to come to the office for evaluation.  He took his Plavix  this morning.  Denies fevers and chills.  Review of Systems:  All other review of systems: otherwise negative   Vital Signs:  BP (!) 147/80   Pulse 73   Temp 98.3 F (36.8 C)   Resp 16   Ht 5' 9.5 (1.765 m)   Wt 230 lb (104.3 kg)   SpO2 97%   BMI 33.48 kg/m    Physical Exam:  Physical Exam Vitals reviewed.  Constitutional:      Appearance: Normal appearance.  Abdominal:     Comments: Abdomen soft, nondistended, no percussion tenderness, nontender to palpation; no rigidity, guarding, rebound tenderness; midline incision C/D/I with skin staples in place, old dried blood along incision site without any evidence of active bleeding  Neurological:     Mental Status: He is alert.     Laboratory studies: None  Imaging:  None  Assessment:  78 y.o. yo Male who presents for bleeding at his incision site status post exploratory laparotomy, lysis of adhesions, and primary ventral hernia repair on 8/25.  Plan:  - No evidence of bleeding at the time of evaluation.  New dressing placed and dressing change supplies given to the patient - Advised that he should hold his Plavix  through this weekend - Discussed that it is possible that he with more activity, the incision had a small area that became irritated and started bleeding.  Advised that if it begins to bleed again, he should hold pressure at that location.  Also advised that he should mark the location prior to coming back to the  office so I can further evaluate that specific spot or bleeding was noted - Follow up with me next week for evaluation for skin staple removal  All of the above recommendations were discussed with the patient, and all of patient's questions were answered to his expressed satisfaction.  Note: Portions of this report may have been transcribed using voice recognition software. Every effort has been made to ensure accuracy; however, inadvertent computerized transcription errors may still be present.   Dorothyann Brittle, DO [facility] [address] [place], [place] [address] [phone]

## 2024-01-06 ENCOUNTER — Telehealth: Payer: Self-pay

## 2024-01-06 NOTE — Transitions of Care (Post Inpatient/ED Visit) (Signed)
   01/06/2024  Name: Kevin [name] MRN: [id number] DOB: [birth year]  Today's TOC FU Call Status: Today's TOC FU Call Status:: Unsuccessful Call (2nd Attempt) Unsuccessful Call (1st Attempt) Date: 01/05/24 Unsuccessful Call (2nd Attempt) Date: 01/06/24  Attempted to reach the patient regarding the most recent Inpatient/ED visit.  Follow Up Plan: Additional outreach attempts will be made to reach the patient to complete the Transitions of Care (Post Inpatient/ED visit) call.   Medford Balboa, BSN, RN [place]  VBCI - [employer] [facility] RN Care Manager [phone]

## 2024-01-07 ENCOUNTER — Telehealth: Payer: Self-pay

## 2024-01-07 ENCOUNTER — Encounter (HOSPITAL_COMMUNITY): Payer: Self-pay

## 2024-01-07 ENCOUNTER — Emergency Department (HOSPITAL_COMMUNITY)
Admission: EM | Admit: 2024-01-07 | Discharge: 2024-01-08 | Disposition: A | Discharge status: Home / Self Care | Attending: Emergency Medicine | Admitting: Emergency Medicine

## 2024-01-07 ENCOUNTER — Other Ambulatory Visit: Payer: Self-pay

## 2024-01-07 DIAGNOSIS — R110 Nausea: Secondary | ICD-10-CM | POA: Diagnosis not present

## 2024-01-07 DIAGNOSIS — Z7982 Long term (current) use of aspirin: Secondary | ICD-10-CM | POA: Insufficient documentation

## 2024-01-07 DIAGNOSIS — R1030 Lower abdominal pain, unspecified: Secondary | ICD-10-CM | POA: Diagnosis not present

## 2024-01-07 DIAGNOSIS — R109 Unspecified abdominal pain: Secondary | ICD-10-CM

## 2024-01-07 DIAGNOSIS — Z794 Long term (current) use of insulin: Secondary | ICD-10-CM | POA: Insufficient documentation

## 2024-01-07 DIAGNOSIS — R1084 Generalized abdominal pain: Secondary | ICD-10-CM | POA: Diagnosis not present

## 2024-01-07 LAB — COMPREHENSIVE METABOLIC PANEL WITH GFR
ALT: 27 U/L (ref 0–44)
AST: 23 U/L (ref 15–41)
Albumin: 3.6 g/dL (ref 3.5–5.0)
Alkaline Phosphatase: 56 U/L (ref 38–126)
Anion gap: 13 (ref 5–15)
BUN: 31 mg/dL — ABNORMAL HIGH (ref 8–23)
CO2: 25 mmol/L (ref 22–32)
Calcium: 9.1 mg/dL (ref 8.9–10.3)
Chloride: 99 mmol/L (ref 98–111)
Creatinine, Ser: 1.59 mg/dL — ABNORMAL HIGH (ref 0.61–1.24)
GFR, Estimated: 44 mL/min — ABNORMAL LOW (ref >60)
Glucose, Bld: 115 mg/dL — ABNORMAL HIGH (ref 70–99)
Potassium: 4.3 mmol/L (ref 3.5–5.1)
Sodium: 137 mmol/L (ref 135–145)
Total Bilirubin: 0.9 mg/dL (ref 0.0–1.2)
Total Protein: 6.4 g/dL — ABNORMAL LOW (ref 6.5–8.1)

## 2024-01-07 LAB — CBC
HCT: 31.5 % — ABNORMAL LOW (ref 39.0–52.0)
Hemoglobin: 10.4 g/dL — ABNORMAL LOW (ref 13.0–17.0)
MCH: 33.3 pg (ref 26.0–34.0)
MCHC: 33 g/dL (ref 30.0–36.0)
MCV: 101 fL — ABNORMAL HIGH (ref 80.0–100.0)
Platelets: 241 K/uL (ref 150–400)
RBC: 3.12 MIL/uL — ABNORMAL LOW (ref 4.22–5.81)
RDW: 13.7 % (ref 11.5–15.5)
WBC: 8.9 K/uL (ref 4.0–10.5)
nRBC: 0 % (ref 0.0–0.2)

## 2024-01-07 LAB — LIPASE, BLOOD: Lipase: 38 U/L (ref 11–51)

## 2024-01-07 NOTE — ED Triage Notes (Signed)
 [facility]. Cc of abdominal pain that started tonight. Had abdominal surgery a week ago for bowel obstruction.  Pain started tonight after attempting to have a bm.  Last bm earlier today/

## 2024-01-07 NOTE — ED Notes (Signed)
 Pt was able to walk independently to and from the bathroom. Reports having 1 medium soft BM. Verbalized improvement of pain from 8 to 5/10 following BM. Reconnected to VS. Call bell within reach

## 2024-01-07 NOTE — Transitions of Care (Post Inpatient/ED Visit) (Signed)
   01/07/2024  Name: Kevin [name] MRN: [id number] DOB: 11/02/1945  Today's TOC FU Call Status: Today's TOC FU Call Status:: Unsuccessful Call (3rd Attempt) Unsuccessful Call (3rd Attempt) Date: 01/07/24  Attempted to reach the patient regarding the most recent Inpatient/ED visit.  Follow Up Plan: No further outreach attempts will be made at this time. We have been unable to contact the patient.  Alan Ee, RN, BSN, CEN [employer]- Transition of Care Team.  [facility] [phone]

## 2024-01-08 NOTE — ED Notes (Signed)
 Sitting in chair Denies any needs at this time

## 2024-01-08 NOTE — ED Notes (Signed)
 Report attempted to be given to facility. Facility said pt is from the independent side and report is to be given to pt/family.

## 2024-01-08 NOTE — ED Provider Notes (Signed)
 [place] EMERGENCY DEPARTMENT AT [facility] Provider Note   CSN: [id number] Arrival date & time: 01/07/24  2242     Patient presents with: Abdominal Pain   Kevin [name] is a 78 y.o. male.   Patient is a 78 year old male with history of surgery 2 weeks ago for small bowel obstruction.  Patient presenting today with complaints of abdominal pain.  He developed lower abdominal cramping just prior to presentation.  He was nauseated, but denies any vomiting.  No fevers or chills.  This feels like what he experienced with the prior bowel obstruction.       Prior to Admission medications   Medication Sig Start Date End Date Taking? Authorizing Provider  Ascorbic Acid (VITAMIN C) 1000 MG tablet Take 1,000 mg by mouth daily.    [provider]  aspirin  81 MG tablet Take 81 mg by mouth daily.    [provider]  b complex vitamins capsule Take 1 capsule by mouth daily.    [provider]  bisacodyl  (DULCOLAX) 10 MG suppository Place 1 suppository (10 mg total) rectally daily. 01/05/24   Maree, Pratik D, DO  Cholecalciferol (VITAMIN D3) 25 MCG CAPS Take 1 capsule by mouth every other day.    [provider]  clopidogrel  (PLAVIX ) 75 MG tablet TAKE ONE TABLET BY MOUTH ONCE DAILY. 04/23/23   Court Dorn PARAS, MD  Coenzyme Q10 (COQ-10) 100 MG CAPS Take 1 capsule by mouth daily.    [provider]  diphenhydrAMINE  (BENADRYL ) 25 MG tablet Take 25 mg by mouth every 6 (six) hours as needed for itching or allergies.    [provider]  Flax Oil-Fish Oil-Borage Oil (FISH OIL-FLAX OIL-BORAGE OIL) CAPS Take 3,000 mg by mouth every morning.    [provider]  fluticasone  (FLONASE ) 50 MCG/ACT nasal spray Place 2 sprays into both nostrils daily.    [provider]  JANUVIA 100 MG tablet Take 100 mg by mouth daily. 03/12/22   [provider]  levothyroxine  (SYNTHROID ) 50 MCG tablet Take 50 mcg by mouth daily before  breakfast.    [provider]  metoprolol  (TOPROL -XL) 100 MG 24 hr tablet Take 50-100 mg by mouth 2 (two) times daily. Take 1 (100 mg) tablet in the morning and 1/2 tablet (50 mg) every evening    [provider]  Misc Natural Products (TURMERIC CURCUMIN) CAPS Take 1 capsule by mouth every morning.    [provider]  Naphazoline-Pheniramine (EYE ALLERGY  RELIEF OP) Place 2 drops into both eyes as needed.    [provider]  niacin  (NIASPAN ) 1000 MG CR tablet TAKE 1 TABLET BY MOUTH AT BEDTIME. Patient taking differently: Take 1,000 mg by mouth at bedtime. 01/30/23   Court Dorn PARAS, MD  ondansetron  (ZOFRAN ) 4 MG tablet Take 1 tablet (4 mg total) by mouth every 6 (six) hours as needed for nausea. 01/04/24   Maree, Pratik D, DO  oxyCODONE  (ROXICODONE ) 5 MG immediate release tablet Take 1 tablet (5 mg total) by mouth every 8 (eight) hours as needed for moderate pain (pain score 4-6), severe pain (pain score 7-10) or breakthrough pain. 01/04/24 01/03/25  Maree, Pratik D, DO  pravastatin  (PRAVACHOL ) 40 MG tablet Take 1 tablet (40 mg total) by mouth every evening. Patient taking differently: Take 40 mg by mouth every evening. 04/18/19   Court Dorn PARAS, MD  tamsulosin  (FLOMAX ) 0.4 MG CAPS capsule Take 0.4 mg by mouth every evening. 07/17/20   [provider]  tirzepatide (MOUNJARO) 2.5 MG/0.5ML Pen Inject 2.5 mg into the skin once a week.    [provider]  valsartan -hydrochlorothiazide  (DIOVAN -HCT) 160-12.5 MG tablet Take 1 tablet by mouth daily. 12/18/23   [provider]    Allergies: Irbesartan , Prednisone , Ramipril, Shellfish allergy , Tradjenta [linagliptin], Cephalexin, Clindamycin hcl, Codeine, Contrast media [iodinated contrast media], Fenofibrate, Glycotrol [support-500], Indomethacin, Minocycline hcl, Morphine and codeine, Sulfa drugs cross reactors, and Tetracyclines & related    Review of Systems  All other systems reviewed and are  negative.   Updated Vital Signs BP 134/75   Pulse 73   Temp 97.6 F (36.4 C) (Axillary)   Resp 14   Ht 5' 9.5 (1.765 m)   Wt 104.3 kg   SpO2 100%   BMI 33.48 kg/m   Physical Exam Vitals and nursing note reviewed.  Constitutional:      General: He is not in acute distress.    Appearance: He is well-developed. He is not diaphoretic.  HENT:     Head: Normocephalic and atraumatic.  Cardiovascular:     Rate and Rhythm: Normal rate and regular rhythm.     Heart sounds: No murmur heard.    No friction rub.  Pulmonary:     Effort: Pulmonary effort is normal. No respiratory distress.     Breath sounds: Normal breath sounds. No wheezing or rales.  Abdominal:     General: Bowel sounds are normal. There is no distension.     Palpations: Abdomen is soft.     Tenderness: There is no abdominal tenderness.     Comments: There is a midline surgical incision with staples in place.  The wound appears to be healing well with no redness, erythema, or drainage.  The abdomen is soft and nontender.  Musculoskeletal:        General: Normal range of motion.     Cervical back: Normal range of motion and neck supple.  Skin:    General: Skin is warm and dry.  Neurological:     Mental Status: He is alert and oriented to person, place, and time.     Coordination: Coordination normal.     (all labs ordered are listed, but only abnormal results are displayed) Labs Reviewed  CBC - Abnormal; Notable for the following components:      Result Value   RBC 3.12 (*)    Hemoglobin 10.4 (*)    HCT 31.5 (*)    MCV 101.0 (*)    All other components within normal limits  COMPREHENSIVE METABOLIC PANEL WITH GFR - Abnormal; Notable for the following components:   Glucose, Bld 115 (*)    BUN 31 (*)    Creatinine, Ser 1.59 (*)    Total Protein 6.4 (*)    GFR, Estimated 44 (*)    All other components within normal limits  LIPASE, BLOOD  URINALYSIS, ROUTINE W REFLEX MICROSCOPIC     EKG: None  Radiology: No results found.   Procedures   Medications Ordered in the ED - No data to display                                  Medical Decision Making Amount and/or Complexity of Data Reviewed Labs: ordered.   Patient presenting with abdominal pain as described in the HPI.  Patient arrives here with stable vital signs and is afebrile.  Physical examination is basically unremarkable with benign abdomen and well hearing  surgical incision.  Laboratory studies obtained including CBC, CMP, and lipase, all of which are unremarkable.  There is no leukocytosis, no elevation of liver or pancreatic enzymes, and no significant electrolyte derangement.  Just after arriving to the ER and prior to being seen by a physician, patient experienced a bowel movement and now his symptoms have completely resolved.  He is currently without complaint.  I suspect he may have had some sort of ileus which has since resolved.  I discussed whether or not to obtain additional imaging and patient is comfortable with going home with as needed return.  His abdomen is benign, symptoms are gone, and laboratory studies are reassuring, so I feel is this is a reasonable course of action.     Final diagnoses:  None    ED Discharge Orders     None          Geroldine Berg, MD 01/08/24 [phone]

## 2024-01-08 NOTE — ED Notes (Signed)
 [facility] called for patient

## 2024-01-08 NOTE — Discharge Instructions (Signed)
 Return to the emergency department if you experience any new and/or concerning issues.

## 2024-01-11 DIAGNOSIS — K56609 Unspecified intestinal obstruction, unspecified as to partial versus complete obstruction: Secondary | ICD-10-CM | POA: Diagnosis not present

## 2024-01-11 DIAGNOSIS — E6609 Other obesity due to excess calories: Secondary | ICD-10-CM | POA: Diagnosis not present

## 2024-01-11 DIAGNOSIS — Z6835 Body mass index (BMI) 35.0-35.9, adult: Secondary | ICD-10-CM | POA: Diagnosis not present

## 2024-01-12 ENCOUNTER — Encounter: Payer: Self-pay | Admitting: Surgery

## 2024-01-12 ENCOUNTER — Ambulatory Visit
Admission: RE | Admit: 2024-01-12 | Discharge: 2024-01-12 | Disposition: A | Discharge status: Home / Self Care | Source: Ambulatory Visit | Attending: Radiation Oncology | Admitting: Radiation Oncology

## 2024-01-12 ENCOUNTER — Ambulatory Visit (INDEPENDENT_AMBULATORY_CARE_PROVIDER_SITE_OTHER): Admitting: Surgery

## 2024-01-12 VITALS — BP 128/74 | HR 72 | Temp 97.6°F | Resp 14 | Ht 69.5 in | Wt 229.0 lb

## 2024-01-12 DIAGNOSIS — S301XXA Contusion of abdominal wall, initial encounter: Secondary | ICD-10-CM

## 2024-01-12 DIAGNOSIS — Z09 Encounter for follow-up examination after completed treatment for conditions other than malignant neoplasm: Secondary | ICD-10-CM

## 2024-01-12 DIAGNOSIS — C61 Malignant neoplasm of prostate: Secondary | ICD-10-CM | POA: Insufficient documentation

## 2024-01-12 NOTE — Progress Notes (Signed)
 [facility] Note   HPI:  78 y.o. Male presents to clinic for post-op follow-up status post recent hospitalization for small bowel obstruction with need for exploratory laparotomy with lysis of adhesions and primary repair of ventral hernia on 8/25.  Since the patient was last seen by me a week ago, he has been doing well.  He has not had any drainage from his midline incision for the last 2 days.  He did present to the emergency department on 9/4 for abdominal pain, but this pain resolved after having a bowel movement in the emergency department.  No imaging was obtained during that time.  Since being seen in the emergency department, he is tolerating a diet without nausea and vomiting.  He is having regular bowel movements with his last bowel movement being today.  His biggest complaint is generalized weakness.  He denies fevers and chills.  Review of Systems:  All other review of systems: otherwise negative   Vital Signs:  BP 128/74   Pulse 72   Temp 97.6 F (36.4 C) (Oral)   Resp 14   Ht 5' 9.5 (1.765 m)   Wt 229 lb (103.9 kg)   SpO2 96%   BMI 33.33 kg/m    Physical Exam:  Physical Exam Vitals reviewed.  Constitutional:      Appearance: Normal appearance.  Abdominal:     Comments: Abdomen soft, nondistended, no percussion tenderness, nontender to palpation; no rigidity, guarding, rebound tenderness; midline incision healing well with skin staples in place, palpable underlying fluid collection with mild improving ecchymosis, likely seroma/hematoma at the location of previous ventral hernia within the subcutaneous tissues  Neurological:     Mental Status: He is alert.     Laboratory studies: None   Imaging:  None   Assessment:  78 y.o. yo Male who presents for follow-up status post recent admission for small bowel obstruction and subsequent exploratory laparotomy, lysis of adhesions, and primary repair of ventral hernia on 8/25  Plan:  - Patient overall  doing well since the surgery.  He is tolerating a diet, moving his bowels, and pain is well-controlled - Skin staples were removed and Steri-Strips were placed.  Advised that Steri-Strips will start to come off in 3 to 7 days. - We further discussed that he does have an underlying seroma versus hematoma.  If there is a small opening at his incision site, he may note of moderate amount of drainage.  Advised that if this happens, he should call my office and we will get imaging for evaluation -Patient has not restarted his Plavix  since I saw him a week ago.  Advised that he can restart his Plavix  tomorrow - Follow up with me as needed  All of the above recommendations were discussed with the patient, and all of patient's questions were answered to his expressed satisfaction.  Note: Portions of this report may have been transcribed using voice recognition software. Every effort has been made to ensure accuracy; however, inadvertent computerized transcription errors may still be present.   Dorothyann Brittle, DO [facility] [address] [place], [place] [address] [phone] (office)

## 2024-01-12 NOTE — Progress Notes (Signed)
  Radiation Oncology         [phone] ________________________________  Name: Kevin [name] MRN: [id number]  Date of Service: 01/12/2024  DOB: 1945-12-09  Post Treatment Telephone Note  Diagnosis:  78 y.o. gentleman with oligometastatic adenocarcinoma of the prostate with Gleason Score of 4+3, PSA of 15.2 and a solitary nodal metastasis in a left external iliac lymph node.   Pre Treatment IPSS Score: 15   The patient was available for call today.   Symptoms of fatigue have improved since completing therapy.  Symptoms of bladder changes have improved since completing therapy. Current symptoms include nocturia x 2-3 has improved from x 5-6, and medications for bladder symptoms include Tamsulosin  0.4 mg po in evening.  Symptoms of bowel changes have not improved since completing therapy.  Reports small bowel obstruction and had recent surgery 12/28/2023.  Reports currently doing well no complaints of pain.  Post Treatment IPSS Score: IPSS Questionnaire (AUA-7): Over the past month.   1)  How often have you had a sensation of not emptying your bladder completely after you finish urinating?  0 - Not at all  2)  How often have you had to urinate again less than two hours after you finished urinating? 1 - Less than 1 time in 5  3)  How often have you found you stopped and started again several times when you urinated?  0 - Not at all  4) How difficult have you found it to postpone urination?  0 - Not at all  5) How often have you had a weak urinary stream?  0 - Not at all  6) How often have you had to push or strain to begin urination?  0 - Not at all  7) How many times did you most typically get up to urinate from the time you went to bed until the time you got up in the morning?  3 - 3 times  Total score:  4. Which indicates mild symptoms  0-7 mildly symptomatic   8-19 moderately symptomatic   20-35 severely symptomatic   Patient has a scheduled follow up visit with his urologist,  Dr. Shane, on 02/17/2024.  He was counseled that PSA levels will be drawn in the urology office, and was reassured that additional time is expected to improve bowel and bladder symptoms. He was encouraged to call back with concerns or questions regarding radiation.

## 2024-01-13 DIAGNOSIS — Z08 Encounter for follow-up examination after completed treatment for malignant neoplasm: Secondary | ICD-10-CM | POA: Diagnosis not present

## 2024-01-13 DIAGNOSIS — L258 Unspecified contact dermatitis due to other agents: Secondary | ICD-10-CM | POA: Diagnosis not present

## 2024-01-13 DIAGNOSIS — L988 Other specified disorders of the skin and subcutaneous tissue: Secondary | ICD-10-CM | POA: Diagnosis not present

## 2024-01-13 DIAGNOSIS — D485 Neoplasm of uncertain behavior of skin: Secondary | ICD-10-CM | POA: Diagnosis not present

## 2024-01-13 DIAGNOSIS — Z85820 Personal history of malignant melanoma of skin: Secondary | ICD-10-CM | POA: Diagnosis not present

## 2024-01-18 ENCOUNTER — Ambulatory Visit: Admitting: Radiation Oncology

## 2024-01-26 ENCOUNTER — Ambulatory Visit (HOSPITAL_COMMUNITY)

## 2024-01-26 ENCOUNTER — Telehealth: Payer: Self-pay | Admitting: *Deleted

## 2024-01-26 DIAGNOSIS — S301XXA Contusion of abdominal wall, initial encounter: Secondary | ICD-10-CM

## 2024-01-26 DIAGNOSIS — Z09 Encounter for follow-up examination after completed treatment for conditions other than malignant neoplasm: Secondary | ICD-10-CM

## 2024-01-26 NOTE — Telephone Encounter (Signed)
 Surgical Date: 12/28/2023 Procedure: Laparotomy/ Lysis of Adhesions/ Hernia Repair   Received call from patient [phone]~ telephone  Patient reports increased bright red drainage from midline incision.   Patient advised to come to office for nurse to evaluate. Noted small area to proximal endo of incision where serosanguinous fluid draining. No active bleeding noted. Possible seroma noted to left of midline incision. Noted by swelling and hard knot under skin.   Patient denies fever, chills, pain.   Per provider notes, imaging to be obtained.   Order obtained for CT ABD/ Pelvis from Dr. Mavis.

## 2024-01-27 ENCOUNTER — Ambulatory Visit (HOSPITAL_COMMUNITY)
Admission: RE | Admit: 2024-01-27 | Discharge: 2024-01-27 | Disposition: A | Discharge status: Home / Self Care | Source: Ambulatory Visit | Attending: General Surgery | Admitting: General Surgery

## 2024-01-27 DIAGNOSIS — S301XXA Contusion of abdominal wall, initial encounter: Secondary | ICD-10-CM | POA: Insufficient documentation

## 2024-01-27 DIAGNOSIS — K5730 Diverticulosis of large intestine without perforation or abscess without bleeding: Secondary | ICD-10-CM | POA: Diagnosis not present

## 2024-01-27 DIAGNOSIS — Z09 Encounter for follow-up examination after completed treatment for conditions other than malignant neoplasm: Secondary | ICD-10-CM | POA: Diagnosis not present

## 2024-01-27 DIAGNOSIS — K8020 Calculus of gallbladder without cholecystitis without obstruction: Secondary | ICD-10-CM | POA: Diagnosis not present

## 2024-01-29 NOTE — Telephone Encounter (Signed)
 CT ABDOMEN AND PELVIS WITHOUT CONTRAST   IMPRESSION: 1. Postsurgical changes over the midline anterior abdominal wall compatible with recent ventral hernia repair. Focal fluid collection over the midline anterior abdominal wall incision site measuring 2.6 x 3.3 x 6.3 cm. This may represent infected versus noninfected postoperative fluid collection. 2. Moderate cholelithiasis. 3. Colonic diverticulosis without active inflammation. 4. Aortic atherosclerosis. Atherosclerotic coronary artery disease.  Discussed results with Dr. Mavis.   Fluid collection most likely seroma noted on imaging. Patient denies S/Sx of infection. Recommended follow up next week with Dr. Evonnie.   Call placed to patient and patient made aware. Appointment scheduled.

## 2024-02-03 ENCOUNTER — Other Ambulatory Visit: Payer: Self-pay | Admitting: Cardiovascular Disease

## 2024-02-03 ENCOUNTER — Telehealth: Payer: Self-pay | Admitting: *Deleted

## 2024-02-03 NOTE — Telephone Encounter (Signed)
 Surgical Date: 12/28/2023 Procedure: LAPAROTOMY, FOR LYSIS OF ADHESIONS/ PRIMARY REPAIR, HERNIA, VENTRAL   Received call from patient [phone]~ telephone.   Patient reports that drainage from midline incision has resolved. States that he can no longer see any discharge or drainage on bandage, but he is unable to determine if opening in midline incision remains. States that he does not think he needs to follow up at this time.   Patient did report that hard knot like area to left of incision remains. Advised that hematoma may not have completely drained or resolved. Advised that it is possible that drainage may return.   Patient requested to cancel appointment on 02/04/2024.  Advised to contact office if further concerns noted.

## 2024-02-04 ENCOUNTER — Encounter: Admitting: Surgery

## 2024-02-23 ENCOUNTER — Telehealth: Payer: Self-pay

## 2024-02-23 NOTE — Telephone Encounter (Signed)
 RN returning call to Kevin [name] had recently had PSA checked and wanted to know if his PSA levels will continue to go down.  RN advised that PSA level will go down slowly after radiation treatment, but to continue getting PSA checked per his urologist every couple months.  RN advised Kevin [name] that his body is now in the healing phrase of recovery and so it will take some time.  Kevin [name] was appreciative of advice and reports that his urologist did mention it will take some time for his PSA levels to go down.

## 2024-03-08 ENCOUNTER — Other Ambulatory Visit: Payer: Self-pay | Admitting: Cardiovascular Disease

## 2024-03-28 ENCOUNTER — Encounter: Payer: Self-pay | Admitting: Nurse Practitioner

## 2024-04-19 ENCOUNTER — Encounter: Payer: Self-pay | Admitting: *Deleted

## 2024-04-21 ENCOUNTER — Encounter: Payer: Self-pay | Admitting: *Deleted

## 2024-04-21 NOTE — Progress Notes (Signed)
" °  Subjective  Patient ID: Kevin [name] is a 78 y.o. male being seen for:  Chief Complaint  Patient presents with   Nasal Congestion     HPI  78 year old male with extremely dry lips and nose along with cracking and crusting worsening over the last few months.  He reports that this started approximately 1 year ago in the winter when he moved to a new residence and improved for a little while when the heat was turned off.  He has also been having some nosebleeds which are bilateral but mostly on the left side since this occurred.    Review of Systems: all relevant systems have been reviewed unless otherwise documented.   Objective  Physical Exam: General/Constitutional: Patient is a well-nourished, well-developed in no distress. Answers questions appropriately.  Skin/scalp : Normal and without lesions. No rashes, ulcerations or masses noted.  Head: No facial deformities  Eyes: Vision grossly intact. Normal extraocular movements. No nystagmus noted.  Ears: Right ear: Normal ear canal. Normal tympanic membrane. Left ear: Normal ear canal. Normal tympanic membrane.  Nose: Septum midline, turbinates slightly enlarged  Oral cavity and oropharynx: No concerning lesions in the oral cavity or pharynx.  Neck: No palpable masses or lesions  Epistaxis management  Date/Time: 04/21/2024 8:30 AM  Performed by: Arthea Maude Fries, MD Authorized by: Arthea Maude Fries, MD   Comments:      The nose was topically anesthetized with lidocaine /afrin mixture. The prominent left anterior septal vessels were cauterized with silver nitrate. No significant bleeding was noted.    Assessment/Plan  1. Epistaxis (Primary) The left anterior septum was cauterized with silver nitrate today.  2. Nasal vestibulitis He has a significant nasal vestibulitis with cracking and fissuring of the nasal vestibule.  Mupirocin ointment was prescribed today and he was encouraged to increase  humidification.   No orders of the defined types were placed in this encounter.   No follow-ups on file.   Electronically signed by: Arthea Fries, MD 04/21/2024 8:45 AM  "

## 2024-04-22 ENCOUNTER — Emergency Department (HOSPITAL_COMMUNITY)
Admission: EM | Admit: 2024-04-22 | Discharge: 2024-04-22 | Disposition: A | Discharge status: Home / Self Care | Attending: Emergency Medicine | Admitting: Emergency Medicine

## 2024-04-22 ENCOUNTER — Encounter (HOSPITAL_COMMUNITY): Payer: Self-pay

## 2024-04-22 ENCOUNTER — Other Ambulatory Visit: Payer: Self-pay

## 2024-04-22 DIAGNOSIS — Z7982 Long term (current) use of aspirin: Secondary | ICD-10-CM | POA: Diagnosis not present

## 2024-04-22 DIAGNOSIS — Z79899 Other long term (current) drug therapy: Secondary | ICD-10-CM | POA: Insufficient documentation

## 2024-04-22 DIAGNOSIS — I10 Essential (primary) hypertension: Secondary | ICD-10-CM | POA: Insufficient documentation

## 2024-04-22 DIAGNOSIS — L03211 Cellulitis of face: Secondary | ICD-10-CM

## 2024-04-22 DIAGNOSIS — E119 Type 2 diabetes mellitus without complications: Secondary | ICD-10-CM | POA: Diagnosis not present

## 2024-04-22 DIAGNOSIS — R220 Localized swelling, mass and lump, head: Secondary | ICD-10-CM | POA: Diagnosis present

## 2024-04-22 LAB — BASIC METABOLIC PANEL WITH GFR
Anion gap: 10 (ref 5–15)
BUN: 47 mg/dL — ABNORMAL HIGH (ref 8–23)
CO2: 26 mmol/L (ref 22–32)
Calcium: 9.7 mg/dL (ref 8.9–10.3)
Chloride: 105 mmol/L (ref 98–111)
Creatinine, Ser: 1.82 mg/dL — ABNORMAL HIGH (ref 0.61–1.24)
GFR, Estimated: 38 mL/min — ABNORMAL LOW
Glucose, Bld: 133 mg/dL — ABNORMAL HIGH (ref 70–99)
Potassium: 4.5 mmol/L (ref 3.5–5.1)
Sodium: 140 mmol/L (ref 135–145)

## 2024-04-22 LAB — CBC WITH DIFFERENTIAL/PLATELET
Abs Immature Granulocytes: 0.01 K/uL (ref 0.00–0.07)
Basophils Absolute: 0.1 K/uL (ref 0.0–0.1)
Basophils Relative: 2 %
Eosinophils Absolute: 0.3 K/uL (ref 0.0–0.5)
Eosinophils Relative: 8 %
HCT: 33.3 % — ABNORMAL LOW (ref 39.0–52.0)
Hemoglobin: 10.8 g/dL — ABNORMAL LOW (ref 13.0–17.0)
Immature Granulocytes: 0 %
Lymphocytes Relative: 10 %
Lymphs Abs: 0.4 K/uL — ABNORMAL LOW (ref 0.7–4.0)
MCH: 32.1 pg (ref 26.0–34.0)
MCHC: 32.4 g/dL (ref 30.0–36.0)
MCV: 99.1 fL (ref 80.0–100.0)
Monocytes Absolute: 0.4 K/uL (ref 0.1–1.0)
Monocytes Relative: 11 %
Neutro Abs: 2.8 K/uL (ref 1.7–7.7)
Neutrophils Relative %: 69 %
Platelets: 163 K/uL (ref 150–400)
RBC: 3.36 MIL/uL — ABNORMAL LOW (ref 4.22–5.81)
RDW: 13.1 % (ref 11.5–15.5)
WBC: 4 K/uL (ref 4.0–10.5)
nRBC: 0 % (ref 0.0–0.2)

## 2024-04-22 MED ORDER — SODIUM CHLORIDE 0.9 % IV SOLN
3.0000 g | Freq: Once | INTRAVENOUS | Status: AC
  Administered 2024-04-22: 3 g via INTRAVENOUS
  Filled 2024-04-22: qty 8

## 2024-04-22 MED ORDER — DIPHENHYDRAMINE HCL 50 MG/ML IJ SOLN
25.0000 mg | Freq: Once | INTRAMUSCULAR | Status: AC
  Administered 2024-04-22: 25 mg via INTRAVENOUS
  Filled 2024-04-22: qty 1

## 2024-04-22 MED ORDER — AMOXICILLIN-POT CLAVULANATE 500-125 MG PO TABS: 1.0000 | ORAL_TABLET | Freq: Three times a day (TID) | ORAL | 0 refills | Status: AC

## 2024-04-22 NOTE — ED Notes (Signed)
 ED Provider at bedside.

## 2024-04-22 NOTE — Discharge Instructions (Addendum)
 Begin taking Augmentin  as prescribed.  Follow-up with your ENT in the next few days if symptoms are not improving.  Call your ent doctor today to get an appointment next week

## 2024-04-22 NOTE — ED Provider Notes (Signed)
 "  EMERGENCY DEPARTMENT AT [facility] Provider Note   CSN: [id number] Arrival date & time: 04/22/24  9395     Patient presents with: Allergic Reaction   Kevin [name] is a 78 y.o. male.   Patient is a 78 year old male with past medical history of diabetes, obstructive sleep apnea, hypertension, hyperlipidemia, chronic renal insufficiency.  Patient presenting today with complaints of facial redness and swelling.  He went to the ENT doctor yesterday for dry and cracked mucous membranes.  He tells me that the ENT did some sort of cleanout of his nasal passages.  Since returning home, he has begun with redness and swelling to his nose and upper lip.  This extends into both cheeks.  He describes some burning and some discomfort.  No difficulty breathing or swallowing.       Prior to Admission medications  Medication Sig Start Date End Date Taking? Authorizing Provider  Ascorbic Acid (VITAMIN C) 1000 MG tablet Take 1,000 mg by mouth daily.    [provider]  aspirin  81 MG tablet Take 81 mg by mouth daily.    [provider]  b complex vitamins capsule Take 1 capsule by mouth daily.    [provider]  bisacodyl  (DULCOLAX) 10 MG suppository Place 1 suppository (10 mg total) rectally daily. 01/05/24   Maree, Pratik D, DO  Cholecalciferol (VITAMIN D3) 25 MCG CAPS Take 1 capsule by mouth every other day.    [provider]  clopidogrel  (PLAVIX ) 75 MG tablet TAKE ONE TABLET BY MOUTH ONCE DAILY. 02/03/24   Court Dorn PARAS, MD  Coenzyme Q10 (COQ-10) 100 MG CAPS Take 1 capsule by mouth daily.    [provider]  diphenhydrAMINE  (BENADRYL ) 25 MG tablet Take 25 mg by mouth every 6 (six) hours as needed for itching or allergies.    [provider]  Flax Oil-Fish Oil-Borage Oil (FISH OIL-FLAX OIL-BORAGE OIL) CAPS Take 3,000 mg by mouth every morning.    [provider]  fluticasone  (FLONASE ) 50 MCG/ACT nasal spray Place 2  sprays into both nostrils daily.    [provider]  JANUVIA 100 MG tablet Take 100 mg by mouth daily. 03/12/22   [provider]  levothyroxine  (SYNTHROID ) 50 MCG tablet Take 50 mcg by mouth daily before breakfast.    [provider]  metoprolol  (TOPROL -XL) 100 MG 24 hr tablet Take 50-100 mg by mouth 2 (two) times daily. Take 1 (100 mg) tablet in the morning and 1/2 tablet (50 mg) every evening    [provider]  Misc Natural Products (TURMERIC CURCUMIN) CAPS Take 1 capsule by mouth every morning.    [provider]  Naphazoline-Pheniramine (EYE ALLERGY  RELIEF OP) Place 2 drops into both eyes as needed.    [provider]  niacin  (NIASPAN ) 1000 MG CR tablet TAKE 1 TABLET BY MOUTH AT BEDTIME. 03/09/24   Court Dorn PARAS, MD  ondansetron  (ZOFRAN ) 4 MG tablet Take 1 tablet (4 mg total) by mouth every 6 (six) hours as needed for nausea. 01/04/24   Maree, Pratik D, DO  pravastatin  (PRAVACHOL ) 40 MG tablet Take 1 tablet (40 mg total) by mouth every evening. Patient taking differently: Take 40 mg by mouth every evening. 04/18/19   Court Dorn PARAS, MD  tamsulosin  (FLOMAX ) 0.4 MG CAPS capsule Take 0.4 mg by mouth every evening. 07/17/20   [provider]  tirzepatide CLOYDE) 2.5 MG/0.5ML Pen Inject 2.5 mg into the skin once a week.  [provider]  valsartan -hydrochlorothiazide  (DIOVAN -HCT) 160-12.5 MG tablet Take 1 tablet by mouth daily. 12/18/23   [provider]    Allergies: Irbesartan , Prednisone , Ramipril, Shellfish allergy , Tradjenta [linagliptin], Cephalexin, Clindamycin hcl, Codeine, Contrast media [iodinated contrast media], Fenofibrate, Glycotrol [support-500], Indomethacin, Minocycline hcl, Morphine and codeine, Sulfa drugs cross reactors, and Tetracyclines & related    Review of Systems  All other systems reviewed and are negative.   Updated Vital Signs BP 130/69 (BP Location: Left Arm)   Pulse 65   Temp  (!) 97.4 F (36.3 C)   Resp 17   SpO2 100%   Physical Exam Vitals and nursing note reviewed.  HENT:     Head:     Comments: There is erythema, warmth, and weepage of yellow fluid noted to the nose, upper lip, and both cheeks.    Nose:     Comments: The inside of the nares appeared clear.  There is no bleeding.  Septum is midline. Musculoskeletal:     Cervical back: Normal range of motion.  Skin:    General: Skin is warm and dry.  Neurological:     Mental Status: He is alert and oriented to person, place, and time.     (all labs ordered are listed, but only abnormal results are displayed) Labs Reviewed - No data to display  EKG: None  Radiology: No results found.   Procedures   Medications Ordered in the ED  Ampicillin-Sulbactam (UNASYN) 3 g in sodium chloride  0.9 % 100 mL IVPB (has no administration in time range)  diphenhydrAMINE  (BENADRYL ) injection 25 mg (has no administration in time range)                                    Medical Decision Making Risk Prescription drug management.   The appearance of the skin of the nose, upper lip, and cheeks is concerning for cellulitis.  Patient has multiple allergies including cephalexin, but no listed allergy  to penicillins.  I will administer Unasyn and likely discharge with Augmentin .  Patient will require some observation and care will be signed out to oncoming provider at shift change.     Final diagnoses:  None    ED Discharge Orders     None          Geroldine Berg, MD 04/22/24 [phone]  "

## 2024-04-22 NOTE — ED Triage Notes (Signed)
 Pov from home cc of worsening allergic reaction since yesterday morning. Took 25mg  benadryl  at 10pm. Says this morning his lips are swelling.

## 2024-04-23 ENCOUNTER — Other Ambulatory Visit: Payer: Self-pay

## 2024-04-23 ENCOUNTER — Encounter (HOSPITAL_COMMUNITY): Payer: Self-pay

## 2024-04-23 ENCOUNTER — Emergency Department (HOSPITAL_COMMUNITY)
Admission: EM | Admit: 2024-04-23 | Discharge: 2024-04-23 | Disposition: A | Discharge status: Home / Self Care | Attending: Emergency Medicine | Admitting: Emergency Medicine

## 2024-04-23 DIAGNOSIS — Z794 Long term (current) use of insulin: Secondary | ICD-10-CM | POA: Diagnosis not present

## 2024-04-23 DIAGNOSIS — E1122 Type 2 diabetes mellitus with diabetic chronic kidney disease: Secondary | ICD-10-CM | POA: Insufficient documentation

## 2024-04-23 DIAGNOSIS — L299 Pruritus, unspecified: Secondary | ICD-10-CM | POA: Diagnosis not present

## 2024-04-23 DIAGNOSIS — N189 Chronic kidney disease, unspecified: Secondary | ICD-10-CM | POA: Diagnosis not present

## 2024-04-23 DIAGNOSIS — Z7902 Long term (current) use of antithrombotics/antiplatelets: Secondary | ICD-10-CM | POA: Diagnosis not present

## 2024-04-23 DIAGNOSIS — L03211 Cellulitis of face: Secondary | ICD-10-CM

## 2024-04-23 DIAGNOSIS — Z7982 Long term (current) use of aspirin: Secondary | ICD-10-CM | POA: Insufficient documentation

## 2024-04-23 DIAGNOSIS — I2510 Atherosclerotic heart disease of native coronary artery without angina pectoris: Secondary | ICD-10-CM | POA: Diagnosis not present

## 2024-04-23 DIAGNOSIS — R220 Localized swelling, mass and lump, head: Secondary | ICD-10-CM | POA: Diagnosis present

## 2024-04-23 MED ORDER — LORATADINE 10 MG PO TABS: 10.0000 mg | ORAL_TABLET | Freq: Once | ORAL | Status: DC

## 2024-04-23 MED ORDER — CETIRIZINE HCL 5 MG PO TABS: 5.0000 mg | ORAL_TABLET | Freq: Every day | ORAL | 0 refills | Status: AC

## 2024-04-23 NOTE — ED Notes (Signed)
 Celeste PA gave oral d/c instructions.

## 2024-04-23 NOTE — ED Provider Notes (Signed)
 " [place] EMERGENCY DEPARTMENT AT [facility] Provider Note   CSN: [id number] Arrival date & time: 04/23/24  1528     Patient presents with: Allergic Reaction   Kevin [name] is a 78 y.o. male.  He has history of CKD, sleep apnea, CAD, diabetes, aortic atherosclerosis, BPH.  Presents to ER today for evaluation of rash to his face.  He is he has redness to his cheeks and nose and some crusting around his nostrils.  He states he had this ongoing for a while but it seems to be getting worse.  He was seen initially by ENT on 12/18 and told his dry skin.  Per record review they diagnosed him with nasal vestibulitis and gave him mupirocin ointment and recommend increasing his humidification.  Patient states that the mupirocin seems to be burning his skin.  He presented to the ED and was seen yesterday morning around 6 AM for the same, felt to possibly have some facial cellulitis so was also prescribed Augmentin  after taking Unasyn  IV while here.  He states today he had some itching on his legs and on his right wrist and was worried he could have an allergic reaction to the antibiotic.  He has no other rashes, no swallowing or breathing difficulties, no swelling of his lips, throat or tongue.  He has tried putting Vaseline on his skin as well and states this is also painful..     Allergic Reaction      Prior to Admission medications  Medication Sig Start Date End Date Taking? Authorizing Provider  amoxicillin -clavulanate (AUGMENTIN ) 500-125 MG tablet Take 1 tablet by mouth every 8 (eight) hours. 04/22/24   Geroldine Berg, MD  Ascorbic Acid (VITAMIN C) 1000 MG tablet Take 1,000 mg by mouth daily.    [provider]  aspirin  81 MG tablet Take 81 mg by mouth daily.    [provider]  b complex vitamins capsule Take 1 capsule by mouth daily.    [provider]  bisacodyl  (DULCOLAX) 10 MG suppository Place 1 suppository (10 mg total) rectally daily. 01/05/24    Maree, Pratik D, DO  Cholecalciferol (VITAMIN D3) 25 MCG CAPS Take 1 capsule by mouth every other day.    [provider]  clopidogrel  (PLAVIX ) 75 MG tablet TAKE ONE TABLET BY MOUTH ONCE DAILY. 02/03/24   Court Dorn PARAS, MD  Coenzyme Q10 (COQ-10) 100 MG CAPS Take 1 capsule by mouth daily.    [provider]  diphenhydrAMINE  (BENADRYL ) 25 MG tablet Take 25 mg by mouth every 6 (six) hours as needed for itching or allergies.    [provider]  Flax Oil-Fish Oil-Borage Oil (FISH OIL-FLAX OIL-BORAGE OIL) CAPS Take 3,000 mg by mouth every morning.    [provider]  fluticasone  (FLONASE ) 50 MCG/ACT nasal spray Place 2 sprays into both nostrils daily.    [provider]  JANUVIA 100 MG tablet Take 100 mg by mouth daily. 03/12/22   [provider]  levothyroxine  (SYNTHROID ) 50 MCG tablet Take 50 mcg by mouth daily before breakfast.    [provider]  metoprolol  (TOPROL -XL) 100 MG 24 hr tablet Take 50-100 mg by mouth 2 (two) times daily. Take 1 (100 mg) tablet in the morning and 1/2 tablet (50 mg) every evening    [provider]  Misc Natural Products (TURMERIC CURCUMIN) CAPS Take 1 capsule by mouth every morning.    [provider]  Naphazoline-Pheniramine (EYE ALLERGY  RELIEF OP) Place 2  drops into both eyes as needed.    [provider]  niacin  (NIASPAN ) 1000 MG CR tablet TAKE 1 TABLET BY MOUTH AT BEDTIME. 03/09/24   Court Dorn PARAS, MD  ondansetron  (ZOFRAN ) 4 MG tablet Take 1 tablet (4 mg total) by mouth every 6 (six) hours as needed for nausea. 01/04/24   Maree, Pratik D, DO  pravastatin  (PRAVACHOL ) 40 MG tablet Take 1 tablet (40 mg total) by mouth every evening. Patient taking differently: Take 40 mg by mouth every evening. 04/18/19   Court Dorn PARAS, MD  tamsulosin  (FLOMAX ) 0.4 MG CAPS capsule Take 0.4 mg by mouth every evening. 07/17/20   [provider]  tirzepatide CLOYDE) 2.5 MG/0.5ML Pen Inject  2.5 mg into the skin once a week.    [provider]  valsartan -hydrochlorothiazide  (DIOVAN -HCT) 160-12.5 MG tablet Take 1 tablet by mouth daily. 12/18/23   [provider]    Allergies: Irbesartan , Prednisone , Ramipril, Shellfish allergy , Tradjenta [linagliptin], Cephalexin, Clindamycin hcl, Codeine, Contrast media [iodinated contrast media], Fenofibrate, Glycotrol [support-500], Indomethacin, Minocycline hcl, Morphine and codeine, Sulfa drugs cross reactors, and Tetracyclines & related    Review of Systems  Updated Vital Signs BP (!) 104/54 (BP Location: Right Arm)   Pulse 71   Temp 97.9 F (36.6 C)   Resp 16   Wt 103.9 kg   SpO2 97%   BMI 33.34 kg/m   Physical Exam Vitals and nursing note reviewed.  Constitutional:      General: He is not in acute distress.    Appearance: He is well-developed.  HENT:     Head: Normocephalic and atraumatic.  Eyes:     Conjunctiva/sclera: Conjunctivae normal.  Cardiovascular:     Rate and Rhythm: Normal rate and regular rhythm.     Heart sounds: No murmur heard. Pulmonary:     Effort: Pulmonary effort is normal. No respiratory distress.     Breath sounds: Normal breath sounds.  Abdominal:     Palpations: Abdomen is soft.     Tenderness: There is no abdominal tenderness.  Musculoskeletal:        General: No swelling.     Cervical back: Neck supple.  Skin:    General: Skin is warm and dry.     Capillary Refill: Capillary refill takes less than 2 seconds.     Comments: There is mild erythema to bilateral cheeks and the tip of the nose, bilateral nostrils have some yellow crusted drainage bilaterally.  Neurological:     General: No focal deficit present.     Mental Status: He is alert and oriented to person, place, and time.  Psychiatric:        Mood and Affect: Mood normal.     (all labs ordered are listed, but only abnormal results are displayed) Labs Reviewed - No data to display  EKG: None  Radiology: No  results found.   Procedures   Medications Ordered in the ED  loratadine  (CLARITIN ) tablet 10 mg (has no administration in time range)                                    Medical Decision Making Differential diagnosis includes but is not limited to allergic reaction, cellulitis, nasal vestibulitis, erysipelas, other ED course: Patient presents ER today for evaluation of redness to his face and nose.  ENT felt this was nasal vestibulitis and dry skin, patient states that Vaseline and mupirocin  are burning his skin.  We discussed that this is due to irritation and should improve with treatment.  Spit out some possible facial cellulitis by ED physician and started on Augmentin .  Patient thinks he could be allergic to this, he has no rash, shortness of breath, wheezing, nausea vomiting or diarrhea.  He just has some itchiness to anterior bilateral legs and right wrist that is intermittent.  There is no overlying rash.  Patient's skin is overall very dry, discussed I do not feel that this is truly an allergy , he does take frequent hot showers, lives in an assisted living where he cannot control the heat and states that the area is very dry, has been having this issue on and off for the past year.  We discussed using Mehta fire, lukewarm showers, pat skin dry and use moisturizer daily.  Risk OTC drugs.        Final diagnoses:  None    ED Discharge Orders     None          Suellen Sherran LABOR, PA-C 04/23/24 1726  "

## 2024-04-23 NOTE — ED Triage Notes (Signed)
 Pt reports he was seen yesterday thinking he was having an allergic reaction after a procedure at the ENT.  Pt reports he was told yesterday that they thought it was facial cellulitis and he was placed on abx and now is starting to itch.  Pt reports he does not see a rash yet but somebody needs to figure out what this crap is on my face cause I can't stand it.

## 2024-04-23 NOTE — Discharge Instructions (Signed)
 You antibiotics, avoid hot showers, use lotion daily, continue using the topical mupirocin and moisturizers.  Obtain a humidifier to place in the room.

## 2024-05-12 ENCOUNTER — Other Ambulatory Visit: Payer: Self-pay

## 2024-05-12 ENCOUNTER — Emergency Department (HOSPITAL_COMMUNITY)

## 2024-05-12 ENCOUNTER — Emergency Department (HOSPITAL_COMMUNITY)
Admission: EM | Admit: 2024-05-12 | Discharge: 2024-05-12 | Disposition: A | Discharge status: Home / Self Care | Attending: Emergency Medicine | Admitting: Emergency Medicine

## 2024-05-12 ENCOUNTER — Encounter (HOSPITAL_COMMUNITY): Payer: Self-pay | Admitting: Emergency Medicine

## 2024-05-12 DIAGNOSIS — R42 Dizziness and giddiness: Secondary | ICD-10-CM | POA: Insufficient documentation

## 2024-05-12 DIAGNOSIS — E1122 Type 2 diabetes mellitus with diabetic chronic kidney disease: Secondary | ICD-10-CM | POA: Insufficient documentation

## 2024-05-12 DIAGNOSIS — Z79899 Other long term (current) drug therapy: Secondary | ICD-10-CM | POA: Diagnosis not present

## 2024-05-12 DIAGNOSIS — R7989 Other specified abnormal findings of blood chemistry: Secondary | ICD-10-CM | POA: Diagnosis not present

## 2024-05-12 DIAGNOSIS — I509 Heart failure, unspecified: Secondary | ICD-10-CM | POA: Diagnosis not present

## 2024-05-12 DIAGNOSIS — N189 Chronic kidney disease, unspecified: Secondary | ICD-10-CM | POA: Insufficient documentation

## 2024-05-12 DIAGNOSIS — Z794 Long term (current) use of insulin: Secondary | ICD-10-CM | POA: Insufficient documentation

## 2024-05-12 DIAGNOSIS — Z7902 Long term (current) use of antithrombotics/antiplatelets: Secondary | ICD-10-CM | POA: Diagnosis not present

## 2024-05-12 DIAGNOSIS — I2510 Atherosclerotic heart disease of native coronary artery without angina pectoris: Secondary | ICD-10-CM | POA: Insufficient documentation

## 2024-05-12 DIAGNOSIS — Z7982 Long term (current) use of aspirin: Secondary | ICD-10-CM | POA: Insufficient documentation

## 2024-05-12 DIAGNOSIS — I130 Hypertensive heart and chronic kidney disease with heart failure and stage 1 through stage 4 chronic kidney disease, or unspecified chronic kidney disease: Secondary | ICD-10-CM | POA: Diagnosis not present

## 2024-05-12 LAB — CBC WITH DIFFERENTIAL/PLATELET
Abs Immature Granulocytes: 0.02 K/uL (ref 0.00–0.07)
Basophils Absolute: 0 K/uL (ref 0.0–0.1)
Basophils Relative: 1 %
Eosinophils Absolute: 0.2 K/uL (ref 0.0–0.5)
Eosinophils Relative: 5 %
HCT: 31 % — ABNORMAL LOW (ref 39.0–52.0)
Hemoglobin: 10.3 g/dL — ABNORMAL LOW (ref 13.0–17.0)
Immature Granulocytes: 1 %
Lymphocytes Relative: 10 %
Lymphs Abs: 0.4 K/uL — ABNORMAL LOW (ref 0.7–4.0)
MCH: 32.6 pg (ref 26.0–34.0)
MCHC: 33.2 g/dL (ref 30.0–36.0)
MCV: 98.1 fL (ref 80.0–100.0)
Monocytes Absolute: 0.3 K/uL (ref 0.1–1.0)
Monocytes Relative: 9 %
Neutro Abs: 2.7 K/uL (ref 1.7–7.7)
Neutrophils Relative %: 74 %
Platelets: 117 K/uL — ABNORMAL LOW (ref 150–400)
RBC: 3.16 MIL/uL — ABNORMAL LOW (ref 4.22–5.81)
RDW: 13.1 % (ref 11.5–15.5)
WBC: 3.6 K/uL — ABNORMAL LOW (ref 4.0–10.5)
nRBC: 0 % (ref 0.0–0.2)

## 2024-05-12 LAB — BASIC METABOLIC PANEL WITH GFR
Anion gap: 13 (ref 5–15)
BUN: 39 mg/dL — ABNORMAL HIGH (ref 8–23)
CO2: 20 mmol/L — ABNORMAL LOW (ref 22–32)
Calcium: 9.5 mg/dL (ref 8.9–10.3)
Chloride: 105 mmol/L (ref 98–111)
Creatinine, Ser: 1.55 mg/dL — ABNORMAL HIGH (ref 0.61–1.24)
GFR, Estimated: 46 mL/min — ABNORMAL LOW
Glucose, Bld: 176 mg/dL — ABNORMAL HIGH (ref 70–99)
Potassium: 4.5 mmol/L (ref 3.5–5.1)
Sodium: 138 mmol/L (ref 135–145)

## 2024-05-12 LAB — TROPONIN T, HIGH SENSITIVITY
Troponin T High Sensitivity: 49 ng/L — ABNORMAL HIGH (ref 0–19)
Troponin T High Sensitivity: 48 ng/L — ABNORMAL HIGH (ref 0–19)

## 2024-05-12 MED ORDER — MECLIZINE HCL 12.5 MG PO TABS: 12.5000 mg | ORAL_TABLET | Freq: Three times a day (TID) | ORAL | 0 refills | Status: AC | PRN

## 2024-05-12 MED ORDER — IOHEXOL 350 MG/ML SOLN
60.0000 mL | Freq: Once | INTRAVENOUS | Status: AC | PRN
  Administered 2024-05-12: 60 mL via INTRAVENOUS

## 2024-05-12 MED ORDER — DIPHENHYDRAMINE HCL 50 MG/ML IJ SOLN
50.0000 mg | Freq: Once | INTRAMUSCULAR | Status: AC
  Administered 2024-05-12: 50 mg via INTRAVENOUS

## 2024-05-12 MED ORDER — DIPHENHYDRAMINE HCL 50 MG/ML IJ SOLN
50.0000 mg | Freq: Once | INTRAMUSCULAR | Status: DC
  Filled 2024-05-12: qty 1

## 2024-05-12 MED ORDER — DIPHENHYDRAMINE HCL 25 MG PO CAPS: 50.0000 mg | ORAL_CAPSULE | Freq: Once | ORAL | Status: DC

## 2024-05-12 MED ORDER — DIPHENHYDRAMINE HCL 25 MG PO CAPS: 50.0000 mg | ORAL_CAPSULE | Freq: Once | ORAL | Status: AC

## 2024-05-12 NOTE — ED Triage Notes (Signed)
 Pt bib [facility] from home for a near syncopal episode / dizziness. Pt states he was in his bathroom when the room started to sping lasting 3-4 minutes. C/o of nausea and stomach discomfort. Pt receive 4mg  zofran  w/ ems

## 2024-05-12 NOTE — Discharge Instructions (Addendum)
 You were seen for your vertigo in the emergency department.   At home, please take the meclizine  for this.    Check your MyChart online for the results of any tests that had not resulted by the time you left the emergency department.   Follow-up with your primary doctor in 2-3 days regarding your visit.  Follow-up with the ENT about your vertigo. Follow-up with your cardiologist about your elevated cardiac enzymes.   Return immediately to the emergency department if you experience any of the following: worsening dizziness, chest pain, or any other concerning symptoms.    Thank you for visiting our Emergency Department. It was a pleasure taking care of you today.

## 2024-05-12 NOTE — ED Provider Notes (Signed)
 " [place] [facility]MENT AT [facility] Provider Note   CSN: [id number] Arrival date & time: 05/12/24  9163     Patient presents with: Dizziness   Kevin [name] is a 79 y.o. male.   79 year old male history of CAD status post PCI, CHF, CKD, hypertension, diabetes, and hyperlipidemia who presents to the emergency department with dizziness.  Patient had an episode of vertigo lasting 3 to 4 minutes at 7:30 AM this morning when he was shaving.  Spontaneously resolved.  Felt very nauseous when it happened.  Has never had this happen before.  No chest pain or shortness of breath.  Has chronic tinnitus without any changes recently.  No new hearing loss that he is aware of.  No numbness or weakness of his arms or legs       Prior to Admission medications  Medication Sig Start Date End Date Taking? Authorizing Provider  meclizine  (ANTIVERT ) 12.5 MG tablet Take 1 tablet (12.5 mg total) by mouth 3 (three) times daily as needed for dizziness. 05/12/24  Yes Yolande Lamar BROCKS, MD  amoxicillin -clavulanate (AUGMENTIN ) 500-125 MG tablet Take 1 tablet by mouth every 8 (eight) hours. 04/22/24   Geroldine Berg, MD  Ascorbic Acid (VITAMIN C) 1000 MG tablet Take 1,000 mg by mouth daily.    [provider]  aspirin  81 MG tablet Take 81 mg by mouth daily.    [provider]  b complex vitamins capsule Take 1 capsule by mouth daily.    [provider]  bisacodyl  (DULCOLAX) 10 MG suppository Place 1 suppository (10 mg total) rectally daily. 01/05/24   Maree, Pratik D, DO  cetirizine  (ZYRTEC ) 5 MG tablet Take 1 tablet (5 mg total) by mouth daily. 04/23/24   Suellen Cantor A, PA-C  Cholecalciferol (VITAMIN D3) 25 MCG CAPS Take 1 capsule by mouth every other day.    [provider]  clopidogrel  (PLAVIX ) 75 MG tablet TAKE ONE TABLET BY MOUTH ONCE DAILY. 02/03/24   Court Dorn PARAS, MD  Coenzyme Q10 (COQ-10) 100 MG CAPS Take 1 capsule by mouth daily.    [provider]  diphenhydrAMINE  (BENADRYL ) 25 MG tablet Take 25 mg by mouth every 6 (six) hours as needed for itching or allergies.    [provider]  Flax Oil-Fish Oil-Borage Oil (FISH OIL-FLAX OIL-BORAGE OIL) CAPS Take 3,000 mg by mouth every morning.    [provider]  fluticasone  (FLONASE ) 50 MCG/ACT nasal spray Place 2 sprays into both nostrils daily.    [provider]  JANUVIA 100 MG tablet Take 100 mg by mouth daily. 03/12/22   [provider]  levothyroxine  (SYNTHROID ) 50 MCG tablet Take 50 mcg by mouth daily before breakfast.    [provider]  metoprolol  (TOPROL -XL) 100 MG 24 hr tablet Take 50-100 mg by mouth 2 (two) times daily. Take 1 (100 mg) tablet in the morning and 1/2 tablet (50 mg) every evening    [provider]  Misc Natural Products (TURMERIC CURCUMIN) CAPS Take 1 capsule by mouth every morning.    [provider]  Naphazoline-Pheniramine (EYE ALLERGY  RELIEF OP) Place 2 drops into both eyes as needed.    [provider]  niacin  (NIASPAN ) 1000 MG CR tablet TAKE 1 TABLET BY MOUTH AT BEDTIME. 03/09/24   Court Dorn PARAS, MD  ondansetron  (ZOFRAN ) 4 MG tablet Take 1 tablet (4 mg total) by mouth every 6 (six) hours as needed for nausea. 01/04/24   Maree Bracken D,  DO  pravastatin  (PRAVACHOL ) 40 MG tablet Take 1 tablet (40 mg total) by mouth every evening. Patient taking differently: Take 40 mg by mouth every evening. 04/18/19   Court Dorn PARAS, MD  tamsulosin  (FLOMAX ) 0.4 MG CAPS capsule Take 0.4 mg by mouth every evening. 07/17/20   [provider]  tirzepatide CLOYDE) 2.5 MG/0.5ML Pen Inject 2.5 mg into the skin once a week.    [provider]  valsartan -hydrochlorothiazide  (DIOVAN -HCT) 160-12.5 MG tablet Take 1 tablet by mouth daily. 12/18/23   [provider]    Allergies: Irbesartan , Prednisone , Ramipril, Shellfish allergy , Tradjenta [linagliptin], Cephalexin, Clindamycin hcl,  Codeine, Contrast media [iodinated contrast media], Fenofibrate, Glycotrol [support-500], Indomethacin, Minocycline hcl, Morphine and codeine, Sulfa drugs cross reactors, and Tetracyclines & related    Review of Systems  Updated Vital Signs BP 130/70 (BP Location: Right Arm)   Pulse (!) 58   Temp 97.8 F (36.6 C) (Oral)   Resp 18   Ht 5' 9.5 (1.765 m)   Wt 108.9 kg   SpO2 98%   BMI 34.93 kg/m   Physical Exam Vitals and nursing note reviewed.  Constitutional:      General: He is not in acute distress.    Appearance: He is well-developed.  HENT:     Head: Normocephalic and atraumatic.     Right Ear: External ear normal.     Left Ear: External ear normal.     Nose: Nose normal.  Eyes:     Extraocular Movements: Extraocular movements intact.     Conjunctiva/sclera: Conjunctivae normal.     Pupils: Pupils are equal, round, and reactive to light.  Cardiovascular:     Rate and Rhythm: Normal rate and regular rhythm.     Heart sounds: Normal heart sounds.  Pulmonary:     Effort: Pulmonary effort is normal. No respiratory distress.     Breath sounds: Normal breath sounds.  Musculoskeletal:     Cervical back: Normal range of motion and neck supple.     Right lower leg: No edema.     Left lower leg: No edema.  Skin:    General: Skin is warm and dry.  Neurological:     Mental Status: He is alert. Mental status is at baseline.     Comments: NIHSS Exam  Level of Consciousness: Alert  LOC Questions: Answers Month and Age Correctly  LOC Commands: Opens and Closes Eyes and Hands on command  Best Gaze: Horizontal ocular movements intact  Visual Fields: No visual field loss  Facial Palsy: None  L Upper Extremity Motor: No drift after 10 seconds  R Upper Extremity Motor: No drift after 10 seconds  L Lower extremity Motor: No drift after 5 seconds  R Lower extremity Motor: No drift after 5 seconds  Ataxia: Absent  Sensory: Intact sensation to light touch on face, arms, trunk, and  legs bilaterally  Best Language: No aphasia  Dysarthria: No dysarthria  Neglect: No visual or sensory neglect   Psychiatric:        Mood and Affect: Mood normal.        Behavior: Behavior normal.     (all labs ordered are listed, but only abnormal results are displayed) Labs Reviewed  CBC WITH DIFFERENTIAL/PLATELET - Abnormal; Notable for the following components:      Result Value   WBC 3.6 (*)    RBC 3.16 (*)    Hemoglobin 10.3 (*)    HCT 31.0 (*)    Platelets 117 (*)  Lymphs Abs 0.4 (*)    All other components within normal limits  BASIC METABOLIC PANEL WITH GFR - Abnormal; Notable for the following components:   CO2 20 (*)    Glucose, Bld 176 (*)    BUN 39 (*)    Creatinine, Ser 1.55 (*)    GFR, Estimated 46 (*)    All other components within normal limits  TROPONIN T, HIGH SENSITIVITY - Abnormal; Notable for the following components:   Troponin T High Sensitivity 49 (*)    All other components within normal limits  TROPONIN T, HIGH SENSITIVITY - Abnormal; Notable for the following components:   Troponin T High Sensitivity 48 (*)    All other components within normal limits    EKG: EKG Interpretation Date/Time:  Thursday May 12 2024 08:46:26 EST Ventricular Rate:  57 PR Interval:    QRS Duration:  108 QT Interval:  435 QTC Calculation: 424 R Axis:   14  Text Interpretation: Normal sinus rhythm Confirmed by Yolande Charleston [phone]) on 05/12/2024 9:48:37 AM  Radiology: CT ANGIO HEAD NECK W WO CM Result Date: 05/12/2024 EXAM: CTA Head and Neck with Intravenous Contrast. CT Head without Contrast. CLINICAL HISTORY: 79 year old male with vertigo, acute onset with near syncope, and dizziness, confusion, nausea, and/or speech changes. TECHNIQUE: Axial CTA images of the head and neck performed with intravenous contrast. MIP reconstructed images were created and reviewed. Axial computed tomography images of the head/brain performed without intravenous contrast. Note:  Per PQRS, the description of internal carotid artery percent stenosis, including 0 percent or normal exam, is based on [employer] American Symptomatic Carotid Endarterectomy Trial (NASCET) criteria. Dose reduction technique was used including one or more of the following: automated exposure control, adjustment of mA and kV according to patient size, and/or iterative reconstruction. CONTRAST: With; COMPARISON: Carotid Doppler Ultrasound 10/14/2013. FINDINGS: CT HEAD: BRAIN: No acute intraparenchymal hemorrhage. No mass lesion. No CT evidence for acute territorial infarct. No midline shift or extra-axial collection. Mild leftward gaze deviation, nonspecific. Brain volume at the lower limits of normal for age. Normal gray white differentiation for age. VENTRICLES: No hydrocephalus. ORBITS: The orbits are unremarkable. SINUSES AND MASTOIDS: Paranasal sinuses, tympanic cavities and mastoids well aerated. CTA NECK: Normal 3 vessel arch configuration, mild to moderate soft and calcified arch atherosclerosis. Minimal brachiocephalic artery atherosclerosis without stenosis. Right subclavian artery origin calcified plaque without stenosis. Proximal left subclavian artery mostly soft plaque without stenosis COMMON CAROTID ARTERIES: Minimal right carotid bifurcation atherosclerosis without stenosis. Minimal left CCA atherosclerosis without stenosis. Right ICA siphon mild for age calcified plaque without stenosis. Soft and calcified plaque at the left ICA bulb with less than 50% stenosis. Left ICA siphon mild calcified plaque without stenosis. No dissection or occlusion. VERTEBRAL ARTERIES: Nondominant right vertebral artery is patent and within normal limits to the vertebrobasilar junction. Dominant left vertebral artery is mildly ectatic to the skull base with no significant plaque or stenosis. Mild left V4 segment calcified plaque without stenosis. No dissection or occlusion. Dominant left AICA, normal variant. Right AICA appears  to be dominant, normal variant. OTHER: Advanced chronic cervical spine degeneration, especially disc and endplate degeneration. CTA HEAD: ANTERIOR CEREBRAL ARTERIES: Normal ACA origins. Diminutive anterior communicating artery. No significant stenosis. No occlusion. No aneurysm. MIDDLE CEREBRAL ARTERIES: Normal MCA origins. No significant stenosis. No occlusion. No aneurysm. POSTERIOR CEREBRAL ARTERIES: Normal PCA origins. Diminutive or absent posterior communicating arteries. No significant stenosis. No occlusion. No aneurysm. BASILAR ARTERY: No significant stenosis. No occlusion. No aneurysm. Normal  SCA origins. INTRACRANIAL VENOUS: Symmetric cavernous sinus enhancement appears to be physiologic. Major dural venous sinuses are enhancing and patent. OTHER: Negative visible upper chest. SOFT TISSUES: No acute finding. No masses or lymphadenopathy. Nonvascular neck soft tissue spaces are within normal limits. BONES: No acute osseous abnormality. Advanced chronic cervical spine degeneration, especially disc and endplate degeneration. IMPRESSION: 1. No acute intracranial abnormality. 2. Atherosclerosis, but mild for age in the head and neck. No hemodynamically significant stenosis. Electronically signed by: Helayne Hurst MD MD 05/12/2024 11:19 AM EST RP Workstation: [id number]     Procedures   Medications Ordered in the ED  diphenhydrAMINE  (BENADRYL ) capsule 50 mg ( Oral See Alternative 05/12/24 0945)    Or  diphenhydrAMINE  (BENADRYL ) injection 50 mg (50 mg Intravenous Given 05/12/24 0945)  iohexol  (OMNIPAQUE ) 350 MG/ML injection 60 mL (60 mLs Intravenous Contrast Given 05/12/24 1050)                                    Medical Decision Making Amount and/or Complexity of Data Reviewed Labs: ordered. Radiology: ordered.  Risk Prescription drug management.   79 year old male history of CAD status post PCI, CHF, CKD, hypertension, diabetes, and hyperlipidemia who presents to the emergency department with  dizziness.   Initial Ddx:  Vertigo, TIA, stroke, peripheral vertigo, ICH, MI  MDM/Course:  Patient presents to the emergency department with an episode of vertigo.  Lasted 3 to 4 minutes at 7:30 AM this morning.  Denied any other symptoms with it.  Does have chronic tinnitus.  No hearing changes.  On exam is not in acute distress.  Has a nonfocal neuroexam.  He is able to walk without assistance.  CBC unremarkable.  CMP shows CKD that is consistent with prior.  High-sensitivity troponins 49 and 48 respectively.  Given his CKD suspect that that is likely the cause of his elevation that is not likely due to an MI.  EKG without any concerning findings that would cause dizziness such as Brugada, long QT, or WPW.  Did have a CT angio of his head and neck without any hemodynamically significant stenosis.  Given these findings feel that TIA is highly unlikely.  Suspect he may have had peripheral vertigo.  Will have him follow-up with ENT about this.  Return precautions discussed prior to discharge.  Given a prescription of meclizine  to take   This patient presents to the ED for concern of complaints listed in HPI, this involves an extensive number of treatment options, and is a complaint that carries with it a high risk of complications and morbidity. Disposition including potential need for admission considered.   Dispo: DC Home. Return precautions discussed including, but not limited to, those listed in the AVS. Allowed pt time to ask questions which were answered fully prior to dc.  I have reviewed the patients home medications and made adjustments as needed Records reviewed Outpatient Clinic Notes The following labs were independently interpreted: Chemistry and show CKD I independently reviewed the following imaging with scope of interpretation limited to determining acute life threatening conditions related to emergency care: CT Head and agree with the radiologist interpretation with the following  exceptions: none I personally reviewed and interpreted cardiac monitoring: normal sinus rhythm  I personally reviewed and interpreted the pt's EKG: see above for interpretation   Portions of this note were generated with Dragon dictation software. Dictation errors may occur despite best attempts at proofreading.  Final diagnoses:  Vertigo  Elevated troponin    ED Discharge Orders          Ordered    meclizine  (ANTIVERT ) 12.5 MG tablet  3 times daily PRN        05/12/24 1313               Yolande Lamar BROCKS, MD 05/12/24 2016  "

## 2024-05-16 ENCOUNTER — Ambulatory Visit: Attending: Cardiovascular Disease | Admitting: Cardiovascular Disease

## 2024-05-16 ENCOUNTER — Encounter: Payer: Self-pay | Admitting: Cardiovascular Disease

## 2024-05-16 VITALS — BP 160/66 | HR 72 | Ht 69.0 in | Wt 245.0 lb

## 2024-05-16 DIAGNOSIS — E782 Mixed hyperlipidemia: Secondary | ICD-10-CM

## 2024-05-16 DIAGNOSIS — I2510 Atherosclerotic heart disease of native coronary artery without angina pectoris: Secondary | ICD-10-CM

## 2024-05-16 DIAGNOSIS — G4733 Obstructive sleep apnea (adult) (pediatric): Secondary | ICD-10-CM | POA: Diagnosis not present

## 2024-05-16 DIAGNOSIS — I10 Essential (primary) hypertension: Secondary | ICD-10-CM

## 2024-05-16 MED ORDER — EZETIMIBE 10 MG PO TABS: 10.0000 mg | ORAL_TABLET | Freq: Every day | ORAL | 3 refills | Status: AC

## 2024-05-16 NOTE — Assessment & Plan Note (Signed)
 History of morbid obesity on Mounjaro in the past.  His BMI is 36.  He has gained 20 pounds since I saw him last.

## 2024-05-16 NOTE — Assessment & Plan Note (Signed)
 History of essential hypertension blood pressure measured today at 160/66.  He does say that his blood blood pressure doctors offices are higher than he usually is at home suggesting whitecoat hypertension.  He is on metoprolol , valsartan  and hydrochlorothiazide .

## 2024-05-16 NOTE — Assessment & Plan Note (Signed)
 History of hyperlipidemia on pravastatin  with lipid profile performed 02/17/2024 revealing total cholesterol of 161, LDL 89 and HDL 50, not at goal for secondary prevention.  I am going to add Zetia  and we will recheck a lipid liver profile in 3 months.  LDL goal less than 70.

## 2024-05-16 NOTE — Progress Notes (Signed)
 "     05/16/2024 Kevin [name]   1946-03-19  [id number]  Primary Physician Marvine Rush, MD Primary Cardiologist: Dorn JINNY Lesches MD FACP, [place], [place], [place]  HPI:  Kevin [name] is a 79 y.o.   severely overweight divorced Caucasian male, father of 1 child, who I last saw in the office 06/24/2023. 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 He has a history of CAD, status post remote LAD and circumflex stenting in 2001 and 2004 respectively by Drs. Wolm Nida and Zell Guan. He stopped smoking and drinking at that time as well. His other problems include hypertension, hyperlipidemia, and non-insulin -requiring diabetes. He also has obstructive sleep apnea, on CPAP. He has had lap-banding in the past with ultimate reaccumulation of his weight. He has had endovenous ablation by Dr. Leafy of his left greater saphenous vein, which really did not afford significant clinical improvement. He had a functional study performed December 22, 2008, which was nonischemic, and another one June 24, 2011, because of chest pain, which was low risk. He has been asymptomatic.Dr. Marvine recently checked his lipid profile last week as an outpatient. He was admitted to [facility] last month which has been shortness of breath. He underwent cardiac catheterization by Dr. Esmeralda Smith's revealing patent LAD and circumflex stents and a left dominant system with an occluded nondominant RCA. Medical therapy was recommended. He experimented with stopping his newly added diabetes medicines one of which apparently was contributing to his symptoms. He feels clinically improved after stopping this medication. Since I saw him in March 2016 he was admitted with unstable angina on 02/14/15 and underwent cardiac catheterization by Dr. Dann His LAD stent had diffuse in-stent restenosis with a 95% distal edge stenosis which was restented using a 3.5 x 28 mm long synergy drug-eluting stent. He  did have an ostial diagonal branch stenosis which was jailedand a 75% first obtuse marginal branch stenosis as well as an occluded nondominant RCA.   He was started on Synthroid  at 100 mcg and noticed tachypalpitations.  An event monitor showed occasional PACs, PVCs and short runs of SVT.  His dose was lowered to 50 mcg and his palpitations have resolved.  He had his right shoulder operated on by Dr. Jodie Collet 08/23/2020 which he has recuperated from.  Since I saw him a year ago he continues to do well.  He denies chest pain or shortness of breath.  He has been dealing with prostate cancer.  He also had a small bowel obstruction thought related to his GLP-1.  He was recently seen in the ER with vertigo with negative workup.  His troponins were mildly elevated high 40s.  Active Medications[1]   Allergies[2]  Social History   Socioeconomic History   Marital status: Divorced    Spouse name: Not on file   Number of children: 1   Years of education: 12   Highest education level: 12th grade  Occupational History   Occupation: Manufacturing Engineer: [employer]    Comment: [employer]  Tobacco Use   Smoking status: Former    Current packs/day: 3.00    Types: Cigarettes    Passive exposure: Past   Smokeless tobacco: Former    Types: Snuff, Chew    Quit date: 106   Tobacco comments:    10-15-2023  pt stated quit smoking 1991,  started age 25 (82)  smoked for 25 yrs  Vaping Use   Vaping status: Never Used  Substance and Sexual Activity   Alcohol use: Not Currently    Comment: 3-4 times/year   Drug use: Never   Sexual activity: Not on file  Other Topics Concern   Not on file  Social History Narrative   Not on file   Social Drivers of Health   Tobacco Use: Medium Risk (05/16/2024)   Patient History    Smoking Tobacco Use: Former    Smokeless Tobacco Use: Former    Passive Exposure: Past  Physicist, Medical Strain: Low Risk (08/05/2022)   Overall Financial Resource  Strain (CARDIA)    Difficulty of Paying Living Expenses: Not hard at all  Food Insecurity: No Food Insecurity (12/28/2023)   Epic    Worried About Radiation Protection Practitioner of Food in the Last Year: Never true    Ran Out of Food in the Last Year: Never true  Transportation Needs: No Transportation Needs (12/28/2023)   Epic    Lack of Transportation (Medical): No    Lack of Transportation (Non-Medical): No  Physical Activity: Inactive (08/05/2022)   Exercise Vital Sign    Days of Exercise per Week: 0 days    Minutes of Exercise per Session: 0 min  Stress: No Stress Concern Present (08/05/2022)   [employer] of Occupational Health - Occupational Stress Questionnaire    Feeling of Stress : Only a little  Social Connections: Socially Isolated (12/28/2023)   Social Connection and Isolation Panel    Frequency of Communication with Friends and Family: More than three times a week    Frequency of Social Gatherings with Friends and Family: More than three times a week    Attends Religious Services: Never    Database Administrator or Organizations: No    Attends Banker Meetings: Never    Marital Status: Divorced  Catering Manager Violence: Not At Risk (12/28/2023)   Epic    Fear of Current or Ex-Partner: No    Emotionally Abused: No    Physically Abused: No    Sexually Abused: No  Depression (PHQ2-9): Low Risk (09/15/2023)   Depression (PHQ2-9)    PHQ-2 Score: 0  Alcohol Screen: Low Risk (09/15/2023)   Alcohol Screen    Last Alcohol Screening Score (AUDIT): 1  Housing: Low Risk (12/28/2023)   Epic    Unable to Pay for Housing in the Last Year: No    Number of Times Moved in the Last Year: 0    Homeless in the Last Year: No  Utilities: Not At Risk (12/28/2023)   Epic    Threatened with loss of utilities: No  Health Literacy: Not on file     Review of Systems: General: negative for chills, fever, night sweats or weight changes.  Cardiovascular: negative for chest pain, dyspnea on  exertion, edema, orthopnea, palpitations, paroxysmal nocturnal dyspnea or shortness of breath Dermatological: negative for rash Respiratory: negative for cough or wheezing Urologic: negative for hematuria Abdominal: negative for nausea, vomiting, diarrhea, bright red blood per rectum, melena, or hematemesis Neurologic: negative for visual changes, syncope, or dizziness All other systems reviewed and are otherwise negative except as noted above.    Blood pressure (!) 160/66, pulse 72, height 5' 9 (1.753 m), weight 245 lb (111.1 kg), SpO2 98%.  General appearance: alert and no distress Neck: no adenopathy, no carotid bruit, no JVD, supple, symmetrical, trachea midline, and thyroid  not enlarged, symmetric, no tenderness/mass/nodules Lungs: clear to auscultation bilaterally Heart: regular rate and rhythm,  S1, S2 normal, no murmur, click, rub or gallop Extremities: extremities normal, atraumatic, no cyanosis or edema Pulses: 2+ and symmetric Skin: Skin color, texture, turgor normal. No rashes or lesions Neurologic: Grossly normal  EKG not performed today      ASSESSMENT AND PLAN:   OSA on CPAP Currently not wearing CPAP.  He is going to explore the inspire device with his ENT.  Coronary artery disease, hx LAD and LCX stenting in 2001 & 2004, last cath 2009 with patent stents. History of CAD status post LAD and circumflex stenting in 2001 2004 by Drs. Obie and Bement.  Cath by Dr. Dann 02/14/2015 revealed diffuse in-stent restenosis which was restented in the LAD with a 3.5 x 20 mm long Synergy drug-eluting stent.  He did have a jailed diagonal branch as well as a 75% first obtuse marginal branch stenosis and an occluded nondominant RCA.  He denies chest pain.  Essential hypertension History of essential hypertension blood pressure measured today at 160/66.  He does say that his blood blood pressure doctors offices are higher than he usually is at home suggesting whitecoat  hypertension.  He is on metoprolol , valsartan  and hydrochlorothiazide .  Mixed hyperlipidemia History of hyperlipidemia on pravastatin  with lipid profile performed 02/17/2024 revealing total cholesterol of 161, LDL 89 and HDL 50, not at goal for secondary prevention.  I am going to add Zetia  and we will recheck a lipid liver profile in 3 months.  LDL goal less than 70.  Morbid obesity (HCC) History of morbid obesity on Mounjaro in the past.  His BMI is 36.  He has gained 20 pounds since I saw him last.     Dorn DOROTHA Lesches MD [facility], FSCAI 05/16/2024 2:02 PM    [1]  Current Meds  Medication Sig   amoxicillin -clavulanate (AUGMENTIN ) 500-125 MG tablet Take 1 tablet by mouth every 8 (eight) hours.   Ascorbic Acid (VITAMIN C) 1000 MG tablet Take 1,000 mg by mouth daily.   aspirin  81 MG tablet Take 81 mg by mouth daily.   b complex vitamins capsule Take 1 capsule by mouth daily.   bisacodyl  (DULCOLAX) 10 MG suppository Place 1 suppository (10 mg total) rectally daily.   cetirizine  (ZYRTEC ) 5 MG tablet Take 1 tablet (5 mg total) by mouth daily.   Cholecalciferol (VITAMIN D3) 25 MCG CAPS Take 1 capsule by mouth every other day.   clopidogrel  (PLAVIX ) 75 MG tablet TAKE ONE TABLET BY MOUTH ONCE DAILY.   Coenzyme Q10 (COQ-10) 100 MG CAPS Take 1 capsule by mouth daily.   diphenhydrAMINE  (BENADRYL ) 25 MG tablet Take 25 mg by mouth every 6 (six) hours as needed for itching or allergies.   ezetimibe  (ZETIA ) 10 MG tablet Take 1 tablet (10 mg total) by mouth daily.   Flax Oil-Fish Oil-Borage Oil (FISH OIL-FLAX OIL-BORAGE OIL) CAPS Take 3,000 mg by mouth every morning.   fluticasone  (FLONASE ) 50 MCG/ACT nasal spray Place 2 sprays into both nostrils daily.   JANUVIA 100 MG tablet Take 100 mg by mouth daily.   levothyroxine  (SYNTHROID ) 50 MCG tablet Take 50 mcg by mouth daily before breakfast.   meclizine  (ANTIVERT ) 12.5 MG tablet Take 1 tablet (12.5 mg total) by mouth 3 (three) times daily as  needed for dizziness.   metoprolol  (TOPROL -XL) 100 MG 24 hr tablet Take 50-100 mg by mouth 2 (two) times daily. Take 1 (100 mg) tablet in the morning and 1/2 tablet (50 mg) every evening   Misc Natural Products (TURMERIC CURCUMIN) CAPS  Take 1 capsule by mouth every morning.   Naphazoline-Pheniramine (EYE ALLERGY  RELIEF OP) Place 2 drops into both eyes as needed.   niacin  (NIASPAN ) 1000 MG CR tablet TAKE 1 TABLET BY MOUTH AT BEDTIME.   ondansetron  (ZOFRAN ) 4 MG tablet Take 1 tablet (4 mg total) by mouth every 6 (six) hours as needed for nausea.   pravastatin  (PRAVACHOL ) 40 MG tablet Take 1 tablet (40 mg total) by mouth every evening. (Patient taking differently: Take 40 mg by mouth every evening.)   tamsulosin  (FLOMAX ) 0.4 MG CAPS capsule Take 0.4 mg by mouth every evening.   tirzepatide (MOUNJARO) 2.5 MG/0.5ML Pen Inject 2.5 mg into the skin once a week.   valsartan -hydrochlorothiazide  (DIOVAN -HCT) 160-12.5 MG tablet Take 1 tablet by mouth daily.  [2]  Allergies Allergen Reactions   Irbesartan  Shortness Of Breath and Other (See Comments)   Prednisone  Anaphylaxis, Swelling and Other (See Comments)    Swelling of the lips   Ramipril Shortness Of Breath and Other (See Comments)   Shellfish Allergy  Anaphylaxis    ALL SHELLFISH  Shrimp/  crab   Tradjenta [Linagliptin] Shortness Of Breath   Cephalexin Hives and Swelling   Clindamycin Hcl Hives and Swelling   Codeine Nausea And Vomiting and Other (See Comments)    Affects heart   Contrast Media [Iodinated Contrast Media] Other (See Comments)    Very sick  Can be administered if Benadryl  is administered prior to prevent reaction    Fenofibrate Other (See Comments)    My skin started peeling off   Glycotrol [Support-500] Other (See Comments)    Unknown    Indomethacin Other (See Comments)    Unknown    Minocycline Hcl Hives and Swelling   Morphine And Codeine Nausea And Vomiting    Projectile vomiting   Sulfa Drugs Cross Reactors Hives  and Swelling   Tetracyclines & Related Other (See Comments)    When exposed to sun, skin turned bluish   "

## 2024-05-16 NOTE — Assessment & Plan Note (Signed)
 Currently not wearing CPAP.  He is going to explore the inspire device with his ENT.

## 2024-05-16 NOTE — Assessment & Plan Note (Signed)
 History of CAD status post LAD and circumflex stenting in 2001 2004 by Drs. Obie and Aldan.  Cath by Dr. Dann 02/14/2015 revealed diffuse in-stent restenosis which was restented in the LAD with a 3.5 x 20 mm long Synergy drug-eluting stent.  He did have a jailed diagonal branch as well as a 75% first obtuse marginal branch stenosis and an occluded nondominant RCA.  He denies chest pain.

## 2024-05-16 NOTE — Patient Instructions (Signed)
 Medication Instructions:  Your physician has recommended you make the following change in your medication:   -Start taking ezetimibe  (zetia ) 10mg  once daily.  *If you need a refill on your cardiac medications before your next appointment, please call your pharmacy*  Lab Work: Your physician recommends that you return for lab work in: 3 months for FASTING Lipid/liver panel  If you have labs (blood work) drawn today and your tests are completely normal, you will receive your results only by: MyChart Message (if you have MyChart) OR A paper copy in the mail If you have any lab test that is abnormal or we need to change your treatment, we will call you to review the results.   Follow-Up: At [facility], you and your health needs are our priority.  As part of our continuing mission to provide you with exceptional heart care, our providers are all part of one team.  This team includes your primary Cardiologist (physician) and Advanced Practice Providers or APPs (Physician Assistants and Nurse Practitioners) who all work together to provide you with the care you need, when you need it.  Your next appointment:   6 month(s)  Provider:   Lum Louis, NP, Callie Goodrich, PA-C, Hao Meng, PA-C, or Katlyn West, NP         Then, Dorn Lesches, MD will plan to see you again in 12 month(s).     We recommend signing up for the patient portal called MyChart.  Sign up information is provided on this After Visit Summary.  MyChart is used to connect with patients for Virtual Visits (Telemedicine).  Patients are able to view lab/test results, encounter notes, upcoming appointments, etc.  Non-urgent messages can be sent to your provider as well.   To learn more about what you can do with MyChart, go to [email or web address].   Other Instructions

## 2024-06-20 ENCOUNTER — Inpatient Hospital Stay: Admitting: *Deleted
